# Patient Record
Sex: Female | Born: 1950
Health system: Southern US, Community
[De-identification: ages and names within clinical notes are randomized; demographics above are authoritative.]

## PROBLEM LIST (undated history)

## (undated) DIAGNOSIS — Z91018 Allergy to other foods: Secondary | ICD-10-CM

## (undated) DIAGNOSIS — F419 Anxiety disorder, unspecified: Secondary | ICD-10-CM

## (undated) DIAGNOSIS — I10 Essential (primary) hypertension: Secondary | ICD-10-CM

## (undated) DIAGNOSIS — J45909 Unspecified asthma, uncomplicated: Secondary | ICD-10-CM

## (undated) DIAGNOSIS — I2510 Atherosclerotic heart disease of native coronary artery without angina pectoris: Secondary | ICD-10-CM

## (undated) DIAGNOSIS — F32A Depression, unspecified: Secondary | ICD-10-CM

## (undated) DIAGNOSIS — F329 Major depressive disorder, single episode, unspecified: Secondary | ICD-10-CM

## (undated) DIAGNOSIS — M1990 Unspecified osteoarthritis, unspecified site: Secondary | ICD-10-CM

## (undated) DIAGNOSIS — K219 Gastro-esophageal reflux disease without esophagitis: Secondary | ICD-10-CM

## (undated) DIAGNOSIS — L509 Urticaria, unspecified: Secondary | ICD-10-CM

## (undated) DIAGNOSIS — R748 Abnormal levels of other serum enzymes: Secondary | ICD-10-CM

## (undated) DIAGNOSIS — C649 Malignant neoplasm of unspecified kidney, except renal pelvis: Secondary | ICD-10-CM

## (undated) DIAGNOSIS — E785 Hyperlipidemia, unspecified: Secondary | ICD-10-CM

## (undated) DIAGNOSIS — T7840XA Allergy, unspecified, initial encounter: Secondary | ICD-10-CM

## (undated) DIAGNOSIS — I070 Rheumatic tricuspid stenosis: Secondary | ICD-10-CM

## (undated) DIAGNOSIS — K635 Polyp of colon: Secondary | ICD-10-CM

## (undated) HISTORY — DX: Major depressive disorder, single episode, unspecified: F32.9

## (undated) HISTORY — PX: CYST REMOVAL TRUNK: SHX6283

## (undated) HISTORY — DX: Malignant neoplasm of unspecified kidney, except renal pelvis: C64.9

## (undated) HISTORY — PX: HERNIA REPAIR: SHX51

## (undated) HISTORY — DX: Allergy to other foods: Z91.018

## (undated) HISTORY — DX: Depression, unspecified: F32.A

## (undated) HISTORY — DX: Unspecified osteoarthritis, unspecified site: M19.90

## (undated) HISTORY — DX: Abnormal levels of other serum enzymes: R74.8

## (undated) HISTORY — DX: Polyp of colon: K63.5

## (undated) HISTORY — DX: Atherosclerotic heart disease of native coronary artery without angina pectoris: I25.10

## (undated) HISTORY — PX: CHOLECYSTECTOMY: SHX55

## (undated) HISTORY — PX: OTHER SURGICAL HISTORY: SHX169

## (undated) HISTORY — DX: Anxiety disorder, unspecified: F41.9

## (undated) HISTORY — PX: TUBAL LIGATION: SHX77

## (undated) HISTORY — PX: ABDOMINAL HYSTERECTOMY: SHX81

## (undated) HISTORY — DX: Unspecified asthma, uncomplicated: J45.909

## (undated) HISTORY — DX: Hyperlipidemia, unspecified: E78.5

## (undated) HISTORY — DX: Allergy, unspecified, initial encounter: T78.40XA

## (undated) HISTORY — DX: Essential (primary) hypertension: I10

## (undated) HISTORY — DX: Urticaria, unspecified: L50.9

## (undated) HISTORY — DX: Gastro-esophageal reflux disease without esophagitis: K21.9

## (undated) HISTORY — DX: Rheumatic tricuspid stenosis: I07.0

## (undated) HISTORY — PX: POLYPECTOMY: SHX149

---

## 1990-01-16 HISTORY — PX: NISSEN FUNDOPLICATION: SHX2091

## 2000-04-16 ENCOUNTER — Ambulatory Visit (HOSPITAL_COMMUNITY): Admission: RE | Admit: 2000-04-16 | Discharge: 2000-04-16 | Payer: Self-pay | Admitting: Gastroenterology

## 2003-07-13 ENCOUNTER — Ambulatory Visit (HOSPITAL_COMMUNITY): Admission: RE | Admit: 2003-07-13 | Discharge: 2003-07-13 | Payer: Self-pay | Admitting: Family Medicine

## 2003-10-21 ENCOUNTER — Ambulatory Visit (HOSPITAL_COMMUNITY): Admission: RE | Admit: 2003-10-21 | Discharge: 2003-10-21 | Payer: Self-pay | Admitting: Family Medicine

## 2003-10-22 ENCOUNTER — Ambulatory Visit (HOSPITAL_COMMUNITY): Admission: RE | Admit: 2003-10-22 | Discharge: 2003-10-22 | Payer: Self-pay | Admitting: Family Medicine

## 2003-11-25 ENCOUNTER — Ambulatory Visit (HOSPITAL_COMMUNITY): Admission: RE | Admit: 2003-11-25 | Discharge: 2003-11-25 | Payer: Self-pay | Admitting: Internal Medicine

## 2004-05-25 ENCOUNTER — Ambulatory Visit (HOSPITAL_COMMUNITY): Admission: RE | Admit: 2004-05-25 | Discharge: 2004-05-25 | Payer: Self-pay | Admitting: *Deleted

## 2004-08-24 ENCOUNTER — Encounter: Admission: RE | Admit: 2004-08-24 | Discharge: 2004-08-24 | Payer: Self-pay | Admitting: Cardiology

## 2005-01-30 ENCOUNTER — Ambulatory Visit: Payer: Self-pay | Admitting: Internal Medicine

## 2005-02-07 ENCOUNTER — Encounter: Admission: RE | Admit: 2005-02-07 | Discharge: 2005-02-07 | Payer: Self-pay | Admitting: *Deleted

## 2005-02-08 ENCOUNTER — Ambulatory Visit (HOSPITAL_COMMUNITY): Admission: RE | Admit: 2005-02-08 | Discharge: 2005-02-08 | Payer: Self-pay | Admitting: Internal Medicine

## 2005-02-08 ENCOUNTER — Ambulatory Visit: Payer: Self-pay | Admitting: Internal Medicine

## 2005-02-08 HISTORY — PX: COLONOSCOPY: SHX174

## 2005-02-09 ENCOUNTER — Ambulatory Visit (HOSPITAL_COMMUNITY): Admission: RE | Admit: 2005-02-09 | Discharge: 2005-02-09 | Payer: Self-pay | Admitting: Internal Medicine

## 2005-03-28 ENCOUNTER — Ambulatory Visit: Payer: Self-pay | Admitting: Internal Medicine

## 2005-08-08 ENCOUNTER — Encounter: Admission: RE | Admit: 2005-08-08 | Discharge: 2005-08-08 | Payer: Self-pay | Admitting: *Deleted

## 2005-10-11 ENCOUNTER — Ambulatory Visit (HOSPITAL_BASED_OUTPATIENT_CLINIC_OR_DEPARTMENT_OTHER): Admission: RE | Admit: 2005-10-11 | Discharge: 2005-10-11 | Payer: Self-pay | Admitting: Orthopedic Surgery

## 2006-05-24 ENCOUNTER — Encounter: Admission: RE | Admit: 2006-05-24 | Discharge: 2006-05-24 | Payer: Self-pay | Admitting: *Deleted

## 2007-06-10 ENCOUNTER — Ambulatory Visit: Payer: Self-pay | Admitting: Internal Medicine

## 2007-06-10 ENCOUNTER — Inpatient Hospital Stay (HOSPITAL_COMMUNITY): Admission: EM | Admit: 2007-06-10 | Discharge: 2007-06-12 | Payer: Self-pay | Admitting: Emergency Medicine

## 2007-06-28 ENCOUNTER — Encounter: Admission: RE | Admit: 2007-06-28 | Discharge: 2007-06-28 | Payer: Self-pay | Admitting: *Deleted

## 2008-09-23 ENCOUNTER — Encounter: Admission: RE | Admit: 2008-09-23 | Discharge: 2008-09-23 | Payer: Self-pay | Admitting: *Deleted

## 2009-10-08 ENCOUNTER — Encounter: Admission: RE | Admit: 2009-10-08 | Discharge: 2009-10-08 | Payer: Self-pay | Admitting: *Deleted

## 2010-05-31 NOTE — Discharge Summary (Signed)
NAMEMarland Kitchen  Leah [name], Leah [name] NO.:  [id number]   MEDICAL RECORD NO.:  [id number]          PATIENT TYPE:  INP   LOCATION:  5014                         FACILITY:  [facility]   PHYSICIAN:  Madaline Guthrie, M.D.    DATE OF BIRTH:  Aug 28, 1950   DATE OF ADMISSION:  06/10/2007  DATE OF DISCHARGE:  06/12/2007                               DISCHARGE SUMMARY   DISCHARGE DIAGNOSES:  1. Lower gastrointestinal bleeding in the background of internal      hemorrhoids, diverticulosis, and chronic constipation with      otherwise normal colonoscopy (in January 2007)  2. History of asthma.  3. Hyperlipidemia.  4. Hypertension.  5. Gastroesophageal reflux disease.  6. History of hysterectomy.  7. History of cholecystectomy.  8. History of rectal fistula repair.  9. History of coccydynia status post manipulation and injection in      September 2007.  10.History of Nissen fundoplication in 1994.  11.History of right carpal tunnel release.  12.History of hernia repair with mesh in 2007.  13.Hypokalemia, resolved.  14.Liver enzyme dysfunction, resolved with negative acute hepatic      panel.  15.Vasovagal, situational syncope.   DISCHARGE MEDICATIONS:  1. MiraLax 17 grams daily.  2. Aspirin 81 mg daily.  3. Protonix 40 mg daily.  4. Crestor 40 mg daily.   DISPOSITION AND FOLLOWUP:  The patient is sent home in a stable  condition.  The patient will follow with [facility] on June 11 at 3:15.  At the followup visit, the patient will be  reviewed for resolution of her bleeding problem.  A repeat CBC and BMET  is advised at the followup visit.  The patient was noted to have  borderline hypokalemia on the day of discharge and this needs to be  checked.  A repeat liver function test is also recommended at the  followup visit as the patient was found to have transient elevation of  her liver enzymes which were trending down.   PROCEDURES:  CT angiogram of abdomen  and pelvis, Jun 11, 2007.   FINDINGS:  1. Diffuse low density wall thickening involving the descending colon      compatible with colitis.  This could be infectious, inflammatory,      or ischemic in nature.  2. A 2.1 cm ventral hernia defect with herniated fat measuring 7 x 2.5      cm.  3. Stable proximal gastric wall thickening.  4. No acute pelvic abnormality.   CONSULTATIONS:  Iva Boop, MD, [facility].   ADMISSION HISTORY:  Ms. Kerwin is a 60 year old woman with past medical  history of chronic constipation, GERD, history of rectal fistula repair,  history of Nissen fundoplication, and history of esophageal polyp  removed about 3 months ago.  She presented to ED complaining of rectal  bleeding and syncopal episode.  She mentioned of constipation for the  past 3-4 days prior to admission.  She had an urge to defecate after she  took Dulcolax early in the morning on the day of admission.  She was  unable  to defecate and she had to push and strain hard and at that  moment, she felt lightheadedness, diaphoresis, and abdominal pain.  Right after defecation patient called her husband and subsequently  passed out in his arms. The EMS was called and she was asked to try  enema.  After that, she had a bowel movement but noticed bright red  bleeding per rectum of about half a cup.  She continued to have a total  of 11 bowel motions containing blood and clots.  She mentioned [place] that  given the pain started 2 days prior to admission, it was crampy  bilateral lower quadrant pain, intermittent in nature, alleviated by  bowel motion.  She also mentioned of epigastric pain aggravated by food  and burning sensation.   ADMISSION PHYSICAL EXAMINATION:  VITAL SIGNS:  Temperature 98.8, blood  pressure 150/91, pulse 73, respiratory rate 18, oxygen saturation 100%  on room air.  GENERAL:  Uncomfortable, morbidly obese.  EYES:  PERRLA, intact range of motion.  ENT:  Moist  mucous.  NECK:  No JVD.  No thyromegaly.  CHEST:  Clear to auscultation bilaterally.  No crackles.  CARDIOVASCULAR:  Normal heart sounds with no murmur, regular rate and  rhythm.  ABDOMEN:  Bowel sounds positive, no distention, obese, vertical midline  scar, and laparoscopic cholecystectomy scar.  Minimal diffuse upper  quadrant tenderness with moderate bilateral lower quadrant tenderness to  palpation with no rebound tenderness, no rigidity, or guarding.  EXTREMITIES:  No edema.  GENITOURINARY:  Positive suprapubic tenderness.  SKIN:  Clear but clammy.  NEURO:  Alert and oriented x3.  Cranial nerves III-XII intact.  Some  nystagmus on lateral gaze.  Slight past-pointing on cerebellar exam.  PSYCH:  Alert and oriented x3, pleasant.   ADMISSION LABORATORY DATA:  Sodium 136, potassium 3.5, chloride 99,  bicarbonate 30, BUN 11, creatinine 0.9, blood glucose 101, calcium 9.5,  ALT 36, AST 27, protein 6.8, albumin 3.8.  Hemoglobin 13.3, platelet  289, WBC 8.5.  INR 0.9.  PT 12.1.  APTT 28.  Urinalysis negative.   HOSPITAL COURSE:  1. Bright red bleeding per rectum.  The patient was admitted for      observation and further workup on her problem.  She was placed      n.p.o., given IV fluids and p.r.n., and analgesics.  Her hemoglobin      and hematocrit were monitored closely.  It did drop down to 11.6,      but then stabilized.  A CT angio of the abdomen was obtained as      mentioned above to rule out any ongoing acute abdominal issues,      including ischemic colitis.  Gastroenterology consult was made with      Dr. Leone Payor who was of the opinion that the patient's bleeding per      rectum was most likely related to the problem of chronic      constipation and straining and no further investigations were      recommended at this time.  Upon Dr. Marvell Fuller recommendation, the      patient was started on MiraLax.  The patient remained stable during      her hospital stay.  She did pass a  few bright red and blackish      colored stools, but it was subsiding and her pain in abdomen had      greatly diminished by day 2.  2. Syncope.  This was most likely vasovagal syncope  related to      situation.  This occurred following defecation and hence most      likely is a defecation related situational syncope.  We simply      monitored her in the hospital and placed her on telemetry.  She      remained stable in terms of this particular problem.  Her cardiac      enzymes were negative x2 and there were no acute changes on her      electrocardiogram.  3. Transient elevation in liver enzymes.  We simply monitored her      liver functions and obtained an acute hepatitis panel which came      out to be negative.  4. Hypertension.  The patient's blood pressure medication was      initially placed on hold and restarted upon discharge.  5. Hyperlipidemia.  The patient's regular medication of statin was      continued.  Initially she was given Zocor but then after noticing      high liver enzymes, it was changed to her regular medicine of      Crestor.  This can be monitored on outpatient basis.  6. Gastroesophageal reflux disease.  Her regular PPI was continued.  7. Asthma.  This was stable.  She was on replaced on p.r.n. meds.   DISCHARGE LABORATORY DATA:  Hemoglobin 11.6, WBC 5.6, MCV 81.8, platelet  220.  Sodium 142, potassium 3.2, chloride 105, bicarbonate 27, glucose  98, BUN 10, creatinine 0.94.  Bilirubin 0.4, alk phos 70, AST 38, ALT  70, protein 5.7, albumin 2.9, calcium 8.5.   DISCHARGE VITAL SIGNS:  Temperature 99.2, pulse 80, respirations 20,  blood pressure 113/74, saturation 98% on room air.   On the day of discharge, the patient's bleeding problem had greatly  subsided with passage of minimal blackish stool.  Her abdomen  examination was completely benign and she was no longer complaining of  any abdominal pain.      Zara Council, MD  Electronically  Signed      Madaline Guthrie, M.D.  Electronically Signed    AS/MEDQ  D:  06/13/2007  T:  06/13/2007  Job:  [id number]   cc:   Iva Boop, MD,FACG

## 2010-06-03 NOTE — Op Note (Signed)
NAME:  Leah [name], Leah [name] NO.:  [id number]   MEDICAL RECORD NO.:  [id number]          PATIENT TYPE:  AMB   LOCATION:  DAY                           FACILITY:  [facility]   PHYSICIAN:  R. Roetta Sessions, M.D. DATE OF BIRTH:  1950/09/16   DATE OF PROCEDURE:  02/08/2005  DATE OF DISCHARGE:                                 OPERATIVE REPORT   PROCEDURE:  Diagnostic colonoscopy.   ENDOSCOPIST:  Gerrit Friends. Rourk, M.D.   INDICATIONS FOR PROCEDURE:  The patient is a 60 year old lady with pain just  below the tip of her coccyx.  She is chronically constipated.  She has never  had her lower GI tract imaged.  MRI of his coccyx is reportedly negative  recently.  Colonoscopy is now being done.  This approach has been discussed  with the patient at length.  The potential risks, benefits, and alternatives  have been reviewed; questions answered.  She is agreeable. Please see the  documentation in the medical record.   PROCEDURE NOTE:  O2 saturation, blood pressure, pulse and respirations were  monitored throughout the entire procedure.   CONSCIOUS SEDATION:  Versed 3 mg IV, Demerol 75 mg IV in divided doses.   INSTRUMENT:  Olympus video chip system.   FINDINGS:  A digital rectal exam revealed a fairly good sphincter tone.  No  mass in the rectal vault, no unusual tenderness or abnormality.   ENDOSCOPIC FINDINGS:  The prep was good.   RECTUM:  Examination of the rectal mucosa including the retroflex view of  the anal verge revealed no abnormalities.   COLON:  The colonic mucosa was surveyed from the rectosigmoid junction  through the left transverse and right colon to the area of the appendiceal  orifice, ileocecal valve, and cecum.  These structures were well seen and  photographed for the record.   From this level the scope was slowly and cautiously withdrawn.  All  previously mentioned mucosal surfaces were again seen. The patient had a few  scattered, shallow sigmoid  diverticula.  The remainder of the colonic mucosa  appeared normal.  The patient tolerated the procedure well and was reacted  in endoscopy.  Cecal withdrawal time 8 minutes.   IMPRESSION:  1.  Internal hemorrhoids otherwise normal rectum.  2.  A few scattered, shallow, left-sided sigmoid diverticula.  3.  The remainder of the colonic mucosa appeared normal.   RECOMMENDATIONS:  Provide Ms. Towns with constipation literature since she  has had problems with that for some time.  Recommended a daily fiber  supplement.  Crystalase laxative 20 gm orally at bed time to see if this  helps her constipation.  We would like to see it would help her perianal  pain as well.  Will go ahead and give her a brief of __________  forte cream  to apply to the anorectum t.i.d., also to see if this makes any difference.  I think that we should go ahead and proceed with abdominal pelvic CT just to  make sure that she does not have an occult cyst or a smoldering abscess  (both  of which I think are less likely).  Will make further recommendations  in the very near future.      Jonathon Bellows, M.D.  Electronically Signed     RMR/MEDQ  D:  02/08/2005  T:  02/08/2005  Job:  [id number]   cc:   Smitty Cords L. Bess Harvest, D.O.  [place]

## 2010-06-03 NOTE — Op Note (Signed)
NAME:  Leah [name], Leah [name] NO.:  [id number]   MEDICAL RECORD NO.:  [id number]          PATIENT TYPE:  AMB   LOCATION:  DSC                          FACILITY:  [facility]   PHYSICIAN:  Harvie Junior, M.D.   DATE OF BIRTH:  [birth year]   DATE OF PROCEDURE:  DATE OF DISCHARGE:                                 OPERATIVE REPORT   DATE OF SURGERY:  10/11/2005   PREOPERATIVE DIAGNOSES:  1. Carpal tunnel syndrome, right.  2. Coccydynia.   POSTOPERATIVE DIAGNOSIS:  1. Carpal tunnel syndrome, right.  2. Coccydynia.   PROCEDURE:  1. Right carpal tunnel release.  2. Manipulation of coccyx manually through the rectum.  3. Injection into the coccyx area with aid of Xylocaine and 2 mL of Depo-      Medrol.   SURGEON:  Harvie Junior, M.D.   ASSISTANT:  Marshia Ly, P.A.   ANESTHESIA:  General.   BRIEF HISTORY:  Leah [name] is a 60 year old female with a long history of  having bilateral carpal tunnel syndrome diagnosed by EMG.  She ultimately  was evaluated and felt to need carpal tunnel release.  In preoperative  discussions, the patient had also discussed having a 2 year history of  coccyx pain and we discussed treatment options including injection.  She  ultimately felt that she would like some treatment for this and, give that  we were going to have her asleep for surgery, we felt that injection with  manipulation would be appropriate if we could do the most we could to try to  alleviate her symptoms in that area.  She was brought to the operating room  for that procedure.   PROCEDURE:  The patient brought to the operating room. After adequate  anesthesia was obtained with general anesthetic, the patient placed supine  on the operating table.  She was then rolled in the left lateral decubitus  position.  At this point Surg-I-Loop was used and a rectal examination was  performed with getting up to the area of the coccyx.  Once we were able to  get to the area of the  coccyx, from inside the rectum and outside the skin,  we were able to manipulate the coccyx.  It was very stiff and really had  quite little mobility. We provided additional pressure to see if we could  not get that to move and we were moderately successful with that.  At that  point, this portion of the procedure was halted.  The patient was wiped and  cleaned and the area that was marked preoperatively as her area of maximal  tenderness was then prepped thoroughly and 6 mL of Marcaine and 2 mL of Depo-  Medrol were placed in this area.  We used a spinal needle and got right down  on the coccyx and you could feel the bony area in that area and then did the  injection right along the posterior aspect of the coccyx.   Once this was accomplished, this area was cleansed as well and the patient  was then rolled onto her back and  a right carpal tunnel release was  performed by placing a forearm tourniquet, prepping and draping the hand.  Then at this point a small incision was made just ulnar to the midline  wrist.  Subcutaneous tissue was dissected down to the volar carpal ligament.  The volar carpal ligament is clearly identified and then divided both  proximally and distally.  There was a tremendous amount of synovial tissue  and the median nerve did seem to be somewhat crowded.  At this point  somewhat of a synovectomy was undertaken to allow the nerve to be freed up.  This gave excellent room for the nerve at this point.  At this point the  wound was copiously irrigated and suctioned dry.  Gloved finger was placed  in the wound proximally and distally ensuring that the nerve was freed up.  After this, the hand wound was closed with interrupted and running suture.  A sterile compressive dressing was applied as well as a volar plaster. The  patient was taken to the recovery room where she was noted to be in  satisfactory condition.  The estimated blood loss for the procedure was   none.      Harvie Junior, M.D.  Electronically Signed     JLG/MEDQ  D:  10/11/2005  T:  10/11/2005  Job:  [id number]

## 2010-06-03 NOTE — Consult Note (Signed)
NAME:  Leah [name], Leah [name] NO.:  [id number]   MEDICAL RECORD NO.:  [id number]          PATIENT TYPE:  AMB   LOCATION:  DAY                           FACILITY:  [facility]   PHYSICIAN:  R. Roetta Sessions, M.D. DATE OF BIRTH:  03/16/1950   DATE OF CONSULTATION:  01/30/2005  DATE OF DISCHARGE:                                   CONSULTATION   REASON FOR CONSULTATION:  Pain in the perirectal area.   HISTORY:  This patient is a pleasant 60 year old African-American female  sent to me by Dr. Smitty Cords L. [place] to further  evaluate the above-mentioned symptoms.  The patient tells me that she has  had just above her anus at the end of her tail bone for approximately 1 year  ago. It has been insidiously progressive.  She has to use a donut cushion  even to drive at this time. She has never had any discharge from the skin  from around her anus.  There has been no history of trauma.  She did undergo  an MRI of her sacrum and coccyx which reportedly was negative (done in  [place]).  I do have a CD-ROM of that study to review.   This patient is chronically constipated.  She may have a bowel movement  every third day or so.  Often times she never gets an urge to have a good  bowel movement and just has to go to the toilet to try and evacuate.  She  has never passed any blood per rectum.  She does not have any itching,  burning, or lancinating pain at the time of a bowel movement.  The pain  above her anus does not worsen with a bowel movement.  She has never had her  lower GI tract imaged in terms of air-contrast barium enema or colonoscopy.  There is no family history of colorectal neoplasia.   She has not had any associated abdominal pain.  There has been no change in  her weight.  She has some intermittent gastroesophageal reflux disease  symptoms for which she takes Nexium in spite of having a Nissen  fundoplication in [place] back in 1991.  She  rarely has dysphagia to  solids.   PAST MEDICAL HISTORY:  1.  Significant for asthma.  2.  Hypertension.  3.  History of GERD.  4.  Hyperlipidemia.   PAST SURGERY:  1.  Hysterectomy.  2.  Nissen fundoplication.  3.  Cholecystectomy.  4.  She tells me that she had a rectal fistula repair years ago, as well.   CURRENT MEDICATIONS:  1.  Lisinopril 20 mg daily.  2.  Black coat hash once daily.  3.  ASA 81 mg daily.  4.  Garlic once daily.  5.  Crestor 20 mg daily.  6.  Nexium 40 mg p.r.n.   ALLERGIES:  No known drug allergies.   FAMILY HISTORY:  Father died at age 47 with some type of GI problem, but  there was no malignancy.  Mother is alive in good health.  Two sisters and  two  brothers alive and in good health.   SOCIAL HISTORY:  The patient is married, has three children.  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  She stopped smoking  10 years ago.  She rarely consumes a glass of wine.  She was born in  [place], [place], lived most of her life in [place], moved to  [place], and finally relocated to [place], and subsequently [place],  [place] where the pace was slower.  There is no history of illicit  drug use.   REVIEW OF SYSTEMS:  No chest pain, no dyspnea on exertion.  Occasional  reflux symptoms.  Rare esophageal dysphagia.  Status post Nissen  fundoplication. No fever or chills.   PHYSICAL EXAMINATION:  GENERAL:  Reveals a pleasant, 60 year old lady  resting comfortably.  VITAL SIGNS:  Weight 196.5, height 5 feet 3-1/2 inches.  Temperature 97.7,  BP 126/86, pulse 72.  SKIN:  Warm and dry.  HEENT:  No scleral icterus.  NECK:  JVD is not prominent or __________ lesions.  CHEST:  Lungs are clear to auscultation.  CARDIAC:  Regular rate and rhythm without murmur, gallop, or rub.  BREASTS EXAM:  Deferred.  ABDOMEN:  Obese.  Laparotomy scar is well healed.  Positive bowel sounds.  Soft, nontender, without appreciable  mass or organomegaly.  EXTREMITIES:  No edema.  RECTAL:  On examination of the buttock and sacral area I do not see any  cutaneous abnormalities.  She does point to just above her anus as an area  of point tenderness.  This is just below the tip of her coccyx.  Her coccyx,  itself does not appear to be tender to me.  A digital rectal exam revealed  quite a bit of stool in the rectal vault (patient does not have an urge to  evacuate currently).  Fair sphincter tone.  No appreciable mass in the  rectal vault.  Brown stool is Hemoccult negative.  It is notable that  palpation of the superior anal/rectal wall is a bit more tender than other  quadrants.   IMPRESSION:  This patient is a 60 year old lady with a 1-year history of  perianal coccygeal pain.  Reportedly MRI of her coccyx and sacrum are  negative.  She has a history of chronic constipation and does appear to have  soft tissue, point tenderness on the superior aspect of the anus.  I do not  appreciate any fluctuance or mass to suggest a cyst or abscess.   Her distant history of having a rectal fistula repair is interesting.   Although her symptoms are somewhat atypical she may have an occult fissure  to account for her symptoms, and I suspect an occult cyst is not excluded at  this time.  She has never had her lower GI tract imaged.   I told the patient that at this point we would just go ahead and plan to  directly visualize her rectum with colonoscopy as the next maneuver.  The  potential risks, benefits, and alternatives have been reviewed.  I told the  patient that if the colonoscopy was unrevealing for her abdominal pain she  may need further evaluation in the way of a pelvic CT or  even possibly a rectal ultrasound in the very near future.  I would like to  thank Dr. Rockwell Germany over in [place], [place] for allowing me to see this very nice lady today.  We will get back with him once more  information has been  gathered.      Jonathon Bellows, M.D.  Electronically Signed     RMR/MEDQ  D:  01/30/2005  T:  01/30/2005  Job:  [id number]   cc:   Smitty Cords L. Bess Harvest, D.O.  [place]   [place]

## 2010-10-12 LAB — DIFFERENTIAL
Basophils Absolute: 0
Basophils Absolute: 0
Basophils Relative: 0
Basophils Relative: 1
Eosinophils Absolute: 0
Eosinophils Absolute: 0.1
Eosinophils Relative: 0
Eosinophils Relative: 1
Lymphocytes Relative: 10 — ABNORMAL LOW
Lymphocytes Relative: 25
Lymphs Abs: 0.8
Lymphs Abs: 1.5
Monocytes Absolute: 0.4
Monocytes Absolute: 0.5
Monocytes Relative: 6
Monocytes Relative: 6
Neutro Abs: 4.1
Neutro Abs: 7.1
Neutrophils Relative %: 68
Neutrophils Relative %: 83 — ABNORMAL HIGH

## 2010-10-12 LAB — TYPE AND SCREEN
ABO/RH(D): O NEG
Antibody Screen: NEGATIVE

## 2010-10-12 LAB — CK TOTAL AND CKMB (NOT AT ARMC)
CK, MB: 1
Relative Index: 0.8
Total CK: 133

## 2010-10-12 LAB — CBC
HCT: 34.3 — ABNORMAL LOW
HCT: 36.9
HCT: 37.2
HCT: 39
HCT: 39.5
Hemoglobin: 11.6 — ABNORMAL LOW
Hemoglobin: 12.5
Hemoglobin: 12.5
Hemoglobin: 13.1
Hemoglobin: 13.3
MCHC: 33.5
MCHC: 33.6
MCHC: 33.7
MCHC: 33.8
MCHC: 33.9
MCV: 81.4
MCV: 81.5
MCV: 81.8
MCV: 81.9
MCV: 82.1
Platelets: 220
Platelets: 228
Platelets: 246
Platelets: 264
Platelets: 289
RBC: 4.19
RBC: 4.54
RBC: 4.55
RBC: 4.75
RBC: 4.84
RDW: 13.7
RDW: 13.8
RDW: 13.8
RDW: 13.9
RDW: 14
WBC: 4.9
WBC: 5.6
WBC: 6
WBC: 7.3
WBC: 8.5

## 2010-10-12 LAB — HEPATITIS PANEL, ACUTE
HCV Ab: NEGATIVE
Hep A IgM: NEGATIVE
Hep B C IgM: NEGATIVE
Hepatitis B Surface Ag: NEGATIVE

## 2010-10-12 LAB — URINALYSIS, ROUTINE W REFLEX MICROSCOPIC
Bilirubin Urine: NEGATIVE
Glucose, UA: NEGATIVE
Hgb urine dipstick: NEGATIVE
Ketones, ur: NEGATIVE
Nitrite: NEGATIVE
Protein, ur: NEGATIVE
Specific Gravity, Urine: 1.006
Urobilinogen, UA: 0.2
pH: 7

## 2010-10-12 LAB — LIPID PANEL
Cholesterol: 132
HDL: 38 — ABNORMAL LOW
LDL Cholesterol: 74
Total CHOL/HDL Ratio: 3.5
Triglycerides: 99
VLDL: 20

## 2010-10-12 LAB — COMPREHENSIVE METABOLIC PANEL
ALT: 108 — ABNORMAL HIGH
ALT: 36 — ABNORMAL HIGH
ALT: 70 — ABNORMAL HIGH
AST: 124 — ABNORMAL HIGH
AST: 27
AST: 38 — ABNORMAL HIGH
Albumin: 2.9 — ABNORMAL LOW
Albumin: 3.3 — ABNORMAL LOW
Albumin: 3.8
Alkaline Phosphatase: 70
Alkaline Phosphatase: 81
Alkaline Phosphatase: 84
BUN: 10
BUN: 11
BUN: 9
CO2: 27
CO2: 30
CO2: 30
Calcium: 8.5
Calcium: 9.1
Calcium: 9.5
Chloride: 102
Chloride: 105
Chloride: 99
Creatinine, Ser: 0.9
Creatinine, Ser: 0.94
Creatinine, Ser: 1.13
GFR calc Af Amer: >60
GFR calc Af Amer: >60
GFR calc Af Amer: >60
GFR calc non Af Amer: 50 — ABNORMAL LOW
GFR calc non Af Amer: >60
GFR calc non Af Amer: >60
Glucose, Bld: 101 — ABNORMAL HIGH
Glucose, Bld: 112 — ABNORMAL HIGH
Glucose, Bld: 98
Potassium: 3.2 — ABNORMAL LOW
Potassium: 3.5
Potassium: 3.8
Sodium: 136
Sodium: 141
Sodium: 142
Total Bilirubin: 0.4
Total Bilirubin: 0.5
Total Bilirubin: 0.7
Total Protein: 5.7 — ABNORMAL LOW
Total Protein: 6.3
Total Protein: 6.8

## 2010-10-12 LAB — CARDIAC PANEL(CRET KIN+CKTOT+MB+TROPI)
CK, MB: 1
CK, MB: 1
Relative Index: 0.9
Relative Index: INVALID
Total CK: 116
Total CK: 99
Troponin I: 0.01
Troponin I: <0.01

## 2010-10-12 LAB — APTT: aPTT: 28

## 2010-10-12 LAB — LACTATE DEHYDROGENASE: LDH: 133

## 2010-10-12 LAB — LACTIC ACID, PLASMA: Lactic Acid, Venous: 1.3

## 2010-10-12 LAB — TSH: TSH: 0.869

## 2010-10-12 LAB — HEMOGLOBIN A1C
Hgb A1c MFr Bld: 6.1
Mean Plasma Glucose: 140

## 2010-10-12 LAB — TROPONIN I: Troponin I: 0.01

## 2010-10-12 LAB — LIPASE, BLOOD: Lipase: 15

## 2010-10-12 LAB — ABO/RH: ABO/RH(D): O NEG

## 2010-10-12 LAB — PROTIME-INR
INR: 0.9
Prothrombin Time: 12.1

## 2010-11-09 ENCOUNTER — Other Ambulatory Visit: Payer: Self-pay | Admitting: *Deleted

## 2010-11-09 DIAGNOSIS — Z1231 Encounter for screening mammogram for malignant neoplasm of breast: Secondary | ICD-10-CM

## 2010-12-02 ENCOUNTER — Ambulatory Visit: Payer: Self-pay

## 2011-04-26 ENCOUNTER — Other Ambulatory Visit: Payer: Self-pay | Admitting: *Deleted

## 2011-04-26 DIAGNOSIS — Z1231 Encounter for screening mammogram for malignant neoplasm of breast: Secondary | ICD-10-CM

## 2011-05-10 ENCOUNTER — Ambulatory Visit
Admission: RE | Admit: 2011-05-10 | Discharge: 2011-05-10 | Disposition: A | Discharge status: Home / Self Care | Payer: BC Managed Care – PPO | Source: Ambulatory Visit | Attending: *Deleted | Admitting: *Deleted

## 2011-05-10 DIAGNOSIS — Z1231 Encounter for screening mammogram for malignant neoplasm of breast: Secondary | ICD-10-CM

## 2012-03-15 ENCOUNTER — Other Ambulatory Visit (HOSPITAL_COMMUNITY): Payer: Self-pay | Admitting: Family Medicine

## 2012-03-15 DIAGNOSIS — R109 Unspecified abdominal pain: Secondary | ICD-10-CM

## 2012-03-20 ENCOUNTER — Ambulatory Visit (HOSPITAL_COMMUNITY)
Admission: RE | Admit: 2012-03-20 | Discharge: 2012-03-20 | Disposition: A | Discharge status: Home / Self Care | Payer: BC Managed Care – PPO | Source: Ambulatory Visit | Attending: Family Medicine | Admitting: Family Medicine

## 2012-03-20 DIAGNOSIS — R109 Unspecified abdominal pain: Secondary | ICD-10-CM | POA: Insufficient documentation

## 2012-04-03 ENCOUNTER — Other Ambulatory Visit (INDEPENDENT_AMBULATORY_CARE_PROVIDER_SITE_OTHER): Payer: BC Managed Care – PPO

## 2012-04-03 DIAGNOSIS — R7989 Other specified abnormal findings of blood chemistry: Secondary | ICD-10-CM

## 2012-04-03 LAB — IRON AND TIBC
%SAT: 23 % (ref 20–55)
Iron: 78 ug/dL (ref 42–145)
TIBC: 333 ug/dL (ref 250–470)
UIBC: 255 ug/dL (ref 125–400)

## 2012-04-03 LAB — VITAMIN B12: Vitamin B-12: 319 pg/mL (ref 211–911)

## 2012-04-03 LAB — FERRITIN: Ferritin: 97 ng/mL (ref 10–291)

## 2012-04-03 NOTE — Progress Notes (Signed)
Patient here for labs only. 

## 2012-04-17 ENCOUNTER — Telehealth: Payer: Self-pay | Admitting: Family Medicine

## 2012-04-17 NOTE — Telephone Encounter (Signed)
appt made for tomorrow

## 2012-04-18 ENCOUNTER — Ambulatory Visit (INDEPENDENT_AMBULATORY_CARE_PROVIDER_SITE_OTHER): Payer: BC Managed Care – PPO | Admitting: Physician Assistant

## 2012-04-18 ENCOUNTER — Encounter: Payer: Self-pay | Admitting: Physician Assistant

## 2012-04-18 VITALS — BP 144/95 | HR 61 | Temp 97.8°F | Ht 65.0 in | Wt 195.6 lb

## 2012-04-18 DIAGNOSIS — T148 Other injury of unspecified body region: Secondary | ICD-10-CM

## 2012-04-18 DIAGNOSIS — W57XXXA Bitten or stung by nonvenomous insect and other nonvenomous arthropods, initial encounter: Secondary | ICD-10-CM

## 2012-04-18 MED ORDER — DOXYCYCLINE HYCLATE 100 MG PO TABS: 100.0000 mg | ORAL_TABLET | Freq: Two times a day (BID) | ORAL | Status: DC

## 2012-04-18 NOTE — Progress Notes (Signed)
  Subjective:    Patient ID: Leah [name], female    DOB: October 18, 1950, 62 y.o.   MRN: [id number]  HPI Removed a tick yesterday, thinks she got it while chasing her dog thru the woods last week; felt achy and weak for several days, then sx settled down    Review of Systems  Constitutional: Positive for fatigue.  Musculoskeletal: Positive for arthralgias.  Neurological: Positive for weakness.  All other systems reviewed and are negative.       Objective:   Physical Exam  Vitals reviewed. Constitutional: She is oriented to person, place, and time. She appears well-developed and well-nourished.  HENT:  Head: Normocephalic and atraumatic.  Neurological: She is alert and oriented to person, place, and time.  Skin: Skin is warm and dry. She is not diaphoretic. There is erythema.  8mm lesion left 3:00 outer quad of left breast almost to the flank/axillary wall Area is pruritic  Psychiatric: She has a normal mood and affect. Her behavior is normal. Judgment and thought content normal.      Tick bite - Plan: doxycycline (VIBRA-TABS) 100 MG tablet, B. Burgdorfi Antibodies, [place] spotted fvr ab, IgM-blood      Assessment & Plan:

## 2012-04-19 LAB — B. BURGDORFI ANTIBODIES: B burgdorferi Ab IgG+IgM: 0.37 {ISR}

## 2012-04-19 LAB — ROCKY MTN SPOTTED FVR AB, IGM-BLOOD: ROCKY MTN SPOTTED FEVER, IGM: 0.14 IV

## 2012-08-05 ENCOUNTER — Other Ambulatory Visit: Payer: Self-pay | Admitting: *Deleted

## 2012-08-05 MED ORDER — CEPHALEXIN 500 MG PO CAPS: 500.0000 mg | ORAL_CAPSULE | Freq: Three times a day (TID) | ORAL | Status: DC

## 2012-08-05 NOTE — Telephone Encounter (Signed)
Pt spoke with dr Christell Constant over the weekend and he requested this to be sent in today.

## 2012-08-12 ENCOUNTER — Telehealth: Payer: Self-pay | Admitting: *Deleted

## 2012-08-12 NOTE — Telephone Encounter (Signed)
Bjorn Loser if you could help me with this I would appreciate it Please write no after talking with patient regarding her asthma and her power

## 2012-08-12 NOTE — Telephone Encounter (Signed)
Pt needs letter for [facility] stating she should receive help for [employer] bill due to asthma Please advise

## 2012-10-08 ENCOUNTER — Ambulatory Visit (INDEPENDENT_AMBULATORY_CARE_PROVIDER_SITE_OTHER): Payer: BC Managed Care – PPO | Admitting: *Deleted

## 2012-10-08 DIAGNOSIS — Z23 Encounter for immunization: Secondary | ICD-10-CM

## 2012-10-22 ENCOUNTER — Encounter: Payer: Self-pay | Admitting: Family Medicine

## 2012-10-22 ENCOUNTER — Ambulatory Visit (INDEPENDENT_AMBULATORY_CARE_PROVIDER_SITE_OTHER): Payer: BC Managed Care – PPO | Admitting: Family Medicine

## 2012-10-22 ENCOUNTER — Ambulatory Visit (INDEPENDENT_AMBULATORY_CARE_PROVIDER_SITE_OTHER): Payer: BC Managed Care – PPO

## 2012-10-22 VITALS — BP 129/84 | HR 83 | Temp 98.7°F | Ht 65.0 in | Wt 197.0 lb

## 2012-10-22 DIAGNOSIS — J45909 Unspecified asthma, uncomplicated: Secondary | ICD-10-CM

## 2012-10-22 DIAGNOSIS — E785 Hyperlipidemia, unspecified: Secondary | ICD-10-CM

## 2012-10-22 DIAGNOSIS — R52 Pain, unspecified: Secondary | ICD-10-CM

## 2012-10-22 DIAGNOSIS — E559 Vitamin D deficiency, unspecified: Secondary | ICD-10-CM

## 2012-10-22 DIAGNOSIS — R1310 Dysphagia, unspecified: Secondary | ICD-10-CM

## 2012-10-22 DIAGNOSIS — M545 Low back pain, unspecified: Secondary | ICD-10-CM

## 2012-10-22 DIAGNOSIS — K219 Gastro-esophageal reflux disease without esophagitis: Secondary | ICD-10-CM

## 2012-10-22 DIAGNOSIS — Z86010 Personal history of colonic polyps: Secondary | ICD-10-CM

## 2012-10-22 DIAGNOSIS — R109 Unspecified abdominal pain: Secondary | ICD-10-CM

## 2012-10-22 NOTE — Patient Instructions (Addendum)
Continue current medications. Continue good therapeutic lifestyle changes.  Fall precautions discussed with patient. Follow up as planned and earlier as needed. Use  inhaler sample as directed to see if it helps asthma  We will arrange visit with gastroenterologist for endoscopy and colonoscopy

## 2012-10-22 NOTE — Progress Notes (Signed)
Subjective:    Patient ID: Leah [name], female    DOB: 05-28-1950, 62 y.o.   MRN: [id number]  HPI Pt here for follow up and management of chronic medical problems. Patient says that her asthma is stable and she uses both the nebulizer periodically and an albuterol inhaler. On an average she says she may use the albuterol inhaler 3 times weekly. She continues to have back pain worse at night worse with getting up in the morning which seems to radiate around to her abdomen. She also has a history of GERD and does have problems swallowing at times. She is pasty on her colonoscopy. She indicates that her bowel movements are okay that she has not noticed any blood in the stool. She had a negative FOBT in March 2014.    Patient Active Problem List   Diagnosis Date Noted  . Hyperlipidemia 10/22/2012  . GERD (gastroesophageal reflux disease) 10/22/2012  . Unspecified asthma(493.90) 10/22/2012   Outpatient Encounter Prescriptions as of 10/22/2012  Medication Sig Dispense Refill  . aspirin 81 MG tablet Take 81 mg by mouth daily.      . cholecalciferol (VITAMIN D) 1000 UNITS tablet Take 1,000 Units by mouth 2 (two) times daily.      Marland Kitchen escitalopram (LEXAPRO) 10 MG tablet Take 10 mg by mouth daily.      . fish oil-omega-3 fatty acids 1000 MG capsule Take 2 g by mouth daily.      Marland Kitchen lisinopril-hydrochlorothiazide (PRINZIDE,ZESTORETIC) 20-12.5 MG per tablet Take 1 tablet by mouth daily.      Marland Kitchen omeprazole (PRILOSEC) 20 MG capsule Take 20 mg by mouth daily.      . [DISCONTINUED] cephALEXin (KEFLEX) 500 MG capsule Take 1 capsule (500 mg total) by mouth 3 (three) times daily.  30 capsule  0  . [DISCONTINUED] doxycycline (VIBRA-TABS) 100 MG tablet Take 1 tablet (100 mg total) by mouth 2 (two) times daily.  20 tablet  0  . [DISCONTINUED] rosuvastatin (CRESTOR) 40 MG tablet Take 40 mg by mouth daily.       No facility-administered encounter medications on file as of 10/22/2012.    Review of Systems   Constitutional: Negative.   HENT: Negative.   Eyes: Negative.   Respiratory: Negative.   Cardiovascular: Negative.   Gastrointestinal: Positive for constipation.       Bad heartburn last 2 weeks  Endocrine: Negative.   Genitourinary: Negative.   Musculoskeletal: Positive for myalgias (body aches) and arthralgias (body aches).       Ct done in february abd/pelvis  Skin: Negative.   Allergic/Immunologic: Negative.   Neurological: Negative.   Hematological: Negative.   Psychiatric/Behavioral: Negative.        Objective:   Physical Exam  Nursing note and vitals reviewed. Constitutional: She is oriented to person, place, and time. She appears well-developed and well-nourished.  HENT:  Head: Normocephalic and atraumatic.  Right Ear: External ear normal.  Left Ear: External ear normal.  Nose: Nose normal.  Mouth/Throat: Oropharynx is clear and moist. No oropharyngeal exudate.  Eyes: Conjunctivae and EOM are normal. Right eye exhibits no discharge. Left eye exhibits no discharge. No scleral icterus.  Neck: Normal range of motion. Neck supple. No thyromegaly present.  Cardiovascular: Normal rate, regular rhythm, normal heart sounds and intact distal pulses.  Exam reveals no gallop and no friction rub.   No murmur heard. At 72 per minute  Pulmonary/Chest: Effort normal and breath sounds normal. No respiratory distress. She has no wheezes. She has  no rales.  Abdominal: Soft. Bowel sounds are normal. She exhibits no distension and no mass. There is tenderness. There is no rebound and no guarding.  Patient has a couple of scars on her epigastric and abdominal area. These are apparently from a ventral wall hernia and maybe also from a Nissan fundoplication in the past. She is tender in the epigastric area.  Musculoskeletal: Normal range of motion. She exhibits no edema and no tenderness.  Leg raising was good bilaterally. Hip abduction was good bilaterally  Lymphadenopathy:    She has no  cervical adenopathy.  Neurological: She is alert and oriented to person, place, and time. She has normal reflexes. No cranial nerve deficit.  Skin: Skin is warm and dry. No rash noted.  Psychiatric: She has a normal mood and affect. Her behavior is normal. Judgment and thought content normal.  She seems somewhat concerned regarding her persistent abdominal discomfort and her back pain   BP 129/84  Pulse 83  Temp(Src) 98.7 F (37.1 C) (Oral)  Ht 5\' 5"  (1.651 m)  Wt 197 lb (89.359 kg)  BMI 32.78 kg/m2  [facility] reading (PRIMARY) by  Dr. Christell Constant: LS-spine-minimal curvature, otherwise within                                      Assessment & Plan:   1. Hyperlipidemia   2. GERD (gastroesophageal reflux disease)   3. Unspecified asthma(493.90)   4. Vitamin D deficiency   5. Body aches   6. Low back pain   7. Abdominal pain    Orders Placed This Encounter  Procedures  . DG Lumbar Spine 2-3 Views    Standing Status: Future     Number of Occurrences: 1     Standing Expiration Date: 12/22/2013    Order Specific Question:  Reason for Exam (SYMPTOM  OR DIAGNOSIS REQUIRED)    Answer:  low back pain and aches    Order Specific Question:  Preferred imaging location?    Answer:  Internal  . Hepatic function panel    Standing Status: Future     Number of Occurrences: 1     Standing Expiration Date: 10/22/2013  . BMP8+EGFR    Standing Status: Future     Number of Occurrences: 1     Standing Expiration Date: 10/22/2013  . Vit D  25 hydroxy (rtn osteoporosis monitoring)    Standing Status: Future     Number of Occurrences: 1     Standing Expiration Date: 10/22/2013  . Lipid panel  . Arthritis Panel   Ambulatory referral to gastroenterologist for endoscopy and colonoscopy  Patient Instructions  Continue current medications. Continue good therapeutic lifestyle changes.  Fall precautions discussed with patient. Follow up as planned and earlier as needed. Use  inhaler sample as directed to  see if it helps asthma  We will arrange visit with gastroenterologist for endoscopy and colonoscopy    Nyra Capes MD

## 2012-10-23 LAB — BMP8+EGFR
BUN/Creatinine Ratio: 15 (ref 11–26)
BUN: 14 mg/dL (ref 8–27)
CO2: 31 mmol/L — ABNORMAL HIGH (ref 18–29)
Calcium: 9.5 mg/dL (ref 8.6–10.2)
Chloride: 102 mmol/L (ref 97–108)
Creatinine, Ser: 0.95 mg/dL (ref 0.57–1.00)
GFR calc Af Amer: 75 mL/min/{1.73_m2} (ref >59)
GFR calc non Af Amer: 65 mL/min/{1.73_m2} (ref >59)
Glucose: 88 mg/dL (ref 65–99)
Potassium: 4.2 mmol/L (ref 3.5–5.2)
Sodium: 143 mmol/L (ref 134–144)

## 2012-10-23 LAB — LIPID PANEL
Chol/HDL Ratio: 5.2 ratio units — ABNORMAL HIGH (ref 0.0–4.4)
Cholesterol, Total: 280 mg/dL — ABNORMAL HIGH (ref 100–199)
HDL: 54 mg/dL (ref >39)
LDL Calculated: 195 mg/dL — ABNORMAL HIGH (ref 0–99)
Triglycerides: 153 mg/dL — ABNORMAL HIGH (ref 0–149)
VLDL Cholesterol Cal: 31 mg/dL (ref 5–40)

## 2012-10-23 LAB — HEPATIC FUNCTION PANEL
ALT: 18 IU/L (ref 0–32)
AST: 20 IU/L (ref 0–40)
Albumin: 4.1 g/dL (ref 3.6–4.8)
Alkaline Phosphatase: 76 IU/L (ref 39–117)
Bilirubin, Direct: 0.09 mg/dL (ref 0.00–0.40)
Total Bilirubin: 0.3 mg/dL (ref 0.0–1.2)
Total Protein: 6.4 g/dL (ref 6.0–8.5)

## 2012-10-23 LAB — ARTHRITIS PANEL
Anti Nuclear Antibody(ANA): NEGATIVE
Rhuematoid fact SerPl-aCnc: 11.6 IU/mL (ref 0.0–13.9)
Sed Rate: 7 mm/hr (ref 0–40)
Uric Acid: 6.2 mg/dL (ref 2.5–7.1)

## 2012-10-23 LAB — VITAMIN D 25 HYDROXY (VIT D DEFICIENCY, FRACTURES): Vit D, 25-Hydroxy: 32.5 ng/mL (ref 30.0–100.0)

## 2012-10-23 NOTE — Addendum Note (Signed)
Addended by: Magdalene River on: 10/23/2012 12:41 PM   Modules accepted: Orders

## 2012-10-24 ENCOUNTER — Encounter: Payer: Self-pay | Admitting: Internal Medicine

## 2012-10-30 ENCOUNTER — Encounter: Payer: Self-pay | Admitting: Pharmacist

## 2012-10-30 ENCOUNTER — Ambulatory Visit (INDEPENDENT_AMBULATORY_CARE_PROVIDER_SITE_OTHER): Payer: BC Managed Care – PPO | Admitting: Pharmacist

## 2012-10-30 VITALS — BP 124/70 | HR 75 | Ht 65.0 in | Wt 197.0 lb

## 2012-10-30 DIAGNOSIS — R5381 Other malaise: Secondary | ICD-10-CM

## 2012-10-30 DIAGNOSIS — E785 Hyperlipidemia, unspecified: Secondary | ICD-10-CM

## 2012-10-30 DIAGNOSIS — I10 Essential (primary) hypertension: Secondary | ICD-10-CM

## 2012-10-30 LAB — POCT CBC
Granulocyte percent: 49.3 %G (ref 37–80)
HCT, POC: 37.6 % — AB (ref 37.7–47.9)
Hemoglobin: 12.6 g/dL (ref 12.2–16.2)
Lymph, poc: 1.4 (ref 0.6–3.4)
MCH, POC: 27.5 pg (ref 27–31.2)
MCHC: 33.6 g/dL (ref 31.8–35.4)
MCV: 82.1 fL (ref 80–97)
MPV: 5.9 fL (ref 0–99.8)
POC Granulocyte: 1.6 — AB (ref 2–6.9)
POC LYMPH PERCENT: 42.7 %L (ref 10–50)
Platelet Count, POC: 294 10*3/uL (ref 142–424)
RBC: 4.6 M/uL (ref 4.04–5.48)
RDW, POC: 14 %
WBC: 3.3 10*3/uL — AB (ref 4.6–10.2)

## 2012-10-30 MED ORDER — PITAVASTATIN CALCIUM 2 MG PO TABS: 2.0000 mg | ORAL_TABLET | Freq: Every day | ORAL | Status: DC

## 2012-10-30 NOTE — Progress Notes (Signed)
Lipid Clinic Consultation  Chief Complaint:   Chief Complaint  Patient presents with  . Hyperlipidemia    HPI:  First lipid clinic visit. Patient has tried atorvastatin in past - unsure why stopped.  Earlier this year was taking crestor 40mg  but this was stopped in Feb 2014 due to aching all over.  Since stopped aching has improved but continues.    General Appearance:  alert, oriented, no acute distress and well nourished Mood/Affect:  normal       Component Value Date/Time   CHOL  Value: 132        ATP III CLASSIFICATION:  <200     mg/dL   Desirable  161-096  mg/dL   Borderline High  >=045    mg/dL   High 04/24/8117 1478   TRIG 153* 10/22/2012 1654   HDL 54 10/22/2012 1654   HDL 38* 06/11/2007 0450   VLDL 20 06/11/2007 0450   CHOLHDL 5.2* 10/22/2012 1654   CHOLHDL 3.5 06/11/2007 0450     Current NCEP Goals: LDL Goal < 100 HDL Goal >/= 45 Tg Goal < 295 Non-HDL Goal < 130  Secondary cause of hyperlipidemia present:  none Low fat diet followed?  No  Low carb diet followed?  No Exercise?  No  Assessment: Hyperlipidemia  CHF Risk Equivalents:  none  10 year AHA ASCVD risk = 10%  Primary Problem(s):  LDL or LDL-P elevated and TG  Elevated Anemia - last HBG was low in Feb 2014 - need to recheck today as patient has stopped iron supplement for last 2 months due to constirpation.   Recommendations: Changes in lipid medication(s):  Start livalo 2mg  daily - gave rx and coupon Discussed mediterranean diet - limit red meat, lean protein, mono and poly unsaturated fats, increase fresh fruits and vegetables Start exercise - 30 minutes daily CBC was normal - ok to remain off iron supplement but recheck CBC in 3 months to make sure remaining stable.  Recheck Lipid Panel:  3 months Other labs needed:  lft's  Time spent counseling patient:  40 minutes  Referring Provider:  Christell Constant PharmD:  Henrene Pastor, PHARMD

## 2012-11-06 ENCOUNTER — Telehealth: Payer: Self-pay | Admitting: Pharmacist

## 2012-11-06 DIAGNOSIS — E785 Hyperlipidemia, unspecified: Secondary | ICD-10-CM

## 2012-11-12 ENCOUNTER — Encounter (INDEPENDENT_AMBULATORY_CARE_PROVIDER_SITE_OTHER): Payer: Self-pay

## 2012-11-12 ENCOUNTER — Other Ambulatory Visit: Payer: Self-pay | Admitting: Internal Medicine

## 2012-11-12 ENCOUNTER — Ambulatory Visit (INDEPENDENT_AMBULATORY_CARE_PROVIDER_SITE_OTHER): Payer: BC Managed Care – PPO | Admitting: Gastroenterology

## 2012-11-12 ENCOUNTER — Encounter: Payer: Self-pay | Admitting: Gastroenterology

## 2012-11-12 VITALS — BP 134/83 | HR 75 | Temp 97.6°F | Wt 192.8 lb

## 2012-11-12 DIAGNOSIS — R1013 Epigastric pain: Secondary | ICD-10-CM

## 2012-11-12 DIAGNOSIS — G8929 Other chronic pain: Secondary | ICD-10-CM

## 2012-11-12 DIAGNOSIS — R110 Nausea: Secondary | ICD-10-CM

## 2012-11-12 DIAGNOSIS — Z860100 Personal history of colon polyps, unspecified: Secondary | ICD-10-CM

## 2012-11-12 DIAGNOSIS — Z85038 Personal history of other malignant neoplasm of large intestine: Secondary | ICD-10-CM

## 2012-11-12 MED ORDER — PEG-KCL-NACL-NASULF-NA ASC-C 100 G PO SOLR: 1.0000 | ORAL | Status: DC

## 2012-11-12 MED ORDER — PEG 3350-KCL-NA BICARB-NACL 420 G PO SOLR: 4000.0000 mL | ORAL | Status: DC

## 2012-11-12 NOTE — Patient Instructions (Signed)
1. Colonoscopy and upper endoscopy with Dr. Jena Gauss in the near future. Please see separate instructions.

## 2012-11-12 NOTE — Assessment & Plan Note (Addendum)
62 year old lady with history of gastroesophageal reflux disease status post remote Nissen fundoplication who presents with chronic postprandial epigastric pain and nausea. Status post cholecystectomy remotely. She does not recall her last upper endoscopy was. Pain generally lasts for several days at a time despite multiple over-the-counter antacids and Prilosec. Denies dysphagia, heartburn. CT scan earlier this year for abdominal pain showed no acute findings but she did have some small bowel adhesions to the anterior abdominal wall. Recommend upper endoscopy for further evaluation.  I have discussed the risks, alternatives, benefits with regards to but not limited to the risk of reaction to medication, bleeding, infection, perforation and the patient is agreeable to proceed. Written consent to be obtained.  Currently taking prilosec prn only.

## 2012-11-12 NOTE — Progress Notes (Signed)
Primary Care Physician:  Rudi Heap, MD  Primary Gastroenterologist:  Roetta Sessions, MD   Chief Complaint  Patient presents with  . Abdominal Pain    HPI:  Leah [name] is a 62 y.o. female here for further evaluation of chronic epigastric pain, nausea, history of colon polyps. We saw her back in 2007 for colonoscopy. See below for details. She's had a previous Nissan fundoplication in the early 1990s. No heartburn. Lot of nausea, epigastric pain especially with meals. Pain may last for days. Diaphoresis with the pain. Tries Mylanta, Prilosec, baking soda without relief. No dysphagia. BM every other day since last ventral hernia surgery. No brbpr, melena. No NSAIDS. ASA 81mg  daily. Low back pain especially at night, more right sided/hip. Sleeps on a pillow. Says she has had prior colonoscopies and had polyps.   Current Outpatient Prescriptions  Medication Sig Dispense Refill  . aspirin 81 MG tablet Take 81 mg by mouth daily.      . cholecalciferol (VITAMIN D) 1000 UNITS tablet Take 5,000 Units by mouth daily.       Marland Kitchen escitalopram (LEXAPRO) 10 MG tablet Take 10 mg by mouth daily.      . fish oil-omega-3 fatty acids 1000 MG capsule Take 2 g by mouth daily.      Marland Kitchen lisinopril-hydrochlorothiazide (PRINZIDE,ZESTORETIC) 20-12.5 MG per tablet Take 1 tablet by mouth daily.      . Pitavastatin Calcium (LIVALO PO) Take by mouth.       No current facility-administered medications for this visit.    Allergies as of 11/12/2012 - Review Complete 11/12/2012  Allergen Reaction Noted  . Codeine Nausea And Vomiting 04/18/2012  . Penicillins Other (See Comments) 10/22/2012    Past Medical History  Diagnosis Date  . Hyperlipidemia   . Hypertension   . GERD (gastroesophageal reflux disease)   . Depression   . Asthma   . Colon polyp     Past Surgical History  Procedure Laterality Date  . Abdominal hysterectomy    . Cholecystectomy    . Hernia repair      ventral X 2 with mesh, last one  2011 ([place]). complicated with 2 week hospital stay  . Carpel tunnel    . Tubal ligation    . Colonoscopy  02/08/2005    WUJ:[id number] hemorrhoids otherwise normal rectum/ A few scattered, shallow, left-sided sigmoid diverticula/The remainder of the colonic mucosa appeared normal  . Nissen fundoplication  1992    Family History  Problem Relation Age of Onset  . Heart disease Mother   . Alzheimer's disease Mother   . Colon cancer Neg Hx   . Other Father     deceased age 38 from some sort of GI problems but no malignancy    History   Social History  . Marital Status: Divorced    Spouse Name: N/A    Number of Children: 3  . Years of Education: N/A   Occupational History  . retired    Social History Main Topics  . Smoking status: Former Smoker    Quit date: 01/17/1996  . Smokeless tobacco: Not on file  . Alcohol Use: No  . Drug Use: No  . Sexual Activity: Not on file   Other Topics Concern  . Not on file   Social History Narrative  . No narrative on file      ROS:  General: Negative for anorexia, weight loss, fever, chills, fatigue, weakness. Eyes: Negative for vision changes.  ENT: Negative for hoarseness, difficulty  swallowing , nasal congestion. CV: Negative for chest pain, angina, palpitations, dyspnea on exertion, peripheral edema.  Respiratory: Negative for dyspnea at rest, dyspnea on exertion, cough, sputum, wheezing.  GI: See history of present illness. GU:  Negative for dysuria, hematuria, urinary incontinence, urinary frequency, nocturnal urination.  MS: Negative for joint pain, low back pain.  Derm: Negative for rash or itching.  Neuro: Negative for weakness, abnormal sensation, seizure, frequent headaches, memory loss, confusion.  Psych: Negative for anxiety, depression, suicidal ideation, hallucinations.  Endo: Negative for unusual weight change.  Heme: Negative for bruising or bleeding. Allergy: Negative for rash or hives.    Physical  Examination:  BP 134/83  Pulse 75  Temp(Src) 97.6 F (36.4 C) (Oral)  Wt 192 lb 12.8 oz (87.454 kg)  BMI 32.08 kg/m2   General: Well-nourished, well-developed in no acute distress.  Head: Normocephalic, atraumatic.   Eyes: Conjunctiva pink, no icterus. Mouth: Oropharyngeal mucosa moist and pink , no lesions erythema or exudate. Neck: Supple without thyromegaly, masses, or lymphadenopathy.  Lungs: Clear to auscultation bilaterally.  Heart: Regular rate and rhythm, no murmurs rubs or gallops.  Abdomen: Bowel sounds are normal, moderate epigastric tenderness. Nondistended, no hepatosplenomegaly or masses, no abdominal bruits or hernia , no rebound or guarding. Well-healed midline incision.   Rectal: Deferred Extremities: No lower extremity edema. No clubbing or deformities.  Neuro: Alert and oriented x 4 , grossly normal neurologically.  Skin: Warm and dry, no rash or jaundice.   Psych: Alert and cooperative, normal mood and affect.  Labs: Lab Results  Component Value Date   ALT 18 10/22/2012   AST 20 10/22/2012   ALKPHOS 76 10/22/2012   BILITOT 0.3 10/22/2012   Lab Results  Component Value Date   CREATININE 0.95 10/22/2012   BUN 14 10/22/2012   NA 143 10/22/2012   K 4.2 10/22/2012   CL 102 10/22/2012   CO2 31* 10/22/2012   Lab Results  Component Value Date   HGB 12.6 10/30/2012     Imaging Studies: Dg Lumbar Spine 2-3 Views  10/23/2012   CLINICAL DATA:  Low back pain and aches.  EXAM: LUMBAR SPINE - 2-3 VIEW  COMPARISON:  None.  FINDINGS: There is no evidence of lumbar spine fracture. No subluxation. Mild levocurvature of the lower thoracic and lumbar spine. Mild degenerative changes at L4-L5 and L5-S1. Metallic artifact from hernia mesh is noted.  IMPRESSION: No acute findings.   Electronically Signed   By: Jerene Dilling M.D.   On: 10/23/2012 10:54

## 2012-11-12 NOTE — Progress Notes (Signed)
cc'd to pcp 

## 2012-11-12 NOTE — Assessment & Plan Note (Signed)
Reports history of colon polyps on prior colonoscopies before we met her back in 2007. Last colonoscopy 7 years ago. Complains of bowel change since her last ventral hernia repair 3 years ago, bowel movements less frequent. Recommend update colonoscopy given history of prior colon polyps.  I have discussed the risks, alternatives, benefits with regards to but not limited to the risk of reaction to medication, bleeding, infection, perforation and the patient is agreeable to proceed. Written consent to be obtained.

## 2012-11-19 ENCOUNTER — Other Ambulatory Visit: Payer: Self-pay

## 2012-11-19 MED ORDER — PITAVASTATIN CALCIUM 2 MG PO TABS: 1.0000 | ORAL_TABLET | Freq: Every day | ORAL | Status: DC

## 2012-11-21 ENCOUNTER — Encounter (HOSPITAL_COMMUNITY): Payer: Self-pay | Admitting: Pharmacy Technician

## 2012-11-26 ENCOUNTER — Encounter: Payer: Self-pay | Admitting: Nurse Practitioner

## 2012-11-26 ENCOUNTER — Ambulatory Visit (INDEPENDENT_AMBULATORY_CARE_PROVIDER_SITE_OTHER): Payer: BC Managed Care – PPO | Admitting: Nurse Practitioner

## 2012-11-26 VITALS — BP 128/77 | HR 82 | Temp 97.9°F | Ht 65.0 in | Wt 193.0 lb

## 2012-11-26 DIAGNOSIS — Z124 Encounter for screening for malignant neoplasm of cervix: Secondary | ICD-10-CM

## 2012-11-26 DIAGNOSIS — K219 Gastro-esophageal reflux disease without esophagitis: Secondary | ICD-10-CM

## 2012-11-26 DIAGNOSIS — I10 Essential (primary) hypertension: Secondary | ICD-10-CM

## 2012-11-26 DIAGNOSIS — Z0000 Encounter for general adult medical examination without abnormal findings: Secondary | ICD-10-CM

## 2012-11-26 DIAGNOSIS — E785 Hyperlipidemia, unspecified: Secondary | ICD-10-CM

## 2012-11-26 DIAGNOSIS — Z01419 Encounter for gynecological examination (general) (routine) without abnormal findings: Secondary | ICD-10-CM

## 2012-11-26 LAB — POCT URINALYSIS DIPSTICK
Bilirubin, UA: NEGATIVE
Glucose, UA: NEGATIVE
Ketones, UA: NEGATIVE
Leukocytes, UA: NEGATIVE
Nitrite, UA: NEGATIVE
Protein, UA: NEGATIVE
Spec Grav, UA: 1.015
Urobilinogen, UA: NEGATIVE
pH, UA: 5

## 2012-11-26 LAB — POCT UA - MICROSCOPIC ONLY
Casts, Ur, LPF, POC: NEGATIVE
Crystals, Ur, HPF, POC: NEGATIVE
Mucus, UA: NEGATIVE
WBC, Ur, HPF, POC: NEGATIVE

## 2012-11-26 MED ORDER — LISINOPRIL-HYDROCHLOROTHIAZIDE 20-12.5 MG PO TABS: 1.0000 | ORAL_TABLET | Freq: Every day | ORAL | Status: DC

## 2012-11-26 NOTE — Progress Notes (Signed)
Subjective:    Patient ID: Leah [name], female    DOB: Jul 08, 1950, 62 y.o.   MRN: [id number]  HPI Patient in today for CPE and PAP- She is doing quite well- has no compliants today- has colonoscopy scheduled for this Friday. Patient Active Problem List   Diagnosis Date Noted  . Abdominal pain, chronic, epigastric 11/12/2012  . Nausea alone 11/12/2012  . Personal history of colonic polyps 11/12/2012  . Hypertension, benign essential, goal below 140/90 10/30/2012  . Hyperlipidemia 10/22/2012  . GERD (gastroesophageal reflux disease) 10/22/2012  . Unspecified asthma(493.90) 10/22/2012   Outpatient Encounter Prescriptions as of 11/26/2012  Medication Sig  . aspirin 81 MG tablet Take 81 mg by mouth daily.  . Cholecalciferol (VITAMIN D3) 5000 UNITS TABS Take 1 tablet by mouth daily.  Marland Kitchen escitalopram (LEXAPRO) 10 MG tablet Take 10 mg by mouth daily.  . fish oil-omega-3 fatty acids 1000 MG capsule Take 1 g by mouth daily.   Marland Kitchen lisinopril-hydrochlorothiazide (PRINZIDE,ZESTORETIC) 20-12.5 MG per tablet Take 1 tablet by mouth daily.  Marland Kitchen omeprazole (PRILOSEC) 20 MG capsule Take 20 mg by mouth daily as needed.  . Pitavastatin Calcium 2 MG TABS Take 1 tablet (2 mg total) by mouth daily.  . Prenatal Vit-Fe Fumarate-FA (PRENATAL MULTIVITAMIN) TABS tablet Take 1 tablet by mouth daily at 12 noon.  . [DISCONTINUED] peg 3350 powder (MOVIPREP) 100 G SOLR Take 1 kit (200 g total) by mouth as directed.  . [DISCONTINUED] polyethylene glycol-electrolytes (TRILYTE) 420 G solution Take 4,000 mLs by mouth as directed.       Review of Systems  Constitutional: Negative.   HENT: Negative.   Respiratory: Negative.   Cardiovascular: Negative.   Gastrointestinal: Negative.   Genitourinary: Negative.   Musculoskeletal: Negative.   Neurological: Negative.   Hematological: Negative.        Objective:   Physical Exam  Constitutional: She is oriented to person, place, and time. She appears well-developed  and well-nourished.  HENT:  Head: Normocephalic.  Right Ear: Hearing, tympanic membrane, external ear and ear canal normal.  Left Ear: Hearing, tympanic membrane, external ear and ear canal normal.  Nose: Nose normal.  Mouth/Throat: Uvula is midline and oropharynx is clear and moist.  Eyes: Conjunctivae and EOM are normal. Pupils are equal, round, and reactive to light.  Neck: Normal range of motion and full passive range of motion without pain. Neck supple. No JVD present. Carotid bruit is not present. No mass and no thyromegaly present.  Cardiovascular: Normal rate, normal heart sounds and intact distal pulses.   No murmur heard. Pulmonary/Chest: Effort normal and breath sounds normal. Right breast exhibits no inverted nipple, no mass, no nipple discharge, no skin change and no tenderness. Left breast exhibits no inverted nipple, no mass, no nipple discharge, no skin change and no tenderness.  Abdominal: Soft. Bowel sounds are normal. She exhibits no mass. There is no tenderness.  Genitourinary: Vagina normal and uterus normal. No breast swelling, tenderness, discharge or bleeding.  bimanual exam-No adnexal masses or tenderness. Vaginal cuff intact  Musculoskeletal: Normal range of motion.  Lymphadenopathy:    She has no cervical adenopathy.  Neurological: She is alert and oriented to person, place, and time.  Skin: Skin is warm and dry.  Psychiatric: She has a normal mood and affect. Her behavior is normal. Judgment and thought content normal.    BP 128/77  Pulse 82  Temp(Src) 97.9 F (36.6 C) (Oral)  Ht 5\' 5"  (1.651 m)  Wt 193 lb (87.544  kg)  BMI 32.12 kg/m2       Assessment & Plan:   1. Encounter for routine gynecological examination   2. Annual physical exam   3. Hyperlipidemia   4. Hypertension, benign essential, goal below 140/90   5. GERD (gastroesophageal reflux disease)    Orders Placed This Encounter  Procedures  . CMP14+EGFR  . NMR, lipoprofile  . Thyroid  Panel With TSH  . POCT UA - Microscopic Only  . POCT urinalysis dipstick  . POCT CBC   Meds ordered this encounter  Medications  . lisinopril-hydrochlorothiazide (PRINZIDE,ZESTORETIC) 20-12.5 MG per tablet    Sig: Take 1 tablet by mouth daily.    Dispense:  90 tablet    Refill:  1    Order Specific Question:  Supervising Provider    Answer:  Deborra Medina    Continue all meds Labs pending Diet and exercise encouraged Health maintenance reviewed Follow up in 6 months  Leah Daphine Deutscher, FNP

## 2012-11-26 NOTE — Patient Instructions (Signed)

## 2012-11-27 NOTE — Telephone Encounter (Signed)
Per Carren Rang in prior [employer].  Livalo was approved for 999 fills on 11/25/12.  Called [facility] to verify that they had filled and run through insurance.  Samantha at [facility] said they had but even with insurance and drug company discount card patient's portion of cost would be about $50 per month.

## 2012-11-27 NOTE — Telephone Encounter (Signed)
Patient received 8 week of samples yesterday.  Scheduled appt to have lipids recheck in 8 weeks. Will see how effective Livalo is and if patient can tolerate then will decide is she will continue. Patient aware of appt for labs and plan.

## 2012-11-29 ENCOUNTER — Ambulatory Visit (HOSPITAL_COMMUNITY)
Admission: RE | Admit: 2012-11-29 | Discharge: 2012-11-29 | Disposition: A | Discharge status: Home / Self Care | Payer: BC Managed Care – PPO | Source: Ambulatory Visit | Attending: Internal Medicine | Admitting: Internal Medicine

## 2012-11-29 ENCOUNTER — Encounter (HOSPITAL_COMMUNITY)
Admission: RE | Disposition: A | Discharge status: Home / Self Care | Payer: Self-pay | Source: Ambulatory Visit | Attending: Internal Medicine

## 2012-11-29 ENCOUNTER — Encounter (HOSPITAL_COMMUNITY): Payer: Self-pay | Admitting: *Deleted

## 2012-11-29 DIAGNOSIS — G8929 Other chronic pain: Secondary | ICD-10-CM

## 2012-11-29 DIAGNOSIS — R1013 Epigastric pain: Secondary | ICD-10-CM | POA: Insufficient documentation

## 2012-11-29 DIAGNOSIS — D126 Benign neoplasm of colon, unspecified: Secondary | ICD-10-CM

## 2012-11-29 DIAGNOSIS — Z860100 Personal history of colon polyps, unspecified: Secondary | ICD-10-CM | POA: Insufficient documentation

## 2012-11-29 DIAGNOSIS — Z1211 Encounter for screening for malignant neoplasm of colon: Secondary | ICD-10-CM

## 2012-11-29 DIAGNOSIS — Z85038 Personal history of other malignant neoplasm of large intestine: Secondary | ICD-10-CM

## 2012-11-29 DIAGNOSIS — I10 Essential (primary) hypertension: Secondary | ICD-10-CM | POA: Insufficient documentation

## 2012-11-29 DIAGNOSIS — K5730 Diverticulosis of large intestine without perforation or abscess without bleeding: Secondary | ICD-10-CM | POA: Insufficient documentation

## 2012-11-29 DIAGNOSIS — R110 Nausea: Secondary | ICD-10-CM

## 2012-11-29 HISTORY — PX: COLONOSCOPY WITH ESOPHAGOGASTRODUODENOSCOPY (EGD): SHX5779

## 2012-11-29 LAB — PAP IG W/ RFLX HPV ASCU: PAP Smear Comment: 0

## 2012-11-29 SURGERY — COLONOSCOPY WITH ESOPHAGOGASTRODUODENOSCOPY (EGD)
Anesthesia: Moderate Sedation

## 2012-11-29 MED ORDER — MIDAZOLAM HCL 5 MG/5ML IJ SOLN
INTRAMUSCULAR | Status: DC | PRN
  Administered 2012-11-29: 1 mg via INTRAVENOUS
  Administered 2012-11-29 (×2): 2 mg via INTRAVENOUS

## 2012-11-29 MED ORDER — MEPERIDINE HCL 100 MG/ML IJ SOLN
INTRAMUSCULAR | Status: AC
  Filled 2012-11-29: qty 2

## 2012-11-29 MED ORDER — ONDANSETRON HCL 4 MG/2ML IJ SOLN
INTRAMUSCULAR | Status: AC
  Filled 2012-11-29: qty 2

## 2012-11-29 MED ORDER — MIDAZOLAM HCL 5 MG/5ML IJ SOLN
INTRAMUSCULAR | Status: AC
  Filled 2012-11-29: qty 10

## 2012-11-29 MED ORDER — BUTAMBEN-TETRACAINE-BENZOCAINE 2-2-14 % EX AERO
INHALATION_SPRAY | CUTANEOUS | Status: DC | PRN
  Administered 2012-11-29: 2 via TOPICAL

## 2012-11-29 MED ORDER — MEPERIDINE HCL 100 MG/ML IJ SOLN
INTRAMUSCULAR | Status: DC | PRN
  Administered 2012-11-29: 50 mg via INTRAVENOUS
  Administered 2012-11-29: 25 mg via INTRAVENOUS

## 2012-11-29 MED ORDER — SODIUM CHLORIDE 0.9 % IV SOLN
INTRAVENOUS | Status: DC
  Administered 2012-11-29: 10:00:00 via INTRAVENOUS

## 2012-11-29 MED ORDER — ONDANSETRON HCL 4 MG/2ML IJ SOLN
INTRAMUSCULAR | Status: DC | PRN
  Administered 2012-11-29: 4 mg via INTRAVENOUS

## 2012-11-29 MED ORDER — STERILE WATER FOR IRRIGATION IR SOLN
Status: DC | PRN
  Administered 2012-11-29: 10:00:00

## 2012-11-29 NOTE — Op Note (Signed)
[facility] [address] [place], [address]   COLONOSCOPY PROCEDURE REPORT  PATIENT: Leah [name], Leah [name].  MR#:         [id number] BIRTHDATE: April 09, 1950 , 62  yrs. old GENDER: Female ENDOSCOPIST: R.  Roetta Sessions, MD FACP [facility] REFERRED BY:  Rudi Heap, M.D. PROCEDURE DATE:  11/29/2012 PROCEDURE:     Ileocolonoscopy with biopsy  INDICATIONS: history of colonic adenoma  INFORMED CONSENT:  The risks, benefits, alternatives and imponderables including but not limited to bleeding, perforation as well as the possibility of a missed lesion have been reviewed.  The potential for biopsy, lesion removal, etc. have also been discussed.  Questions have been answered.  All parties agreeable. Please see the history and physical in the medical record for more information.  MEDICATIONS: Versed 5 mg IV and Demerol 75 mg IV in divided doses. Zofran 4 mg IV  DESCRIPTION OF PROCEDURE:  After a digital rectal exam was performed, the EG-[id number] ([id number]) and [id number] ([id number]) colonoscope was advanced from the anus through the rectum and colon to the area of the cecum, ileocecal valve and appendiceal orifice. The cecum was deeply intubated.  These structures were well-seen and photographed for the record.  From the level of the cecum and ileocecal valve, the scope was slowly and cautiously withdrawn. The mucosal surfaces were carefully surveyed utilizing scope tip deflection to facilitate fold flattening as needed.  The scope was pulled down into the rectum where a thorough examination including retroflexion was performed.    FINDINGS:  Adequate preparation. Normal rectum. Few scattered left-sided diverticula; (2) diminutive polyps in the ascending segment; otherwise, the remainder of the colonic mucosa appeared normal. The distal 5 cm of terminal ileal mucosa also appeared normal.  THERAPEUTIC / DIAGNOSTIC MANEUVERS PERFORMED:  The above-mentioned polyps were cold  biopsied/removed  COMPLICATIONS: none  CECAL WITHDRAWAL TIME:  9 minutes  IMPRESSION:  Colonic diverticulosis. Colonic polyps-removed as described above  RECOMMENDATIONS: Followup on pathology.  See EGD report.   _______________________________ eSigned:  R. Roetta Sessions, MD FACP [facility] 11/29/2012 10:54 AM   CC:

## 2012-11-29 NOTE — H&P (View-Only) (Signed)
Primary Care Physician:  MOORE, DONALD, MD  Primary Gastroenterologist:  Michael Rourk, MD   Chief Complaint  Patient presents with  . Abdominal Pain    HPI:  Leah [name] is a 62 y.o. female here for further evaluation of chronic epigastric pain, nausea, history of colon polyps. We saw her back in 2007 for colonoscopy. See below for details. She's had a previous [employer] fundoplication in the early 1990s. No heartburn. Lot of nausea, epigastric pain especially with meals. Pain may last for days. Diaphoresis with the pain. Tries Mylanta, Prilosec, baking soda without relief. No dysphagia. BM every other day since last ventral hernia surgery. No brbpr, melena. No NSAIDS. ASA 81mg daily. Low back pain especially at night, more right sided/hip. Sleeps on a pillow. Says she has had prior colonoscopies and had polyps.   Current Outpatient Prescriptions  Medication Sig Dispense Refill  . aspirin 81 MG tablet Take 81 mg by mouth daily.      . cholecalciferol (VITAMIN D) 1000 UNITS tablet Take 5,000 Units by mouth daily.       . escitalopram (LEXAPRO) 10 MG tablet Take 10 mg by mouth daily.      . fish oil-omega-3 fatty acids 1000 MG capsule Take 2 g by mouth daily.      . lisinopril-hydrochlorothiazide (PRINZIDE,ZESTORETIC) 20-12.5 MG per tablet Take 1 tablet by mouth daily.      . Pitavastatin Calcium (LIVALO PO) Take by mouth.       No current facility-administered medications for this visit.    Allergies as of 11/12/2012 - Review Complete 11/12/2012  Allergen Reaction Noted  . Codeine Nausea And Vomiting 04/18/2012  . Penicillins Other (See Comments) 10/22/2012    Past Medical History  Diagnosis Date  . Hyperlipidemia   . Hypertension   . GERD (gastroesophageal reflux disease)   . Depression   . Asthma   . Colon polyp     Past Surgical History  Procedure Laterality Date  . Abdominal hysterectomy    . Cholecystectomy    . Hernia repair      ventral X 2 with mesh, last one  2011 ([place]). complicated with 2 week hospital stay  . Carpel tunnel    . Tubal ligation    . Colonoscopy  02/08/2005    RMR:Internal hemorrhoids otherwise normal rectum/ A few scattered, shallow, left-sided sigmoid diverticula/The remainder of the colonic mucosa appeared normal  . Nissen fundoplication  1992    Family History  Problem Relation Age of Onset  . Heart disease Mother   . Alzheimer's disease Mother   . Colon cancer Neg Hx   . Other Father     deceased age 49 from some sort of GI problems but no malignancy    History   Social History  . Marital Status: Divorced    Spouse Name: N/A    Number of Children: 3  . Years of Education: N/A   Occupational History  . retired    Social History Main Topics  . Smoking status: Former Smoker    Quit date: 01/17/1996  . Smokeless tobacco: Not on file  . Alcohol Use: No  . Drug Use: No  . Sexual Activity: Not on file   Other Topics Concern  . Not on file   Social History Narrative  . No narrative on file      ROS:  General: Negative for anorexia, weight loss, fever, chills, fatigue, weakness. Eyes: Negative for vision changes.  ENT: Negative for hoarseness, difficulty   swallowing , nasal congestion. CV: Negative for chest pain, angina, palpitations, dyspnea on exertion, peripheral edema.  Respiratory: Negative for dyspnea at rest, dyspnea on exertion, cough, sputum, wheezing.  GI: See history of present illness. GU:  Negative for dysuria, hematuria, urinary incontinence, urinary frequency, nocturnal urination.  MS: Negative for joint pain, low back pain.  Derm: Negative for rash or itching.  Neuro: Negative for weakness, abnormal sensation, seizure, frequent headaches, memory loss, confusion.  Psych: Negative for anxiety, depression, suicidal ideation, hallucinations.  Endo: Negative for unusual weight change.  Heme: Negative for bruising or bleeding. Allergy: Negative for rash or hives.    Physical  Examination:  BP 134/83  Pulse 75  Temp(Src) 97.6 F (36.4 C) (Oral)  Wt 192 lb 12.8 oz (87.454 kg)  BMI 32.08 kg/m2   General: Well-nourished, well-developed in no acute distress.  Head: Normocephalic, atraumatic.   Eyes: Conjunctiva pink, no icterus. Mouth: Oropharyngeal mucosa moist and pink , no lesions erythema or exudate. Neck: Supple without thyromegaly, masses, or lymphadenopathy.  Lungs: Clear to auscultation bilaterally.  Heart: Regular rate and rhythm, no murmurs rubs or gallops.  Abdomen: Bowel sounds are normal, moderate epigastric tenderness. Nondistended, no hepatosplenomegaly or masses, no abdominal bruits or hernia , no rebound or guarding. Well-healed midline incision.   Rectal: Deferred Extremities: No lower extremity edema. No clubbing or deformities.  Neuro: Alert and oriented x 4 , grossly normal neurologically.  Skin: Warm and dry, no rash or jaundice.   Psych: Alert and cooperative, normal mood and affect.  Labs: Lab Results  Component Value Date   ALT 18 10/22/2012   AST 20 10/22/2012   ALKPHOS 76 10/22/2012   BILITOT 0.3 10/22/2012   Lab Results  Component Value Date   CREATININE 0.95 10/22/2012   BUN 14 10/22/2012   NA 143 10/22/2012   K 4.2 10/22/2012   CL 102 10/22/2012   CO2 31* 10/22/2012   Lab Results  Component Value Date   HGB 12.6 10/30/2012     Imaging Studies: Dg Lumbar Spine 2-3 Views  10/23/2012   CLINICAL DATA:  Low back pain and aches.  EXAM: LUMBAR SPINE - 2-3 VIEW  COMPARISON:  None.  FINDINGS: There is no evidence of lumbar spine fracture. No subluxation. Mild levocurvature of the lower thoracic and lumbar spine. Mild degenerative changes at L4-L5 and L5-S1. Metallic artifact from hernia mesh is noted.  IMPRESSION: No acute findings.   Electronically Signed   By: Margaret  Yacobozzi M.D.   On: 10/23/2012 10:54      

## 2012-11-29 NOTE — Interval H&P Note (Signed)
History and Physical Interval Note:  11/29/2012 10:16 AM  Leah [name]  has presented today for surgery, with the diagnosis of HISTORY OF COLON POLYPS, EPIGASTRIC PAIN, NAUSEA  The various methods of treatment have been discussed with the patient and family. After consideration of risks, benefits and other options for treatment, the patient has consented to  Procedure(s) with comments: COLONOSCOPY WITH ESOPHAGOGASTRODUODENOSCOPY (EGD) (N/A) - 10:45 as a surgical intervention .  The patient's history has been reviewed, patient examined, no change in status, stable for surgery.  I have reviewed the patient's chart and labs.  Questions were answered to the patient's satisfaction.     Epigastric pain occurs almost exclusively after she overindulges and eats spicy foods. Epigastric pain reminds her of the reflux symptom she has had  along the way for years EGD and colonoscopy today per plan.  The risks, benefits, limitations, imponderables and alternatives regarding both EGD and colonoscopy have been reviewed with the patient. Questions have been answered. All parties agreeable.   Eula Listen

## 2012-11-29 NOTE — Op Note (Signed)
[facility] [address] [place], [address]   ENDOSCOPY PROCEDURE REPORT  PATIENT: Leah [name], Leah [name].  MR#: [id number] BIRTHDATE: 08-20-1950 , 62  yrs. old GENDER: Female ENDOSCOPIST: R.  Roetta Sessions, MD FACP [facility] REFERRED BY:  Rudi Heap, M.D. PROCEDURE DATE:  11/29/2012 PROCEDURE:    Diagnostic EGD INDICATIONS:     Intermittent epigastric pain - status post Nissen fundoplication  INFORMED CONSENT:   The risks, benefits, limitations, alternatives and imponderables have been discussed.  The potential for biopsy, esophogeal dilation, etc. have also been reviewed.  Questions have been answered.  All parties agreeable.  Please see the history and physical in the medical record for more information.  MEDICATIONS: Versed 5 mg IV and Demerol 75 mg IV in divided doses. Cetacaine Spray. Zofran 4 mg IV  DESCRIPTION OF PROCEDURE:   The EG-[id number] ([id number])  endoscope was introduced through the mouth and advanced to the second portion of the duodenum without difficulty or limitations.  The mucosal surfaces were surveyed very carefully during advancement of the scope and upon withdrawal.  Retroflexion view of the proximal stomach and esophagogastric junction was performed.      FINDINGS:   Normal esophagus. Stomach empty. Patient appears to have a loose, partial wrap (query toupe).  Normal gastric mucosa. Patent pylorus. Normal first and second portion of the duodenum.  THERAPEUTIC / DIAGNOSTIC MANEUVERS PERFORMED:  None   COMPLICATIONS:  None  IMPRESSION:   Status post prior fundoplication. Patient states she only gets epigastric pain which she over indulges in spicy, heavy meals. About once monthly. She notes if she does not overindulge and watches her diet she has no symptoms.  RECOMMENDATIONS:   GERD diet. Pepcid Complete  - 2 tablets orally when necessary for symptoms  See colonoscopy report.    _______________________________ R. Roetta Sessions, MD  FACP [facility] eSigned:  R. Roetta Sessions, MD FACP [facility] 11/29/2012 10:35 AM     CC:  PATIENT NAME:  Rei, Contee. MR#: [id number]

## 2012-12-02 ENCOUNTER — Encounter: Payer: Self-pay | Admitting: *Deleted

## 2012-12-02 ENCOUNTER — Encounter: Payer: Self-pay | Admitting: Internal Medicine

## 2012-12-03 ENCOUNTER — Encounter (HOSPITAL_COMMUNITY): Payer: Self-pay | Admitting: Internal Medicine

## 2012-12-09 ENCOUNTER — Encounter: Payer: Self-pay | Admitting: *Deleted

## 2012-12-25 ENCOUNTER — Other Ambulatory Visit: Payer: Self-pay

## 2012-12-25 DIAGNOSIS — Z1231 Encounter for screening mammogram for malignant neoplasm of breast: Secondary | ICD-10-CM

## 2013-01-22 ENCOUNTER — Other Ambulatory Visit (INDEPENDENT_AMBULATORY_CARE_PROVIDER_SITE_OTHER): Payer: 59

## 2013-01-22 DIAGNOSIS — E785 Hyperlipidemia, unspecified: Secondary | ICD-10-CM

## 2013-01-22 NOTE — Progress Notes (Signed)
Patient came in for labs for Leah [name]

## 2013-01-23 LAB — LIPID PANEL
Chol/HDL Ratio: 4 ratio units (ref 0.0–4.4)
Cholesterol, Total: 209 mg/dL — ABNORMAL HIGH (ref 100–199)
HDL: 52 mg/dL (ref >39)
LDL Calculated: 126 mg/dL — ABNORMAL HIGH (ref 0–99)
Triglycerides: 156 mg/dL — ABNORMAL HIGH (ref 0–149)
VLDL Cholesterol Cal: 31 mg/dL (ref 5–40)

## 2013-01-27 ENCOUNTER — Telehealth: Payer: Self-pay | Admitting: Pharmacist

## 2013-01-27 NOTE — Telephone Encounter (Signed)
LDL and total cholesterol much improved but Tg remained that same.  Continue Livalo 2mg  daily - discussed limiting CHO in diet - pasta, sweet and potatoes.

## 2013-01-28 ENCOUNTER — Ambulatory Visit
Admission: RE | Admit: 2013-01-28 | Discharge: 2013-01-28 | Disposition: A | Discharge status: Home / Self Care | Payer: 59 | Source: Ambulatory Visit

## 2013-01-28 ENCOUNTER — Other Ambulatory Visit: Payer: Self-pay | Admitting: Family Medicine

## 2013-01-28 DIAGNOSIS — N63 Unspecified lump in unspecified breast: Secondary | ICD-10-CM

## 2013-01-28 DIAGNOSIS — Z1231 Encounter for screening mammogram for malignant neoplasm of breast: Secondary | ICD-10-CM

## 2013-02-10 ENCOUNTER — Ambulatory Visit
Admission: RE | Admit: 2013-02-10 | Discharge: 2013-02-10 | Disposition: A | Discharge status: Home / Self Care | Payer: 59 | Source: Ambulatory Visit | Attending: Family Medicine | Admitting: Family Medicine

## 2013-02-10 ENCOUNTER — Other Ambulatory Visit: Payer: Self-pay | Admitting: Family Medicine

## 2013-02-10 DIAGNOSIS — N63 Unspecified lump in unspecified breast: Secondary | ICD-10-CM

## 2013-02-10 DIAGNOSIS — N644 Mastodynia: Secondary | ICD-10-CM

## 2013-02-24 ENCOUNTER — Other Ambulatory Visit: Payer: Self-pay

## 2013-02-24 MED ORDER — ESCITALOPRAM OXALATE 10 MG PO TABS: 10.0000 mg | ORAL_TABLET | Freq: Every day | ORAL | Status: DC

## 2013-03-24 ENCOUNTER — Telehealth: Payer: Self-pay | Admitting: Family Medicine

## 2013-03-24 NOTE — Telephone Encounter (Signed)
appt made

## 2013-03-25 ENCOUNTER — Ambulatory Visit (INDEPENDENT_AMBULATORY_CARE_PROVIDER_SITE_OTHER): Payer: 59

## 2013-03-25 ENCOUNTER — Encounter (INDEPENDENT_AMBULATORY_CARE_PROVIDER_SITE_OTHER): Payer: Self-pay

## 2013-03-25 ENCOUNTER — Encounter: Payer: Self-pay | Admitting: Family Medicine

## 2013-03-25 ENCOUNTER — Ambulatory Visit (INDEPENDENT_AMBULATORY_CARE_PROVIDER_SITE_OTHER): Payer: 59 | Admitting: Family Medicine

## 2013-03-25 VITALS — BP 142/90 | HR 70 | Temp 98.1°F | Ht 65.0 in | Wt 189.0 lb

## 2013-03-25 DIAGNOSIS — M79609 Pain in unspecified limb: Secondary | ICD-10-CM

## 2013-03-25 DIAGNOSIS — M79641 Pain in right hand: Secondary | ICD-10-CM

## 2013-03-25 DIAGNOSIS — F4323 Adjustment disorder with mixed anxiety and depressed mood: Secondary | ICD-10-CM

## 2013-03-25 DIAGNOSIS — R0789 Other chest pain: Secondary | ICD-10-CM

## 2013-03-25 MED ORDER — DICLOFENAC SODIUM 1 % TD GEL: 4.0000 g | Freq: Four times a day (QID) | TRANSDERMAL | Status: DC

## 2013-03-25 NOTE — Patient Instructions (Signed)
Use the wrist brace regularly Applied the prescribed ointment as directed Avoid any lifting pushing or pulling with the right hand

## 2013-03-25 NOTE — Progress Notes (Signed)
Subjective:    Patient ID: Leah [name], female    DOB: 03/31/1950, 63 y.o.   MRN: [id number]  HPI Patient here today for right hand pain and off/on chest pain/tightness. The pain in her hand is in the right thenar area. The chest tightness just comes and goes and is not there consistently.       Patient Active Problem List   Diagnosis Date Noted  . Abdominal pain, chronic, epigastric 11/12/2012  . Nausea alone 11/12/2012  . Personal history of colonic polyps 11/12/2012  . Hypertension, benign essential, goal below 140/90 10/30/2012  . Hyperlipidemia 10/22/2012  . GERD (gastroesophageal reflux disease) 10/22/2012  . Unspecified asthma(493.90) 10/22/2012   Outpatient Encounter Prescriptions as of 03/25/2013  Medication Sig  . aspirin 81 MG tablet Take 81 mg by mouth daily.  . Cholecalciferol (VITAMIN D3) 5000 UNITS TABS Take 1 tablet by mouth daily.  Marland Kitchen escitalopram (LEXAPRO) 10 MG tablet Take 1 tablet (10 mg total) by mouth daily.  . fish oil-omega-3 fatty acids 1000 MG capsule Take 1 g by mouth daily.   Marland Kitchen lisinopril-hydrochlorothiazide (PRINZIDE,ZESTORETIC) 20-12.5 MG per tablet Take 1 tablet by mouth daily.  Marland Kitchen omeprazole (PRILOSEC) 20 MG capsule Take 20 mg by mouth daily as needed.  . Pitavastatin Calcium 2 MG TABS Take 1 tablet (2 mg total) by mouth daily.  . Prenatal Vit-Fe Fumarate-FA (PRENATAL MULTIVITAMIN) TABS tablet Take 1 tablet by mouth daily at 12 noon.   \ Review of Systems  Constitutional: Negative.   HENT: Negative.   Eyes: Negative.   Respiratory: Positive for chest tightness (off/on).   Cardiovascular: Negative.   Gastrointestinal: Negative.   Endocrine: Negative.   Genitourinary: Negative.   Musculoskeletal: Positive for arthralgias (right hand pain).  Skin: Negative.   Allergic/Immunologic: Negative.   Neurological: Negative.   Hematological: Negative.   Psychiatric/Behavioral: Negative.        Objective:   Physical Exam  Nursing note and  vitals reviewed. Constitutional: She is oriented to person, place, and time. She appears well-developed and well-nourished. No distress.  HENT:  Head: Normocephalic and atraumatic.  Right Ear: External ear normal.  Left Ear: External ear normal.  Nose: Nose normal.  Mouth/Throat: Oropharynx is clear and moist.  Eyes: Conjunctivae and EOM are normal. Pupils are equal, round, and reactive to light. Right eye exhibits no discharge. Left eye exhibits no discharge. No scleral icterus.  Neck: Normal range of motion. Neck supple. No thyromegaly present.  Cardiovascular: Normal rate, regular rhythm, normal heart sounds and intact distal pulses.   No murmur heard. Pulmonary/Chest: Effort normal and breath sounds normal. No respiratory distress. She has no wheezes. She has no rales. She exhibits no tenderness.  Abdominal: Soft. Bowel sounds are normal. She exhibits no mass. There is no tenderness. There is no rebound and no guarding.  Musculoskeletal: Normal range of motion. She exhibits edema and tenderness.  There is tenderness and crepitus in the right thenar area.  Lymphadenopathy:    She has no cervical adenopathy.  Neurological: She is alert and oriented to person, place, and time.  Skin: Skin is warm and dry. No rash noted.  Psychiatric: She has a normal mood and affect. Her behavior is normal. Judgment and thought content normal.   BP 142/90  Pulse 70  Temp(Src) 98.1 F (36.7 C) (Oral)  Ht 5\' 5"  (1.651 m)  Wt 189 lb (85.73 kg)  BMI 31.45 kg/m2  [facility] reading (PRIMARY) by  Dr. Governor Rooks hand-no acute abnormality  EKG:NSR, no acute change  Repeat blood pressure 132/90 right arm      Assessment & Plan:  1. Right hand pain - DG Hand Complete Right; Future - diclofenac sodium (VOLTAREN) 1 % GEL; Apply 4 g topically 4 (four) times daily.  Dispense: 100 g; Refill: 0  2. Chest tightness - EKG 12-Lead  3. Adjustment disorder with mixed anxiety and  depressed mood  Patient Instructions  Use the wrist brace regularly Applied the prescribed ointment as directed Avoid any lifting pushing or pulling with the right hand   Arrie Senate MD

## 2013-05-02 NOTE — Telephone Encounter (Signed)
Resolved

## 2013-05-06 ENCOUNTER — Other Ambulatory Visit: Payer: Self-pay | Admitting: Nurse Practitioner

## 2013-05-09 ENCOUNTER — Telehealth: Payer: Self-pay | Admitting: Family Medicine

## 2013-05-09 NOTE — Telephone Encounter (Signed)
Blood pressure has been persistently elevated even though she is taking her medication and nothing has changed. She is avoiding salt and drinking plenty of water. Appt scheduled with Dr. Laurance Flatten. She is to continue to monitor her blood pressure and report any negative symptoms she develops.

## 2013-05-21 ENCOUNTER — Ambulatory Visit (INDEPENDENT_AMBULATORY_CARE_PROVIDER_SITE_OTHER): Payer: 59 | Admitting: Family Medicine

## 2013-05-21 ENCOUNTER — Encounter: Payer: Self-pay | Admitting: Family Medicine

## 2013-05-21 VITALS — BP 129/82 | HR 79 | Temp 97.0°F | Ht 65.0 in | Wt 190.0 lb

## 2013-05-21 DIAGNOSIS — S40012A Contusion of left shoulder, initial encounter: Secondary | ICD-10-CM

## 2013-05-21 DIAGNOSIS — M19041 Primary osteoarthritis, right hand: Secondary | ICD-10-CM

## 2013-05-21 DIAGNOSIS — I10 Essential (primary) hypertension: Secondary | ICD-10-CM

## 2013-05-21 DIAGNOSIS — M19049 Primary osteoarthritis, unspecified hand: Secondary | ICD-10-CM

## 2013-05-21 DIAGNOSIS — M79643 Pain in unspecified hand: Secondary | ICD-10-CM

## 2013-05-21 DIAGNOSIS — M79609 Pain in unspecified limb: Secondary | ICD-10-CM

## 2013-05-21 DIAGNOSIS — F4323 Adjustment disorder with mixed anxiety and depressed mood: Secondary | ICD-10-CM

## 2013-05-21 NOTE — Progress Notes (Signed)
Subjective:    Patient ID: Leah [name], female    DOB: [birth year], 63 y.o.   MRN: [id number]  HPI Patient here today for recent elevation in BP and continued right hand pain. The results of the previous X. ray were reviewed and indicated that she had osteoarthritic changes in the bones of the hand. Because of the continued pain she will most likely need a referral to the hand surgeon. Her blood pressures are doing better. She also has a history of hyperlipidemia. She will need lab work today including a uric acid        Patient Active Problem List   Diagnosis Date Noted  . Adjustment disorder with mixed anxiety and depressed mood 03/25/2013  . Abdominal pain, chronic, epigastric 11/12/2012  . Nausea alone 11/12/2012  . Personal history of colonic polyps 11/12/2012  . Hypertension, benign essential, goal below 140/90 10/30/2012  . Hyperlipidemia 10/22/2012  . GERD (gastroesophageal reflux disease) 10/22/2012  . Unspecified asthma(493.90) 10/22/2012   Outpatient Encounter Prescriptions as of 05/21/2013  Medication Sig  . aspirin 81 MG tablet Take 81 mg by mouth daily.  . Cholecalciferol (VITAMIN D3) 5000 UNITS TABS Take 1 tablet by mouth daily.  . diclofenac sodium (VOLTAREN) 1 % GEL Apply 4 g topically 4 (four) times daily.  Marland Kitchen escitalopram (LEXAPRO) 10 MG tablet TAKE ONE TABLET BY MOUTH ONCE DAILY  . fish oil-omega-3 fatty acids 1000 MG capsule Take 1 g by mouth daily.   Marland Kitchen lisinopril-hydrochlorothiazide (PRINZIDE,ZESTORETIC) 20-12.5 MG per tablet Take 1 tablet by mouth daily.  Marland Kitchen omeprazole (PRILOSEC) 20 MG capsule Take 20 mg by mouth daily as needed.  . Pitavastatin Calcium 2 MG TABS Take 1 tablet (2 mg total) by mouth daily.  . Prenatal Vit-Fe Fumarate-FA (PRENATAL MULTIVITAMIN) TABS tablet Take 1 tablet by mouth daily at 12 noon.    Review of Systems  Constitutional: Negative.   HENT: Negative.   Eyes: Negative.   Respiratory: Negative.   Cardiovascular: Negative.     Gastrointestinal: Negative.   Endocrine: Negative.   Genitourinary: Negative.   Musculoskeletal: Positive for arthralgias (right hand pain).  Skin: Negative.   Allergic/Immunologic: Negative.   Neurological: Negative.   Hematological: Negative.   Psychiatric/Behavioral: Negative.        Objective:   Physical Exam  Nursing note and vitals reviewed. Constitutional: She is oriented to person, place, and time. She appears well-developed and well-nourished. No distress.  HENT:  Head: Normocephalic and atraumatic.  Right Ear: External ear normal.  Left Ear: External ear normal.  Nose: Nose normal.  Mouth/Throat: Oropharynx is clear and moist.  Eyes: Conjunctivae and EOM are normal. Pupils are equal, round, and reactive to light. Right eye exhibits no discharge. Left eye exhibits no discharge. No scleral icterus.  Neck: Normal range of motion. Neck supple. No thyromegaly present.  No carotid bruits  Cardiovascular: Normal rate, regular rhythm, normal heart sounds and intact distal pulses.  Exam reveals no gallop and no friction rub.   No murmur heard. At 72 per minute  Pulmonary/Chest: Effort normal and breath sounds normal. No respiratory distress. She has no wheezes. She has no rales. She exhibits no tenderness.  Abdominal: Soft. Bowel sounds are normal. She exhibits no mass. There is no tenderness. There is no rebound and no guarding.  Musculoskeletal: Normal range of motion. She exhibits no edema and no tenderness.  Patient has general tenderness in the right hand. She has a contusion of the left shoulder from a recent injury.  Lymphadenopathy:    She has no cervical adenopathy.  Neurological: She is alert and oriented to person, place, and time. She has normal reflexes. No cranial nerve deficit.  Skin: Skin is warm and dry. No rash noted.  Psychiatric: She has a normal mood and affect. Her behavior is normal. Judgment and thought content normal.  She seems positive in her  demeanor and he is happy and that she has a new job that she is pleased with.   BP 129/82  Pulse 79  Temp(Src) 97 F (36.1 C) (Oral)  Ht $R'5\' 5"'ga$  (1.651 m)  Wt 190 lb (86.183 kg)  BMI 31.62 kg/m2        Assessment & Plan:  1. Osteoarthritis of hand, right - Ambulatory referral to Orthopedic Surgery - Uric acid  2. Hand pain - Uric acid  3. HTN (hypertension) - BMP8+EGFR - Hepatic function panel - Lipid panel  4. Adjustment disorder with mixed anxiety and depressed mood -This seems to be stable with a new job and resolving some personal issues  5. Contusion of left shoulder  Patient Instructions  Continue current medication We will arrange for you to have an appointment with the orthopedic hand surgeon, Dr. Daryll Brod Continue to walk and exercise regularly Current plenty of fluids Avoid sodium intake, continue to monitor blood pressure at home We will call you with the results of her lab work once those results are available Use warm wet compresses to left shoulder contusion 20 minutes 3 or 4 times daily   Arrie Senate MD

## 2013-05-21 NOTE — Patient Instructions (Addendum)
Continue current medication We will arrange for you to have an appointment with the orthopedic hand surgeon, Dr. Daryll Brod Continue to walk and exercise regularly Current plenty of fluids Avoid sodium intake, continue to monitor blood pressure at home We will call you with the results of her lab work once those results are available Use warm wet compresses to left shoulder contusion 20 minutes 3 or 4 times daily

## 2013-05-22 LAB — HEPATIC FUNCTION PANEL
ALT: 19 [IU]/L (ref 0–32)
AST: 16 [IU]/L (ref 0–40)
Albumin: 4.2 g/dL (ref 3.6–4.8)
Alkaline Phosphatase: 74 [IU]/L (ref 39–117)
Bilirubin, Direct: 0.07 mg/dL (ref 0.00–0.40)
Total Bilirubin: 0.3 mg/dL (ref 0.0–1.2)
Total Protein: 6.4 g/dL (ref 6.0–8.5)

## 2013-05-22 LAB — BMP8+EGFR
BUN/Creatinine Ratio: 19 (ref 11–26)
BUN: 19 mg/dL (ref 8–27)
CO2: 26 mmol/L (ref 18–29)
Calcium: 9.3 mg/dL (ref 8.7–10.3)
Chloride: 100 mmol/L (ref 97–108)
Creatinine, Ser: 1 mg/dL (ref 0.57–1.00)
GFR calc Af Amer: 70 mL/min/{1.73_m2} (ref >59)
GFR calc non Af Amer: 61 mL/min/{1.73_m2} (ref >59)
Glucose: 89 mg/dL (ref 65–99)
Potassium: 4 mmol/L (ref 3.5–5.2)
Sodium: 140 mmol/L (ref 134–144)

## 2013-05-22 LAB — LIPID PANEL
Chol/HDL Ratio: 5.8 ratio units — ABNORMAL HIGH (ref 0.0–4.4)
Cholesterol, Total: 276 mg/dL — ABNORMAL HIGH (ref 100–199)
HDL: 48 mg/dL (ref >39)
LDL Calculated: 205 mg/dL — ABNORMAL HIGH (ref 0–99)
Triglycerides: 113 mg/dL (ref 0–149)
VLDL Cholesterol Cal: 23 mg/dL (ref 5–40)

## 2013-05-22 LAB — URIC ACID: Uric Acid: 6.9 mg/dL (ref 2.5–7.1)

## 2013-05-28 ENCOUNTER — Telehealth: Payer: Self-pay | Admitting: Family Medicine

## 2013-05-28 NOTE — Telephone Encounter (Signed)
Message copied by Waverly Ferrari on Wed May 28, 2013  9:59 AM ------      Message from: Chipper Herb      Created: Thu May 22, 2013  7:14 AM       Blood sugar renal and electrolytes including potassium are within normal limit      Liver function tests are within normal limit      On a traditional lipid panel, the total cholesterol is elevated and the LDL C. cholesterol is very elevated.-------- please schedule patient for a visit with the clinical pharmacist to discuss treatment options and diet and exercise+++++++++Important      The uric acid is slightly elevated at 6.9, but this is not enough to warrant treatment ------

## 2013-05-29 NOTE — Telephone Encounter (Signed)
Patient aware.

## 2013-06-23 ENCOUNTER — Encounter: Payer: Self-pay | Admitting: Pharmacist

## 2013-06-23 ENCOUNTER — Ambulatory Visit (INDEPENDENT_AMBULATORY_CARE_PROVIDER_SITE_OTHER): Payer: 59 | Admitting: Pharmacist

## 2013-06-23 VITALS — BP 116/72 | HR 78 | Ht 65.0 in | Wt 190.0 lb

## 2013-06-23 DIAGNOSIS — E785 Hyperlipidemia, unspecified: Secondary | ICD-10-CM

## 2013-06-23 MED ORDER — ROSUVASTATIN CALCIUM 10 MG PO TABS: 10.0000 mg | ORAL_TABLET | Freq: Every day | ORAL | Status: DC

## 2013-06-23 MED ORDER — ROSUVASTATIN CALCIUM 20 MG PO TABS: 20.0000 mg | ORAL_TABLET | ORAL | Status: DC

## 2013-06-23 NOTE — Progress Notes (Signed)
Lipid Clinic Consultation  Chief Complaint:   Chief Complaint  Patient presents with  . Hyperlipidemia    HPI:  Patient was last seen about 8 months ago for hyperlipidemia.  At that time she was started on Livalo / pitavastatin 2mg  and had see good results.  However in March her BP was elevated and she was afraid it was related to Livalo so she discontinued.  He insurance also does not cover Livalo.   Patient has tried atorvastatin in past - unsure why stopped.  She has also taken crestor 40mg  but this was stopped in Feb 2014 due to aching all over.    General Appearance:  alert, oriented, no acute distress and well nourished Mood/Affect:  normal       Component Value Date/Time   Total CHOL  276 05/21/2013 0450   TRIG 113 05/21/2013 1039   HDL 48 05/21/2013 1039   LDLCALC 205* 05/21/2013 1039     Current NCEP Goals: LDL Goal < 100 HDL Goal >/= 45 Tg Goal < 150 Non-HDL Goal < 130  Secondary cause of hyperlipidemia present:  none Low fat diet followed?  Yes Low carb diet followed?  No Exercise?  No  Assessment: Hyperlipidemia  CHF Risk Equivalents:  none  10 year AHA ASCVD risk = 10%  Primary Problem(s):  LDL or LDL-P elevated and TG Elevated  Recommendations: Changes in lipid medication(s):  Start Crestor 10mg  1 tablet daily (only has 20mg  samples so she will take every other day until runs out and then will fill rx for 10mg ) Discussed mediterranean diet - limit red meat, lean protein, mono and poly unsaturated fats, increase fresh fruits and vegetables Start exercise - 30 minutes daily   Recheck Lipid Panel:  2 months Other labs needed:  lft's  Time spent counseling patient:  30 minutes  Referring Provider:  Laurance Flatten PharmD:  Cherre Robins, PHARMD

## 2013-08-04 ENCOUNTER — Other Ambulatory Visit: Payer: Self-pay | Admitting: Nurse Practitioner

## 2013-08-07 ENCOUNTER — Other Ambulatory Visit: Payer: Self-pay | Admitting: Family Medicine

## 2013-08-07 DIAGNOSIS — R921 Mammographic calcification found on diagnostic imaging of breast: Secondary | ICD-10-CM

## 2013-08-25 ENCOUNTER — Other Ambulatory Visit (INDEPENDENT_AMBULATORY_CARE_PROVIDER_SITE_OTHER): Payer: 59

## 2013-08-25 DIAGNOSIS — E785 Hyperlipidemia, unspecified: Secondary | ICD-10-CM

## 2013-08-25 NOTE — Progress Notes (Signed)
Pt came in for lab  only 

## 2013-08-26 LAB — CMP14+EGFR
ALT: 13 IU/L (ref 0–32)
AST: 13 IU/L (ref 0–40)
Albumin/Globulin Ratio: 1.9 (ref 1.1–2.5)
Albumin: 3.9 g/dL (ref 3.6–4.8)
Alkaline Phosphatase: 71 IU/L (ref 39–117)
BUN/Creatinine Ratio: 24 (ref 11–26)
BUN: 21 mg/dL (ref 8–27)
CO2: 25 mmol/L (ref 18–29)
Calcium: 9.1 mg/dL (ref 8.7–10.3)
Chloride: 104 mmol/L (ref 97–108)
Creatinine, Ser: 0.88 mg/dL (ref 0.57–1.00)
GFR calc Af Amer: 82 mL/min/{1.73_m2} (ref >59)
GFR calc non Af Amer: 71 mL/min/{1.73_m2} (ref >59)
Globulin, Total: 2.1 g/dL (ref 1.5–4.5)
Glucose: 94 mg/dL (ref 65–99)
Potassium: 4.2 mmol/L (ref 3.5–5.2)
Sodium: 143 mmol/L (ref 134–144)
Total Bilirubin: 0.2 mg/dL (ref 0.0–1.2)
Total Protein: 6 g/dL (ref 6.0–8.5)

## 2013-08-26 LAB — NMR, LIPOPROFILE
Cholesterol: 252 mg/dL — ABNORMAL HIGH (ref 100–199)
HDL Cholesterol by NMR: 56 mg/dL (ref >39)
HDL Particle Number: 39 umol/L (ref >=30.5)
LDL Particle Number: 2326 nmol/L — ABNORMAL HIGH (ref <1000)
LDL Size: 20.6 nm (ref >20.5)
LDLC SERPL CALC-MCNC: 171 mg/dL — ABNORMAL HIGH (ref 0–99)
LP-IR Score: 70 — ABNORMAL HIGH (ref <=45)
Small LDL Particle Number: 1197 nmol/L — ABNORMAL HIGH (ref <=527)
Triglycerides by NMR: 125 mg/dL (ref 0–149)

## 2013-09-01 ENCOUNTER — Telehealth: Payer: Self-pay | Admitting: Family Medicine

## 2013-09-01 ENCOUNTER — Telehealth: Payer: Self-pay | Admitting: Pharmacist

## 2013-09-01 MED ORDER — ROSUVASTATIN CALCIUM 10 MG PO TABS: 10.0000 mg | ORAL_TABLET | Freq: Every day | ORAL | Status: DC

## 2013-09-01 NOTE — Telephone Encounter (Signed)
rx printed and patient given coupon for #30 free

## 2013-09-01 NOTE — Telephone Encounter (Signed)
Currently taking crestor 10mg  every other day.  Has had difficulty tolerating statins in past.  Increase Crestor to 10 mg daily.  Recheck Lipids in 3 months.

## 2013-11-05 ENCOUNTER — Telehealth: Payer: Self-pay | Admitting: Family Medicine

## 2013-11-07 ENCOUNTER — Other Ambulatory Visit: Payer: Self-pay

## 2013-11-07 NOTE — Telephone Encounter (Signed)
Last seen 05/21/13 DWM  This med not on EPIC list

## 2013-11-08 MED ORDER — ALBUTEROL SULFATE HFA 108 (90 BASE) MCG/ACT IN AERS: 2.0000 | INHALATION_SPRAY | Freq: Four times a day (QID) | RESPIRATORY_TRACT | Status: DC | PRN

## 2013-11-26 ENCOUNTER — Ambulatory Visit (INDEPENDENT_AMBULATORY_CARE_PROVIDER_SITE_OTHER): Payer: 59

## 2013-11-26 DIAGNOSIS — Z23 Encounter for immunization: Secondary | ICD-10-CM

## 2013-12-21 ENCOUNTER — Other Ambulatory Visit: Payer: Self-pay | Admitting: Family Medicine

## 2013-12-31 ENCOUNTER — Other Ambulatory Visit: Payer: Self-pay | Admitting: *Deleted

## 2013-12-31 MED ORDER — ALBUTEROL SULFATE HFA 108 (90 BASE) MCG/ACT IN AERS: 2.0000 | INHALATION_SPRAY | Freq: Four times a day (QID) | RESPIRATORY_TRACT | Status: DC | PRN

## 2013-12-31 MED ORDER — AZITHROMYCIN 250 MG PO TABS: ORAL_TABLET | ORAL | Status: DC

## 2013-12-31 MED ORDER — ALBUTEROL SULFATE (2.5 MG/3ML) 0.083% IN NEBU: 2.5000 mg | INHALATION_SOLUTION | Freq: Four times a day (QID) | RESPIRATORY_TRACT | Status: DC | PRN

## 2014-01-13 ENCOUNTER — Ambulatory Visit (INDEPENDENT_AMBULATORY_CARE_PROVIDER_SITE_OTHER): Payer: 59

## 2014-01-13 ENCOUNTER — Ambulatory Visit (INDEPENDENT_AMBULATORY_CARE_PROVIDER_SITE_OTHER): Payer: 59 | Admitting: Physician Assistant

## 2014-01-13 ENCOUNTER — Encounter: Payer: Self-pay | Admitting: Physician Assistant

## 2014-01-13 VITALS — BP 136/86 | HR 84 | Temp 97.0°F | Ht 65.0 in | Wt 201.0 lb

## 2014-01-13 DIAGNOSIS — R05 Cough: Secondary | ICD-10-CM

## 2014-01-13 DIAGNOSIS — R059 Cough, unspecified: Secondary | ICD-10-CM

## 2014-01-13 DIAGNOSIS — J069 Acute upper respiratory infection, unspecified: Secondary | ICD-10-CM

## 2014-01-13 MED ORDER — BENZONATATE 200 MG PO CAPS: 200.0000 mg | ORAL_CAPSULE | Freq: Three times a day (TID) | ORAL | Status: DC | PRN

## 2014-01-13 MED ORDER — SULFAMETHOXAZOLE-TRIMETHOPRIM 800-160 MG PO TABS: 1.0000 | ORAL_TABLET | Freq: Two times a day (BID) | ORAL | Status: DC

## 2014-01-13 NOTE — Progress Notes (Signed)
Subjective:     Patient ID: Leah [name], female   DOB: 11/16/1950, 63 y.o.   MRN: [id number]  HPI Pt showed up 20 min late for her appt At that time she said she was having 2 issues- continued cough and vaginal issues I informed her we would not be able to address both today since she was so late for her appt Pt wanted to proceed with the cough She called the office ~ 3 wks ago and was given rx for Zpack Continued cough despite finishing med  Review of Systems  Constitutional: Positive for fatigue.  HENT: Positive for congestion, postnasal drip, rhinorrhea, sinus pressure, sore throat and voice change.   Respiratory: Positive for cough and wheezing.        Objective:   Physical Exam  Constitutional: She appears well-developed and well-nourished.  HENT:  Right Ear: External ear normal.  Left Ear: External ear normal.  Mouth/Throat: Oropharynx is clear and moist. No oropharyngeal exudate.  Neck: Neck supple.  Cardiovascular: Normal rate, regular rhythm and normal heart sounds.   Pulmonary/Chest: Effort normal and breath sounds normal.  Lymphadenopathy:    She has no cervical adenopathy.  Nursing note and vitals reviewed. CXR- nl     Assessment:     Acute URI    Plan:     Recent Zpack use and PCN allergy so change to Septra DS bid x 10 days Tessalon 200mg  tid prn cough Fluids Rest Continue Alb use prn F/U prn

## 2014-01-13 NOTE — Patient Instructions (Signed)
Upper Respiratory Infection, Adult An upper respiratory infection (URI) is also sometimes known as the common cold. The upper respiratory tract includes the nose, sinuses, throat, trachea, and bronchi. Bronchi are the airways leading to the lungs. Most people improve within 1 week, but symptoms can last up to 2 weeks. A residual cough may last even longer.  CAUSES Many different viruses can infect the tissues lining the upper respiratory tract. The tissues become irritated and inflamed and often become very moist. Mucus production is also common. A cold is contagious. You can easily spread the virus to others by oral contact. This includes kissing, sharing a glass, coughing, or sneezing. Touching your mouth or nose and then touching a surface, which is then touched by another person, can also spread the virus. SYMPTOMS  Symptoms typically develop 1 to 3 days after you come in contact with a cold virus. Symptoms vary from person to person. They may include:  Runny nose.  Sneezing.  Nasal congestion.  Sinus irritation.  Sore throat.  Loss of voice (laryngitis).  Cough.  Fatigue.  Muscle aches.  Loss of appetite.  Headache.  Low-grade fever. DIAGNOSIS  You might diagnose your own cold based on familiar symptoms, since most people get a cold 2 to 3 times a year. Your caregiver can confirm this based on your exam. Most importantly, your caregiver can check that your symptoms are not due to another disease such as strep throat, sinusitis, pneumonia, asthma, or epiglottitis. Blood tests, throat tests, and X-rays are not necessary to diagnose a common cold, but they may sometimes be helpful in excluding other more serious diseases. Your caregiver will decide if any further tests are required. RISKS AND COMPLICATIONS  You may be at risk for a more severe case of the common cold if you smoke cigarettes, have chronic heart disease (such as heart failure) or lung disease (such as asthma), or if  you have a weakened immune system. The very young and very old are also at risk for more serious infections. Bacterial sinusitis, middle ear infections, and bacterial pneumonia can complicate the common cold. The common cold can worsen asthma and chronic obstructive pulmonary disease (COPD). Sometimes, these complications can require emergency medical care and may be life-threatening. PREVENTION  The best way to protect against getting a cold is to practice good hygiene. Avoid oral or hand contact with people with cold symptoms. Wash your hands often if contact occurs. There is no clear evidence that vitamin C, vitamin E, echinacea, or exercise reduces the chance of developing a cold. However, it is always recommended to get plenty of rest and practice good nutrition. TREATMENT  Treatment is directed at relieving symptoms. There is no cure. Antibiotics are not effective, because the infection is caused by a virus, not by bacteria. Treatment may include:  Increased fluid intake. Sports drinks offer valuable electrolytes, sugars, and fluids.  Breathing heated mist or steam (vaporizer or shower).  Eating chicken soup or other clear broths, and maintaining good nutrition.  Getting plenty of rest.  Using gargles or lozenges for comfort.  Controlling fevers with ibuprofen or acetaminophen as directed by your caregiver.  Increasing usage of your inhaler if you have asthma. Zinc gel and zinc lozenges, taken in the first 24 hours of the common cold, can shorten the duration and lessen the severity of symptoms. Pain medicines may help with fever, muscle aches, and throat pain. A variety of non-prescription medicines are available to treat congestion and runny nose. Your caregiver   can make recommendations and may suggest nasal or lung inhalers for other symptoms.  HOME CARE INSTRUCTIONS   Only take over-the-counter or prescription medicines for pain, discomfort, or fever as directed by your  caregiver.  Use a warm mist humidifier or inhale steam from a shower to increase air moisture. This may keep secretions moist and make it easier to breathe.  Drink enough water and fluids to keep your urine clear or pale yellow.  Rest as needed.  Return to work when your temperature has returned to normal or as your caregiver advises. You may need to stay home longer to avoid infecting others. You can also use a face mask and careful hand washing to prevent spread of the virus. SEEK MEDICAL CARE IF:   After the first few days, you feel you are getting worse rather than better.  You need your caregiver's advice about medicines to control symptoms.  You develop chills, worsening shortness of breath, or brown or red sputum. These may be signs of pneumonia.  You develop yellow or brown nasal discharge or pain in the face, especially when you bend forward. These may be signs of sinusitis.  You develop a fever, swollen neck glands, pain with swallowing, or white areas in the back of your throat. These may be signs of strep throat. SEEK IMMEDIATE MEDICAL CARE IF:   You have a fever.  You develop severe or persistent headache, ear pain, sinus pain, or chest pain.  You develop wheezing, a prolonged cough, cough up blood, or have a change in your usual mucus (if you have chronic lung disease).  You develop sore muscles or a stiff neck. Document Released: 06/28/2000 Document Revised: 03/27/2011 Document Reviewed: 04/09/2013 [employer] Patient Information 2015 [employer]. This information is not intended to replace advice given to you by your health care provider. Make sure you discuss any questions you have with your health care provider.  

## 2014-01-23 ENCOUNTER — Telehealth: Payer: Self-pay | Admitting: Family Medicine

## 2014-01-23 ENCOUNTER — Ambulatory Visit (INDEPENDENT_AMBULATORY_CARE_PROVIDER_SITE_OTHER): Payer: 59 | Admitting: Family Medicine

## 2014-01-23 ENCOUNTER — Encounter: Payer: Self-pay | Admitting: Family Medicine

## 2014-01-23 VITALS — BP 116/74 | HR 90 | Temp 98.9°F | Ht 65.0 in | Wt 197.0 lb

## 2014-01-23 DIAGNOSIS — J206 Acute bronchitis due to rhinovirus: Secondary | ICD-10-CM

## 2014-01-23 MED ORDER — PREDNISONE 10 MG PO TABS: ORAL_TABLET | ORAL | Status: DC

## 2014-01-23 MED ORDER — LEVOFLOXACIN 500 MG PO TABS: 500.0000 mg | ORAL_TABLET | Freq: Every day | ORAL | Status: DC

## 2014-01-23 MED ORDER — METHYLPREDNISOLONE ACETATE 80 MG/ML IJ SUSP
80.0000 mg | Freq: Once | INTRAMUSCULAR | Status: AC
  Administered 2014-01-23: 80 mg via INTRAMUSCULAR

## 2014-01-23 MED ORDER — BENZONATATE 100 MG PO CAPS: ORAL_CAPSULE | ORAL | Status: DC

## 2014-01-23 NOTE — Telephone Encounter (Signed)
Please schedule the patient for an appointment to come back in and be rechecked, she may need a CBC, chest x-ray and a different antibiotic

## 2014-01-23 NOTE — Telephone Encounter (Signed)
Dr Laurance Flatten, please address - see Wendy's note

## 2014-01-23 NOTE — Telephone Encounter (Signed)
Please follow through with this request

## 2014-01-23 NOTE — Telephone Encounter (Signed)
Per [facility] - NT[facility] _ pt aware and has a appt for tonight with Bill

## 2014-01-23 NOTE — Progress Notes (Signed)
   Subjective:    Patient ID: Leah [name], female    DOB: 11-23-1950, 64 y.o.   MRN: [id number]  HPI Patient is here with c/o uri sx's.   Review of Systems  Constitutional: Negative for fever.  HENT: Negative for ear pain.   Eyes: Negative for discharge.  Respiratory: Negative for cough.   Cardiovascular: Negative for chest pain.  Gastrointestinal: Negative for abdominal distention.  Endocrine: Negative for polyuria.  Genitourinary: Negative for difficulty urinating.  Musculoskeletal: Negative for gait problem and neck pain.  Skin: Negative for color change and rash.  Neurological: Negative for speech difficulty and headaches.  Psychiatric/Behavioral: Negative for agitation.       Objective:    BP 116/74 mmHg  Pulse 90  Temp(Src) 98.9 F (37.2 C) (Oral)  Ht 5\' 5"  (1.651 m)  Wt 197 lb (89.359 kg)  BMI 32.78 kg/m2 Physical Exam  Constitutional: She is oriented to person, place, and time. She appears well-developed and well-nourished.  HENT:  Head: Normocephalic and atraumatic.  Mouth/Throat: Oropharynx is clear and moist.  Eyes: Pupils are equal, round, and reactive to light.  Neck: Normal range of motion. Neck supple.  Cardiovascular: Normal rate and regular rhythm.   No murmur heard. Pulmonary/Chest: No respiratory distress. She has wheezes.  Abdominal: Soft. Bowel sounds are normal. There is no tenderness.  Neurological: She is alert and oriented to person, place, and time.  Skin: Skin is warm and dry.  Psychiatric: She has a normal mood and affect.          Assessment & Plan:     ICD-9-CM ICD-10-CM   1. Acute bronchitis due to Rhinovirus 466.0 J20.6 levofloxacin (LEVAQUIN) 500 MG tablet   079.3  predniSONE (DELTASONE) 10 MG tablet     benzonatate (TESSALON PERLES) 100 MG capsule     methylPREDNISolone acetate (DEPO-MEDROL) injection 80 mg   Push po fluids, rest, tylenol and motrin otc prn as directed for fever, arthralgias, and myalgias.  Follow up  prn if sx's continue or persist.  No Follow-up on file.  Lysbeth Penner FNP

## 2014-01-23 NOTE — Telephone Encounter (Signed)
Patient states that she is coughing up a lot of mucous. She was given tessalon pearles and Bactrim. She has also takes 3 rounds of mucinex.

## 2014-01-26 ENCOUNTER — Other Ambulatory Visit: Payer: Self-pay | Admitting: Family Medicine

## 2014-01-26 ENCOUNTER — Telehealth: Payer: Self-pay | Admitting: Family Medicine

## 2014-01-26 MED ORDER — AZITHROMYCIN 250 MG PO TABS: ORAL_TABLET | ORAL | Status: DC

## 2014-01-26 NOTE — Telephone Encounter (Signed)
zpak abx sent to her pharmacy

## 2014-01-26 NOTE — Telephone Encounter (Signed)
Left message on home phone; cell # does not have voicemail set up

## 2014-02-03 NOTE — Addendum Note (Signed)
Addended by: Marin Olp on: 02/03/2014 06:41 PM   Modules accepted: Miquel Dunn

## 2014-02-10 ENCOUNTER — Other Ambulatory Visit (INDEPENDENT_AMBULATORY_CARE_PROVIDER_SITE_OTHER): Payer: 59

## 2014-02-10 ENCOUNTER — Encounter (INDEPENDENT_AMBULATORY_CARE_PROVIDER_SITE_OTHER): Payer: Self-pay

## 2014-02-10 DIAGNOSIS — I10 Essential (primary) hypertension: Secondary | ICD-10-CM

## 2014-02-10 DIAGNOSIS — E559 Vitamin D deficiency, unspecified: Secondary | ICD-10-CM

## 2014-02-10 DIAGNOSIS — E785 Hyperlipidemia, unspecified: Secondary | ICD-10-CM

## 2014-02-10 DIAGNOSIS — K219 Gastro-esophageal reflux disease without esophagitis: Secondary | ICD-10-CM

## 2014-02-10 LAB — POCT CBC
Granulocyte percent: 67 %G (ref 37–80)
HCT, POC: 40.2 % (ref 37.7–47.9)
Hemoglobin: 12.7 g/dL (ref 12.2–16.2)
Lymph, poc: 1.5 (ref 0.6–3.4)
MCH, POC: 26.2 pg — AB (ref 27–31.2)
MCHC: 31.5 g/dL — AB (ref 31.8–35.4)
MCV: 83.2 fL (ref 80–97)
MPV: 7.5 fL (ref 0–99.8)
POC Granulocyte: 3.2 (ref 2–6.9)
POC LYMPH PERCENT: 31 %L (ref 10–50)
Platelet Count, POC: 269 10*3/uL (ref 142–424)
RBC: 4.8 M/uL (ref 4.04–5.48)
RDW, POC: 13.8 %
WBC: 4.8 10*3/uL (ref 4.6–10.2)

## 2014-02-10 NOTE — Progress Notes (Signed)
Lab only 

## 2014-02-11 LAB — BMP8+EGFR
BUN/Creatinine Ratio: 18 (ref 11–26)
BUN: 17 mg/dL (ref 8–27)
CO2: 28 mmol/L (ref 18–29)
Calcium: 8.9 mg/dL (ref 8.7–10.3)
Chloride: 101 mmol/L (ref 97–108)
Creatinine, Ser: 0.92 mg/dL (ref 0.57–1.00)
GFR calc Af Amer: 77 mL/min/{1.73_m2} (ref >59)
GFR calc non Af Amer: 67 mL/min/{1.73_m2} (ref >59)
Glucose: 92 mg/dL (ref 65–99)
Potassium: 4.3 mmol/L (ref 3.5–5.2)
Sodium: 143 mmol/L (ref 134–144)

## 2014-02-11 LAB — NMR, LIPOPROFILE
Cholesterol: 314 mg/dL — ABNORMAL HIGH (ref 100–199)
HDL Cholesterol by NMR: 53 mg/dL (ref >39)
HDL Particle Number: 32.6 umol/L (ref >=30.5)
LDL Particle Number: 3372 nmol/L — ABNORMAL HIGH (ref <1000)
LDL Size: 20.4 nm (ref >20.5)
LDL-C: 221 mg/dL — ABNORMAL HIGH (ref 0–99)
LP-IR Score: 84 — ABNORMAL HIGH (ref <=45)
Small LDL Particle Number: 2008 nmol/L — ABNORMAL HIGH (ref <=527)
Triglycerides by NMR: 202 mg/dL — ABNORMAL HIGH (ref 0–149)

## 2014-02-11 LAB — HEPATIC FUNCTION PANEL
ALT: 35 IU/L — ABNORMAL HIGH (ref 0–32)
AST: 18 IU/L (ref 0–40)
Albumin: 3.9 g/dL (ref 3.6–4.8)
Alkaline Phosphatase: 63 IU/L (ref 39–117)
Bilirubin, Direct: 0.05 mg/dL (ref 0.00–0.40)
Total Bilirubin: 0.2 mg/dL (ref 0.0–1.2)
Total Protein: 6.2 g/dL (ref 6.0–8.5)

## 2014-02-11 LAB — VITAMIN D 25 HYDROXY (VIT D DEFICIENCY, FRACTURES): Vit D, 25-Hydroxy: 31.6 ng/mL (ref 30.0–100.0)

## 2014-02-12 ENCOUNTER — Ambulatory Visit (INDEPENDENT_AMBULATORY_CARE_PROVIDER_SITE_OTHER): Payer: 59

## 2014-02-12 ENCOUNTER — Ambulatory Visit (INDEPENDENT_AMBULATORY_CARE_PROVIDER_SITE_OTHER): Payer: 59 | Admitting: Family Medicine

## 2014-02-12 ENCOUNTER — Encounter: Payer: Self-pay | Admitting: Family Medicine

## 2014-02-12 VITALS — BP 116/75 | HR 88 | Temp 97.6°F | Ht 65.0 in | Wt 200.0 lb

## 2014-02-12 DIAGNOSIS — Z13820 Encounter for screening for osteoporosis: Secondary | ICD-10-CM

## 2014-02-12 DIAGNOSIS — Z0000 Encounter for general adult medical examination without abnormal findings: Secondary | ICD-10-CM

## 2014-02-12 DIAGNOSIS — F32A Depression, unspecified: Secondary | ICD-10-CM

## 2014-02-12 DIAGNOSIS — R1012 Left upper quadrant pain: Secondary | ICD-10-CM

## 2014-02-12 DIAGNOSIS — E785 Hyperlipidemia, unspecified: Secondary | ICD-10-CM

## 2014-02-12 DIAGNOSIS — I10 Essential (primary) hypertension: Secondary | ICD-10-CM

## 2014-02-12 DIAGNOSIS — J9809 Other diseases of bronchus, not elsewhere classified: Secondary | ICD-10-CM

## 2014-02-12 DIAGNOSIS — F329 Major depressive disorder, single episode, unspecified: Secondary | ICD-10-CM

## 2014-02-12 DIAGNOSIS — Z780 Asymptomatic menopausal state: Secondary | ICD-10-CM

## 2014-02-12 DIAGNOSIS — E559 Vitamin D deficiency, unspecified: Secondary | ICD-10-CM

## 2014-02-12 DIAGNOSIS — K219 Gastro-esophageal reflux disease without esophagitis: Secondary | ICD-10-CM

## 2014-02-12 MED ORDER — ESCITALOPRAM OXALATE 10 MG PO TABS: 10.0000 mg | ORAL_TABLET | Freq: Every day | ORAL | Status: DC

## 2014-02-12 MED ORDER — LISINOPRIL-HYDROCHLOROTHIAZIDE 20-12.5 MG PO TABS: 1.0000 | ORAL_TABLET | Freq: Every day | ORAL | Status: DC

## 2014-02-12 MED ORDER — OMEPRAZOLE 20 MG PO CPDR: 20.0000 mg | DELAYED_RELEASE_CAPSULE | Freq: Every day | ORAL | Status: DC

## 2014-02-12 MED ORDER — ROSUVASTATIN CALCIUM 20 MG PO TABS: 20.0000 mg | ORAL_TABLET | Freq: Every day | ORAL | Status: DC

## 2014-02-12 NOTE — Progress Notes (Signed)
Subjective:    Patient ID: Leah [name], female    DOB: 1950/09/06, 64 y.o.   MRN: [id number]  HPI Patient is here today for annual wellness exam and follow up of chronic medical problems which include hypertension, hyperlipidemia, and GERD. She is taking her medications regularly. The patient is complaining of cough and congestion. She is also complaining of mid low back pain and left-sided abdominal pain. A previous chest x-ray, LS spine, and CT scan of the abdomen were reviewed today with the patient. The patient is improved with her cold and cough but it is still lingering and she attributes some of her abdominal pain and back pain to the recurring cough that she has had.         Patient Active Problem List   Diagnosis Date Noted  . Adjustment disorder with mixed anxiety and depressed mood 03/25/2013  . Abdominal pain, chronic, epigastric 11/12/2012  . Nausea alone 11/12/2012  . Personal history of colonic polyps 11/12/2012  . Hypertension, benign essential, goal below 140/90 10/30/2012  . Hyperlipidemia 10/22/2012  . GERD (gastroesophageal reflux disease) 10/22/2012  . Unspecified asthma(493.90) 10/22/2012   Outpatient Encounter Prescriptions as of 02/12/2014  Medication Sig  . albuterol (PROVENTIL HFA;VENTOLIN HFA) 108 (90 BASE) MCG/ACT inhaler Inhale 2 puffs into the lungs every 6 (six) hours as needed for wheezing or shortness of breath.  Marland Kitchen albuterol (PROVENTIL) (2.5 MG/3ML) 0.083% nebulizer solution Take 3 mLs (2.5 mg total) by nebulization every 6 (six) hours as needed for wheezing or shortness of breath.  Marland Kitchen aspirin 81 MG tablet Take 81 mg by mouth daily.  . benzonatate (TESSALON PERLES) 100 MG capsule Take one po tid for 3 days then one po tid prn  . Cholecalciferol (VITAMIN D3) 5000 UNITS TABS Take 1 tablet by mouth daily.  Marland Kitchen escitalopram (LEXAPRO) 10 MG tablet TAKE ONE TABLET BY MOUTH ONCE DAILY  . fish oil-omega-3 fatty acids 1000 MG capsule Take 1 g by mouth  daily.   Marland Kitchen lisinopril-hydrochlorothiazide (PRINZIDE,ZESTORETIC) 20-12.5 MG per tablet TAKE ONE TABLET BY MOUTH ONCE DAILY  . omeprazole (PRILOSEC) 20 MG capsule Take 20 mg by mouth daily as needed.  . rosuvastatin (CRESTOR) 10 MG tablet Take 1 tablet (10 mg total) by mouth daily.  . [DISCONTINUED] azithromycin (ZITHROMAX) 250 MG tablet Take 2 po first day and then one po qd x 4 days  . [DISCONTINUED] levofloxacin (LEVAQUIN) 500 MG tablet Take 1 tablet (500 mg total) by mouth daily.  . [DISCONTINUED] predniSONE (DELTASONE) 10 MG tablet 4po qd x 2 days then 3 po qd x 2 days then 2 po qd x 2 days then 1 po qd x 2 days then stop    Review of Systems  Constitutional: Negative.   HENT: Positive for congestion.   Eyes: Negative.   Respiratory: Positive for cough.   Cardiovascular: Negative.   Gastrointestinal: Positive for abdominal pain (left side abd pain).  Endocrine: Negative.   Genitourinary: Negative.   Musculoskeletal: Positive for back pain (mid back pains).  Skin: Negative.   Allergic/Immunologic: Negative.   Neurological: Negative.   Hematological: Negative.   Psychiatric/Behavioral: Negative.        Objective:   Physical Exam  Constitutional: She is oriented to person, place, and time. She appears well-developed and well-nourished. No distress.  The patient is pleasant and alert.  HENT:  Head: Normocephalic and atraumatic.  Right Ear: External ear normal.  Left Ear: External ear normal.  Nose: Nose normal.  Mouth/Throat: Oropharynx  is clear and moist.  Eyes: Conjunctivae and EOM are normal. Pupils are equal, round, and reactive to light. Right eye exhibits no discharge. Left eye exhibits no discharge. No scleral icterus.  Neck: Normal range of motion. Neck supple. No thyromegaly present.  No carotid bruits or thyromegaly  Cardiovascular: Normal rate, regular rhythm, normal heart sounds and intact distal pulses.   No murmur heard. The heart is regular at 72/m    Pulmonary/Chest: Effort normal and breath sounds normal. No respiratory distress. She has no wheezes. She has no rales. She exhibits no tenderness.  Lungs are clear anteriorly and posteriorly  Abdominal: Soft. Bowel sounds are normal. She exhibits no mass. There is tenderness. There is no rebound and no guarding.  There is slight tenderness at the left costal margin in the left upper quadrant.  Musculoskeletal: Normal range of motion. She exhibits no edema or tenderness.  Lymphadenopathy:    She has no cervical adenopathy.  Neurological: She is alert and oriented to person, place, and time. She has normal reflexes. No cranial nerve deficit.  Skin: Skin is warm and dry. No rash noted.  Psychiatric: She has a normal mood and affect. Her behavior is normal. Judgment and thought content normal.  Nursing note and vitals reviewed.  BP 116/75 mmHg  Pulse 88  Temp(Src) 97.6 F (36.4 C) (Oral)  Ht 5\' 5"  (1.651 m)  Wt 200 lb (90.719 kg)  BMI 33.28 kg/m2  [facility] reading (PRIMARY) by  Dr. Louretta Parma abdomen and left costal margin--no specific abnormality noted other than mesh from previous surgery                                        Assessment & Plan:  1. Essential hypertension -The patient should continue with her current blood pressure medication and continue to avoid excess sodium in her diet  2. Vitamin D deficiency -The patient is going to check her vitamin D and let us know the current strength as her level was at the lower end of the normal range.  3. Gastroesophageal reflux disease, esophagitis presence not specified -Continue to avoid NSAIDs and continue with omeprazole as her GERD is stable  4. Annual physical exam -Due to left upper quadrant pain and sensitivity we will get x-rays of this area before she leaves office today.  5. Hyperlipidemia -The patient has not been taking her Crestor on a regular basis because of the cost and expense -The severely elevated numbers were  reviewed with her and it was stressed how important it was for her to take her medication and follow-up aggressive therapeutic lifestyle changes An appointment will be made with the clinical pharmacist for further education The patient will be given a $3 for 3 months prescription of Crestor-  6. Screening for osteoporosis -The patient is past due for her DEXA scan and this will be arranged - [facility] Density; Future  7. Postmenopausal -We will also make sure that she gets her pelvic exam done in November. - [facility] Bone Density; Future  8. Depression -Continue with Lexapro  9. Recurrent bronchospasm -Continue with good pulmonary hygiene and when necessary use of inhalers  10. Abdominal pain, left upper quadrant -Spot films today  Meds ordered this encounter  Medications  . rosuvastatin (CRESTOR) 20 MG tablet    Sig: Take 1 tablet (20 mg total) by mouth daily.    Dispense:  90 tablet  Refill:  1  . escitalopram (LEXAPRO) 10 MG tablet    Sig: Take 1 tablet (10 mg total) by mouth daily.    Dispense:  90 tablet    Refill:  3    Needs an appointment  . lisinopril-hydrochlorothiazide (PRINZIDE,ZESTORETIC) 20-12.5 MG per tablet    Sig: Take 1 tablet by mouth daily.    Dispense:  90 tablet    Refill:  3  . omeprazole (PRILOSEC) 20 MG capsule    Sig: Take 1 capsule (20 mg total) by mouth daily.    Dispense:  90 capsule    Refill:  3   Patient Instructions  Continue current medications. Continue good therapeutic lifestyle changes which include good diet and exercise. Fall precautions discussed with patient. If an FOBT was given today- please return it to our front desk. If you are over 55 years old - you may need Prevnar 100 or the adult Pneumonia vaccine.  Flu Shots will be available at our office starting mid- September. Please call and schedule a FLU CLINIC APPOINTMENT.   The patient must take her Crestor regularly. She has stopped it because of the cost. We will give her a new  prescription with a coupon to help save money so that she can get a 3 month supply for $3.--- We will still ask her to come back and speak with the clinical pharmacist regarding therapeutic lifestyle changes regarding this elevated cholesterol issue The patient should do good pulmonary hygiene, this includes Mucinex plain maximum strength 1 twice daily for cough and congestion, increased humidification in the home, keep the home as cool as possible, and drink plenty of fluids. Continue to exercise as much as possible   Arrie Senate MD

## 2014-02-12 NOTE — Patient Instructions (Addendum)
Continue current medications. Continue good therapeutic lifestyle changes which include good diet and exercise. Fall precautions discussed with patient. If an FOBT was given today- please return it to our front desk. If you are over 63 years old - you may need Prevnar 80 or the adult Pneumonia vaccine.  Flu Shots will be available at our office starting mid- September. Please call and schedule a FLU CLINIC APPOINTMENT.   The patient must take her Crestor regularly. She has stopped it because of the cost. We will give her a new prescription with a coupon to help save money so that she can get a 3 month supply for $3.--- We will still ask her to come back and speak with the clinical pharmacist regarding therapeutic lifestyle changes regarding this elevated cholesterol issue The patient should do good pulmonary hygiene, this includes Mucinex plain maximum strength 1 twice daily for cough and congestion, increased humidification in the home, keep the home as cool as possible, and drink plenty of fluids. Continue to exercise as much as possible

## 2014-02-25 ENCOUNTER — Encounter: Payer: Self-pay | Admitting: *Deleted

## 2014-03-25 ENCOUNTER — Telehealth: Payer: Self-pay | Admitting: Family Medicine

## 2014-03-25 DIAGNOSIS — I10 Essential (primary) hypertension: Secondary | ICD-10-CM

## 2014-03-25 NOTE — Telephone Encounter (Signed)
Please refer patient to Dr. Kathrin Penner as requested

## 2014-03-25 NOTE — Telephone Encounter (Signed)
Please review and advise.

## 2014-04-22 ENCOUNTER — Ambulatory Visit (INDEPENDENT_AMBULATORY_CARE_PROVIDER_SITE_OTHER): Payer: 59 | Admitting: Pharmacist

## 2014-04-22 ENCOUNTER — Ambulatory Visit (INDEPENDENT_AMBULATORY_CARE_PROVIDER_SITE_OTHER): Payer: 59

## 2014-04-22 VITALS — BP 135/85 | HR 75 | Ht 63.0 in | Wt 198.0 lb

## 2014-04-22 DIAGNOSIS — F32A Depression, unspecified: Secondary | ICD-10-CM

## 2014-04-22 DIAGNOSIS — Z13820 Encounter for screening for osteoporosis: Secondary | ICD-10-CM | POA: Diagnosis not present

## 2014-04-22 DIAGNOSIS — F329 Major depressive disorder, single episode, unspecified: Secondary | ICD-10-CM

## 2014-04-22 DIAGNOSIS — K219 Gastro-esophageal reflux disease without esophagitis: Secondary | ICD-10-CM

## 2014-04-22 DIAGNOSIS — Z780 Asymptomatic menopausal state: Secondary | ICD-10-CM

## 2014-04-22 MED ORDER — CALCIUM CARBONATE ANTACID 500 MG PO CHEW: 1.0000 | CHEWABLE_TABLET | ORAL | Status: AC | PRN

## 2014-04-22 NOTE — Patient Instructions (Signed)
Fall Prevention and Home Safety Falls cause injuries and can affect all age groups. It is possible to use preventive measures to significantly decrease the likelihood of falls. There are many simple measures which can make your home safer and prevent falls. OUTDOORS  Repair cracks and edges of walkways and driveways.  Remove high doorway thresholds.  Trim shrubbery on the main path into your home.  Have good outside lighting.  Clear walkways of tools, rocks, debris, and clutter.  Check that handrails are not broken and are securely fastened. Both sides of steps should have handrails.  Have leaves, snow, and ice cleared regularly.  Use sand or salt on walkways during winter months.  In the garage, clean up grease or oil spills. BATHROOM  Install night lights.  Install grab bars by the toilet and in the tub and shower.  Use non-skid mats or decals in the tub or shower.  Place a plastic non-slip stool in the shower to sit on, if needed.  Keep floors dry and clean up all water on the floor immediately.  Remove soap buildup in the tub or shower on a regular basis.  Secure bath mats with non-slip, double-sided rug tape.  Remove throw rugs and tripping hazards from the floors. BEDROOMS  Install night lights.  Make sure a bedside light is easy to reach.  Do not use oversized bedding.  Keep a telephone by your bedside.  Have a firm chair with side arms to use for getting dressed.  Remove throw rugs and tripping hazards from the floor. KITCHEN  Keep handles on pots and pans turned toward the center of the stove. Use back burners when possible.  Clean up spills quickly and allow time for drying.  Avoid walking on wet floors.  Avoid hot utensils and knives.  Position shelves so they are not too high or low.  Place commonly used objects within easy reach.  If necessary, use a sturdy step stool with a grab bar when reaching.  Keep electrical cables out of the  way.  Do not use floor polish or wax that makes floors slippery. If you must use wax, use non-skid floor wax.  Remove throw rugs and tripping hazards from the floor. STAIRWAYS  Never leave objects on stairs.  Place handrails on both sides of stairways and use them. Fix any loose handrails. Make sure handrails on both sides of the stairways are as long as the stairs.  Check carpeting to make sure it is firmly attached along stairs. Make repairs to worn or loose carpet promptly.  Avoid placing throw rugs at the top or bottom of stairways, or properly secure the rug with carpet tape to prevent slippage. Get rid of throw rugs, if possible.  Have an electrician put in a light switch at the top and bottom of the stairs. OTHER FALL PREVENTION TIPS  Wear low-heel or rubber-soled shoes that are supportive and fit well. Wear closed toe shoes.  When using a stepladder, make sure it is fully opened and both spreaders are firmly locked. Do not climb a closed stepladder.  Add color or contrast paint or tape to grab bars and handrails in your home. Place contrasting color strips on first and last steps.  Learn and use mobility aids as needed. Install an electrical emergency response system.  Turn on lights to avoid dark areas. Replace light bulbs that burn out immediately. Get light switches that glow.  Arrange furniture to create clear pathways. Keep furniture in the same place.    Firmly attach carpet with non-skid or double-sided tape.  Eliminate uneven floor surfaces.  Select a carpet pattern that does not visually hide the edge of steps.  Be aware of all pets. OTHER HOME SAFETY TIPS  Set the water temperature for 120 F (48.8 C).  Keep emergency numbers on or near the telephone.  Keep smoke detectors on every level of the home and near sleeping areas. Document Released: 12/23/2001 Document Revised: 07/04/2011 Document Reviewed: 03/24/2011 [employer] Patient Information 2015  [employer]. This information is not intended to replace advice given to you by your health care provider. Make sure you discuss any questions you have with your health care provider.                Exercise for Strong Bones  Exercise is important to build and maintain strong bones / bone density.  There are 2 types of exercises that are important to building and maintaining strong bones:  Weight- bearing and muscle-stregthening.  Weight-bearing Exercises  These exercises include activities that make you move against gravity while staying upright. Weight-bearing exercises can be high-impact or low-impact.  High-impact weight-bearing exercises help build bones and keep them strong. If you have broken a bone due to osteoporosis or are at risk of breaking a bone, you may need to avoid high-impact exercises. If you're not sure, you should check with your healthcare provider.  Examples of high-impact weight-bearing exercises are: Dancing  Doing high-impact aerobics  Hiking  Jogging/running  Jumping Rope  Stair climbing  Tennis  Low-impact weight-bearing exercises can also help keep bones strong and are a safe alternative if you cannot do high-impact exercises.   Examples of low-impact weight-bearing exercises are: Using elliptical training machines  Doing low-impact aerobics  Using stair-step machines  Fast walking on a treadmill or outside   Muscle-Strengthening Exercises These exercises include activities where you move your body, a weight or some other resistance against gravity. They are also known as resistance exercises and include: Lifting weights  Using elastic exercise bands  Using weight machines  Lifting your own body weight  Functional movements, such as standing and rising up on your toes  Yoga and Pilates can also improve strength, balance and flexibility. However, certain positions may not be safe for people with osteoporosis or those at increased risk of broken  bones. For example, exercises that have you bend forward may increase the chance of breaking a bone in the spine.   Non-Impact Exercises There are other types of exercises that can help prevent falls.  Non-impact exercises can help you to improve balance, posture and how well you move in everyday activities. Some of these exercises include: Balance exercises that strengthen your legs and test your balance, such as Tai Chi, can decrease your risk of falls.  Posture exercises that improve your posture and reduce rounded or "sloping" shoulders can help you decrease the chance of breaking a bone, especially in the spine.  Functional exercises that improve how well you move can help you with everyday activities and decrease your chance of falling and breaking a bone. For example, if you have trouble getting up from a chair or climbing stairs, you should do these activities as exercises.   **A physical therapist can teach you balance, posture and functional exercises. He/she can also help you learn which exercises are safe and appropriate for you.  [facility] has a physical therapy office in [place] in front of our office and referrals can be made for assessments   and treatment as needed and strength and balance training.  If you would like to have an assessment with [facility] and our physical therapy team please let a nurse or provider know.    

## 2014-04-22 NOTE — Progress Notes (Signed)
Patient ID: Leah [name], female   DOB: 16-Dec-1950, 64 y.o.   MRN: [id number]  [facility] Current Height: Height: 5\' 4"  (162.6 cm)   Max Lifetime Height:  64 Current Weight: Weight: 198 lb (89.812 kg)  198 lbs     Ethnicity:African American  BP: BP: 135/85 mmHg     HR:  Pulse Rate: 75      HPI: Does pt already have a diagnosis of:  Osteopenia?  No Osteoporosis?  No  Back Pain?  No       Kyphosis?  No Prior fracture?  No Med(s) for Osteoporosis/Osteopenia:  none Med(s) previously tried for Osteoporosis/Osteopenia:  none                                                             PMH: Age at menopause:  3 Hysterectomy?  Yes Oophorectomy?  No HRT? No Steroid Use?  No Thyroid med?  No History of cancer?  No History of digestive disorders (ie Crohn's)?  Yes - patient had dx of GERD but she is only taking omeprazole as needed which has been only about 3 times in the lats 3-4 months Current or previous eating disorders?  No Last Vitamin D Result:  31.6 (02/10/2014) Last GFR Result:  67 (02/10/2014)   FH/SH: Family history of osteoporosis?  No Parent with history of hip fracture?  No Family history of breast cancer?  No Exercise?  No -but very active lifestyle - gardening and yardwork Smoking?  No Alcohol?  Yes - 1 glass of wine several times per week    Calcium Assessment Calcium Intake  # of servings/day  Calcium mg  Milk (8 oz) 0  x  300  = 0  Almond milk  1/2 (in smoothie)  225mg   Yogurt (4 oz) 0 x  200 = 0  Cheese (1 oz) 0 x  200 = 0  Other Calcium sources   250mg   Ca supplement 0 = 0   Estimated calcium intake per day 475mg     DEXA Results Date of Test T-Score for AP Spine L1-L4 T-Score for Total Left Hip T-Score for Total Right Hip  04/22/2014 0.8 1.5 1.7                  Assessment: Normal Bone density Depression - patient has not taken escitalpram in 2 weeks - denies any s/s of depression GERD - not taking PPI any longer and no increase in  symptoms  Recommendations: 1.   Discussed BMD  / DEXA results and discussed fracture risk. 2.  recommend calcium 1200mg  daily through supplementation or diet.  3.  recommend weight bearing exercise - 30 minutes at least 4 days per week.   4.  Counseled and educated about fall risk and prevention. 5.  Discontinue escitalopram or omeprazole.  Patient reminded for symptoms of depression and to contact office is she experiences any symptoms.  Patient advised to take tums as needed for GERD.    Recheck DEXA:  5 years  Time spent counseling patient:  30 minutes  Cherre Robins, PharmD, CPP

## 2014-06-02 ENCOUNTER — Ambulatory Visit (INDEPENDENT_AMBULATORY_CARE_PROVIDER_SITE_OTHER): Payer: 59

## 2014-06-02 ENCOUNTER — Telehealth: Payer: Self-pay | Admitting: Family Medicine

## 2014-06-02 ENCOUNTER — Encounter (INDEPENDENT_AMBULATORY_CARE_PROVIDER_SITE_OTHER): Payer: Self-pay

## 2014-06-02 ENCOUNTER — Encounter: Payer: Self-pay | Admitting: Family Medicine

## 2014-06-02 ENCOUNTER — Ambulatory Visit (INDEPENDENT_AMBULATORY_CARE_PROVIDER_SITE_OTHER): Payer: 59 | Admitting: Family Medicine

## 2014-06-02 VITALS — BP 133/81 | HR 75 | Temp 97.6°F | Ht 63.0 in | Wt 195.0 lb

## 2014-06-02 DIAGNOSIS — R195 Other fecal abnormalities: Secondary | ICD-10-CM | POA: Diagnosis not present

## 2014-06-02 DIAGNOSIS — R109 Unspecified abdominal pain: Secondary | ICD-10-CM

## 2014-06-02 DIAGNOSIS — R05 Cough: Secondary | ICD-10-CM

## 2014-06-02 DIAGNOSIS — R112 Nausea with vomiting, unspecified: Secondary | ICD-10-CM | POA: Diagnosis not present

## 2014-06-02 DIAGNOSIS — R059 Cough, unspecified: Secondary | ICD-10-CM

## 2014-06-02 LAB — POCT CBC
Granulocyte percent: 53.4 %G (ref 37–80)
HCT, POC: 41.6 % (ref 37.7–47.9)
Hemoglobin: 13 g/dL (ref 12.2–16.2)
Lymph, poc: 1.3 (ref 0.6–3.4)
MCH, POC: 26 pg — AB (ref 27–31.2)
MCHC: 31.2 g/dL — AB (ref 31.8–35.4)
MCV: 83.4 fL (ref 80–97)
MPV: 6.9 fL (ref 0–99.8)
POC Granulocyte: 1.7 — AB (ref 2–6.9)
POC LYMPH PERCENT: 40.5 %L (ref 10–50)
Platelet Count, POC: 275 10*3/uL (ref 142–424)
RBC: 4.99 M/uL (ref 4.04–5.48)
RDW, POC: 13.6 %
WBC: 3.1 10*3/uL — AB (ref 4.6–10.2)

## 2014-06-02 NOTE — Telephone Encounter (Signed)
Pt is having intermitent abdominal pain with stool that looks like coffee grounds appt scheduled with [facility] today at 2:00

## 2014-06-02 NOTE — Patient Instructions (Signed)
The patient should take Zantac 150 over-the-counter twice daily before breakfast and supper until we recheck her. She should avoid caffeine fried foods and milk and dairy products as much as possible She should return the FOBT We will get a CT scan of her abdomen and a chest x-ray in the office today.

## 2014-06-02 NOTE — Progress Notes (Signed)
 Subjective:    Patient ID: Leah [name], female    DOB: 10/15/1950, 64 y.o.   MRN: [id number]  HPI Patient here today for nausea, vomiting, abdominal pain and dark colored stools. Some of this has been going on for quite awhile, but has gotten worse in the last few days. The pain is mostly in the upper abdomen and it seems to be worse on her left side. At nighttime she has nausea and vomiting and the stools have been black colored. The abdominal pain has been going on for a few months off and on but the pain has gotten worse recently. She's had more nausea and more vomiting and the vomitus is quite and not coffee-ground. She says she has a history of pancreatitis in the past. We reviewed the previous endoscopy and colonoscopy and she had diverticulosis on the colonoscopy and was placed on a reflux diet after doing the endoscopy. This was done less than 2 years ago. She is also had a slight cough and there is some discomfort with taking a deep breath. The bowel movements have been normal in caliber.        Patient Active Problem List   Diagnosis Date Noted  . Adjustment disorder with mixed anxiety and depressed mood 03/25/2013  . Abdominal pain, chronic, epigastric 11/12/2012  . Nausea alone 11/12/2012  . Personal history of colonic polyps 11/12/2012  . Hypertension, benign essential, goal below 140/90 10/30/2012  . Hyperlipidemia 10/22/2012  . GERD (gastroesophageal reflux disease) 10/22/2012  . Unspecified asthma(493.90) 10/22/2012   Outpatient Encounter Prescriptions as of 06/02/2014  Medication Sig  . albuterol (PROVENTIL HFA;VENTOLIN HFA) 108 (90 BASE) MCG/ACT inhaler Inhale 2 puffs into the lungs every 6 (six) hours as needed for wheezing or shortness of breath.  . albuterol (PROVENTIL) (2.5 MG/3ML) 0.083% nebulizer solution Take 3 mLs (2.5 mg total) by nebulization every 6 (six) hours as needed for wheezing or shortness of breath.  . aspirin 81 MG tablet Take 81 mg by mouth  daily.  . calcium carbonate (TUMS) 500 MG chewable tablet Chew 1 tablet (200 mg of elemental calcium total) by mouth as needed for indigestion or heartburn.  . Cholecalciferol (VITAMIN D) 2000 UNITS tablet Take 2,000 Units by mouth daily.  . fish oil-omega-3 fatty acids 1000 MG capsule Take 1 g by mouth daily.   . lisinopril-hydrochlorothiazide (PRINZIDE,ZESTORETIC) 20-12.5 MG per tablet Take 1 tablet by mouth daily.  . rosuvastatin (CRESTOR) 20 MG tablet Take 1 tablet (20 mg total) by mouth daily. (Patient taking differently: Take 20 mg by mouth daily. Takes 1 tablet on MWF)   No facility-administered encounter medications on file as of 06/02/2014.     Review of Systems  Constitutional: Negative.   HENT: Negative.   Eyes: Negative.   Respiratory: Negative.   Cardiovascular: Negative.   Gastrointestinal: Positive for nausea, vomiting and abdominal pain. Blood in stool: dark - coffee colored stool.  Endocrine: Negative.   Genitourinary: Negative.   Musculoskeletal: Negative.   Skin: Negative.   Allergic/Immunologic: Negative.   Neurological: Negative.   Hematological: Negative.   Psychiatric/Behavioral: Negative.        Objective:   Physical Exam  Constitutional: She is oriented to person, place, and time. She appears well-developed and well-nourished.  HENT:  Head: Normocephalic and atraumatic.  Nose: Nose normal.  Mouth/Throat: Oropharynx is clear and moist.  Eyes: Conjunctivae and EOM are normal. Pupils are equal, round, and reactive to light. Right eye exhibits no discharge. Left eye exhibits   no discharge. No scleral icterus.  Neck: Normal range of motion. Neck supple. No thyromegaly present.  Cardiovascular: Normal rate, regular rhythm and normal heart sounds.   No murmur heard. At 72/m  Pulmonary/Chest: Effort normal and breath sounds normal. No respiratory distress. She has no wheezes. She has no rales. She exhibits no tenderness.  Clear anteriorly and posteriorly    Abdominal: Soft. Bowel sounds are normal. She exhibits no mass. There is tenderness. There is no rebound and no guarding.  There is epigastric and left upper quadrant tenderness without masses or organ enlargement  Musculoskeletal: Normal range of motion. She exhibits no edema.  Lymphadenopathy:    She has no cervical adenopathy.  Neurological: She is alert and oriented to person, place, and time. No cranial nerve deficit.  Skin: Skin is warm and dry. No rash noted.  Psychiatric: She has a normal mood and affect. Her behavior is normal. Judgment and thought content normal.  Nursing note and vitals reviewed.  BP 133/81 mmHg  Pulse 75  Temp(Src) 97.6 F (36.4 C) (Oral)  Ht 5' 3" (1.6 m)  Wt 195 lb (88.451 kg)  BMI 34.55 kg/m2  The Hemoccult card done in the office today with a rectal exam was negative for blood [facility] reading (PRIMARY) by  Dr. Moore-chest x-ray--  no active disease                                     Assessment & Plan:  1. Abnormal stool color -Return FOBT - POCT CBC - BMP8+EGFR - Hepatic function panel - Amylase - Lipase - CT Abdomen Pelvis Wo Contrast; Future  2. Abdominal pain, unspecified abdominal location -Start Zantac 150 over the counter 1 twice daily before breakfast and supper - POCT CBC - BMP8+EGFR - Hepatic function panel - Amylase - Lipase - CT Abdomen Pelvis Wo Contrast; Future  3. Cough -Drink plenty of fluids and take plain Mucinex 1 tablet twice daily as needed for cough and congestion - DG Chest 2 View; Future  4. Nausea and vomiting, vomiting of unspecified type -Get CT scan of abdomen and get lab results back to further evaluate this -Take Zantac twice daily as directed  Patient Instructions  The patient should take Zantac 150 over-the-counter twice daily before breakfast and supper until we recheck her. She should avoid caffeine fried foods and milk and dairy products as much as possible She should return the FOBT We will get  a CT scan of her abdomen and a chest x-ray in the office today.   Don W. Moore MD   

## 2014-06-03 ENCOUNTER — Other Ambulatory Visit: Payer: 59

## 2014-06-03 ENCOUNTER — Telehealth: Payer: Self-pay | Admitting: *Deleted

## 2014-06-03 DIAGNOSIS — Z1212 Encounter for screening for malignant neoplasm of rectum: Secondary | ICD-10-CM

## 2014-06-03 LAB — HEPATIC FUNCTION PANEL
ALT: 23 IU/L (ref 0–32)
AST: 17 IU/L (ref 0–40)
Albumin: 4 g/dL (ref 3.6–4.8)
Alkaline Phosphatase: 73 IU/L (ref 39–117)
Bilirubin Total: 0.4 mg/dL (ref 0.0–1.2)
Bilirubin, Direct: 0.1 mg/dL (ref 0.00–0.40)
Total Protein: 6.5 g/dL (ref 6.0–8.5)

## 2014-06-03 LAB — BMP8+EGFR
BUN/Creatinine Ratio: 15 (ref 11–26)
BUN: 14 mg/dL (ref 8–27)
CO2: 28 mmol/L (ref 18–29)
Calcium: 9.3 mg/dL (ref 8.7–10.3)
Chloride: 100 mmol/L (ref 97–108)
Creatinine, Ser: 0.91 mg/dL (ref 0.57–1.00)
GFR calc Af Amer: 78 mL/min/{1.73_m2} (ref >59)
GFR calc non Af Amer: 68 mL/min/{1.73_m2} (ref >59)
Glucose: 90 mg/dL (ref 65–99)
Potassium: 4 mmol/L (ref 3.5–5.2)
Sodium: 141 mmol/L (ref 134–144)

## 2014-06-03 LAB — LIPASE: Lipase: 21 U/L (ref 0–59)

## 2014-06-03 LAB — AMYLASE: Amylase: 111 U/L (ref 31–124)

## 2014-06-03 NOTE — Telephone Encounter (Signed)
-----   Message from Chipper Herb, MD sent at 06/03/2014  1:54 PM EDT ----- The blood sugar is good at 90. The creatinine, the most important kidney function test is within normal limits. The electrolytes including potassium are all good. All liver function tests are within normal limits The amylase and important test for the pancreatic function is within normal limits The lipase is within normal limits There is no explanation with the blood work results that have been return for the abdominal pain and discomfort. We will call you as soon as we get the CT scan results back.

## 2014-06-03 NOTE — Telephone Encounter (Signed)
Pt notified of results Verbalizes understanding 

## 2014-06-03 NOTE — Progress Notes (Signed)
Lab only 

## 2014-06-06 LAB — FECAL OCCULT BLOOD, IMMUNOCHEMICAL: Fecal Occult Bld: NEGATIVE

## 2014-06-11 ENCOUNTER — Other Ambulatory Visit: Payer: Self-pay

## 2014-06-11 ENCOUNTER — Telehealth: Payer: Self-pay

## 2014-06-11 DIAGNOSIS — R1084 Generalized abdominal pain: Secondary | ICD-10-CM

## 2014-06-11 NOTE — Telephone Encounter (Signed)
Cancel appointment with me tomorrow and send patient to gastroenterologist and maybe he can get further studies done that they will not allow [place] to do

## 2014-06-11 NOTE — Telephone Encounter (Signed)
Patient still having pain and since CT was denied what should she do?  She said to cancel appt with you tommorow if you are going to send her to a specialist for this

## 2014-06-12 ENCOUNTER — Encounter: Payer: Self-pay | Admitting: Gastroenterology

## 2014-06-12 ENCOUNTER — Ambulatory Visit: Payer: 59 | Admitting: Family Medicine

## 2014-06-17 ENCOUNTER — Ambulatory Visit: Payer: 59 | Admitting: Family Medicine

## 2014-07-21 ENCOUNTER — Ambulatory Visit (INDEPENDENT_AMBULATORY_CARE_PROVIDER_SITE_OTHER): Payer: 59 | Admitting: Gastroenterology

## 2014-07-21 ENCOUNTER — Encounter: Payer: Self-pay | Admitting: Gastroenterology

## 2014-07-21 VITALS — BP 112/80 | HR 60 | Ht 63.0 in | Wt 201.0 lb

## 2014-07-21 DIAGNOSIS — G8929 Other chronic pain: Secondary | ICD-10-CM | POA: Diagnosis not present

## 2014-07-21 DIAGNOSIS — R1013 Epigastric pain: Secondary | ICD-10-CM | POA: Diagnosis not present

## 2014-07-21 NOTE — Patient Instructions (Addendum)
You will be set up for a CT scan of abdomen and pelvis with IV and oral contrast for epigastric pains.  You have been scheduled for a CT scan of the abdomen and pelvis at [facility] ([address]---this is in the same building as Press [facility]).   You are scheduled on 7//7/16 at 1030 am. You should arrive 15 minutes prior to your appointment time for registration. Please follow the written instructions below on the day of your exam:  WARNING: IF YOU ARE ALLERGIC TO IODINE/X-RAY DYE, PLEASE NOTIFY RADIOLOGY IMMEDIATELY AT [phone]! YOU WILL BE GIVEN A 13 HOUR PREMEDICATION PREP.  1) Do not eat or drink anything after 630 am (4 hours prior to your test) 2) You have been given 2 bottles of oral contrast to drink. The solution may taste better if refrigerated, but do NOT add ice or any other liquid to this solution. Shake well before drinking.    Drink 1 bottle of contrast @ 830  am (2 hours prior to your exam)  Drink 1 bottle of contrast @ 930 am (1 hour prior to your exam)  You may take any medications as prescribed with a small amount of water except for the following: Metformin, Glucophage, Glucovance, Avandamet, Riomet, Fortamet, Actoplus Met, Janumet, Glumetza or Metaglip. The above medications must be held the day of the exam AND 48 hours after the exam.  The purpose of you drinking the oral contrast is to aid in the visualization of your intestinal tract. The contrast solution may cause some diarrhea. Before your exam is started, you will be given a small amount of fluid to drink. Depending on your individual set of symptoms, you may also receive an intravenous injection of x-ray contrast/dye. Plan on being at [facility] for 30 minutes or long, depending on the type of exam you are having performed.  This test typically takes 30-45 minutes to complete.  If you have any questions regarding your exam or if you need to reschedule, you may call the CT department at  [phone] between the hours of 8:00 am and 5:00 pm, Monday-Friday.  ________________________________________________________________________  Ranitidine $RemoveBef'150mg'[id number]$  pills, take one pill twice daily (AM and also just before bedtime).

## 2014-07-21 NOTE — Progress Notes (Signed)
HPI: This is a  very pleasant 64 year old woman    who was referred to me by Chipper Herb, MD  to evaluate  epigastric pain .    Chief complaint is epigastric pain  Chronic abdominal pains.  Have been going on for many months (6-8 months).  She has intermittent pains.  Often at night she will awaken with epigastric pian that can last several hours.  She has been taking a lot of tums. Sometimes they help.  She has tried baking soda.  No pyrosis, no acid taste in her mouth.  No dysphagia.  Feels bloated.  Overall weight has been stable.  Colonoscopy 11 2014, Dr. Gala Romney, done for history of adenomas. Findings left-sided diverticulosis. 2 diminutive polyps, normal terminal ileum. The polyps were adenomatous on pathology. Upper endoscopy 11 2014, Dr. Gala Romney, done for abdominal pain, epigastric. This was normal except for previous evidence of fundoplication surgery. He increased her as needed H2 blocker.  Bloodwork May 2016 CBC, complete metabolic profile will both essentially normal.  Was taking omeprazole but stopped due to concern for side effects.  Was taking it PRN only.  Has not seemed to help her epigastric pains very much.  Takes H2 blocker only PRN.  CT scan 2014 done for abdominal pain was essentially normal.  Review of systems: Pertinent positive and negative review of systems were noted in the above HPI section. Complete review of systems was performed and was otherwise normal.   Past Medical History  Diagnosis Date  . Hyperlipidemia   . Hypertension   . GERD (gastroesophageal reflux disease)   . Depression   . Asthma   . Colon polyp     Past Surgical History  Procedure Laterality Date  . Abdominal hysterectomy    . Cholecystectomy    . Hernia repair      ventral X 2 with mesh, last one 2011 ([place]). complicated with 2 week hospital stay  . Carpel tunnel    . Tubal ligation    . Colonoscopy  02/08/2005    [id number] hemorrhoids otherwise normal rectum/ A  few scattered, shallow, left-sided sigmoid diverticula/The remainder of the colonic mucosa appeared normal  . Nissen fundoplication  8403  . Colonoscopy with esophagogastroduodenoscopy (egd) N/A 11/29/2012    Procedure: COLONOSCOPY WITH ESOPHAGOGASTRODUODENOSCOPY (EGD);  Surgeon: Daneil Dolin, MD;  Location: [facility];  Service: Endoscopy;  Laterality: N/A;  10:45    Current Outpatient Prescriptions  Medication Sig Dispense Refill  . albuterol (PROVENTIL HFA;VENTOLIN HFA) 108 (90 BASE) MCG/ACT inhaler Inhale 2 puffs into the lungs every 6 (six) hours as needed for wheezing or shortness of breath. 1 Inhaler 0  . albuterol (PROVENTIL) (2.5 MG/3ML) 0.083% nebulizer solution Take 3 mLs (2.5 mg total) by nebulization every 6 (six) hours as needed for wheezing or shortness of breath. 150 mL 1  . aspirin 81 MG tablet Take 81 mg by mouth daily.    . calcium carbonate (TUMS) 500 MG chewable tablet Chew 1 tablet (200 mg of elemental calcium total) by mouth as needed for indigestion or heartburn.    . Cholecalciferol (VITAMIN D) 2000 UNITS tablet Take 2,000 Units by mouth daily.    Marland Kitchen escitalopram (LEXAPRO) 10 MG tablet     . fish oil-omega-3 fatty acids 1000 MG capsule Take 1 g by mouth daily.     Marland Kitchen lisinopril-hydrochlorothiazide (PRINZIDE,ZESTORETIC) 20-12.5 MG per tablet Take 1 tablet by mouth daily. 90 tablet 3  . rosuvastatin (CRESTOR) 20 MG tablet Take 1 tablet (  20 mg total) by mouth daily. (Patient taking differently: Take 20 mg by mouth daily. Takes 1 tablet on MWF) 90 tablet 1   No current facility-administered medications for this visit.    Allergies as of 07/21/2014 - Review Complete 07/21/2014  Allergen Reaction Noted  . Codeine Nausea And Vomiting 04/18/2012  . Penicillins Other (See Comments) 10/22/2012    Family History  Problem Relation Age of Onset  . Heart disease Mother   . Alzheimer's disease Mother   . Colon cancer Neg Hx   . Other Father     deceased age 2 from some  sort of GI problems but no malignancy    History   Social History  . Marital Status: Divorced    Spouse Name: N/A  . Number of Children: 3  . Years of Education: N/A   Occupational History  . retired    Social History Main Topics  . Smoking status: Former Smoker -- 0.50 packs/day for 28 years    Types: Cigarettes    Quit date: 01/17/1996  . Smokeless tobacco: Never Used  . Alcohol Use: 0.0 oz/week    0 Standard drinks or equivalent per week     Comment: occasional use of wine   . Drug Use: No  . Sexual Activity: Not on file   Other Topics Concern  . Not on file   Social History Narrative     Physical Exam: BP 112/80 mmHg  Pulse 60  Ht 5\' 3"  (1.6 m)  Wt 201 lb (91.173 kg)  BMI 35.61 kg/m2 Constitutional: generally well-appearing Psychiatric: alert and oriented x3 Eyes: extraocular movements intact Mouth: oral pharynx moist, no lesions Neck: supple no lymphadenopathy Cardiovascular: heart regular rate and rhythm Lungs: clear to auscultation bilaterally Abdomen: soft, nontender, nondistended, no obvious ascites, no peritoneal signs, normal bowel sounds Extremities: no lower extremity edema bilaterally Skin: no lesions on visible extremities   Assessment and plan: 64 y.o. female with  intermittent epigastric pain, previous Nissen fundoplication  Her epigastric abdominal pain seems GERD like to me. Tones somewhat helps. Proton pump inhibitor has somewhat helped also. I would like to check for other potential causes with CT scan abdomen and pelvis. Advised her to restart H2 blocker 150 mg ranitidine twice daily including the p.m. dose to be at bedtime. She is reluctant to get back on proton pump inhibitors due to concern for potential side effects.   Owens Loffler, MD [facility] 07/21/2014, 10:47 AM  Cc: Chipper Herb, MD

## 2014-07-23 ENCOUNTER — Ambulatory Visit (INDEPENDENT_AMBULATORY_CARE_PROVIDER_SITE_OTHER)
Admission: RE | Admit: 2014-07-23 | Discharge: 2014-07-23 | Disposition: A | Discharge status: Home / Self Care | Payer: 59 | Source: Ambulatory Visit | Attending: Gastroenterology | Admitting: Gastroenterology

## 2014-07-23 DIAGNOSIS — R1013 Epigastric pain: Secondary | ICD-10-CM

## 2014-07-23 DIAGNOSIS — G8929 Other chronic pain: Secondary | ICD-10-CM

## 2014-07-23 MED ORDER — IOHEXOL 300 MG/ML  SOLN
100.0000 mL | Freq: Once | INTRAMUSCULAR | Status: AC | PRN
  Administered 2014-07-23: 100 mL via INTRAVENOUS

## 2014-10-22 ENCOUNTER — Ambulatory Visit (INDEPENDENT_AMBULATORY_CARE_PROVIDER_SITE_OTHER): Payer: 59 | Admitting: *Deleted

## 2014-10-22 DIAGNOSIS — Z23 Encounter for immunization: Secondary | ICD-10-CM

## 2015-02-16 ENCOUNTER — Encounter: Payer: Self-pay | Admitting: Family Medicine

## 2015-02-16 ENCOUNTER — Ambulatory Visit (INDEPENDENT_AMBULATORY_CARE_PROVIDER_SITE_OTHER): Payer: BLUE CROSS/BLUE SHIELD | Admitting: Family Medicine

## 2015-02-16 VITALS — BP 109/72 | HR 82 | Temp 97.8°F | Ht 63.0 in | Wt 199.0 lb

## 2015-02-16 DIAGNOSIS — Z0000 Encounter for general adult medical examination without abnormal findings: Secondary | ICD-10-CM | POA: Diagnosis not present

## 2015-02-16 DIAGNOSIS — M2550 Pain in unspecified joint: Secondary | ICD-10-CM

## 2015-02-16 DIAGNOSIS — I10 Essential (primary) hypertension: Secondary | ICD-10-CM

## 2015-02-16 DIAGNOSIS — E785 Hyperlipidemia, unspecified: Secondary | ICD-10-CM

## 2015-02-16 DIAGNOSIS — E559 Vitamin D deficiency, unspecified: Secondary | ICD-10-CM

## 2015-02-16 DIAGNOSIS — K219 Gastro-esophageal reflux disease without esophagitis: Secondary | ICD-10-CM

## 2015-02-16 NOTE — Patient Instructions (Addendum)
Continue current medications. Continue good therapeutic lifestyle changes which include good diet and exercise. Fall precautions discussed with patient. If an FOBT was given today- please return it to our front desk. If you are over 64 years old - you may need Prevnar 60 or the adult Pneumonia vaccine.  **Flu shots are available--- please call and schedule a FLU-CLINIC appointment**  After your visit with [place] today you will receive a survey in the mail or online from [employer] regarding your care with [place]. Please take a moment to fill this out. Your feedback is very important to [place] as you can help [place] better understand your patient needs as well as improve your experience and satisfaction. WE CARE ABOUT YOU!!!   Take Tylenol if needed for hand painand joint pain and avoid NSAIDs because of GERD Get mammogram and pelvic exam Return to clinic for fasting lab work The sure and work on BMI with diet and exercise and to get this down below 30. Don't forget to get your eye exam yearly. Check with your insurance about Prevnar vaccine Use nasal saline frequently during the day for nasal congestion

## 2015-02-16 NOTE — Progress Notes (Signed)
Subjective:    Patient ID: Leah [name], female    DOB: Aug 10, 1950, 65 y.o.   MRN: [id number]  HPI Patient is here today for annual wellness exam and follow up of chronic medical problems which includes hypertension and hyperlipidemia. She is taking medications regularly. The patient comes in today complaining of back pain and arthralgias in the left shoulder and arm and bilateral hand arthralgias. She has a history of GERD hypertension and hyperlipidemia. She also has a history of asthma and uses an albuterol inhaler for this. She takes vitamin D 2000 units daily. In May she had a chest x-ray that was normal and later in the summer has CT of the abdomen and pelvis by the gastroenterologist and everything was normal with this. She had lumbar spine films and October 2014 and this showed some degenerative changes. She is due to get her pelvic exam and this will be scheduled with the mid-level provider here. She is also due to get her mammogram. Her lab work is dealing she will return to the clinic fasting for this. She will check with her insurance regarding the Prevnar vaccine. The patient denies any shortness of breath and is doing well with her asthma. She is also doing well with her GERD and denies any heartburn or indigestion or blood in the stool or black tarry bowel movements. She is passing water without problems. She does have some chest pain not related to exercise or activity in the left chest wall and left shoulder. She also complains of arthralgias in her hands. When she returns to get her blood work will get an arthritis profile.      Patient Active Problem List   Diagnosis Date Noted  . Adjustment disorder with mixed anxiety and depressed mood 03/25/2013  . Abdominal pain, chronic, epigastric 11/12/2012  . Nausea alone 11/12/2012  . Personal history of colonic polyps 11/12/2012  . Hypertension, benign essential, goal below 140/90 10/30/2012  . Hyperlipidemia 10/22/2012  . GERD  (gastroesophageal reflux disease) 10/22/2012  . Unspecified asthma(493.90) 10/22/2012   Outpatient Encounter Prescriptions as of 02/16/2015  Medication Sig  . albuterol (PROVENTIL HFA;VENTOLIN HFA) 108 (90 BASE) MCG/ACT inhaler Inhale 2 puffs into the lungs every 6 (six) hours as needed for wheezing or shortness of breath.  Marland Kitchen albuterol (PROVENTIL) (2.5 MG/3ML) 0.083% nebulizer solution Take 3 mLs (2.5 mg total) by nebulization every 6 (six) hours as needed for wheezing or shortness of breath.  Marland Kitchen aspirin 81 MG tablet Take 81 mg by mouth daily.  . calcium carbonate (TUMS) 500 MG chewable tablet Chew 1 tablet (200 mg of elemental calcium total) by mouth as needed for indigestion or heartburn.  . Cholecalciferol (VITAMIN D) 2000 UNITS tablet Take 2,000 Units by mouth daily.  . fish oil-omega-3 fatty acids 1000 MG capsule Take 1 g by mouth daily.   Marland Kitchen lisinopril-hydrochlorothiazide (PRINZIDE,ZESTORETIC) 20-12.5 MG per tablet Take 1 tablet by mouth daily.  . rosuvastatin (CRESTOR) 20 MG tablet Take 1 tablet (20 mg total) by mouth daily. (Patient taking differently: Take 20 mg by mouth daily. Takes 1 tablet on MWF)  . [DISCONTINUED] escitalopram (LEXAPRO) 10 MG tablet    No facility-administered encounter medications on file as of 02/16/2015.      Review of Systems  Constitutional: Negative.   HENT: Negative.   Eyes: Negative.   Respiratory: Negative.   Cardiovascular: Negative.   Gastrointestinal: Negative.   Endocrine: Negative.   Genitourinary: Negative.   Musculoskeletal: Positive for back pain and arthralgias (  left shoulder / arm pain and bilateral hands ache).  Skin: Negative.   Allergic/Immunologic: Negative.   Neurological: Negative.   Hematological: Negative.   Psychiatric/Behavioral: Negative.        Objective:   Physical Exam  Constitutional: She is oriented to person, place, and time. She appears well-developed and well-nourished. No distress.  HENT:  Head: Normocephalic  and atraumatic.  Right Ear: External ear normal.  Left Ear: External ear normal.  Mouth/Throat: Oropharynx is clear and moist.  The patient has nasal congestion and turbinate swelling bilaterally  Eyes: Conjunctivae and EOM are normal. Pupils are equal, round, and reactive to light. Right eye exhibits no discharge. Left eye exhibits no discharge. No scleral icterus.  Neck: Normal range of motion. Neck supple. No thyromegaly present.  Cardiovascular: Normal rate, regular rhythm, normal heart sounds and intact distal pulses.   No murmur heard. The heart has a regular rate and rhythm at 84/m  Pulmonary/Chest: Effort normal and breath sounds normal. No respiratory distress. She has no wheezes. She has no rales. She exhibits no tenderness.  Clear anteriorly and posteriorly  Abdominal: Soft. Bowel sounds are normal. She exhibits no mass. There is no tenderness. There is no rebound and no guarding.  There is minimal epigastric tenderness no liver or spleen enlargement and no inguinal adenopathy.  Musculoskeletal: Normal range of motion. She exhibits no edema or tenderness.  Lymphadenopathy:    She has no cervical adenopathy.  Neurological: She is alert and oriented to person, place, and time. She has normal reflexes. No cranial nerve deficit.  Skin: Skin is warm and dry. No rash noted.  Psychiatric: She has a normal mood and affect. Her behavior is normal. Judgment and thought content normal.  Nursing note and vitals reviewed.  BP 109/72 mmHg  Pulse 82  Temp(Src) 97.8 F (36.6 C) (Oral)  Ht '5\' 3"'$  (1.6 m)  Wt 199 lb (90.266 kg)  BMI 35.26 kg/m2  EKG:  Normal EKG with normal sinus rhythm       Assessment & Plan:  1. Essential hypertension -The blood pressure is good today and the patient will continue with her lisinopril. - BMP8+EGFR; Future - CBC with Differential/Platelet; Future - Hepatic function panel; Future  2. Vitamin D deficiency -Continue with vitamin D replacement pending  results of lab work - CBC with Differential/Platelet; Future - VITAMIN D 25 Hydroxy (Vit-D Deficiency, Fractures); Future  3. Annual physical exam -The patient is due to get her mammogram and pelvic exam she agrees to get this. - CBC with Differential/Platelet; Future  4. Hyperlipidemia -She had stopped her Crestor due to muscle aches and myalgias and is only taking garlic now - CBC with Differential/Platelet; Future - NMR, lipoprofile; Future  5. Hypertension, benign essential, goal below 140/90 -The blood pressure is at goal today and she will continue current treatment  6. Arthralgia -Take Tylenol for aches and pains - Arthritis Panel; Future - EKG 12-Lead  7. Gastroesophageal reflux disease, esophagitis presence not specified -Continue watching and avoiding NSAIDs and watching fried foods etc.  Patient Instructions  Continue current medications. Continue good therapeutic lifestyle changes which include good diet and exercise. Fall precautions discussed with patient. If an FOBT was given today- please return it to our front desk. If you are over 73 years old - you may need Prevnar 62 or the adult Pneumonia vaccine.  **Flu shots are available--- please call and schedule a FLU-CLINIC appointment**  After your visit with [place] today you will receive a survey  in the mail or online from [employer] regarding your care with [place]. Please take a moment to fill this out. Your feedback is very important to [place] as you can help [place] better understand your patient needs as well as improve your experience and satisfaction. WE CARE ABOUT YOU!!!   Take Tylenol if needed for hand painand joint pain and avoid NSAIDs because of GERD Get mammogram and pelvic exam Return to clinic for fasting lab work The sure and work on BMI with diet and exercise and to get this down below 30. Don't forget to get your eye exam yearly. Check with your insurance about Prevnar vaccine Use nasal saline frequently during  the day for nasal congestion   Arrie Senate MD

## 2015-02-17 ENCOUNTER — Other Ambulatory Visit (INDEPENDENT_AMBULATORY_CARE_PROVIDER_SITE_OTHER): Payer: BLUE CROSS/BLUE SHIELD

## 2015-02-17 DIAGNOSIS — I10 Essential (primary) hypertension: Secondary | ICD-10-CM

## 2015-02-17 DIAGNOSIS — M2550 Pain in unspecified joint: Secondary | ICD-10-CM

## 2015-02-17 DIAGNOSIS — Z0000 Encounter for general adult medical examination without abnormal findings: Secondary | ICD-10-CM

## 2015-02-17 DIAGNOSIS — E785 Hyperlipidemia, unspecified: Secondary | ICD-10-CM

## 2015-02-17 DIAGNOSIS — E559 Vitamin D deficiency, unspecified: Secondary | ICD-10-CM

## 2015-02-17 NOTE — Progress Notes (Signed)
Lab only 

## 2015-02-18 ENCOUNTER — Telehealth: Payer: Self-pay | Admitting: Family Medicine

## 2015-02-18 DIAGNOSIS — E785 Hyperlipidemia, unspecified: Secondary | ICD-10-CM

## 2015-02-18 LAB — NMR, LIPOPROFILE
Cholesterol: 313 mg/dL — ABNORMAL HIGH (ref 100–199)
HDL Cholesterol by NMR: 56 mg/dL (ref >39)
HDL Particle Number: 34.4 umol/L (ref >=30.5)
LDL Particle Number: 3177 nmol/L — ABNORMAL HIGH (ref <1000)
LDL Size: 20.5 nm (ref >20.5)
LDL-C: 237 mg/dL — ABNORMAL HIGH (ref 0–99)
LP-IR Score: 72 — ABNORMAL HIGH (ref <=45)
Small LDL Particle Number: 1695 nmol/L — ABNORMAL HIGH (ref <=527)
Triglycerides by NMR: 98 mg/dL (ref 0–149)

## 2015-02-18 LAB — ARTHRITIS PANEL
Basophils Absolute: 0 10*3/uL (ref 0.0–0.2)
Basos: 1 %
EOS (ABSOLUTE): 0.1 10*3/uL (ref 0.0–0.4)
Eos: 2 %
Hematocrit: 40 % (ref 34.0–46.6)
Hemoglobin: 13.1 g/dL (ref 11.1–15.9)
Immature Grans (Abs): 0 10*3/uL (ref 0.0–0.1)
Immature Granulocytes: 0 %
Lymphocytes Absolute: 1.2 10*3/uL (ref 0.7–3.1)
Lymphs: 45 %
MCH: 26.5 pg — ABNORMAL LOW (ref 26.6–33.0)
MCHC: 32.8 g/dL (ref 31.5–35.7)
MCV: 81 fL (ref 79–97)
Monocytes Absolute: 0.3 10*3/uL (ref 0.1–0.9)
Monocytes: 9 %
Neutrophils Absolute: 1.2 10*3/uL — ABNORMAL LOW (ref 1.4–7.0)
Neutrophils: 43 %
Platelets: 263 10*3/uL (ref 150–379)
RBC: 4.94 x10E6/uL (ref 3.77–5.28)
RDW: 14.7 % (ref 12.3–15.4)
Rhuematoid fact SerPl-aCnc: <10 IU/mL (ref 0.0–13.9)
Sed Rate: 15 mm/hr (ref 0–40)
Uric Acid: 6.2 mg/dL (ref 2.5–7.1)
WBC: 2.8 10*3/uL — ABNORMAL LOW (ref 3.4–10.8)

## 2015-02-18 LAB — HEPATIC FUNCTION PANEL
ALT: 18 IU/L (ref 0–32)
AST: 16 IU/L (ref 0–40)
Albumin: 4 g/dL (ref 3.6–4.8)
Alkaline Phosphatase: 85 IU/L (ref 39–117)
Bilirubin Total: 0.3 mg/dL (ref 0.0–1.2)
Bilirubin, Direct: 0.06 mg/dL (ref 0.00–0.40)
Total Protein: 6.6 g/dL (ref 6.0–8.5)

## 2015-02-18 LAB — BMP8+EGFR
BUN/Creatinine Ratio: 20 (ref 11–26)
BUN: 19 mg/dL (ref 8–27)
CO2: 26 mmol/L (ref 18–29)
Calcium: 9.5 mg/dL (ref 8.7–10.3)
Chloride: 101 mmol/L (ref 96–106)
Creatinine, Ser: 0.93 mg/dL (ref 0.57–1.00)
GFR calc Af Amer: 76 mL/min/{1.73_m2} (ref >59)
GFR calc non Af Amer: 66 mL/min/{1.73_m2} (ref >59)
Glucose: 99 mg/dL (ref 65–99)
Potassium: 4.4 mmol/L (ref 3.5–5.2)
Sodium: 142 mmol/L (ref 134–144)

## 2015-02-18 LAB — VITAMIN D 25 HYDROXY (VIT D DEFICIENCY, FRACTURES): Vit D, 25-Hydroxy: 39.5 ng/mL (ref 30.0–100.0)

## 2015-02-18 MED ORDER — ATORVASTATIN CALCIUM 40 MG PO TABS: 40.0000 mg | ORAL_TABLET | Freq: Every day | ORAL | Status: DC

## 2015-02-18 NOTE — Telephone Encounter (Signed)
Stp and reviewed all labs with pt.

## 2015-02-22 ENCOUNTER — Encounter: Payer: Self-pay | Admitting: Pediatrics

## 2015-02-22 ENCOUNTER — Ambulatory Visit (INDEPENDENT_AMBULATORY_CARE_PROVIDER_SITE_OTHER): Payer: BLUE CROSS/BLUE SHIELD | Admitting: Pediatrics

## 2015-02-22 VITALS — BP 125/86 | HR 85 | Temp 97.1°F | Ht 63.0 in | Wt 200.4 lb

## 2015-02-22 DIAGNOSIS — Z01419 Encounter for gynecological examination (general) (routine) without abnormal findings: Secondary | ICD-10-CM

## 2015-02-22 DIAGNOSIS — Z124 Encounter for screening for malignant neoplasm of cervix: Secondary | ICD-10-CM

## 2015-02-22 DIAGNOSIS — Z1239 Encounter for other screening for malignant neoplasm of breast: Secondary | ICD-10-CM | POA: Diagnosis not present

## 2015-02-22 NOTE — Progress Notes (Signed)
    Subjective:    Patient ID: Leah [name], female    DOB: 25-May-1950, 65 y.o.   MRN: [id number]  CC: No chief complaint on file.   HPI: Leah [name] is a 65 y.o. female presenting for No chief complaint on file.  Pap smear: no h/o abnormal pap smears  Divorced several years ago, not regularly sexually active now Minimal vaginal discharge at times, has not noticed any itching.  No vaginal irritation. Overdue for mammogram. Has had abnormal in past requiring ultrasound biopsy, no further treatment per pt.   Depression screen [facility] 2/9 02/22/2015 02/16/2015 04/22/2014 02/12/2014 01/23/2014  Decreased Interest 0 0 0 0 0  Down, Depressed, Hopeless 0 0 0 0 0  PHQ - 2 Score 0 0 0 0 0     Relevant past medical, surgical, family and social history reviewed and updated as indicated. Interim medical history since our last visit reviewed. Allergies and medications reviewed and updated.    ROS: Per HPI unless specifically indicated above  History  Smoking status  . Former Smoker -- 0.50 packs/day for 28 years  . Types: Cigarettes  . Quit date: 01/17/1996  Smokeless tobacco  . Never Used    Past Medical History Patient Active Problem List   Diagnosis Date Noted  . Adjustment disorder with mixed anxiety and depressed mood 03/25/2013  . Abdominal pain, chronic, epigastric 11/12/2012  . Nausea alone 11/12/2012  . Personal history of colonic polyps 11/12/2012  . Hypertension, benign essential, goal below 140/90 10/30/2012  . Hyperlipidemia 10/22/2012  . GERD (gastroesophageal reflux disease) 10/22/2012  . Unspecified asthma(493.90) 10/22/2012        Objective:    BP 125/86 mmHg  Pulse 85  Temp(Src) 97.1 F (36.2 C) (Oral)  Ht 5\' 3"  (1.6 m)  Wt 200 lb 6.4 oz (90.901 kg)  BMI 35.51 kg/m2  Wt Readings from Last 3 Encounters:  02/22/15 200 lb 6.4 oz (90.901 kg)  02/16/15 199 lb (90.266 kg)  07/21/14 201 lb (91.173 kg)     Gen: NAD, alert, cooperative with exam,  NCAT EYES: EOMI, no scleral injection or icterus ENT:  TMs pearly gray b/l, OP without erythema LYMPH: no cervical LAD CV: NRRR, normal S1/S2, no murmur, distal pulses 2+ b/l Resp: CTABL, no wheezes, normal WOB Abd: +BS, soft, NTND. no guarding or organomegaly Breast: nl exam b/l GU: normal external exam. No cervix. Normal vaginal ruggae, minimal amount clear-white vaginal fluid. Ext: No edema, warm Neuro: Alert and oriented MSK: normal muscle bulk     Assessment & Plan:   64oF here for pelvic and breast exam, screening. Pt s/p hysterectomy, no cervix. If neg co-testing can stop pap smear screening.   Diagnoses and all orders for this visit:  Encounter for cervical Pap smear with pelvic exam -     Pap IG and HPV (high risk) DNA detection  Screening breast examination Normal. Will call to schedule mammogram.    Follow up plan: Return if symptoms worsen or fail to improve.  Assunta Found, MD [facility] 02/22/2015, 5:16 PM

## 2015-03-03 ENCOUNTER — Other Ambulatory Visit: Payer: Self-pay

## 2015-03-03 LAB — PAP IG AND HPV HIGH-RISK
HPV, high-risk: NEGATIVE
PAP Smear Comment: 0

## 2015-03-03 MED ORDER — LISINOPRIL-HYDROCHLOROTHIAZIDE 20-12.5 MG PO TABS: 1.0000 | ORAL_TABLET | Freq: Every day | ORAL | Status: DC

## 2015-04-02 ENCOUNTER — Other Ambulatory Visit: Payer: Self-pay | Admitting: Family Medicine

## 2015-04-02 DIAGNOSIS — R921 Mammographic calcification found on diagnostic imaging of breast: Secondary | ICD-10-CM

## 2015-04-08 ENCOUNTER — Ambulatory Visit
Admission: RE | Admit: 2015-04-08 | Discharge: 2015-04-08 | Disposition: A | Discharge status: Home / Self Care | Payer: BLUE CROSS/BLUE SHIELD | Source: Ambulatory Visit | Attending: Family Medicine | Admitting: Family Medicine

## 2015-04-08 DIAGNOSIS — R921 Mammographic calcification found on diagnostic imaging of breast: Secondary | ICD-10-CM

## 2015-05-15 ENCOUNTER — Telehealth: Payer: Self-pay | Admitting: Family Medicine

## 2015-05-15 NOTE — Telephone Encounter (Signed)
No know tetanus vaccination has been documented.  Informed patient we could see her today 05/15/15.  Patient states she will keep an eye on hand then call us back on Monday 05/17/15

## 2015-05-20 ENCOUNTER — Other Ambulatory Visit: Payer: Self-pay | Admitting: Family Medicine

## 2015-05-24 ENCOUNTER — Encounter: Payer: Self-pay | Admitting: *Deleted

## 2015-06-01 ENCOUNTER — Encounter: Payer: Self-pay | Admitting: Family Medicine

## 2015-06-01 ENCOUNTER — Ambulatory Visit (INDEPENDENT_AMBULATORY_CARE_PROVIDER_SITE_OTHER): Payer: BLUE CROSS/BLUE SHIELD | Admitting: Family Medicine

## 2015-06-01 VITALS — BP 127/85 | HR 75 | Temp 98.0°F | Ht 63.0 in | Wt 195.2 lb

## 2015-06-01 DIAGNOSIS — H9201 Otalgia, right ear: Secondary | ICD-10-CM | POA: Diagnosis not present

## 2015-06-01 MED ORDER — MELOXICAM 7.5 MG PO TABS: 7.5000 mg | ORAL_TABLET | Freq: Every day | ORAL | Status: DC

## 2015-06-01 MED ORDER — CEFDINIR 300 MG PO CAPS: 300.0000 mg | ORAL_CAPSULE | Freq: Two times a day (BID) | ORAL | Status: DC

## 2015-06-01 NOTE — Progress Notes (Signed)
   Subjective:    Patient ID: Leah [name], female    DOB: Jul 19, 1950, 65 y.o.   MRN: [id number]  HPI 65 year old female with complaints referrable to her right ear. The external ear feels warm and tender there is no hearing loss there is some sense of fullness in the ear. She's had no recent dental work no pain with opening and closing her mouth there is some pain in the temporal area. She has had no visual symptoms.  Patient Active Problem List   Diagnosis Date Noted  . Adjustment disorder with mixed anxiety and depressed mood 03/25/2013  . Abdominal pain, chronic, epigastric 11/12/2012  . Nausea alone 11/12/2012  . Personal history of colonic polyps 11/12/2012  . Hypertension, benign essential, goal below 140/90 10/30/2012  . Hyperlipidemia 10/22/2012  . GERD (gastroesophageal reflux disease) 10/22/2012  . Unspecified asthma(493.90) 10/22/2012   Outpatient Encounter Prescriptions as of 06/01/2015  Medication Sig  . albuterol (PROVENTIL HFA;VENTOLIN HFA) 108 (90 BASE) MCG/ACT inhaler Inhale 2 puffs into the lungs every 6 (six) hours as needed for wheezing or shortness of breath.  Marland Kitchen albuterol (PROVENTIL) (2.5 MG/3ML) 0.083% nebulizer solution Take 3 mLs (2.5 mg total) by nebulization every 6 (six) hours as needed for wheezing or shortness of breath.  Marland Kitchen aspirin 81 MG tablet Take 81 mg by mouth daily.  Marland Kitchen atorvastatin (LIPITOR) 40 MG tablet Take 1 tablet (40 mg total) by mouth daily.  . calcium carbonate (TUMS) 500 MG chewable tablet Chew 1 tablet (200 mg of elemental calcium total) by mouth as needed for indigestion or heartburn.  . Cholecalciferol (VITAMIN D) 2000 UNITS tablet Take 2,000 Units by mouth daily.  . fish oil-omega-3 fatty acids 1000 MG capsule Take 1 g by mouth daily.   Marland Kitchen lisinopril-hydrochlorothiazide (PRINZIDE,ZESTORETIC) 20-12.5 MG tablet TAKE 1 TABLET DAILY   No facility-administered encounter medications on file as of 06/01/2015.      Review of Systems    Constitutional: Negative.   HENT: Positive for ear pain.   Respiratory: Negative.   Cardiovascular: Negative.        Objective:   Physical Exam  Constitutional: She appears well-developed and well-nourished.  HENT:  Right ear there is some inflammation in the pinna and it is warm to touch. Tympanic membrane is dull without normal light reflex but without erythema. Hearing is grossly normal          Assessment & Plan:  1. Ear pain, right There appears to be some cellulitis in the pinna. There is effusion in the middle ear. Will treat with combination antibiotic and anti-inflammatory namely cefdinir 300 mg twice a day and meloxicam 7.5 mg daily. If symptoms are not resolved in 7-10 days refer to ENT. I should say diagnosis and symptoms are not totally consistent with any common entity.  Wardell Honour MD

## 2015-07-03 ENCOUNTER — Ambulatory Visit (INDEPENDENT_AMBULATORY_CARE_PROVIDER_SITE_OTHER): Payer: BLUE CROSS/BLUE SHIELD | Admitting: Family Medicine

## 2015-07-03 ENCOUNTER — Encounter: Payer: Self-pay | Admitting: Family Medicine

## 2015-07-03 VITALS — BP 127/79 | HR 71 | Temp 98.2°F | Ht 63.0 in | Wt 188.0 lb

## 2015-07-03 DIAGNOSIS — J4521 Mild intermittent asthma with (acute) exacerbation: Secondary | ICD-10-CM

## 2015-07-03 MED ORDER — FLUTICASONE PROPIONATE 50 MCG/ACT NA SUSP: 1.0000 | Freq: Two times a day (BID) | NASAL | Status: DC | PRN

## 2015-07-03 MED ORDER — METHYLPREDNISOLONE ACETATE 80 MG/ML IJ SUSP
80.0000 mg | Freq: Once | INTRAMUSCULAR | Status: AC
  Administered 2015-07-03: 80 mg via INTRAMUSCULAR

## 2015-07-03 NOTE — Progress Notes (Signed)
BP 127/79 mmHg  Pulse 71  Temp(Src) 98.2 F (36.8 C) (Oral)  Ht 5\' 3"  (1.6 m)  Wt 188 lb (85.276 kg)  BMI 33.31 kg/m2   Subjective:    Patient ID: Leah [name], female    DOB: 1950/04/16, 65 y.o.   MRN: [id number]  HPI: Leah [name] is a 65 y.o. female presenting on 07/03/2015 for Nasal Congestion; Ear Pain; and Cough   HPI Cough and shortness of breath and wheezing and sinus congestion and ear pressure Patient is been having cough and sinus congestion and ear pressure that been building up over the past 5 days. She has been having some shortness of breath and chest tightness and wheezing. She feels like her asthma is acting up. She has been using her albuterol inhaler and Mucinex and they help but it is not lasting and she feels like she is continuing again worse. She denies any fevers or chills. Her ears are popping and she is getting a lot of drainage and she is coughing up blood-streaked yellow green sputum.  Relevant past medical, surgical, family and social history reviewed and updated as indicated. Interim medical history since our last visit reviewed. Allergies and medications reviewed and updated.  Review of Systems  Constitutional: Negative for fever and chills.  HENT: Positive for congestion, postnasal drip, rhinorrhea, sinus pressure, sneezing and sore throat. Negative for ear discharge and ear pain.   Eyes: Negative for pain, redness and visual disturbance.  Respiratory: Positive for cough, chest tightness, shortness of breath and wheezing.   Cardiovascular: Negative for chest pain and leg swelling.  Genitourinary: Negative for dysuria and difficulty urinating.  Musculoskeletal: Negative for back pain and gait problem.  Skin: Negative for rash.  Neurological: Negative for light-headedness and headaches.  Psychiatric/Behavioral: Negative for behavioral problems and agitation.  All other systems reviewed and are negative.   Per HPI unless specifically  indicated above     Medication List       This list is accurate as of: 07/03/15  8:58 AM.  Always use your most recent med list.               albuterol 108 (90 Base) MCG/ACT inhaler  Commonly known as:  PROVENTIL HFA;VENTOLIN HFA  Inhale 2 puffs into the lungs every 6 (six) hours as needed for wheezing or shortness of breath.     albuterol (2.5 MG/3ML) 0.083% nebulizer solution  Commonly known as:  PROVENTIL  Take 3 mLs (2.5 mg total) by nebulization every 6 (six) hours as needed for wheezing or shortness of breath.     aspirin 81 MG tablet  Take 81 mg by mouth daily.     atorvastatin 40 MG tablet  Commonly known as:  LIPITOR  Take 1 tablet (40 mg total) by mouth daily.     calcium carbonate 500 MG chewable tablet  Commonly known as:  TUMS  Chew 1 tablet (200 mg of elemental calcium total) by mouth as needed for indigestion or heartburn.     fish oil-omega-3 fatty acids 1000 MG capsule  Take 1 g by mouth daily.     fluticasone 50 MCG/ACT nasal spray  Commonly known as:  FLONASE  Place 1 spray into both nostrils 2 (two) times daily as needed for allergies or rhinitis.     lisinopril-hydrochlorothiazide 20-12.5 MG tablet  Commonly known as:  PRINZIDE,ZESTORETIC  TAKE 1 TABLET DAILY     Vitamin D 2000 units tablet  Take 2,000 Units by mouth  daily.           Objective:    BP 127/79 mmHg  Pulse 71  Temp(Src) 98.2 F (36.8 C) (Oral)  Ht 5\' 3"  (1.6 m)  Wt 188 lb (85.276 kg)  BMI 33.31 kg/m2  Wt Readings from Last 3 Encounters:  07/03/15 188 lb (85.276 kg)  06/01/15 195 lb 3.2 oz (88.542 kg)  02/22/15 200 lb 6.4 oz (90.901 kg)    Physical Exam  Constitutional: She is oriented to person, place, and time. She appears well-developed and well-nourished. No distress.  HENT:  Right Ear: Tympanic membrane, external ear and ear canal normal.  Left Ear: Tympanic membrane, external ear and ear canal normal.  Nose: Mucosal edema and rhinorrhea present. No epistaxis.  Right sinus exhibits no maxillary sinus tenderness and no frontal sinus tenderness. Left sinus exhibits no maxillary sinus tenderness and no frontal sinus tenderness.  Mouth/Throat: Uvula is midline and mucous membranes are normal. Posterior oropharyngeal edema and posterior oropharyngeal erythema present. No oropharyngeal exudate or tonsillar abscesses.  Eyes: Conjunctivae and EOM are normal.  Neck: Neck supple. No thyromegaly present.  Cardiovascular: Normal rate, regular rhythm, normal heart sounds and intact distal pulses.   No murmur heard. Pulmonary/Chest: Effort normal and breath sounds normal. No respiratory distress. She has no wheezes (No wheezes on exam today but patient just had used albuterol an hour before). She has no rales. She exhibits no tenderness.  Musculoskeletal: Normal range of motion. She exhibits no edema or tenderness.  Lymphadenopathy:    She has no cervical adenopathy.  Neurological: She is alert and oriented to person, place, and time. Coordination normal.  Skin: Skin is warm and dry. No rash noted. She is not diaphoretic.  Psychiatric: She has a normal mood and affect. Her behavior is normal.  Nursing note and vitals reviewed.     Assessment & Plan:   Problem List Items Addressed This Visit    None    Visit Diagnoses    Asthma with acute exacerbation, mild intermittent    -  Primary    Relevant Medications    methylPREDNISolone acetate (DEPO-MEDROL) injection 80 mg (Completed)    fluticasone (FLONASE) 50 MCG/ACT nasal spray        Follow up plan: Return if symptoms worsen or fail to improve.  Counseling provided for all of the vaccine components No orders of the defined types were placed in this encounter.    Caryl Pina, MD [facility] 07/03/2015, 8:58 AM

## 2015-07-08 ENCOUNTER — Telehealth: Payer: Self-pay | Admitting: Family Medicine

## 2015-07-08 ENCOUNTER — Other Ambulatory Visit: Payer: Self-pay

## 2015-07-08 MED ORDER — ALBUTEROL SULFATE (2.5 MG/3ML) 0.083% IN NEBU: 2.5000 mg | INHALATION_SOLUTION | Freq: Four times a day (QID) | RESPIRATORY_TRACT | Status: DC | PRN

## 2015-07-08 NOTE — Telephone Encounter (Signed)
done

## 2015-08-10 ENCOUNTER — Ambulatory Visit (INDEPENDENT_AMBULATORY_CARE_PROVIDER_SITE_OTHER): Payer: BLUE CROSS/BLUE SHIELD | Admitting: Family Medicine

## 2015-08-10 ENCOUNTER — Encounter: Payer: Self-pay | Admitting: Family Medicine

## 2015-08-10 ENCOUNTER — Other Ambulatory Visit: Payer: Self-pay | Admitting: Family Medicine

## 2015-08-10 VITALS — BP 100/68 | HR 83 | Temp 97.9°F | Ht 63.0 in | Wt 186.0 lb

## 2015-08-10 DIAGNOSIS — I10 Essential (primary) hypertension: Secondary | ICD-10-CM

## 2015-08-10 DIAGNOSIS — M2550 Pain in unspecified joint: Secondary | ICD-10-CM

## 2015-08-10 DIAGNOSIS — K219 Gastro-esophageal reflux disease without esophagitis: Secondary | ICD-10-CM | POA: Diagnosis not present

## 2015-08-10 DIAGNOSIS — E785 Hyperlipidemia, unspecified: Secondary | ICD-10-CM | POA: Diagnosis not present

## 2015-08-10 DIAGNOSIS — F418 Other specified anxiety disorders: Secondary | ICD-10-CM

## 2015-08-10 DIAGNOSIS — E559 Vitamin D deficiency, unspecified: Secondary | ICD-10-CM | POA: Diagnosis not present

## 2015-08-10 NOTE — Patient Instructions (Addendum)
Continue current medications. Continue good therapeutic lifestyle changes which include good diet and exercise. Fall precautions discussed with patient. If an FOBT was given today- please return it to our front desk. If you are over 64 years old - you may need Prevnar 32 or the adult Pneumonia vaccine.   After your visit with [place] today you will receive a survey in the mail or online from [employer] regarding your care with [place]. Please take a moment to fill this out. Your feedback is very important to [place] as you can help [place] better understand your patient needs as well as improve your experience and satisfaction. WE CARE ABOUT YOU!!!   Try to walk and exercise more and avoid any heavy lifting pushing and pulling Take Tylenol as needed for pain and if necessary use some warm wet compresses on your back The CT scan from a year ago indicated you had some wear and tear degenerative changes in the low back. We will call you with your lab work results as soon as those results become available The patient with the stress in her life and hopefully all of this will settle down soon.

## 2015-08-10 NOTE — Progress Notes (Signed)
Subjective:    Patient ID: Leah [name], female    DOB: 09-22-1950, 65 y.o.   MRN: [id number]  HPI Pt here for follow up and management of chronic medical problems which includes hypertension and hyperlipidemia. She is taking medications regularly.She complains of some right thigh pain. One year ago she had a CT scan of the abdomen and pelvis and this did show some degenerative changes in the lumbar spine. She's been busy apparent for a family reunion recently and says that she feels like she is aggravated her back and says this has made her leg hurt more. She denies any chest pain or shortness of breath. She did have a recent upper respiratory infection and had more problems with her asthma at that time but this is settled down now. She also complains of some occasional swelling in her left parotid submental area after eating and has had problems with salivary gland in the past. It is not bothering her currently. She denies any trouble with heartburn indigestion nausea vomiting diarrhea or blood in the stool. She is passing her water without problems. She is stressed mostly with dealing with her ex-husband and all of the issues regarding  alimony that he is refusing to pay.      Patient Active Problem List   Diagnosis Date Noted  . Adjustment disorder with mixed anxiety and depressed mood 03/25/2013  . Abdominal pain, chronic, epigastric 11/12/2012  . Nausea alone 11/12/2012  . Personal history of colonic polyps 11/12/2012  . Hypertension, benign essential, goal below 140/90 10/30/2012  . Hyperlipidemia 10/22/2012  . GERD (gastroesophageal reflux disease) 10/22/2012  . Unspecified asthma(493.90) 10/22/2012   Outpatient Encounter Prescriptions as of 08/10/2015  Medication Sig  . albuterol (PROVENTIL HFA;VENTOLIN HFA) 108 (90 BASE) MCG/ACT inhaler Inhale 2 puffs into the lungs every 6 (six) hours as needed for wheezing or shortness of breath.  Marland Kitchen albuterol (PROVENTIL) (2.5 MG/3ML) 0.083%  nebulizer solution Take 3 mLs (2.5 mg total) by nebulization every 6 (six) hours as needed for wheezing or shortness of breath.  Marland Kitchen aspirin 81 MG tablet Take 81 mg by mouth daily.  Marland Kitchen atorvastatin (LIPITOR) 40 MG tablet Take 1 tablet (40 mg total) by mouth daily.  . calcium carbonate (TUMS) 500 MG chewable tablet Chew 1 tablet (200 mg of elemental calcium total) by mouth as needed for indigestion or heartburn.  . Cholecalciferol (VITAMIN D) 2000 UNITS tablet Take 2,000 Units by mouth daily.  . fish oil-omega-3 fatty acids 1000 MG capsule Take 1 g by mouth daily.   . fluticasone (FLONASE) 50 MCG/ACT nasal spray Place 1 spray into both nostrils 2 (two) times daily as needed for allergies or rhinitis.  Marland Kitchen lisinopril-hydrochlorothiazide (PRINZIDE,ZESTORETIC) 20-12.5 MG tablet TAKE 1 TABLET DAILY  . omeprazole (PRILOSEC) 40 MG capsule Take 40 mg by mouth daily.   No facility-administered encounter medications on file as of 08/10/2015.      Review of Systems  Constitutional: Negative.   HENT: Negative.   Eyes: Negative.   Respiratory: Negative.   Cardiovascular: Negative.   Gastrointestinal: Negative.   Endocrine: Negative.   Genitourinary: Negative.   Musculoskeletal: Positive for arthralgias (outer thight pain - right ).  Skin: Negative.   Allergic/Immunologic: Negative.   Neurological: Negative.   Hematological: Negative.   Psychiatric/Behavioral: Negative.        Objective:   Physical Exam  Constitutional: She is oriented to person, place, and time. She appears well-developed and well-nourished.  The patient is pleasant and alert.  HENT:  Head: Normocephalic and atraumatic.  Right Ear: External ear normal.  Left Ear: External ear normal.  Nose: Nose normal.  Mouth/Throat: Oropharynx is clear and moist.  Eyes: Conjunctivae and EOM are normal. Pupils are equal, round, and reactive to light. Right eye exhibits no discharge. Left eye exhibits no discharge. No scleral icterus.  Neck:  Normal range of motion. Neck supple. No thyromegaly present.  No bruits thyromegaly or anterior cervical adenopathy. No signs of any salivary gland swelling in her left neck today.  Cardiovascular: Normal rate, regular rhythm, normal heart sounds and intact distal pulses.   No murmur heard. Heart has a regular rate and rhythm at 72/m  Pulmonary/Chest: Effort normal and breath sounds normal. No respiratory distress. She has no wheezes. She has no rales. She exhibits no tenderness.  Clear anteriorly and posteriorly  Abdominal: Soft. Bowel sounds are normal. She exhibits no mass. There is no tenderness. There is no rebound and no guarding.  Midline abdominal scar from previous surgeries and hernia repair. There is no liver or spleen enlargement no bruits and no masses palpable.  Musculoskeletal: Normal range of motion. She exhibits no edema.  Lymphadenopathy:    She has no cervical adenopathy.  Neurological: She is alert and oriented to person, place, and time. She has normal reflexes. No cranial nerve deficit.  Skin: Skin is warm and dry. No rash noted.  Psychiatric: She has a normal mood and affect. Her behavior is normal. Judgment and thought content normal.  The patient's mood and affect were positive despite the stress she is dealing with with her former husband.  Nursing note and vitals reviewed.  BP 100/68 (BP Location: Left Arm)   Pulse 83   Temp 97.9 F (36.6 C) (Oral)   Ht 5\' 3"  (1.6 m)   Wt 186 lb (84.4 kg)   BMI 32.95 kg/m         Assessment & Plan:  1. Hyperlipidemia -The patient should continue with atorvastatin and aggressive therapeutic lifestyle changes pending results of lab work  2. Essential hypertension -The blood pressure is good today. She should continue with her current antihypertensive medicine and watching her sodium intake.  3. Vitamin D deficiency -Continue vitamin D pending results of lab work  4. Situational anxiety -Continue to work possibly on  the stressful situation in her life  5. Gastroesophageal reflux disease, esophagitis presence not specified -Continue with omeprazole and avoid irritating and highly spiced foods and caffeine  6. Arthralgia -Take Tylenol for pain and avoid heavy lifting and use warm wet compresses as needed for back  Patient Instructions  Continue current medications. Continue good therapeutic lifestyle changes which include good diet and exercise. Fall precautions discussed with patient. If an FOBT was given today- please return it to our front desk. If you are over 24 years old - you may need Prevnar 65 or the adult Pneumonia vaccine.   After your visit with [place] today you will receive a survey in the mail or online from [employer] regarding your care with [place]. Please take a moment to fill this out. Your feedback is very important to [place] as you can help [place] better understand your patient needs as well as improve your experience and satisfaction. WE CARE ABOUT YOU!!!   Try to walk and exercise more and avoid any heavy lifting pushing and pulling Take Tylenol as needed for pain and if necessary use some warm wet compresses on your back The CT scan from a year ago  indicated you had some wear and tear degenerative changes in the low back. We will call you with your lab work results as soon as those results become available The patient with the stress in her life and hopefully all of this will settle down soon.  Arrie Senate MD

## 2015-08-11 ENCOUNTER — Other Ambulatory Visit: Payer: BLUE CROSS/BLUE SHIELD

## 2015-08-11 DIAGNOSIS — E785 Hyperlipidemia, unspecified: Secondary | ICD-10-CM

## 2015-08-12 LAB — HEPATIC FUNCTION PANEL
ALT: 25 IU/L (ref 0–32)
AST: 20 IU/L (ref 0–40)
Albumin: 4.1 g/dL (ref 3.6–4.8)
Alkaline Phosphatase: 69 IU/L (ref 39–117)
Bilirubin Total: 0.3 mg/dL (ref 0.0–1.2)
Bilirubin, Direct: 0.06 mg/dL (ref 0.00–0.40)
Total Protein: 6.1 g/dL (ref 6.0–8.5)

## 2015-08-13 ENCOUNTER — Ambulatory Visit: Payer: BLUE CROSS/BLUE SHIELD | Admitting: Family Medicine

## 2015-08-18 ENCOUNTER — Ambulatory Visit (INDEPENDENT_AMBULATORY_CARE_PROVIDER_SITE_OTHER): Payer: BLUE CROSS/BLUE SHIELD

## 2015-08-18 ENCOUNTER — Encounter: Payer: Self-pay | Admitting: Family Medicine

## 2015-08-18 ENCOUNTER — Ambulatory Visit (INDEPENDENT_AMBULATORY_CARE_PROVIDER_SITE_OTHER): Payer: BLUE CROSS/BLUE SHIELD | Admitting: Family Medicine

## 2015-08-18 ENCOUNTER — Encounter: Payer: BLUE CROSS/BLUE SHIELD | Admitting: Family Medicine

## 2015-08-18 VITALS — BP 108/67 | HR 88 | Temp 98.2°F | Ht 63.0 in | Wt 187.0 lb

## 2015-08-18 DIAGNOSIS — J988 Other specified respiratory disorders: Secondary | ICD-10-CM

## 2015-08-18 DIAGNOSIS — K1120 Sialoadenitis, unspecified: Secondary | ICD-10-CM | POA: Diagnosis not present

## 2015-08-18 DIAGNOSIS — J40 Bronchitis, not specified as acute or chronic: Secondary | ICD-10-CM

## 2015-08-18 DIAGNOSIS — J209 Acute bronchitis, unspecified: Secondary | ICD-10-CM

## 2015-08-18 MED ORDER — PREDNISONE 10 MG PO TABS
ORAL_TABLET | ORAL | 0 refills | Status: DC
Start: 2015-08-18 — End: 2015-08-27

## 2015-08-18 MED ORDER — AZITHROMYCIN 250 MG PO TABS: ORAL_TABLET | ORAL | 0 refills | Status: DC

## 2015-08-18 MED ORDER — METHYLPREDNISOLONE ACETATE 80 MG/ML IJ SUSP
60.0000 mg | Freq: Once | INTRAMUSCULAR | Status: AC
  Administered 2015-08-18: 60 mg via INTRAMUSCULAR

## 2015-08-18 NOTE — Patient Instructions (Signed)
Take Mucinex maximum strength, blue and white in color, plain, 1 twice daily with a large glass of water Use Flonase 1 spray each nostril at bedtime Use nasal saline 1 spray in each nostril 3-4 times daily Take prednisone as directed Take antibiotic as directed Return to clinic in 10 days if not improved We will call with lab results and chest x-ray results as soon as those results become available

## 2015-08-18 NOTE — Progress Notes (Signed)
Subjective:    Patient ID: [name], female    DOB: [birth year], 65 y.o.   MRN: [id number]  HPI Patient here today for breathing issues and congestion that started about 2 days ago.This seems to be more chest congestion and head congestion. She's been getting up some yellow sputum tinged with some blood at times. She had a chest x-ray a little over a year ago and it was within normal limits. She has been using her nebulizer treatment with albuterol more frequently at home. She also continues to have some swelling in this submandibular area at times after eating on the left side. We had planned to do a referral if this continued to happen to ear nose and throat.    Patient Active Problem List   Diagnosis Date Noted  . Adjustment disorder with mixed anxiety and depressed mood 03/25/2013  . Abdominal pain, chronic, epigastric 11/12/2012  . Nausea alone 11/12/2012  . Personal history of colonic polyps 11/12/2012  . Hypertension, benign essential, goal below 140/90 10/30/2012  . Hyperlipidemia 10/22/2012  . GERD (gastroesophageal reflux disease) 10/22/2012  . Unspecified asthma(493.90) 10/22/2012   Outpatient Encounter Prescriptions as of 08/18/2015  Medication Sig  . albuterol (PROVENTIL HFA;VENTOLIN HFA) 108 (90 BASE) MCG/ACT inhaler Inhale 2 puffs into the lungs every 6 (six) hours as needed for wheezing or shortness of breath.  Marland Kitchen albuterol (PROVENTIL) (2.5 MG/3ML) 0.083% nebulizer solution Take 3 mLs (2.5 mg total) by nebulization every 6 (six) hours as needed for wheezing or shortness of breath.  Marland Kitchen aspirin 81 MG tablet Take 81 mg by mouth daily.  Marland Kitchen atorvastatin (LIPITOR) 40 MG tablet Take 1 tablet (40 mg total) by mouth daily.  . calcium carbonate (TUMS) 500 MG chewable tablet Chew 1 tablet (200 mg of elemental calcium total) by mouth as needed for indigestion or heartburn.  . Cholecalciferol (VITAMIN D) 2000 UNITS tablet Take 2,000 Units by mouth daily.  . fish oil-omega-3 fatty  acids 1000 MG capsule Take 1 g by mouth daily.   . fluticasone (FLONASE) 50 MCG/ACT nasal spray Place 1 spray into both nostrils 2 (two) times daily as needed for allergies or rhinitis.  Marland Kitchen lisinopril-hydrochlorothiazide (PRINZIDE,ZESTORETIC) 20-12.5 MG tablet TAKE 1 TABLET DAILY  . omeprazole (PRILOSEC) 40 MG capsule Take 40 mg by mouth daily.   No facility-administered encounter medications on file as of 08/18/2015.       Review of Systems  Constitutional: Negative.   HENT: Positive for congestion.   Eyes: Negative.   Respiratory: Positive for cough (slight with bright red blood), choking (from congestion), shortness of breath and wheezing.   Cardiovascular: Negative.   Gastrointestinal: Negative.   Endocrine: Negative.   Genitourinary: Negative.   Musculoskeletal: Negative.   Skin: Negative.   Allergic/Immunologic: Negative.   Neurological: Negative.   Hematological: Negative.   Psychiatric/Behavioral: Negative.        Objective:   Physical Exam  Constitutional: She is oriented to person, place, and time. She appears well-developed and well-nourished. No distress.  HENT:  Head: Normocephalic and atraumatic.  Right Ear: External ear normal.  Left Ear: External ear normal.  Mouth/Throat: Oropharynx is clear and moist. No oropharyngeal exudate.  Slight nasal congestion  Eyes: Conjunctivae and EOM are normal. Pupils are equal, round, and reactive to light. Right eye exhibits no discharge. Left eye exhibits no discharge. No scleral icterus.  Neck: Normal range of motion. Neck supple. No thyromegaly present.  Cardiovascular: Normal rate, regular rhythm and normal heart sounds.  No murmur heard. Pulmonary/Chest: Effort normal. She has wheezes. She has no rales.  Minimal wheezes right posterior base. No rales. Dry cough.  Musculoskeletal: Normal range of motion.  Lymphadenopathy:    She has no cervical adenopathy.  Neurological: She is alert and oriented to person, place, and  time.  Skin: Skin is warm and dry. No rash noted.  Psychiatric: She has a normal mood and affect. Her behavior is normal. Judgment and thought content normal.  Nursing note and vitals reviewed.  BP 108/67 (BP Location: Left Arm)   Pulse 88   Temp 98.2 F (36.8 C) (Oral)   Ht 5\' 3"  (1.6 m)   Wt 187 lb (84.8 kg)   BMI 33.13 kg/m  [facility] reading (PRIMARY) by  Dr. Brunilda Payor x-ray with results pending                                        Assessment & Plan:  1. Congestion of respiratory tract -Take Mucinex, use nasal saline, use Flonase, and take antibiotic as directed - DG Chest 2 View; Future  2. Salivary gland inflammation - Ambulatory referral to ENT - methylPREDNISolone acetate (DEPO-MEDROL) injection 60 mg; Inject 0.75 mLs (60 mg total) into the muscle once. - DG Chest 2 View; Future  3. Bronchitis with bronchospasm -Z-Pak and prednisone taper  Meds ordered this encounter  Medications  . azithromycin (ZITHROMAX) 250 MG tablet    Sig: As directed    Dispense:  6 tablet    Refill:  0  . predniSONE (DELTASONE) 10 MG tablet    Sig: Take 1 tab QID x 2 days, then 1 tab TID x 2 days, then 1 tab BID x 2 days, then 1 tab QD x 2 days.    Dispense:  20 tablet    Refill:  0  . methylPREDNISolone acetate (DEPO-MEDROL) injection 60 mg   Patient Instructions  Take Mucinex maximum strength, blue and white in color, plain, 1 twice daily with a large glass of water Use Flonase 1 spray each nostril at bedtime Use nasal saline 1 spray in each nostril 3-4 times daily Take prednisone as directed Take antibiotic as directed Return to clinic in 10 days if not improved We will call with lab results and chest x-ray results as soon as those results become available  Arrie Senate MD

## 2015-08-20 ENCOUNTER — Encounter: Payer: Self-pay | Admitting: Family Medicine

## 2015-08-20 ENCOUNTER — Ambulatory Visit (INDEPENDENT_AMBULATORY_CARE_PROVIDER_SITE_OTHER): Payer: BLUE CROSS/BLUE SHIELD | Admitting: Family Medicine

## 2015-08-20 VITALS — BP 116/72 | HR 80 | Temp 98.6°F | Ht 63.0 in | Wt 187.0 lb

## 2015-08-20 DIAGNOSIS — J189 Pneumonia, unspecified organism: Secondary | ICD-10-CM | POA: Diagnosis not present

## 2015-08-20 MED ORDER — CEFDINIR 300 MG PO CAPS
300.0000 mg | ORAL_CAPSULE | Freq: Two times a day (BID) | ORAL | 0 refills | Status: DC
Start: 2015-08-20 — End: 2015-09-08

## 2015-08-20 MED ORDER — CEFTRIAXONE SODIUM 1 G IJ SOLR
1.0000 g | Freq: Once | INTRAMUSCULAR | Status: AC
  Administered 2015-08-20: 1 g via INTRAMUSCULAR

## 2015-08-20 NOTE — Progress Notes (Signed)
Subjective:    Patient ID: Leah [name], female    DOB: 02-12-1950, 65 y.o.   MRN: [id number]  HPI  Patient here today for follow up on pneumonia that was diagnosed 2 days ago.  Patient is coughing up blood-tinged sputum. She is using nebulizer 4 times a day with albuterol. She has not had any fever or chills. I reviewed the x-ray and findings there are consistent with interstitial pneumonitis versus atelectasis. I think the fact that there is blood in the sputum makes me lean more toward pneumonia.     Patient Active Problem List   Diagnosis Date Noted  . Adjustment disorder with mixed anxiety and depressed mood 03/25/2013  . Abdominal pain, chronic, epigastric 11/12/2012  . Nausea alone 11/12/2012  . Personal history of colonic polyps 11/12/2012  . Hypertension, benign essential, goal below 140/90 10/30/2012  . Hyperlipidemia 10/22/2012  . GERD (gastroesophageal reflux disease) 10/22/2012  . Unspecified asthma(493.90) 10/22/2012   Outpatient Encounter Prescriptions as of 08/20/2015  Medication Sig  . albuterol (PROVENTIL HFA;VENTOLIN HFA) 108 (90 BASE) MCG/ACT inhaler Inhale 2 puffs into the lungs every 6 (six) hours as needed for wheezing or shortness of breath.  Marland Kitchen albuterol (PROVENTIL) (2.5 MG/3ML) 0.083% nebulizer solution Take 3 mLs (2.5 mg total) by nebulization every 6 (six) hours as needed for wheezing or shortness of breath.  Marland Kitchen aspirin 81 MG tablet Take 81 mg by mouth daily.  Marland Kitchen atorvastatin (LIPITOR) 40 MG tablet Take 1 tablet (40 mg total) by mouth daily.  Marland Kitchen azithromycin (ZITHROMAX) 250 MG tablet As directed  . calcium carbonate (TUMS) 500 MG chewable tablet Chew 1 tablet (200 mg of elemental calcium total) by mouth as needed for indigestion or heartburn.  . Cholecalciferol (VITAMIN D) 2000 UNITS tablet Take 2,000 Units by mouth daily.  . fish oil-omega-3 fatty acids 1000 MG capsule Take 1 g by mouth daily.   . fluticasone (FLONASE) 50 MCG/ACT nasal spray Place 1 spray  into both nostrils 2 (two) times daily as needed for allergies or rhinitis.  Marland Kitchen lisinopril-hydrochlorothiazide (PRINZIDE,ZESTORETIC) 20-12.5 MG tablet TAKE 1 TABLET DAILY  . omeprazole (PRILOSEC) 40 MG capsule Take 40 mg by mouth daily.  . predniSONE (DELTASONE) 10 MG tablet Take 1 tab QID x 2 days, then 1 tab TID x 2 days, then 1 tab BID x 2 days, then 1 tab QD x 2 days.   No facility-administered encounter medications on file as of 08/20/2015.      Review of Systems  Constitutional: Positive for fatigue. Negative for fever.  HENT: Positive for congestion.   Eyes: Negative.   Respiratory: Positive for cough, shortness of breath and wheezing.   Cardiovascular: Negative.   Gastrointestinal: Negative.   Endocrine: Negative.   Genitourinary: Negative.   Musculoskeletal: Negative.   Skin: Negative.   Allergic/Immunologic: Negative.   Neurological: Negative.   Hematological: Negative.   Psychiatric/Behavioral: Negative.        Objective:   Physical Exam  Constitutional: She is oriented to person, place, and time. She appears well-developed and well-nourished.  She does not appear toxic  Cardiovascular: Normal rate and regular rhythm.   Pulmonary/Chest: Effort normal and breath sounds normal. No respiratory distress. She has no wheezes. She has no rales.  Neurological: She is alert and oriented to person, place, and time.    BP 116/72 (BP Location: Left Arm)   Pulse 80   Temp 98.6 F (37 C) (Oral)   Ht 5\' 3"  (1.6 m)   Wt  187 lb (84.8 kg)   BMI 33.13 kg/m        Assessment & Plan:  1. CAP (community acquired pneumonia) Picture is not totally clear cut. She does not appear sick but she is coughing up blood-tinged sputum and x-ray is suggestive. We'll give her the benefit of doubt that this is pneumonia and give her 1 g of Rocephin IM now discontinued azithromycin in favor of Omnicef 300 mg twice a day. Continue to drink plenty of fluids and take use and asked to help sputum  production She is to follow-up in 3-4 weeks for chest x-ray if not before depending on how she is doing.  Wardell Honour MD

## 2015-08-26 ENCOUNTER — Other Ambulatory Visit: Payer: Self-pay | Admitting: Otolaryngology

## 2015-08-26 DIAGNOSIS — R609 Edema, unspecified: Secondary | ICD-10-CM

## 2015-08-27 ENCOUNTER — Encounter: Payer: Self-pay | Admitting: Family Medicine

## 2015-08-27 ENCOUNTER — Encounter (INDEPENDENT_AMBULATORY_CARE_PROVIDER_SITE_OTHER): Payer: Self-pay

## 2015-08-27 ENCOUNTER — Ambulatory Visit (INDEPENDENT_AMBULATORY_CARE_PROVIDER_SITE_OTHER): Payer: BLUE CROSS/BLUE SHIELD | Admitting: Family Medicine

## 2015-08-27 VITALS — BP 101/63 | HR 77 | Temp 98.3°F | Ht 63.0 in | Wt 186.0 lb

## 2015-08-27 DIAGNOSIS — J40 Bronchitis, not specified as acute or chronic: Secondary | ICD-10-CM

## 2015-08-27 DIAGNOSIS — R220 Localized swelling, mass and lump, head: Secondary | ICD-10-CM

## 2015-08-27 DIAGNOSIS — R221 Localized swelling, mass and lump, neck: Secondary | ICD-10-CM | POA: Diagnosis not present

## 2015-08-27 DIAGNOSIS — J988 Other specified respiratory disorders: Secondary | ICD-10-CM | POA: Diagnosis not present

## 2015-08-27 DIAGNOSIS — R5383 Other fatigue: Secondary | ICD-10-CM | POA: Diagnosis not present

## 2015-08-27 DIAGNOSIS — J209 Acute bronchitis, unspecified: Secondary | ICD-10-CM

## 2015-08-27 NOTE — Progress Notes (Signed)
Subjective:    Patient ID: Leah [name], female    DOB: 1950-02-18, 65 y.o.   MRN: [id number]  HPI Patient here today for 10 day follow up on cough, wheezing and SOB. She is feeling better. The patient saw Dr. Wilburn Cornelia yesterday regarding the submandibular swelling that she's been having and he is planning to get a CT scan of the soft tissue of the neck in the near future. She has been taking Omnicef and is still taking that and says that her breathing is doing better currently. She had an episode this morning of severe abdominal pain followed by a loose bowel movement. She did have some caffeine which may have aggravated the antibiotics on her stomach and she is aware of that. She does not drink milk cheese or ice cream.     Patient Active Problem List   Diagnosis Date Noted  . Adjustment disorder with mixed anxiety and depressed mood 03/25/2013  . Abdominal pain, chronic, epigastric 11/12/2012  . Nausea alone 11/12/2012  . Personal history of colonic polyps 11/12/2012  . Hypertension, benign essential, goal below 140/90 10/30/2012  . Hyperlipidemia 10/22/2012  . GERD (gastroesophageal reflux disease) 10/22/2012  . Unspecified asthma(493.90) 10/22/2012   Outpatient Encounter Prescriptions as of 08/27/2015  Medication Sig  . albuterol (PROVENTIL HFA;VENTOLIN HFA) 108 (90 BASE) MCG/ACT inhaler Inhale 2 puffs into the lungs every 6 (six) hours as needed for wheezing or shortness of breath.  Marland Kitchen albuterol (PROVENTIL) (2.5 MG/3ML) 0.083% nebulizer solution Take 3 mLs (2.5 mg total) by nebulization every 6 (six) hours as needed for wheezing or shortness of breath.  Marland Kitchen aspirin 81 MG tablet Take 81 mg by mouth daily.  Marland Kitchen atorvastatin (LIPITOR) 40 MG tablet Take 1 tablet (40 mg total) by mouth daily.  . calcium carbonate (TUMS) 500 MG chewable tablet Chew 1 tablet (200 mg of elemental calcium total) by mouth as needed for indigestion or heartburn.  . cefdinir (OMNICEF) 300 MG capsule Take 1  capsule (300 mg total) by mouth 2 (two) times daily. 1 po BID  . Cholecalciferol (VITAMIN D) 2000 UNITS tablet Take 2,000 Units by mouth daily.  . fish oil-omega-3 fatty acids 1000 MG capsule Take 1 g by mouth daily.   . fluticasone (FLONASE) 50 MCG/ACT nasal spray Place 1 spray into both nostrils 2 (two) times daily as needed for allergies or rhinitis.  Marland Kitchen lisinopril-hydrochlorothiazide (PRINZIDE,ZESTORETIC) 20-12.5 MG tablet TAKE 1 TABLET DAILY  . omeprazole (PRILOSEC) 40 MG capsule Take 40 mg by mouth daily.  . [DISCONTINUED] azithromycin (ZITHROMAX) 250 MG tablet As directed  . [DISCONTINUED] predniSONE (DELTASONE) 10 MG tablet Take 1 tab QID x 2 days, then 1 tab TID x 2 days, then 1 tab BID x 2 days, then 1 tab QD x 2 days.   No facility-administered encounter medications on file as of 08/27/2015.       Review of Systems  Constitutional: Positive for fatigue ("drained" from episode this am of sudden BM , abd pain and perfuse sweating).  HENT: Negative.   Eyes: Negative.   Respiratory: Negative.   Cardiovascular: Negative.   Gastrointestinal: Negative.   Endocrine: Negative.   Genitourinary: Negative.   Musculoskeletal: Negative.   Skin: Negative.   Allergic/Immunologic: Negative.   Neurological: Negative.   Hematological: Negative.   Psychiatric/Behavioral: Negative.        Objective:   Physical Exam  Constitutional: She is oriented to person, place, and time. She appears well-developed and well-nourished.  HENT:  Head: Normocephalic.  Eyes: Conjunctivae and EOM are normal. Pupils are equal, round, and reactive to light. Right eye exhibits no discharge. Left eye exhibits no discharge. No scleral icterus.  Neck: Normal range of motion.  Cardiovascular: Normal rate, regular rhythm and normal heart sounds.   No murmur heard. Pulmonary/Chest: Effort normal and breath sounds normal. She has no wheezes. She has no rales.  The patient today just has a slightly dry upper airway  cough with no rales or wheezes.  Musculoskeletal: Normal range of motion.  Neurological: She is alert and oriented to person, place, and time.  Skin: No rash noted.  Nursing note and vitals reviewed.  BP 101/63 (BP Location: Left Arm)   Pulse 77   Temp 98.3 F (36.8 C) (Oral)   Ht '5\' 3"'$  (1.6 m)   Wt 186 lb (84.4 kg)   BMI 32.95 kg/m         Assessment & Plan:  1. Other fatigue - CBC with Differential/Platelet - BMP8+EGFR  2. Congestion of respiratory tract -Continue with Mucinex and complete antibiotic  3. Bronchitis with bronchospasm -Continue with albuterol inhaler and/or nebulizer treatment  4. Submandibular swelling -Continue to follow-up with ear nose and throat  Patient Instructions  Continue with Omnicef Continue with Mucinex Continue drinking plenty of fluids and staying well-hydrated Avoid irritating environments Continue with albuterol either through the nebulizer or with the inhaler 3 or 4 times daily Call in 5 days for progress, if improving get chest x-ray 3 weeks after previous chest x-ray for follow-up  Arrie Senate MD

## 2015-08-27 NOTE — Patient Instructions (Signed)
Continue with Omnicef Continue with Mucinex Continue drinking plenty of fluids and staying well-hydrated Avoid irritating environments Continue with albuterol either through the nebulizer or with the inhaler 3 or 4 times daily Call in 5 days for progress, if improving get chest x-ray 3 weeks after previous chest x-ray for follow-up

## 2015-08-28 LAB — CBC WITH DIFFERENTIAL/PLATELET
Basophils Absolute: 0 10*3/uL (ref 0.0–0.2)
Basos: 0 %
EOS (ABSOLUTE): 0.1 10*3/uL (ref 0.0–0.4)
Eos: 1 %
Hematocrit: 40.2 % (ref 34.0–46.6)
Hemoglobin: 12.7 g/dL (ref 11.1–15.9)
Immature Grans (Abs): 0.1 10*3/uL (ref 0.0–0.1)
Immature Granulocytes: 1 %
Lymphocytes Absolute: 2.7 10*3/uL (ref 0.7–3.1)
Lymphs: 34 %
MCH: 27.1 pg (ref 26.6–33.0)
MCHC: 31.6 g/dL (ref 31.5–35.7)
MCV: 86 fL (ref 79–97)
Monocytes Absolute: 0.5 10*3/uL (ref 0.1–0.9)
Monocytes: 6 %
Neutrophils Absolute: 4.6 10*3/uL (ref 1.4–7.0)
Neutrophils: 58 %
Platelets: 289 10*3/uL (ref 150–379)
RBC: 4.68 x10E6/uL (ref 3.77–5.28)
RDW: 14.8 % (ref 12.3–15.4)
WBC: 8 10*3/uL (ref 3.4–10.8)

## 2015-08-28 LAB — BMP8+EGFR
BUN/Creatinine Ratio: 20 (ref 12–28)
BUN: 24 mg/dL (ref 8–27)
CO2: 27 mmol/L (ref 18–29)
Calcium: 9.2 mg/dL (ref 8.7–10.3)
Chloride: 98 mmol/L (ref 96–106)
Creatinine, Ser: 1.2 mg/dL — ABNORMAL HIGH (ref 0.57–1.00)
GFR calc Af Amer: 56 mL/min/{1.73_m2} — ABNORMAL LOW (ref >59)
GFR calc non Af Amer: 48 mL/min/{1.73_m2} — ABNORMAL LOW (ref >59)
Glucose: 99 mg/dL (ref 65–99)
Potassium: 3.8 mmol/L (ref 3.5–5.2)
Sodium: 141 mmol/L (ref 134–144)

## 2015-08-30 ENCOUNTER — Other Ambulatory Visit: Payer: Self-pay | Admitting: *Deleted

## 2015-08-30 DIAGNOSIS — R799 Abnormal finding of blood chemistry, unspecified: Secondary | ICD-10-CM

## 2015-09-02 ENCOUNTER — Telehealth: Payer: Self-pay | Admitting: Family Medicine

## 2015-09-06 ENCOUNTER — Ambulatory Visit
Admission: RE | Admit: 2015-09-06 | Discharge: 2015-09-06 | Disposition: A | Discharge status: Home / Self Care | Payer: BLUE CROSS/BLUE SHIELD | Source: Ambulatory Visit | Attending: Otolaryngology | Admitting: Otolaryngology

## 2015-09-06 DIAGNOSIS — R609 Edema, unspecified: Secondary | ICD-10-CM

## 2015-09-06 MED ORDER — IOPAMIDOL (ISOVUE-300) INJECTION 61%
75.0000 mL | Freq: Once | INTRAVENOUS | Status: AC | PRN
  Administered 2015-09-06: 75 mL via INTRAVENOUS

## 2015-09-08 ENCOUNTER — Encounter: Payer: Self-pay | Admitting: Family Medicine

## 2015-09-08 ENCOUNTER — Ambulatory Visit (INDEPENDENT_AMBULATORY_CARE_PROVIDER_SITE_OTHER): Payer: BLUE CROSS/BLUE SHIELD | Admitting: Family Medicine

## 2015-09-08 VITALS — BP 124/78 | HR 77 | Temp 97.8°F | Ht 63.0 in | Wt 186.1 lb

## 2015-09-08 DIAGNOSIS — K2100 Gastro-esophageal reflux disease with esophagitis, without bleeding: Secondary | ICD-10-CM

## 2015-09-08 MED ORDER — OMEPRAZOLE 40 MG PO CPDR: 40.0000 mg | DELAYED_RELEASE_CAPSULE | Freq: Every day | ORAL | 1 refills | Status: DC

## 2015-09-08 MED ORDER — GI COCKTAIL ~~LOC~~: 30.0000 mL | Freq: Once | ORAL | Status: DC

## 2015-09-08 NOTE — Telephone Encounter (Signed)
Left message if still coughing ,please call us to schedule an appointment.

## 2015-09-08 NOTE — Assessment & Plan Note (Signed)
Concerning the cough is related to GERD, will do GI cocktail

## 2015-09-08 NOTE — Progress Notes (Signed)
BP 124/78 (BP Location: Left Arm, Patient Position: Sitting, Cuff Size: Large)   Pulse 77   Temp 97.8 F (36.6 C) (Oral)   Ht 5' 3" (1.6 m)   Wt 186 lb 2 oz (84.4 kg)   BMI 32.97 kg/m    Subjective:    Patient ID: Leah [name], female    DOB: 12/27/1950, 65 y.o.   MRN: [id number]  HPI: Leah [name] is a 65 y.o. female presenting on 09/08/2015 for Cough (has been treated for pneumonia, has finished antibiotics) and Chest congestion   HPI Persisting cough Patient has been having a persistent cough for the past few weeks. She has been treated with 2 different rounds of antibiotics and a course of prednisone. The symptoms seemed to improve while she was on them and then they worsened after she came off of them. Over the past 3 days she has been having a lot more coughing which is most of the time nonproductive but occasionally she does bring up some sputum. She denies any fevers or chills or shortness of breath or wheezing. Is mostly just the persistent cough. She also feels like she has some soreness in her throat associated with it. She has tried an allergy pill and her albuterol inhalers which have not seemed to make too much of a difference. She is using Flonase occasionally with this. She denies any abdominal pain or acid reflux and belching and burping that she knows of. But she has had a very significant history of acid reflux and is not on anything currently.  Relevant past medical, surgical, family and social history reviewed and updated as indicated. Interim medical history since our last visit reviewed. Allergies and medications reviewed and updated.  Review of Systems  Constitutional: Negative for chills and fever.  HENT: Negative for congestion, ear discharge, ear pain, sinus pressure, sneezing, sore throat and tinnitus.   Eyes: Negative for redness and visual disturbance.  Respiratory: Positive for cough. Negative for chest tightness, shortness of breath and wheezing.     Cardiovascular: Negative for chest pain and leg swelling.  Genitourinary: Negative for difficulty urinating and dysuria.  Musculoskeletal: Negative for back pain and gait problem.  Skin: Negative for rash.  Neurological: Negative for light-headedness and headaches.  Psychiatric/Behavioral: Negative for agitation and behavioral problems.  All other systems reviewed and are negative.   Per HPI unless specifically indicated above     Medication List       Accurate as of 09/08/15  4:36 PM. Always use your most recent med list.          albuterol 108 (90 Base) MCG/ACT inhaler Commonly known as:  PROVENTIL HFA;VENTOLIN HFA Inhale 2 puffs into the lungs every 6 (six) hours as needed for wheezing or shortness of breath.   albuterol (2.5 MG/3ML) 0.083% nebulizer solution Commonly known as:  PROVENTIL Take 3 mLs (2.5 mg total) by nebulization every 6 (six) hours as needed for wheezing or shortness of breath.   aspirin 81 MG tablet Take 81 mg by mouth daily.   atorvastatin 40 MG tablet Commonly known as:  LIPITOR Take 1 tablet (40 mg total) by mouth daily.   calcium carbonate 500 MG chewable tablet Commonly known as:  TUMS Chew 1 tablet (200 mg of elemental calcium total) by mouth as needed for indigestion or heartburn.   fish oil-omega-3 fatty acids 1000 MG capsule Take 1 g by mouth daily.   fluticasone 50 MCG/ACT nasal spray Commonly known as:  FLONASE  Place 1 spray into both nostrils 2 (two) times daily as needed for allergies or rhinitis.   lisinopril-hydrochlorothiazide 20-12.5 MG tablet Commonly known as:  PRINZIDE,ZESTORETIC TAKE 1 TABLET DAILY   omeprazole 40 MG capsule Commonly known as:  PRILOSEC Take 1 capsule (40 mg total) by mouth daily.   Vitamin D 2000 units tablet Take 2,000 Units by mouth daily.          Objective:    BP 124/78 (BP Location: Left Arm, Patient Position: Sitting, Cuff Size: Large)   Pulse 77   Temp 97.8 F (36.6 C) (Oral)   Ht  5' 3" (1.6 m)   Wt 186 lb 2 oz (84.4 kg)   BMI 32.97 kg/m   Wt Readings from Last 3 Encounters:  09/08/15 186 lb 2 oz (84.4 kg)  08/27/15 186 lb (84.4 kg)  08/20/15 187 lb (84.8 kg)    Physical Exam  Constitutional: She is oriented to person, place, and time. She appears well-developed and well-nourished. No distress.  HENT:  Right Ear: External ear normal.  Left Ear: External ear normal.  Nose: Nose normal.  Mouth/Throat: Oropharynx is clear and moist. No oropharyngeal exudate.  Eyes: Conjunctivae and EOM are normal. Pupils are equal, round, and reactive to light.  Neck: Neck supple. No thyromegaly present.  Cardiovascular: Normal rate, regular rhythm, normal heart sounds and intact distal pulses.   No murmur heard. Pulmonary/Chest: Effort normal and breath sounds normal. No respiratory distress. She has no wheezes.  Abdominal: Soft. Bowel sounds are normal. She exhibits no distension. There is no tenderness. There is no rebound.  Musculoskeletal: Normal range of motion. She exhibits no edema or tenderness.  Lymphadenopathy:    She has no cervical adenopathy.  Neurological: She is alert and oriented to person, place, and time. Coordination normal.  Skin: Skin is warm and dry. No rash noted. She is not diaphoretic.  Psychiatric: She has a normal mood and affect. Her behavior is normal.  Nursing note and vitals reviewed.   Results for orders placed or performed in visit on 08/27/15  CBC with Differential/Platelet  Result Value Ref Range   WBC 8.0 3.4 - 10.8 x10E3/uL   RBC 4.68 3.77 - 5.28 x10E6/uL   Hemoglobin 12.7 11.1 - 15.9 g/dL   Hematocrit 40.2 34.0 - 46.6 %   MCV 86 79 - 97 fL   MCH 27.1 26.6 - 33.0 pg   MCHC 31.6 31.5 - 35.7 g/dL   RDW 14.8 12.3 - 15.4 %   Platelets 289 150 - 379 x10E3/uL   Neutrophils 58 %   Lymphs 34 %   Monocytes 6 %   Eos 1 %   Basos 0 %   Neutrophils Absolute 4.6 1.4 - 7.0 x10E3/uL   Lymphocytes Absolute 2.7 0.7 - 3.1 x10E3/uL   Monocytes  Absolute 0.5 0.1 - 0.9 x10E3/uL   EOS (ABSOLUTE) 0.1 0.0 - 0.4 x10E3/uL   Basophils Absolute 0.0 0.0 - 0.2 x10E3/uL   Immature Granulocytes 1 %   Immature Grans (Abs) 0.1 0.0 - 0.1 x10E3/uL  BMP8+EGFR  Result Value Ref Range   Glucose 99 65 - 99 mg/dL   BUN 24 8 - 27 mg/dL   Creatinine, Ser 1.20 (H) 0.57 - 1.00 mg/dL   GFR calc non Af Amer 48 (L) >59 mL/min/1.73   GFR calc Af Amer 56 (L) >59 mL/min/1.73   BUN/Creatinine Ratio 20 12 - 28   Sodium 141 134 - 144 mmol/L   Potassium 3.8 3.5 - 5.2  mmol/L   Chloride 98 96 - 106 mmol/L   CO2 27 18 - 29 mmol/L   Calcium 9.2 8.7 - 10.3 mg/dL      Assessment & Plan:   Problem List Items Addressed This Visit      Digestive   GERD (gastroesophageal reflux disease) - Primary    Concerning the cough is related to GERD, will do GI cocktail      Relevant Medications   gi cocktail (Maalox,Lidocaine,Donnatal)   omeprazole (PRILOSEC) 40 MG capsule    Other Visit Diagnoses   None.      Follow up plan: Return if symptoms worsen or fail to improve.  Counseling provided for all of the vaccine components No orders of the defined types were placed in this encounter.   Caryl Pina, MD [facility] 09/08/2015, 4:36 PM

## 2015-09-14 ENCOUNTER — Telehealth: Payer: Self-pay | Admitting: Family Medicine

## 2015-09-14 NOTE — Telephone Encounter (Signed)
Pt called

## 2015-09-14 NOTE — Telephone Encounter (Signed)
Attempted to speak with the pt but she states she only wants to speak with [facility] nurse Georgina Pillion. Advised pt that Roselyn Reef is currently helping [facility] with pt care. Pt voiced understanding.

## 2015-09-17 ENCOUNTER — Other Ambulatory Visit: Payer: BLUE CROSS/BLUE SHIELD

## 2015-09-17 DIAGNOSIS — Z1211 Encounter for screening for malignant neoplasm of colon: Secondary | ICD-10-CM

## 2015-09-19 LAB — FECAL OCCULT BLOOD, IMMUNOCHEMICAL: Fecal Occult Bld: NEGATIVE

## 2015-09-27 ENCOUNTER — Other Ambulatory Visit: Payer: Self-pay | Admitting: Family Medicine

## 2015-10-27 ENCOUNTER — Ambulatory Visit (INDEPENDENT_AMBULATORY_CARE_PROVIDER_SITE_OTHER): Payer: BLUE CROSS/BLUE SHIELD

## 2015-10-27 DIAGNOSIS — Z23 Encounter for immunization: Secondary | ICD-10-CM | POA: Diagnosis not present

## 2015-11-16 ENCOUNTER — Other Ambulatory Visit: Payer: Self-pay | Admitting: Family Medicine

## 2015-11-16 DIAGNOSIS — E785 Hyperlipidemia, unspecified: Secondary | ICD-10-CM

## 2016-02-16 ENCOUNTER — Ambulatory Visit (INDEPENDENT_AMBULATORY_CARE_PROVIDER_SITE_OTHER): Payer: BLUE CROSS/BLUE SHIELD

## 2016-02-16 ENCOUNTER — Encounter: Payer: Self-pay | Admitting: Family Medicine

## 2016-02-16 ENCOUNTER — Ambulatory Visit (INDEPENDENT_AMBULATORY_CARE_PROVIDER_SITE_OTHER): Payer: BLUE CROSS/BLUE SHIELD | Admitting: Family Medicine

## 2016-02-16 VITALS — BP 111/73 | HR 79 | Temp 97.5°F | Ht 63.0 in | Wt 194.0 lb

## 2016-02-16 DIAGNOSIS — M25512 Pain in left shoulder: Secondary | ICD-10-CM | POA: Diagnosis not present

## 2016-02-16 DIAGNOSIS — Z23 Encounter for immunization: Secondary | ICD-10-CM

## 2016-02-16 DIAGNOSIS — Z0000 Encounter for general adult medical examination without abnormal findings: Secondary | ICD-10-CM

## 2016-02-16 DIAGNOSIS — E7800 Pure hypercholesterolemia, unspecified: Secondary | ICD-10-CM

## 2016-02-16 DIAGNOSIS — I10 Essential (primary) hypertension: Secondary | ICD-10-CM

## 2016-02-16 DIAGNOSIS — M2550 Pain in unspecified joint: Secondary | ICD-10-CM

## 2016-02-16 DIAGNOSIS — E559 Vitamin D deficiency, unspecified: Secondary | ICD-10-CM

## 2016-02-16 DIAGNOSIS — K2100 Gastro-esophageal reflux disease with esophagitis, without bleeding: Secondary | ICD-10-CM

## 2016-02-16 MED ORDER — OMEPRAZOLE 40 MG PO CPDR: 40.0000 mg | DELAYED_RELEASE_CAPSULE | Freq: Every day | ORAL | 3 refills | Status: DC

## 2016-02-16 MED ORDER — ALBUTEROL SULFATE HFA 108 (90 BASE) MCG/ACT IN AERS: 2.0000 | INHALATION_SPRAY | Freq: Four times a day (QID) | RESPIRATORY_TRACT | 6 refills | Status: DC | PRN

## 2016-02-16 MED ORDER — LISINOPRIL-HYDROCHLOROTHIAZIDE 20-12.5 MG PO TABS: 1.0000 | ORAL_TABLET | Freq: Every day | ORAL | 3 refills | Status: DC

## 2016-02-16 MED ORDER — ATORVASTATIN CALCIUM 40 MG PO TABS: 40.0000 mg | ORAL_TABLET | Freq: Every day | ORAL | 3 refills | Status: DC

## 2016-02-16 MED ORDER — FLUTICASONE PROPIONATE 50 MCG/ACT NA SUSP: 1.0000 | Freq: Two times a day (BID) | NASAL | 11 refills | Status: DC | PRN

## 2016-02-16 MED ORDER — ALBUTEROL SULFATE (2.5 MG/3ML) 0.083% IN NEBU: 2.5000 mg | INHALATION_SOLUTION | Freq: Four times a day (QID) | RESPIRATORY_TRACT | 6 refills | Status: DC | PRN

## 2016-02-16 NOTE — Progress Notes (Signed)
Subjective:    Patient ID: Leah [name], female    DOB: 03/19/1950, 66 y.o.   MRN: [id number]  HPI  Pt here for annual complete physical and management of chronic medical problems which includes hyperlipidemia and hypertension. She is taking medication regularly.The patient complains of myalgias and wonders if this could be secondary to her cholesterol medicine. She has started taking co-every 10. She also complains of some left arm soreness. She is requesting refills on several of her medicines. She is due to get her Tdap Today and an EKG today. She will return to the office tomorrow for fasting lab work. She is due to get a Prevnar vaccine but we will wait until she turns 66 for that and then she can get her Pneumovax a year later. The patient is having some left shoulder and left neck pain and left arm pain. This is constant. It is not necessarily associated with activity. She's been having myalgias for a long time and she has started the code Q10 as recommended by the pharmacist. She denies any shortness of breath anymore than usual and has been doing extremely well with her asthma and breathing issues in recent months. She denies any trouble with swallowing and only takes omeprazole on an as needed Lasix. She's had no blood in the stool black tarry bowel movements nausea vomiting or diarrhea. She is passing her water without problems. She did have pneumonia this past summer was supposed to come back and get a repeat chest x-ray so we will get one today along with her left shoulder and C-spine.   Patient Active Problem List   Diagnosis Date Noted  . Adjustment disorder with mixed anxiety and depressed mood 03/25/2013  . Abdominal pain, chronic, epigastric 11/12/2012  . Nausea alone 11/12/2012  . Personal history of colonic polyps 11/12/2012  . Hypertension, benign essential, goal below 140/90 10/30/2012  . Hyperlipidemia 10/22/2012  . GERD (gastroesophageal reflux disease) 10/22/2012  .  Unspecified asthma(493.90) 10/22/2012   Outpatient Encounter Prescriptions as of 02/16/2016  Medication Sig  . albuterol (PROVENTIL HFA;VENTOLIN HFA) 108 (90 BASE) MCG/ACT inhaler Inhale 2 puffs into the lungs every 6 (six) hours as needed for wheezing or shortness of breath.  Marland Kitchen albuterol (PROVENTIL) (2.5 MG/3ML) 0.083% nebulizer solution Take 3 mLs (2.5 mg total) by nebulization every 6 (six) hours as needed for wheezing or shortness of breath.  Marland Kitchen aspirin 81 MG tablet Take 81 mg by mouth daily.  Marland Kitchen atorvastatin (LIPITOR) 40 MG tablet Take 1 tablet (40 mg total) by mouth daily.  . calcium carbonate (TUMS) 500 MG chewable tablet Chew 1 tablet (200 mg of elemental calcium total) by mouth as needed for indigestion or heartburn.  . Cholecalciferol (VITAMIN D) 2000 UNITS tablet Take 2,000 Units by mouth daily.  . Coenzyme Q10 (CO Q 10 PO) Take by mouth.  . fish oil-omega-3 fatty acids 1000 MG capsule Take 1 g by mouth daily.   . fluticasone (FLONASE) 50 MCG/ACT nasal spray Place 1 spray into both nostrils 2 (two) times daily as needed for allergies or rhinitis.  Marland Kitchen lisinopril-hydrochlorothiazide (PRINZIDE,ZESTORETIC) 20-12.5 MG tablet TAKE 1 TABLET DAILY  . omeprazole (PRILOSEC) 40 MG capsule Take 1 capsule (40 mg total) by mouth daily.   Facility-Administered Encounter Medications as of 02/16/2016  Medication  . gi cocktail (Maalox,Lidocaine,Donnatal)      Review of Systems  Constitutional: Negative.   HENT: Negative.   Eyes: Negative.   Respiratory: Negative.   Cardiovascular: Negative.  Gastrointestinal: Negative.   Endocrine: Negative.   Genitourinary: Negative.   Musculoskeletal: Positive for arthralgias (also left arm pain (worse at night)) and myalgias.  Skin: Negative.   Allergic/Immunologic: Negative.   Neurological: Negative.   Hematological: Negative.   Psychiatric/Behavioral: Negative.        Objective:   Physical Exam  Constitutional: She is oriented to person, place,  and time. She appears well-developed and well-nourished. No distress.  The patient is pleasant and alert and upbeat  HENT:  Head: Normocephalic and atraumatic.  Right Ear: External ear normal.  Left Ear: External ear normal.  Mouth/Throat: Oropharynx is clear and moist. No oropharyngeal exudate.  Slight nasal congestion bilaterally  Eyes: Conjunctivae and EOM are normal. Pupils are equal, round, and reactive to light. Right eye exhibits no discharge. Left eye exhibits no discharge. No scleral icterus.  Neck: Normal range of motion. Neck supple. No thyromegaly present.  There is slight tenderness in the upper anterior cervical chain on the left. There is no thyromegaly.  Cardiovascular: Normal rate, regular rhythm, normal heart sounds and intact distal pulses.  Exam reveals no friction rub.   No murmur heard. The heart has a regular rate and rhythm at 72/m  Pulmonary/Chest: Effort normal and breath sounds normal. No respiratory distress. She has no wheezes. She has no rales.  Clear anteriorly and posteriorly  Abdominal: Soft. Bowel sounds are normal. She exhibits no mass. There is tenderness. There is no rebound and no guarding.  Slight abdominal tenderness probably secondary to adhesions.  Musculoskeletal: Normal range of motion. She exhibits no edema.  Lymphadenopathy:    She has no cervical adenopathy.  Neurological: She is alert and oriented to person, place, and time. She has normal reflexes. No cranial nerve deficit.  Skin: Skin is warm and dry. No rash noted.  Psychiatric: She has a normal mood and affect. Her behavior is normal. Judgment and thought content normal.  Nursing note and vitals reviewed.  BP 111/73 (BP Location: Left Arm)   Pulse 79   Temp 97.5 F (36.4 C) (Oral)   Ht '5\' 3"'$  (1.6 m)   Wt 194 lb (88 kg)   BMI 34.37 kg/m   X-rays of left shoulder C-spine and chest are pending=== EKG results are pending===      Assessment & Plan:  1. Gastroesophageal reflux  disease with esophagitis -The patient will continue with omeprazole but will also try some ranitidine instead of omeprazole because of the long-term effects of using proton pump inhibitors. - CBC with Differential/Platelet; Future - Hepatic function panel; Future  2. Vitamin D deficiency -Continue vitamin D replacement pending results of lab work - CBC with Differential/Platelet; Future - VITAMIN D 25 Hydroxy (Vit-D Deficiency, Fractures); Future  3. Essential hypertension -The blood pressure is good today and she will continue with current treatment - BMP8+EGFR; Future - CBC with Differential/Platelet; Future - Hepatic function panel; Future - DG Chest 2 View; Future  4. Pure hypercholesterolemia -Continue with current treatment pending results of lab work and CK report - CBC with Differential/Platelet; Future - NMR, lipoprofile; Future - DG Chest 2 View; Future  5. Arthralgia, unspecified joint -We will check a sedimentation rate and a CK to make sure that cholesterol medicine is not playing a role in this. - CBC with Differential/Platelet; Future - CK; Future - Sedimentation rate; Future  6. Left shoulder pain, unspecified chronicity -We will get x-rays of the C-spine and left shoulder and repeat chest x-ray following a bout with pneumonia from  this past summer. - CBC with Differential/Platelet; Future - EKG 12-Lead - DG Shoulder Left; Future - DG Cervical Spine Complete; Future  Meds ordered this encounter  Medications  . Coenzyme Q10 (CO Q 10 PO)    Sig: Take by mouth.   Patient Instructions                       Medicare Annual Wellness Visit  [facility] and the medical providers at [facility] strive to bring you the best medical care.  In doing so we not only want to address your current medical conditions and concerns but also to detect new conditions early and prevent illness, disease and health-related problems.    Medicare offers a  yearly Wellness Visit which allows our clinical staff to assess your need for preventative services including immunizations, lifestyle education, counseling to decrease risk of preventable diseases and screening for fall risk and other medical concerns.    This visit is provided free of charge (no copay) for all Medicare recipients. The clinical pharmacists at [facility] have begun to conduct these Wellness Visits which will also include a thorough review of all your medications.    As you primary medical provider recommend that you make an appointment for your Annual Wellness Visit if you have not done so already this year.  You may set up this appointment before you leave today or you may call back [phone]) and schedule an appointment.  Please make sure when you call that you mention that you are scheduling your Annual Wellness Visit with the clinical pharmacist so that the appointment may be made for the proper length of time.     Continue current medications. Continue good therapeutic lifestyle changes which include good diet and exercise. Fall precautions discussed with patient. If an FOBT was given today- please return it to our front desk. If you are over 23 years old - you may need Prevnar 83 or the adult Pneumonia vaccine.  **Flu shots are available--- please call and schedule a FLU-CLINIC appointment**  After your visit with [place] today you will receive a survey in the mail or online from [employer] regarding your care with [place]. Please take a moment to fill this out. Your feedback is very important to [place] as you can help [place] better understand your patient needs as well as improve your experience and satisfaction. WE CARE ABOUT YOU!!!   We will call with the results of the x-rays once they become available We will give you a report on the CT scan of your neck from last summer which was okay. The patient should continue to protect her airways and do good hand hygiene  for the rest of the winter months Consider trying ranitidine instead of omeprazole. This is over-the-counter and can be purchased under the equate brand at 150 mg twice daily before breakfast and supper Continue with the cholesterol medicine for the time being pending results of the CK and liver function test    Arrie Senate MD

## 2016-02-16 NOTE — Patient Instructions (Addendum)
Medicare Annual Wellness Visit  [facility] and the medical providers at [facility] strive to bring you the best medical care.  In doing so we not only want to address your current medical conditions and concerns but also to detect new conditions early and prevent illness, disease and health-related problems.    Medicare offers a yearly Wellness Visit which allows our clinical staff to assess your need for preventative services including immunizations, lifestyle education, counseling to decrease risk of preventable diseases and screening for fall risk and other medical concerns.    This visit is provided free of charge (no copay) for all Medicare recipients. The clinical pharmacists at [facility] have begun to conduct these Wellness Visits which will also include a thorough review of all your medications.    As you primary medical provider recommend that you make an appointment for your Annual Wellness Visit if you have not done so already this year.  You may set up this appointment before you leave today or you may call back [phone] and schedule an appointment.  Please make sure when you call that you mention that you are scheduling your Annual Wellness Visit with the clinical pharmacist so that the appointment may be made for the proper length of time.     Continue current medications. Continue good therapeutic lifestyle changes which include good diet and exercise. Fall precautions discussed with patient. If an FOBT was given today- please return it to our front desk. If you are over 16 years old - you may need Prevnar 7 or the adult Pneumonia vaccine.  **Flu shots are available--- please call and schedule a FLU-CLINIC appointment**  After your visit with [place] today you will receive a survey in the mail or online from [employer] regarding your care with [place]. Please take a moment to fill this out. Your feedback is very  important to [place] as you can help [place] better understand your patient needs as well as improve your experience and satisfaction. WE CARE ABOUT YOU!!!   We will call with the results of the x-rays once they become available We will give you a report on the CT scan of your neck from last summer which was okay. The patient should continue to protect her airways and do good hand hygiene for the rest of the winter months Consider trying ranitidine instead of omeprazole. This is over-the-counter and can be purchased under the equate brand at 150 mg twice daily before breakfast and supper Continue with the cholesterol medicine for the time being pending results of the CK and liver function test

## 2016-02-17 ENCOUNTER — Other Ambulatory Visit: Payer: BLUE CROSS/BLUE SHIELD

## 2016-02-17 ENCOUNTER — Telehealth: Payer: Self-pay | Admitting: Family Medicine

## 2016-02-17 DIAGNOSIS — K2100 Gastro-esophageal reflux disease with esophagitis, without bleeding: Secondary | ICD-10-CM

## 2016-02-17 DIAGNOSIS — M25512 Pain in left shoulder: Secondary | ICD-10-CM

## 2016-02-17 DIAGNOSIS — M2550 Pain in unspecified joint: Secondary | ICD-10-CM

## 2016-02-17 DIAGNOSIS — E559 Vitamin D deficiency, unspecified: Secondary | ICD-10-CM

## 2016-02-17 DIAGNOSIS — Z0000 Encounter for general adult medical examination without abnormal findings: Secondary | ICD-10-CM

## 2016-02-17 DIAGNOSIS — I10 Essential (primary) hypertension: Secondary | ICD-10-CM

## 2016-02-17 DIAGNOSIS — E7800 Pure hypercholesterolemia, unspecified: Secondary | ICD-10-CM

## 2016-02-17 LAB — URINALYSIS, COMPLETE
Bilirubin, UA: NEGATIVE
Glucose, UA: NEGATIVE
Ketones, UA: NEGATIVE
Leukocytes, UA: NEGATIVE
Nitrite, UA: NEGATIVE
Protein, UA: NEGATIVE
Specific Gravity, UA: 1.01 (ref 1.005–1.030)
Urobilinogen, Ur: 0.2 mg/dL (ref 0.2–1.0)
pH, UA: 6 (ref 5.0–7.5)

## 2016-02-17 LAB — MICROSCOPIC EXAMINATION: Renal Epithel, UA: NONE SEEN /hpf

## 2016-02-17 NOTE — Telephone Encounter (Signed)
Patient aware spoke with Remo Lipps

## 2016-02-18 LAB — CBC WITH DIFFERENTIAL/PLATELET
Basophils Absolute: 0 10*3/uL (ref 0.0–0.2)
Basos: 1 %
EOS (ABSOLUTE): 0.1 10*3/uL (ref 0.0–0.4)
Eos: 3 %
Hematocrit: 38.3 % (ref 34.0–46.6)
Hemoglobin: 12.4 g/dL (ref 11.1–15.9)
Immature Grans (Abs): 0 10*3/uL (ref 0.0–0.1)
Immature Granulocytes: 0 %
Lymphocytes Absolute: 1.2 10*3/uL (ref 0.7–3.1)
Lymphs: 43 %
MCH: 26.8 pg (ref 26.6–33.0)
MCHC: 32.4 g/dL (ref 31.5–35.7)
MCV: 83 fL (ref 79–97)
Monocytes Absolute: 0.3 10*3/uL (ref 0.1–0.9)
Monocytes: 11 %
Neutrophils Absolute: 1.2 10*3/uL — ABNORMAL LOW (ref 1.4–7.0)
Neutrophils: 42 %
Platelets: 235 10*3/uL (ref 150–379)
RBC: 4.63 x10E6/uL (ref 3.77–5.28)
RDW: 13.5 % (ref 12.3–15.4)
WBC: 2.9 10*3/uL — ABNORMAL LOW (ref 3.4–10.8)

## 2016-02-18 LAB — BMP8+EGFR
BUN/Creatinine Ratio: 22 (ref 12–28)
BUN: 21 mg/dL (ref 8–27)
CO2: 26 mmol/L (ref 18–29)
Calcium: 9 mg/dL (ref 8.7–10.3)
Chloride: 100 mmol/L (ref 96–106)
Creatinine, Ser: 0.97 mg/dL (ref 0.57–1.00)
GFR calc Af Amer: 71 mL/min/{1.73_m2} (ref >59)
GFR calc non Af Amer: 62 mL/min/{1.73_m2} (ref >59)
Glucose: 86 mg/dL (ref 65–99)
Potassium: 4.5 mmol/L (ref 3.5–5.2)
Sodium: 142 mmol/L (ref 134–144)

## 2016-02-18 LAB — HEPATIC FUNCTION PANEL
ALT: 20 IU/L (ref 0–32)
AST: 15 IU/L (ref 0–40)
Albumin: 3.9 g/dL (ref 3.6–4.8)
Alkaline Phosphatase: 79 IU/L (ref 39–117)
Bilirubin Total: 0.3 mg/dL (ref 0.0–1.2)
Bilirubin, Direct: 0.09 mg/dL (ref 0.00–0.40)
Total Protein: 6.3 g/dL (ref 6.0–8.5)

## 2016-02-18 LAB — NMR, LIPOPROFILE
Cholesterol: 164 mg/dL (ref 100–199)
HDL Cholesterol by NMR: 53 mg/dL (ref >39)
HDL Particle Number: 36 umol/L (ref >=30.5)
LDL Particle Number: 1305 nmol/L — ABNORMAL HIGH (ref <1000)
LDL Size: 20.6 nm (ref >20.5)
LDL-C: 95 mg/dL (ref 0–99)
LP-IR Score: 56 — ABNORMAL HIGH (ref <=45)
Small LDL Particle Number: 667 nmol/L — ABNORMAL HIGH (ref <=527)
Triglycerides by NMR: 79 mg/dL (ref 0–149)

## 2016-02-18 LAB — VITAMIN D 25 HYDROXY (VIT D DEFICIENCY, FRACTURES): Vit D, 25-Hydroxy: 52.5 ng/mL (ref 30.0–100.0)

## 2016-02-18 LAB — CK: Total CK: 185 U/L — ABNORMAL HIGH (ref 24–173)

## 2016-02-18 LAB — SEDIMENTATION RATE: Sed Rate: 2 mm/hr (ref 0–40)

## 2016-02-22 ENCOUNTER — Other Ambulatory Visit: Payer: Self-pay | Admitting: *Deleted

## 2016-02-22 DIAGNOSIS — R748 Abnormal levels of other serum enzymes: Secondary | ICD-10-CM

## 2016-02-25 ENCOUNTER — Other Ambulatory Visit: Payer: BLUE CROSS/BLUE SHIELD

## 2016-03-02 ENCOUNTER — Other Ambulatory Visit: Payer: BLUE CROSS/BLUE SHIELD

## 2016-03-02 DIAGNOSIS — R748 Abnormal levels of other serum enzymes: Secondary | ICD-10-CM

## 2016-03-03 LAB — CK: Total CK: 194 U/L — ABNORMAL HIGH (ref 24–173)

## 2016-03-08 ENCOUNTER — Telehealth: Payer: Self-pay | Admitting: *Deleted

## 2016-03-08 NOTE — Telephone Encounter (Signed)
-----   Message from Chipper Herb, MD sent at 03/03/2016  9:35 AM EST ----- The CK remains slightly elevated. She should continue to withhold the cholesterol medication and should have the CK repeated in one month. She should follow-up aggressive therapeutic lifestyle changes with diet and exercise as much as possible

## 2016-03-08 NOTE — Telephone Encounter (Signed)
Left detailed message regarding lab

## 2016-04-17 ENCOUNTER — Telehealth: Payer: Self-pay | Admitting: Family Medicine

## 2016-04-17 DIAGNOSIS — M79605 Pain in left leg: Secondary | ICD-10-CM

## 2016-04-17 DIAGNOSIS — M79604 Pain in right leg: Secondary | ICD-10-CM

## 2016-04-17 DIAGNOSIS — M791 Myalgia, unspecified site: Secondary | ICD-10-CM

## 2016-04-17 NOTE — Telephone Encounter (Signed)
Pt still having a lot of lower body aches, abdomen area down legs mainly at night.  Has been off of Lipitor for over a month now.  Aleve, Tylenol, Advil is upsetting her stomach. Wants to see Dr. Laurance Flatten about what to do next.

## 2016-04-18 ENCOUNTER — Ambulatory Visit (INDEPENDENT_AMBULATORY_CARE_PROVIDER_SITE_OTHER): Payer: BLUE CROSS/BLUE SHIELD

## 2016-04-18 ENCOUNTER — Other Ambulatory Visit: Payer: BLUE CROSS/BLUE SHIELD

## 2016-04-18 DIAGNOSIS — M791 Myalgia, unspecified site: Secondary | ICD-10-CM

## 2016-04-18 DIAGNOSIS — M79604 Pain in right leg: Secondary | ICD-10-CM | POA: Diagnosis not present

## 2016-04-18 DIAGNOSIS — M79605 Pain in left leg: Secondary | ICD-10-CM

## 2016-04-18 NOTE — Telephone Encounter (Signed)
Pt aware that orders were placed for [place] xray and antiCCP

## 2016-04-18 NOTE — Telephone Encounter (Signed)
Get arthritis profile including anti-CC P Get LS spine films and make sure that she has had a recent chest x-ray.

## 2016-04-19 LAB — CYCLIC CITRUL PEPTIDE ANTIBODY, IGG/IGA: Cyclic Citrullin Peptide Ab: 5 units (ref 0–19)

## 2016-04-20 ENCOUNTER — Telehealth: Payer: Self-pay | Admitting: Family Medicine

## 2016-04-20 NOTE — Telephone Encounter (Signed)
Pt called back and is aware of results and scheduled appt with DW[facility] tomorrow at 9:30.

## 2016-04-21 ENCOUNTER — Encounter: Payer: Self-pay | Admitting: Family Medicine

## 2016-04-21 ENCOUNTER — Ambulatory Visit (INDEPENDENT_AMBULATORY_CARE_PROVIDER_SITE_OTHER): Payer: BLUE CROSS/BLUE SHIELD

## 2016-04-21 ENCOUNTER — Ambulatory Visit (INDEPENDENT_AMBULATORY_CARE_PROVIDER_SITE_OTHER): Payer: BLUE CROSS/BLUE SHIELD | Admitting: Family Medicine

## 2016-04-21 VITALS — BP 118/70 | HR 78 | Temp 97.9°F | Ht 63.0 in | Wt 191.0 lb

## 2016-04-21 DIAGNOSIS — M79604 Pain in right leg: Secondary | ICD-10-CM

## 2016-04-21 DIAGNOSIS — M25552 Pain in left hip: Secondary | ICD-10-CM

## 2016-04-21 DIAGNOSIS — W57XXXA Bitten or stung by nonvenomous insect and other nonvenomous arthropods, initial encounter: Secondary | ICD-10-CM

## 2016-04-21 DIAGNOSIS — M79605 Pain in left leg: Secondary | ICD-10-CM

## 2016-04-21 DIAGNOSIS — M791 Myalgia, unspecified site: Secondary | ICD-10-CM

## 2016-04-21 DIAGNOSIS — M25551 Pain in right hip: Secondary | ICD-10-CM

## 2016-04-21 DIAGNOSIS — R590 Localized enlarged lymph nodes: Secondary | ICD-10-CM

## 2016-04-21 DIAGNOSIS — M2550 Pain in unspecified joint: Secondary | ICD-10-CM

## 2016-04-21 MED ORDER — DOXYCYCLINE HYCLATE 100 MG PO TABS: 100.0000 mg | ORAL_TABLET | Freq: Two times a day (BID) | ORAL | 0 refills | Status: DC

## 2016-04-21 NOTE — Patient Instructions (Addendum)
We are doing tick titers today and will start you on doxycycline 100 mg twice daily for 3 weeks with food Continue to stay off of atorvastatin Continue good healthy eating habits and physical activity Call Dr. Ardis Hughs office and confirm when he you need your next colonoscopy We will arrange for you to see the rheumatologist because of the persistent symptoms of arthralgias below the waist it had been going on for some time. Make sure that you take a copy of all of your blood work to visit with the rheumatologist If the patient is still having problems with her hip she will call back in one week and we will schedule her to visit with orthopedic surgeon that comes to this office. We will call with the results of her x-rays as soon as they become available.

## 2016-04-21 NOTE — Progress Notes (Signed)
 Subjective:    Patient ID: Leah [name], female    DOB: 10/02/1950, 66 y.o.   MRN: [id number]  HPI Patient here today for follow up on her lower body aches and pains. She states that the right hip and leg area is worse than the left. The patient had lab work back in February and it did show an elevated CK and she was asked to stay off of the cholesterol medicine. She's had more recent lab work and with the recent lab work and x-rays of her low back everything was stable and the sedimentation rate was normal. She had a CT of the abdomen and pelvis and July 2016 that was normal. She recently had an anti-CCP which was normal. Today she tells us that she's had a recent tick bite. We will add tests for [place] spotted fever and Lyme disease. We will ask her to stay off of the statin drug and will get specific x-rays of her right hip because of the severity of pain there. Patient does come to the visit today with several complaints she has to tick bites one on her right upper chest and the other one on her right lower abdomen. She is concerned that she needs another colonoscopy and we have asked her to call the gastroenterologist office, Dr. Jacobs and he can check and see when that is needed. There is no direct family history of colon cancer. The patient's mother had multiple myeloma. The patient continues to have aches and pains in her lower body and she has not taken any statin drug since back in February. She complains specifically of the right hip hurting her. The tick bite was just recent. All of her labs were reviewed with her including sedimentation rate anti-CCP and see that was elevated. She denies any chest pain or shortness of breath other than having her asthma. She denies any trouble with swallowing heartburn indigestion nausea vomiting diarrhea or blood in the stool. She is passing her water without problems.    Patient Active Problem List   Diagnosis Date Noted  . Adjustment  disorder with mixed anxiety and depressed mood 03/25/2013  . Abdominal pain, chronic, epigastric 11/12/2012  . Nausea alone 11/12/2012  . Personal history of colonic polyps 11/12/2012  . Hypertension, benign essential, goal below 140/90 10/30/2012  . Hyperlipidemia 10/22/2012  . GERD (gastroesophageal reflux disease) 10/22/2012  . Unspecified asthma(493.90) 10/22/2012   Outpatient Encounter Prescriptions as of 04/21/2016  Medication Sig  . albuterol (PROVENTIL HFA;VENTOLIN HFA) 108 (90 Base) MCG/ACT inhaler Inhale 2 puffs into the lungs every 6 (six) hours as needed for wheezing or shortness of breath.  . albuterol (PROVENTIL) (2.5 MG/3ML) 0.083% nebulizer solution Take 3 mLs (2.5 mg total) by nebulization every 6 (six) hours as needed for wheezing or shortness of breath.  . aspirin 81 MG tablet Take 81 mg by mouth daily.  . calcium carbonate (TUMS) 500 MG chewable tablet Chew 1 tablet (200 mg of elemental calcium total) by mouth as needed for indigestion or heartburn.  . Cholecalciferol (VITAMIN D) 2000 UNITS tablet Take 2,000 Units by mouth daily.  . Coenzyme Q10 (CO Q 10 PO) Take by mouth.  . fish oil-omega-3 fatty acids 1000 MG capsule Take 1 g by mouth daily.   . lisinopril-hydrochlorothiazide (PRINZIDE,ZESTORETIC) 20-12.5 MG tablet Take 1 tablet by mouth daily.  . omeprazole (PRILOSEC) 40 MG capsule Take 1 capsule (40 mg total) by mouth daily.  . [DISCONTINUED] fluticasone (FLONASE) 50 MCG/ACT nasal spray   Place 1 spray into both nostrils 2 (two) times daily as needed for allergies or rhinitis.  Marland Kitchen atorvastatin (LIPITOR) 40 MG tablet Take 1 tablet (40 mg total) by mouth daily. (Patient not taking: Reported on 04/21/2016)   Facility-Administered Encounter Medications as of 04/21/2016  Medication  . gi cocktail (Maalox,Lidocaine,Donnatal)     Review of Systems  Constitutional: Negative.   HENT: Negative.   Eyes: Negative.   Respiratory: Negative.   Cardiovascular: Negative.     Gastrointestinal: Negative.   Endocrine: Negative.   Genitourinary: Negative.   Musculoskeletal: Positive for arthralgias (lower body aches - right side is worse).  Skin: Negative.   Allergic/Immunologic: Negative.   Neurological: Negative.   Hematological: Negative.   Psychiatric/Behavioral: Negative.        Objective:   Physical Exam  Constitutional: She is oriented to person, place, and time. She appears well-developed and well-nourished. No distress.  Patient is pleasant and alert and does not appear to be in any acute distress. She is worried about a lot of different things going on with her body.  HENT:  Head: Normocephalic and atraumatic.  Right Ear: External ear normal.  Left Ear: External ear normal.  Mouth/Throat: Oropharynx is clear and moist.  Some nasal congestion and turbinate swelling bilaterally  Eyes: Conjunctivae and EOM are normal. Pupils are equal, round, and reactive to light. Right eye exhibits no discharge. Left eye exhibits no discharge. No scleral icterus.  Neck: Normal range of motion. Neck supple. No thyromegaly present.  The patient does have a single anterior cervical lymph node on the left which is palpable today. She did have a CT scan of the neck back in August of last year and no abnormal findings were noted on the CT scan. This report was reviewed with her and she was given a copy of the report for her records.  Cardiovascular: Normal rate, regular rhythm, normal heart sounds and intact distal pulses.   No murmur heard. Heart has a regular rate and rhythm  Pulmonary/Chest: Effort normal and breath sounds normal. No respiratory distress. She has no wheezes. She has no rales.  Clear anteriorly and posteriorly  Abdominal: Soft. Bowel sounds are normal. She exhibits no mass. There is no tenderness. There is no rebound and no guarding.  The patient does have a midline surgical scar and she is slightly tender around the scar in the upper abdomen. There is  no liver or spleen enlargement, no masses and no bruits. There is no inguinal adenopathy.  Musculoskeletal: Normal range of motion. She exhibits tenderness. She exhibits no edema.  There is some tenderness in the right lateral hip area with outpatient and pressure. There is no rash. 60 of Depo-Medrol was injected into the area of tenderness after good cleansing with Betadine. The patient tolerated the procedure well with no problems being noted. A pressure dressing was applied after the injection.  Lymphadenopathy:    She has cervical adenopathy.  Neurological: She is alert and oriented to person, place, and time. She has normal reflexes. No cranial nerve deficit.  Skin: Skin is warm and dry.  2 tick bites noted on right upper chest and right lower abdomen.  Psychiatric: She has a normal mood and affect. Her behavior is normal. Judgment and thought content normal.  Nursing note and vitals reviewed.  BP 118/70 (BP Location: Left Arm)   Pulse 78   Temp 97.9 F (36.6 C) (Oral)   Ht 5' 3" (1.6 m)   Wt 191 lb (86.6  kg)   BMI 33.83 kg/m         Assessment & Plan:  1. Bilateral hip pain -60 mg of Depo-Medrol was injected into the area of discomfort in the right lateral hip. The patient tolerated this procedure well. Pressure dressing was applied. - Ambulatory referral to Rheumatology - DG HIP UNILAT W OR W/O PELVIS 2-3 VIEWS RIGHT; Future  2. Bilateral leg pain -She has had recent LS spine films which were normal. - Ambulatory referral to Rheumatology - DG HIP UNILAT W OR W/O PELVIS 2-3 VIEWS RIGHT; Future  3. Myalgia -She has had multiple tests for arthritis and inflammation and all of these were negative including the anti-CCP and sedimentation rate. - Ambulatory referral to Rheumatology  4. Arthralgia, unspecified joint -No reason found for the persistent arthralgias in the lower extremities - Ambulatory referral to Rheumatology  5. Tick bite, initial encounter -Start  doxycycline 100 mg twice daily with food for 3 weeks.  6. Lymphadenopathy of left cervical region -Recent CT scan of neck was negative for any abnormal findings.  Meds ordered this encounter  Medications  . doxycycline (VIBRA-TABS) 100 MG tablet    Sig: Take 1 tablet (100 mg total) by mouth 2 (two) times daily. 1 po bid    Dispense:  20 tablet    Refill:  0   Patient Instructions  We are doing tick titers today and will start you on doxycycline 100 mg twice daily for 3 weeks with food Continue to stay off of atorvastatin Continue good healthy eating habits and physical activity Call Dr. Ardis Hughs office and confirm when he you need your next colonoscopy We will arrange for you to see the rheumatologist because of the persistent symptoms of arthralgias below the waist it had been going on for some time. Make sure that you take a copy of all of your blood work to visit with the rheumatologist  Arrie Senate MD

## 2016-05-13 ENCOUNTER — Ambulatory Visit (INDEPENDENT_AMBULATORY_CARE_PROVIDER_SITE_OTHER): Payer: BLUE CROSS/BLUE SHIELD | Admitting: Physician Assistant

## 2016-05-13 ENCOUNTER — Encounter: Payer: Self-pay | Admitting: Physician Assistant

## 2016-05-13 VITALS — BP 139/87 | HR 73 | Temp 98.1°F | Ht 63.0 in | Wt 188.0 lb

## 2016-05-13 DIAGNOSIS — B029 Zoster without complications: Secondary | ICD-10-CM

## 2016-05-13 MED ORDER — VALACYCLOVIR HCL 1 G PO TABS: 1000.0000 mg | ORAL_TABLET | Freq: Two times a day (BID) | ORAL | 0 refills | Status: DC

## 2016-05-13 NOTE — Progress Notes (Signed)
BP 139/87   Pulse 73   Temp 98.1 F (36.7 C) (Oral)   Ht 5\' 3"  (1.6 m)   Wt 188 lb (85.3 kg)   BMI 33.30 kg/m    Subjective:    Patient ID: Leah [name], female    DOB: 1950/11/24, 66 y.o.   MRN: [id number]  HPI: Leah [name] is a 66 y.o. female presenting on 05/13/2016 for Rash on lower abdomen (itchy, came up 3 days ago)  Rash and pain for 3 days, pain is 8/10 level and sharp. Area on the lower right abdomen with fullness and pain. No blisters seen yet. Had exposure this past month with a person she cares for.  Relevant past medical, surgical, family and social history reviewed and updated as indicated. Allergies and medications reviewed and updated.  Past Medical History:  Diagnosis Date  . Asthma   . Colon polyp   . Depression   . GERD (gastroesophageal reflux disease)   . Hyperlipidemia   . Hypertension     Past Surgical History:  Procedure Laterality Date  . ABDOMINAL HYSTERECTOMY    . Carpel Tunnel    . CHOLECYSTECTOMY    . COLONOSCOPY  02/08/2005   ERX:[id number] hemorrhoids otherwise normal rectum/ A few scattered, shallow, left-sided sigmoid diverticula/The remainder of the colonic mucosa appeared normal  . COLONOSCOPY WITH ESOPHAGOGASTRODUODENOSCOPY (EGD) N/A 11/29/2012   Procedure: COLONOSCOPY WITH ESOPHAGOGASTRODUODENOSCOPY (EGD);  Surgeon: Daneil Dolin, MD;  Location: [facility];  Service: Endoscopy;  Laterality: N/A;  10:45  . HERNIA REPAIR     ventral X 2 with mesh, last one 2011 ([place]). complicated with 2 week hospital stay  . NISSEN FUNDOPLICATION  1950  . TUBAL LIGATION      Review of Systems  Constitutional: Negative.   HENT: Negative.   Eyes: Negative.   Respiratory: Negative.   Gastrointestinal: Negative.   Genitourinary: Negative.   Skin: Positive for color change, rash and wound.    Allergies as of 05/13/2016      Reactions   Codeine Nausea And Vomiting   Penicillins Other (See Comments)   Does not know        Medication List       Accurate as of 05/13/16 11:51 AM. Always use your most recent med list.          albuterol (2.5 MG/3ML) 0.083% nebulizer solution Commonly known as:  PROVENTIL Take 3 mLs (2.5 mg total) by nebulization every 6 (six) hours as needed for wheezing or shortness of breath.   albuterol 108 (90 Base) MCG/ACT inhaler Commonly known as:  PROVENTIL HFA;VENTOLIN HFA Inhale 2 puffs into the lungs every 6 (six) hours as needed for wheezing or shortness of breath.   aspirin 81 MG tablet Take 81 mg by mouth daily.   atorvastatin 40 MG tablet Commonly known as:  LIPITOR Take 1 tablet (40 mg total) by mouth daily.   calcium carbonate 500 MG chewable tablet Commonly known as:  TUMS Chew 1 tablet (200 mg of elemental calcium total) by mouth as needed for indigestion or heartburn.   CO Q 10 PO Take by mouth.   fish oil-omega-3 fatty acids 1000 MG capsule Take 1 g by mouth daily.   lisinopril-hydrochlorothiazide 20-12.5 MG tablet Commonly known as:  PRINZIDE,ZESTORETIC Take 1 tablet by mouth daily.   omeprazole 40 MG capsule Commonly known as:  PRILOSEC Take 1 capsule (40 mg total) by mouth daily.   valACYclovir 1000 MG tablet Commonly known as:  VALTREX Take 1 tablet (1,000 mg total) by mouth 2 (two) times daily.   Vitamin D 2000 units tablet Take 2,000 Units by mouth daily.          Objective:    BP 139/87   Pulse 73   Temp 98.1 F (36.7 C) (Oral)   Ht 5\' 3"  (1.6 m)   Wt 188 lb (85.3 kg)   BMI 33.30 kg/m   Allergies  Allergen Reactions  . Codeine Nausea And Vomiting  . Penicillins Other (See Comments)    Does not know    Physical Exam  Constitutional: She is oriented to person, place, and time. She appears well-developed and well-nourished.  HENT:  Head: Normocephalic and atraumatic.  Eyes: Conjunctivae and EOM are normal. Pupils are equal, round, and reactive to light.  Cardiovascular: Normal rate, regular rhythm, normal heart sounds and  intact distal pulses.   Pulmonary/Chest: Effort normal and breath sounds normal.  Abdominal: Soft. Bowel sounds are normal.  Neurological: She is alert and oriented to person, place, and time. She has normal reflexes.  Skin: Skin is warm and dry. Lesion and rash noted. Rash is macular. There is erythema.     Three areas of redness and slight swelling. No blisters. Lower abdomen appears slightly swollen.  Psychiatric: She has a normal mood and affect. Her behavior is normal. Judgment and thought content normal.        Assessment & Plan:   1. Herpes zoster without complication - valACYclovir (VALTREX) 1000 MG tablet; Take 1 tablet (1,000 mg total) by mouth 2 (two) times daily.  Dispense: 20 tablet; Refill: 0   Current Outpatient Prescriptions:  .  albuterol (PROVENTIL HFA;VENTOLIN HFA) 108 (90 Base) MCG/ACT inhaler, Inhale 2 puffs into the lungs every 6 (six) hours as needed for wheezing or shortness of breath., Disp: 1 Inhaler, Rfl: 6 .  albuterol (PROVENTIL) (2.5 MG/3ML) 0.083% nebulizer solution, Take 3 mLs (2.5 mg total) by nebulization every 6 (six) hours as needed for wheezing or shortness of breath., Disp: 150 mL, Rfl: 6 .  aspirin 81 MG tablet, Take 81 mg by mouth daily., Disp: , Rfl:  .  calcium carbonate (TUMS) 500 MG chewable tablet, Chew 1 tablet (200 mg of elemental calcium total) by mouth as needed for indigestion or heartburn., Disp: , Rfl:  .  Cholecalciferol (VITAMIN D) 2000 UNITS tablet, Take 2,000 Units by mouth daily., Disp: , Rfl:  .  Coenzyme Q10 (CO Q 10 PO), Take by mouth., Disp: , Rfl:  .  fish oil-omega-3 fatty acids 1000 MG capsule, Take 1 g by mouth daily. , Disp: , Rfl:  .  lisinopril-hydrochlorothiazide (PRINZIDE,ZESTORETIC) 20-12.5 MG tablet, Take 1 tablet by mouth daily., Disp: 90 tablet, Rfl: 3 .  omeprazole (PRILOSEC) 40 MG capsule, Take 1 capsule (40 mg total) by mouth daily., Disp: 90 capsule, Rfl: 3 .  atorvastatin (LIPITOR) 40 MG tablet, Take 1 tablet  (40 mg total) by mouth daily. (Patient not taking: Reported on 04/21/2016), Disp: 90 tablet, Rfl: 3 .  valACYclovir (VALTREX) 1000 MG tablet, Take 1 tablet (1,000 mg total) by mouth 2 (two) times daily., Disp: 20 tablet, Rfl: 0  Current Facility-Administered Medications:  .  gi cocktail (Maalox,Lidocaine,Donnatal), 30 mL, Oral, Once, Worthy Rancher, MD  Continue all other maintenance medications as listed above.  Follow up plan: Return if symptoms worsen or fail to improve.  Educational handout given for shingles  Terald Sleeper PA-C [facility] [address]  [place], [place] [address] [phone]   05/13/2016, 11:51 AM

## 2016-05-13 NOTE — Patient Instructions (Signed)
Shingles Shingles, which is also known as herpes zoster, is an infection that causes a painful skin rash and fluid-filled blisters. Shingles is not related to genital herpes, which is a sexually transmitted infection. Shingles only develops in people who:  Have had chickenpox.  Have received the chickenpox vaccine. (This is rare.)  What are the causes? Shingles is caused by varicella-zoster virus (VZV). This is the same virus that causes chickenpox. After exposure to VZV, the virus stays in the body in an inactive (dormant) state. Shingles develops if the virus reactivates. This can happen many years after the initial exposure to VZV. It is not known what causes this virus to reactivate. What increases the risk? People who have had chickenpox or received the chickenpox vaccine are at risk for shingles. Infection is more common in people who:  Are older than age 50.  Have a weakened defense (immune) system, such as those with HIV, AIDS, or cancer.  Are taking medicines that weaken the immune system, such as transplant medicines.  Are under great stress.  What are the signs or symptoms? Early symptoms of this condition include itching, tingling, and pain in an area on your skin. Pain may be described as burning, stabbing, or throbbing. A few days or weeks after symptoms start, a painful red rash appears, usually on one side of the body in a bandlike or beltlike pattern. The rash eventually turns into fluid-filled blisters that break open, scab over, and dry up in about 2-3 weeks. At any time during the infection, you may also develop:  A fever.  Chills.  A headache.  An upset stomach.  How is this diagnosed? This condition is diagnosed with a skin exam. Sometimes, skin or fluid samples are taken from the blisters before a diagnosis is made. These samples are examined under a microscope or sent to a lab for testing. How is this treated? There is no specific cure for this condition.  Your health care provider will probably prescribe medicines to help you manage pain, recover more quickly, and avoid long-term problems. Medicines may include:  Antiviral drugs.  Anti-inflammatory drugs.  Pain medicines.  If the area involved is on your face, you may be referred to a specialist, such as an eye doctor (ophthalmologist) or an ear, nose, and throat (ENT) doctor to help you avoid eye problems, chronic pain, or disability. Follow these instructions at home: Medicines  Take medicines only as directed by your health care provider.  Apply an anti-itch or numbing cream to the affected area as directed by your health care provider. Blister and Rash Care  Take a cool bath or apply cool compresses to the area of the rash or blisters as directed by your health care provider. This may help with pain and itching.  Keep your rash covered with a loose bandage (dressing). Wear loose-fitting clothing to help ease the pain of material rubbing against the rash.  Keep your rash and blisters clean with mild soap and cool water or as directed by your health care provider.  Check your rash every day for signs of infection. These include redness, swelling, and pain that lasts or increases.  Do not pick your blisters.  Do not scratch your rash. General instructions  Rest as directed by your health care provider.  Keep all follow-up visits as directed by your health care provider. This is important.  Until your blisters scab over, your infection can cause chickenpox in people who have never had it or been vaccinated   against it. To prevent this from happening, avoid contact with other people, especially: ? Babies. ? Pregnant women. ? Children who have eczema. ? Elderly people who have transplants. ? People who have chronic illnesses, such as leukemia or AIDS. Contact a health care provider if:  Your pain is not relieved with prescribed medicines.  Your pain does not get better after  the rash heals.  Your rash looks infected. Signs of infection include redness, swelling, and pain that lasts or increases. Get help right away if:  The rash is on your face or nose.  You have facial pain, pain around your eye area, or loss of feeling on one side of your face.  You have ear pain or you have ringing in your ear.  You have loss of taste.  Your condition gets worse. This information is not intended to replace advice given to you by your health care provider. Make sure you discuss any questions you have with your health care provider. Document Released: 01/02/2005 Document Revised: 08/29/2015 Document Reviewed: 11/13/2013 Elsevier Interactive Patient Education  2017 [employer]  

## 2016-05-30 ENCOUNTER — Encounter: Payer: Self-pay | Admitting: Family

## 2016-05-30 ENCOUNTER — Ambulatory Visit (INDEPENDENT_AMBULATORY_CARE_PROVIDER_SITE_OTHER): Payer: BLUE CROSS/BLUE SHIELD | Admitting: Family

## 2016-05-30 VITALS — BP 128/84 | HR 80 | Temp 97.8°F | Ht 63.0 in | Wt 189.4 lb

## 2016-05-30 DIAGNOSIS — W57XXXA Bitten or stung by nonvenomous insect and other nonvenomous arthropods, initial encounter: Secondary | ICD-10-CM

## 2016-05-30 DIAGNOSIS — S80862A Insect bite (nonvenomous), left lower leg, initial encounter: Secondary | ICD-10-CM

## 2016-05-30 MED ORDER — DOXYCYCLINE HYCLATE 100 MG PO TABS: 200.0000 mg | ORAL_TABLET | Freq: Once | ORAL | 0 refills | Status: AC

## 2016-05-30 NOTE — Patient Instructions (Signed)
Tick Bite Information, Adult Ticks are insects that draw blood for food. Most ticks live in shrubs and grassy areas. They climb onto people and animals that brush against the leaves and grasses that they rest on. Then they bite, attaching themselves to the skin. Most ticks are harmless, but some ticks carry germs that can spread to a person through a bite and cause a disease. To reduce your risk of getting a disease from a tick bite, it is important to take steps to prevent tick bites. It is also important to check for ticks after being outdoors. If you find that a tick has attached to you, watch for symptoms of disease. How can I prevent tick bites? Take these steps to help prevent tick bites when you are outdoors in an area where ticks are found:  Use insect repellent that has DEET (20% or higher), picaridin, or IR3535 in it. Use it on:  Skin that is showing.  The top of your boots.  Your pant legs.  Your sleeve cuffs.  For repellent products that contain permethrin, follow product instructions. Use these products on:  Clothing.  Gear.  Boots.  Tents.  Wear protective clothing. Long sleeves and long pants offer the best protection from ticks.  Wear light-colored clothing so you can see ticks more easily.  Tuck your pant legs into your socks.  If you go walking on a trail, stay in the middle of the trail so your skin, hair, and clothing do not touch the bushes.  Avoid walking through areas with long grass.  Check for ticks on your clothing, hair, and skin often while you are outside, and check again before you go inside. Make sure to check the places that ticks attach themselves most often. These places include the scalp, neck, armpits, waist, groin, and joint areas. Ticks that carry a disease called Lyme disease have to be attached to the skin for 24-48 hours. Checking for ticks every day will lessen your risk of this and other diseases.  When you come indoors, wash your  clothes and take a shower or a bath right away. Dry your clothes in a dryer on high heat for at least 60 minutes. This will kill any ticks in your clothes. What is the proper way to remove a tick? If you find a tick on your body, remove it as soon as possible. Removing a tick sooner rather than later can prevent germs from passing from the tick to your body. To remove a tick that is crawling on your skin but has not bitten:  Go outdoors and brush the tick off.  Remove the tick with tape or a lint roller. To remove a tick that is attached to your skin:  Wash your hands.  If you have latex gloves, put them on.  Use tweezers, curved forceps, or a tick-removal tool to gently grasp the tick as close to your skin and the tick's head as possible.  Gently pull with steady, upward pressure until the tick lets go. When removing the tick:  Take care to keep the tick's head attached to its body.  Do not twist or jerk the tick. This can make the tick's head or mouth break off.  Do not squeeze or crush the tick's body. This could force disease-carrying fluids from the tick into your body. Do not try to remove a tick with heat, alcohol, petroleum jelly, or fingernail polish. Using these methods can cause the tick to salivate and regurgitate into your  bloodstream, increasing your risk of getting a disease. What should I do after removing a tick?  Clean the bite area with soap and water, rubbing alcohol, or an iodine scrub.  If an antiseptic cream or ointment is available, apply a small amount to the bite site.  Wash and disinfect any instruments that you used to remove the tick. How should I dispose of a tick? To dispose of a live tick, use one of these methods:  Place it in rubbing alcohol.  Place it in a sealed bag or container.  Wrap it tightly in tape.  Flush it down the toilet. Contact a health care provider if:  You have symptoms of a disease after a tick bite. Symptoms of a  tick-borne disease can occur from moments after the tick bites to up to 30 days after a tick is removed. Symptoms include:  Muscle, joint, or bone pain.  Difficulty walking or moving your legs.  Numbness in the legs.  Paralysis.  Red rash around the tick bite area that is shaped like a target or a "bull's-eye."  Redness and swelling in the area of the tick bite.  Fever.  Repeated vomiting.  Diarrhea.  Weight loss.  Tender, swollen lymph glands.  Shortness of breath.  Cough.  Pain in the abdomen.  Headache.  Abnormal tiredness.  A change in your level of consciousness.  Confusion. Get help right away if:  You are not able to remove a tick.  A part of a tick breaks off and gets stuck in your skin.  Your symptoms get worse. Summary  Ticks may carry germs that can spread to a person through a bite and cause disease.  Wear protective clothing and use insect repellent to prevent tick bites. Follow product instructions.  If you find a tick on your body, remove it as soon as possible. If the tick is attached, do not try to remove with heat, alcohol, petroleum jelly, or fingernail polish.  Remove the attached tick using tweezers, curved forceps, or a tick-removal tool. Gently pull with steady, upward pressure until the tick lets go. Do not twist or jerk the tick. Do not squeeze or crush the tick's body.  If you have symptoms after being bitten by a tick, contact a health care provider. This information is not intended to replace advice given to you by your health care provider. Make sure you discuss any questions you have with your health care provider. Document Released: 12/31/1999 Document Revised: 10/15/2015 Document Reviewed: 10/15/2015 Elsevier Interactive Patient Education  2017 [employer].

## 2016-05-30 NOTE — Progress Notes (Signed)
   Subjective:    Patient ID: Leah [name], female    DOB: [birth year], 66 y.o.   MRN: [id number]  HPI Pt presents to the office after a tick bite on left calf that occurred two days ago. PT states it was only attached "15-20 mins". Pt states she removed it, but has itching at the area. Pt denies any fever, joint pain, or rash.    Review of Systems  Skin: Positive for rash.  All other systems reviewed and are negative.      Objective:   Physical Exam  Constitutional: She is oriented to person, place, and time. She appears well-developed and well-nourished. No distress.  HENT:  Head: Normocephalic.  Eyes: Pupils are equal, round, and reactive to light.  Neck: Normal range of motion. Neck supple. No thyromegaly present.  Cardiovascular: Normal rate, regular rhythm, normal heart sounds and intact distal pulses.   No murmur heard. Pulmonary/Chest: Effort normal and breath sounds normal. No respiratory distress. She has no wheezes.  Abdominal: Soft. Bowel sounds are normal. She exhibits no distension. There is no tenderness.  Musculoskeletal: Normal range of motion. She exhibits no edema or tenderness.  Neurological: She is alert and oriented to person, place, and time.  Skin: Skin is warm and dry. Lesion (0.2 cm circular  lesion with no fever, erythemas, or discharge on left upper medial calf) noted.  Psychiatric: She has a normal mood and affect. Her behavior is normal. Judgment and thought content normal.  Vitals reviewed.     BP 128/84 (BP Location: Left Arm, Patient Position: Sitting, Cuff Size: Large)   Pulse 80   Temp 97.8 F (36.6 C) (Oral)   Ht 5\' 3"  (1.6 m)   Wt 189 lb 6.4 oz (85.9 kg)   BMI 33.55 kg/m      Assessment & Plan:  1. Tick bite of left calf, initial encounter -Pt to report any new fever, joint pain, or rash -Wear protective clothing while outside- Long sleeves and long pants -Put insect repellent on all exposed skin and along clothing -Take a shower  as soon as possible after being outside RTO prn  - doxycycline (VIBRA-TABS) 100 MG tablet; Take 2 tablets (200 mg total) by mouth once.  Dispense: 2 tablet; Refill: 0  Evelina Dun, FNP

## 2016-06-17 ENCOUNTER — Ambulatory Visit (INDEPENDENT_AMBULATORY_CARE_PROVIDER_SITE_OTHER): Payer: BLUE CROSS/BLUE SHIELD | Admitting: Family Medicine

## 2016-06-17 VITALS — BP 130/77 | HR 83 | Temp 97.4°F | Ht 63.0 in | Wt 186.4 lb

## 2016-06-17 DIAGNOSIS — W57XXXA Bitten or stung by nonvenomous insect and other nonvenomous arthropods, initial encounter: Secondary | ICD-10-CM

## 2016-06-17 DIAGNOSIS — R52 Pain, unspecified: Secondary | ICD-10-CM

## 2016-06-17 DIAGNOSIS — M791 Myalgia: Secondary | ICD-10-CM

## 2016-06-17 DIAGNOSIS — S70362A Insect bite (nonvenomous), left thigh, initial encounter: Secondary | ICD-10-CM

## 2016-06-17 MED ORDER — DOXYCYCLINE HYCLATE 100 MG PO TABS: 100.0000 mg | ORAL_TABLET | Freq: Two times a day (BID) | ORAL | 0 refills | Status: DC

## 2016-06-17 NOTE — Progress Notes (Signed)
BP 130/77 (BP Location: Left Arm, Patient Position: Sitting, Cuff Size: Normal)   Pulse 83   Temp 97.4 F (36.3 C) (Oral)   Ht '5\' 3"'$  (1.6 m)   Wt 186 lb 6.4 oz (84.6 kg)   BMI 33.02 kg/m    Subjective:    Patient ID: Leah [name], female    DOB: Feb 13, 1950, 66 y.o.   MRN: [id number]  HPI: Leah [name] is a 66 y.o. female presenting on 06/17/2016 for Generalized Body Aches   HPI Tick bite and generalized body aches Patient has had 2 tick bites, one on left inner thigh near her knee and 1 in right inguinal region. The one was 4 weeks ago and the other was 2 weeks ago. Over the past week she has had increasing body aches that are generalized but worse in her right arm and right leg and left leg. She has a general ill feeling. She denies any fevers or chills but just has the body aches and generalized ill feeling. She does have some nausea intermittently but no vomiting or diarrhea. She denies any cough or congestion or fever or chills. One of the tick bites in her inguinal region has started to flare up and get a little red around where the bite was.  Relevant past medical, surgical, family and social history reviewed and updated as indicated. Interim medical history since our last visit reviewed. Allergies and medications reviewed and updated.  Review of Systems  Constitutional: Negative for chills and fever.  HENT: Negative for congestion, ear discharge, ear pain, sinus pain and sinus pressure.   Eyes: Negative for redness and visual disturbance.  Respiratory: Negative for chest tightness and shortness of breath.   Cardiovascular: Negative for chest pain and leg swelling.  Genitourinary: Negative for difficulty urinating and dysuria.  Musculoskeletal: Positive for arthralgias and myalgias. Negative for back pain and gait problem.  Skin: Positive for color change. Negative for rash.  Neurological: Negative for light-headedness and headaches.  Psychiatric/Behavioral: Negative  for agitation and behavioral problems.  All other systems reviewed and are negative.   Per HPI unless specifically indicated above        Objective:    BP 130/77 (BP Location: Left Arm, Patient Position: Sitting, Cuff Size: Normal)   Pulse 83   Temp 97.4 F (36.3 C) (Oral)   Ht '5\' 3"'$  (1.6 m)   Wt 186 lb 6.4 oz (84.6 kg)   BMI 33.02 kg/m   Wt Readings from Last 3 Encounters:  06/17/16 186 lb 6.4 oz (84.6 kg)  05/30/16 189 lb 6.4 oz (85.9 kg)  05/13/16 188 lb (85.3 kg)    Physical Exam  Constitutional: She is oriented to person, place, and time. She appears well-developed and well-nourished. No distress.  Eyes: Conjunctivae are normal.  Neck: Neck supple. No thyromegaly present.  Cardiovascular: Normal rate, regular rhythm, normal heart sounds and intact distal pulses.   No murmur heard. Pulmonary/Chest: Effort normal and breath sounds normal. No respiratory distress. She has no wheezes. She has no rales.  Musculoskeletal: Normal range of motion. She exhibits tenderness (Diffuse myalgias, mild in severity on exam). She exhibits no edema.  Lymphadenopathy:    She has no cervical adenopathy.  Neurological: She is alert and oriented to person, place, and time. Coordination normal.  Skin: Skin is warm and dry. No rash noted. She is not diaphoretic.  Psychiatric: She has a normal mood and affect. Her behavior is normal.  Nursing note and vitals reviewed.  Rapid flu: Negative    Assessment & Plan:   Problem List Items Addressed This Visit    None    Visit Diagnoses    Tick bite, initial encounter    -  Primary   Relevant Medications   doxycycline (VIBRA-TABS) 100 MG tablet   Other Relevant Orders   Lyme Ab/Western Blot Reflex   Rocky mtn spotted fvr abs pnl(IgG+IgM)   Alpha-Gal Panel   CBC with Differential/Platelet   CMP14+EGFR   Body aches       Relevant Medications   doxycycline (VIBRA-TABS) 100 MG tablet   Other Relevant Orders   Veritor Flu A/B Waived    Lyme Ab/Western Blot Reflex   Rocky mtn spotted fvr abs pnl(IgG+IgM)   Alpha-Gal Panel   CBC with Differential/Platelet   CMP14+EGFR       Follow up plan: Return if symptoms worsen or fail to improve.  Counseling provided for all of the vaccine components Orders Placed This Encounter  Procedures  . Veritor Flu A/B Waived  . Lyme Ab/Western Blot Reflex  . Rocky mtn spotted fvr abs pnl(IgG+IgM)  . Alpha-Gal Panel  . CBC with Differential/Platelet  . [place] Dettinger, MD [facility] 06/17/2016, 9:51 AM

## 2016-06-19 ENCOUNTER — Other Ambulatory Visit: Payer: BLUE CROSS/BLUE SHIELD

## 2016-06-19 DIAGNOSIS — R799 Abnormal finding of blood chemistry, unspecified: Secondary | ICD-10-CM

## 2016-06-19 LAB — VERITOR FLU A/B WAIVED
Influenza A: NEGATIVE
Influenza B: NEGATIVE

## 2016-06-20 LAB — CBC WITH DIFFERENTIAL/PLATELET
Basophils Absolute: 0 10*3/uL (ref 0.0–0.2)
Basos: 1 %
EOS (ABSOLUTE): 0.1 10*3/uL (ref 0.0–0.4)
Eos: 3 %
Hematocrit: 39.4 % (ref 34.0–46.6)
Hemoglobin: 12.6 g/dL (ref 11.1–15.9)
Immature Grans (Abs): 0 10*3/uL (ref 0.0–0.1)
Immature Granulocytes: 0 %
Lymphocytes Absolute: 1 10*3/uL (ref 0.7–3.1)
Lymphs: 40 %
MCH: 27.3 pg (ref 26.6–33.0)
MCHC: 32 g/dL (ref 31.5–35.7)
MCV: 86 fL (ref 79–97)
Monocytes Absolute: 0.3 10*3/uL (ref 0.1–0.9)
Monocytes: 10 %
Neutrophils Absolute: 1.1 10*3/uL — ABNORMAL LOW (ref 1.4–7.0)
Neutrophils: 46 %
Platelets: 247 10*3/uL (ref 150–379)
RBC: 4.61 x10E6/uL (ref 3.77–5.28)
RDW: 15.1 % (ref 12.3–15.4)
WBC: 2.5 10*3/uL — CL (ref 3.4–10.8)

## 2016-06-20 LAB — CMP14+EGFR
ALT: 23 IU/L (ref 0–32)
AST: 19 IU/L (ref 0–40)
Albumin/Globulin Ratio: 1.6 (ref 1.2–2.2)
Albumin: 3.8 g/dL (ref 3.6–4.8)
Alkaline Phosphatase: 73 IU/L (ref 39–117)
BUN/Creatinine Ratio: 15 (ref 12–28)
BUN: 15 mg/dL (ref 8–27)
Bilirubin Total: 0.3 mg/dL (ref 0.0–1.2)
CO2: 25 mmol/L (ref 18–29)
Calcium: 9.3 mg/dL (ref 8.7–10.3)
Chloride: 103 mmol/L (ref 96–106)
Creatinine, Ser: 0.98 mg/dL (ref 0.57–1.00)
GFR calc Af Amer: 71 mL/min/{1.73_m2} (ref >59)
GFR calc non Af Amer: 61 mL/min/{1.73_m2} (ref >59)
Globulin, Total: 2.4 g/dL (ref 1.5–4.5)
Glucose: 94 mg/dL (ref 65–99)
Potassium: 4.1 mmol/L (ref 3.5–5.2)
Sodium: 140 mmol/L (ref 134–144)
Total Protein: 6.2 g/dL (ref 6.0–8.5)

## 2016-06-20 LAB — LYME AB/WESTERN BLOT REFLEX
LYME DISEASE AB, QUANT, IGM: <0.8 index (ref 0.00–0.79)
Lyme IgG/IgM Ab: <0.91 {ISR} (ref 0.00–0.90)

## 2016-06-20 LAB — ALPHA-GAL PANEL
Alpha Gal IgE*: 29 kU/L — ABNORMAL HIGH (ref <0.35)
Beef (Bos spp) IgE: 3.13 kU/L — ABNORMAL HIGH (ref <0.35)
Class Interpretation: 2
Class Interpretation: 2
Lamb/Mutton (Ovis spp) IgE: 0.33 kU/L (ref <0.35)
Pork (Sus spp) IgE: 1.03 kU/L — ABNORMAL HIGH (ref <0.35)

## 2016-06-20 LAB — RMSF, IGG, IFA: RMSF, IGG, IFA: <1:64 {titer}

## 2016-06-20 LAB — ROCKY MTN SPOTTED FVR ABS PNL(IGG+IGM)
RMSF IgG: POSITIVE — AB
RMSF IgM: 0.84 index (ref 0.00–0.89)

## 2016-06-21 ENCOUNTER — Telehealth: Payer: Self-pay | Admitting: Family Medicine

## 2016-06-21 NOTE — Telephone Encounter (Signed)
Pt aware of MD feedback and voiced understanding. 

## 2016-06-21 NOTE — Telephone Encounter (Signed)
The main 2 medications over-the-counter that he can take are melatonin and Benadryl, she can try either of those but if she wants to try something prescription and she will need to come back and discuss in visit.

## 2016-06-21 NOTE — Telephone Encounter (Signed)
Pt aware of results & recommendations  She is having a lot of pain especially hips and down at night. She is taking tylenol which is not helping much as well as this bothers her stomach is there anything else OTC that she can take so that she can sleep

## 2016-07-10 ENCOUNTER — Ambulatory Visit: Payer: BLUE CROSS/BLUE SHIELD | Admitting: Physical Therapy

## 2016-08-07 ENCOUNTER — Ambulatory Visit (INDEPENDENT_AMBULATORY_CARE_PROVIDER_SITE_OTHER): Payer: BLUE CROSS/BLUE SHIELD | Admitting: Family Medicine

## 2016-08-07 ENCOUNTER — Encounter: Payer: Self-pay | Admitting: Family Medicine

## 2016-08-07 VITALS — BP 131/84 | HR 81 | Temp 95.9°F | Ht 63.0 in | Wt 184.2 lb

## 2016-08-07 DIAGNOSIS — L509 Urticaria, unspecified: Secondary | ICD-10-CM | POA: Diagnosis not present

## 2016-08-07 DIAGNOSIS — D171 Benign lipomatous neoplasm of skin and subcutaneous tissue of trunk: Secondary | ICD-10-CM | POA: Diagnosis not present

## 2016-08-07 MED ORDER — METHYLPREDNISOLONE ACETATE 80 MG/ML IJ SUSP
80.0000 mg | Freq: Once | INTRAMUSCULAR | Status: AC
  Administered 2016-08-07: 80 mg via INTRAMUSCULAR

## 2016-08-07 MED ORDER — EPINEPHRINE 0.3 MG/0.3ML IJ SOAJ: 0.3000 mg | Freq: Once | INTRAMUSCULAR | 1 refills | Status: AC

## 2016-08-07 MED ORDER — OLMESARTAN MEDOXOMIL-HCTZ 20-12.5 MG PO TABS: 1.0000 | ORAL_TABLET | Freq: Every day | ORAL | 3 refills | Status: DC

## 2016-08-07 NOTE — Addendum Note (Signed)
Addended by: Karle Plumber on: 08/07/2016 02:14 PM   Modules accepted: Orders

## 2016-08-07 NOTE — Patient Instructions (Addendum)
Great to meet you!  Start daily zyrtec and twice daily over the counter pepcid ac ( for heartburn)  We are working on referrals for you.   Come back or call with any concerns

## 2016-08-07 NOTE — Progress Notes (Signed)
   HPI  Patient presents today here with rash.  Patient She's had this rash for about 2 weeks off and on, lasts about 15 minutes and has happened on nearly a daily basis. There is no predictable time, it has happened immediately after eating chocolate once but then not again.  She was recently diagnosed with RMSF and treated with doxycycline, later with be from pork allergy.  She also has new lump on the right flank, she would like this investigated fully.  She denies any fever, chills, sweats.  During the rash episodes which are described as whelps she's having some throat and chest tightening and feeling of "choking".  PMH: Smoking status noted ROS: Per HPI  Objective: BP 131/84   Pulse 81   Temp (!) 95.9 F (35.5 C) (Oral)   Ht 5\' 3"  (1.6 m)   Wt 184 lb 3.2 oz (83.6 kg)   BMI 32.63 kg/m  Gen: NAD, alert, cooperative with exam HEENT: NCAT CV: RRR, good S1/S2, no murmur Resp: CTABL, no wheezes, non-labored Ext: No edema, warm Neuro: Alert and oriented, No gross deficits Skin:  Right flank with approximately 3 cm mobile football shaped soft subcutaneous nodule No rash apparent  Assessment and plan:  # Urticaria New problem With recent findings of the support allergy Unclear etiology Recommended daily Zyrtec plus Pepcid twice a day. Given IM Depo-Medrol today Allergy referral, continue to avoid beef and pork.  # Lipoma New problem Right flank, no red flags Refer to dermatology per her preference    Orders Placed This Encounter  Procedures  . Ambulatory referral to Dermatology    Referral Priority:   Routine    Referral Type:   Consultation    Referral Reason:   Specialty Services Required    Requested Specialty:   Dermatology    Number of Visits Requested:   1  . Ambulatory referral to Allergy    Referral Priority:   Routine    Referral Type:   Allergy Testing    Referral Reason:   Specialty Services Required    Requested Specialty:   Allergy   Number of Visits Requested:   1    Meds ordered this encounter  Medications  . olmesartan-hydrochlorothiazide (BENICAR HCT) 20-12.5 MG tablet    Sig: Take 1 tablet by mouth daily.    Dispense:  90 tablet    Refill:  3  . EPINEPHrine (ADRENACLICK) 0.3 OJ/5.0 mL IJ SOAJ injection    Sig: Inject 0.3 mLs (0.3 mg total) into the muscle once.    Dispense:  1 Device    Refill:  [place], MD [facility] 08/07/2016, 2:06 PM

## 2016-08-08 ENCOUNTER — Telehealth: Payer: Self-pay

## 2016-08-08 NOTE — Telephone Encounter (Signed)
Use EpiPen and use as directed for allergic reaction

## 2016-08-08 NOTE — Telephone Encounter (Signed)
INsurance denied prior auth for Epinephrine 0.3 mg   Preferred are Epinephrine generic or Brand EPIPEN

## 2016-08-14 ENCOUNTER — Ambulatory Visit (INDEPENDENT_AMBULATORY_CARE_PROVIDER_SITE_OTHER): Payer: BLUE CROSS/BLUE SHIELD | Admitting: Family Medicine

## 2016-08-14 ENCOUNTER — Encounter: Payer: Self-pay | Admitting: Family Medicine

## 2016-08-14 VITALS — BP 105/73 | HR 84 | Temp 98.3°F | Ht 63.0 in | Wt 182.0 lb

## 2016-08-14 DIAGNOSIS — I10 Essential (primary) hypertension: Secondary | ICD-10-CM

## 2016-08-14 DIAGNOSIS — K2100 Gastro-esophageal reflux disease with esophagitis, without bleeding: Secondary | ICD-10-CM

## 2016-08-14 DIAGNOSIS — L509 Urticaria, unspecified: Secondary | ICD-10-CM

## 2016-08-14 DIAGNOSIS — D171 Benign lipomatous neoplasm of skin and subcutaneous tissue of trunk: Secondary | ICD-10-CM | POA: Diagnosis not present

## 2016-08-14 DIAGNOSIS — E7800 Pure hypercholesterolemia, unspecified: Secondary | ICD-10-CM

## 2016-08-14 DIAGNOSIS — E559 Vitamin D deficiency, unspecified: Secondary | ICD-10-CM

## 2016-08-14 NOTE — Patient Instructions (Addendum)
  Continue current medications. Continue good therapeutic lifestyle changes which include good diet and exercise. Fall precautions discussed with patient. If an FOBT was given today- please return it to our front desk. If you are over 65 years old - you may need Prevnar 79 or the adult Pneumonia vaccine.  **Flu shots are available--- please call and schedule a FLU-CLINIC appointment**  After your visit with [place] today you will receive a survey in the mail or online from [employer] regarding your care with [place]. Please take a moment to fill this out. Your feedback is very important to [place] as you can help [place] better understand your patient needs as well as improve your experience and satisfaction. WE CARE ABOUT YOU!!!   Use soaps fabric softeners and detergents that are scent free Do not take hot baths only cool baths Follow-up with dermatology and allergy as planned The knot on your right side in the back area is a lipoma and this should not be anything to worry about Do not forget to get your mammogram and DEXA scan We will call with lab work results as soon as they become available

## 2016-08-14 NOTE — Progress Notes (Signed)
Subjective:    Patient ID: Leah [name], female    DOB: 25-Oct-1950, 66 y.o.   MRN: [id number]  HPI Pt here for follow up and management of chronic medical problems which includes hyperlipidemia and hypertension. She is taking medication regularly.The patient is concerned about a knot on her right rib area has been then told this was a lipoma. She also has a rash and she's been referred to the allergist and the dermatologist because of the rash. Her mammogram is due and she is going to wait on the DEXA scan until she gets her Medicare in place. She will get a traditional lab work today. The patient did have some type of tick bite and took 2 rounds of antibiotics. After the second round of antibiotics aching and myalgias got better. She has had over the past couple of weeks more problems with itching in the palms and the feet or hands and actually breaking out with welts and this may last for as long as 15 minutes and then resolves. She has not changed soaps fabric softeners or detergents. I reminded her to stay away from anything that is scented. She denies any chest pain or shortness of breath anymore than usual. She also complains of some black and blue spots on her body at times. She has had an episode of nausea but in general does not have nausea vomiting diarrhea or any changes in her bowel habits. She is passing her water without problems. She was encouraged to use scent free fabric softener soaps and detergents. She also has an EpiPen at home. She has plans to see the dermatologist and allergy specialist.     Patient Active Problem List   Diagnosis Date Noted  . Adjustment disorder with mixed anxiety and depressed mood 03/25/2013  . Abdominal pain, chronic, epigastric 11/12/2012  . Nausea alone 11/12/2012  . Personal history of colonic polyps 11/12/2012  . Hypertension, benign essential, goal below 140/90 10/30/2012  . Hyperlipidemia 10/22/2012  . GERD (gastroesophageal reflux disease)  10/22/2012  . Unspecified asthma(493.90) 10/22/2012   Outpatient Encounter Prescriptions as of 08/14/2016  Medication Sig  . albuterol (PROVENTIL HFA;VENTOLIN HFA) 108 (90 Base) MCG/ACT inhaler Inhale 2 puffs into the lungs every 6 (six) hours as needed for wheezing or shortness of breath.  Marland Kitchen albuterol (PROVENTIL) (2.5 MG/3ML) 0.083% nebulizer solution Take 3 mLs (2.5 mg total) by nebulization every 6 (six) hours as needed for wheezing or shortness of breath.  Marland Kitchen aspirin 81 MG tablet Take 81 mg by mouth daily.  . calcium carbonate (TUMS) 500 MG chewable tablet Chew 1 tablet (200 mg of elemental calcium total) by mouth as needed for indigestion or heartburn.  . Cholecalciferol (VITAMIN D) 2000 UNITS tablet Take 2,000 Units by mouth daily.  . Coenzyme Q10 (CO Q 10 PO) Take by mouth.  . fish oil-omega-3 fatty acids 1000 MG capsule Take 1 g by mouth daily.   Marland Kitchen olmesartan-hydrochlorothiazide (BENICAR HCT) 20-12.5 MG tablet Take 1 tablet by mouth daily.  Marland Kitchen omeprazole (PRILOSEC) 40 MG capsule Take 1 capsule (40 mg total) by mouth daily.  . [DISCONTINUED] gi cocktail (Maalox,Lidocaine,Donnatal)    No facility-administered encounter medications on file as of 08/14/2016.      Review of Systems  Constitutional: Negative.   HENT: Negative.   Eyes: Negative.   Respiratory: Negative.   Cardiovascular: Negative.   Gastrointestinal: Negative.   Endocrine: Negative.   Genitourinary: Negative.   Musculoskeletal: Negative.   Skin: Positive for rash (referred to  DERM and allergist).       Lipoma / knot right rib area  Allergic/Immunologic: Negative.   Neurological: Negative.   Hematological: Negative.   Psychiatric/Behavioral: Negative.        Objective:   Physical Exam  Constitutional: She is oriented to person, place, and time. She appears well-developed and well-nourished. No distress.  The patient is pleasant and alert  HENT:  Head: Normocephalic and atraumatic.  Right Ear: External ear  normal.  Left Ear: External ear normal.  Mouth/Throat: Oropharynx is clear and moist. No oropharyngeal exudate.  Slight nasal congestion and turbinate swelling left greater than right  Eyes: Pupils are equal, round, and reactive to light. Conjunctivae and EOM are normal. Right eye exhibits no discharge. Left eye exhibits no discharge. No scleral icterus.  Neck: Normal range of motion. Neck supple. No thyromegaly present.  No bruits thyromegaly or anterior cervical adenopathy  Cardiovascular: Normal rate, regular rhythm, normal heart sounds and intact distal pulses.   No murmur heard. The heart is regular at 72/m with good pedal pulses  Pulmonary/Chest: Effort normal and breath sounds normal. No respiratory distress. She has no wheezes. She has no rales.  Clear anteriorly and posteriorly  Abdominal: Soft. Bowel sounds are normal. She exhibits no mass. There is no tenderness. There is no rebound and no guarding.  Nontender without masses or organ enlargement or bruits  Musculoskeletal: Normal range of motion. She exhibits no edema.  Lymphadenopathy:    She has no cervical adenopathy.  Neurological: She is alert and oriented to person, place, and time. She has normal reflexes. No cranial nerve deficit.  Skin: Skin is warm and dry. No rash noted. No erythema.  Psychiatric: She has a normal mood and affect. Her behavior is normal. Judgment and thought content normal.  Nursing note and vitals reviewed.  BP 105/73 (BP Location: Left Arm)   Pulse 84   Temp 98.3 F (36.8 C) (Oral)   Ht '5\' 3"'$  (1.6 m)   Wt 182 lb (82.6 kg)   BMI 32.24 kg/m         Assessment & Plan:  1. Vitamin D deficiency -Continue current treatment pending results of lab work - CBC with Differential/Platelet - VITAMIN D 25 Hydroxy (Vit-D Deficiency, Fractures)  2. Gastroesophageal reflux disease with esophagitis -No complaints today with reflux. - CBC with Differential/Platelet - Hepatic function panel  3.  Essential hypertension -Blood pressure is good today she will for now continue with her current treatment knowing that we may have to take her off the Benicar HCT as an option for looking for allergy. - BMP8+EGFR - CBC with Differential/Platelet - Hepatic function panel  4. Pure hypercholesterolemia -Continue with aggressive therapeutic lifestyle changes - CBC with Differential/Platelet - Lipid panel  5. Urticaria -We'll not sure what is causing this. She has been changed from an ace with HCTZ to an arm with HCTZ. She could be sensitive to the arm and to the HCTZ. She was encouraged to take cooler baths and change her soaps fabric softeners and detergents to unscented preparations. -She should keep her appointment with the dermatologist and allergist.  6. Lipoma of torso -Reassurance  No orders of the defined types were placed in this encounter.  Patient Instructions   Continue current medications. Continue good therapeutic lifestyle changes which include good diet and exercise. Fall precautions discussed with patient. If an FOBT was given today- please return it to our front desk. If you are over 40 years old - you may  need Prevnar 13 or the adult Pneumonia vaccine.  **Flu shots are available--- please call and schedule a FLU-CLINIC appointment**  After your visit with [place] today you will receive a survey in the mail or online from [employer] regarding your care with [place]. Please take a moment to fill this out. Your feedback is very important to [place] as you can help [place] better understand your patient needs as well as improve your experience and satisfaction. WE CARE ABOUT YOU!!!   Use soaps fabric softeners and detergents that are scent free Do not take hot baths only cool baths Follow-up with dermatology and allergy as planned The knot on your right side in the back area is a lipoma and this should not be anything to worry about Do not forget to get your mammogram and DEXA scan We  will call with lab work results as soon as they become available    Arrie Senate MD

## 2016-08-15 LAB — CBC WITH DIFFERENTIAL/PLATELET
Basophils Absolute: 0 10*3/uL (ref 0.0–0.2)
Basos: 1 %
EOS (ABSOLUTE): 0.1 10*3/uL (ref 0.0–0.4)
Eos: 2 %
Hematocrit: 37.5 % (ref 34.0–46.6)
Hemoglobin: 12.7 g/dL (ref 11.1–15.9)
Immature Grans (Abs): 0 10*3/uL (ref 0.0–0.1)
Immature Granulocytes: 0 %
Lymphocytes Absolute: 1.5 10*3/uL (ref 0.7–3.1)
Lymphs: 43 %
MCH: 28.2 pg (ref 26.6–33.0)
MCHC: 33.9 g/dL (ref 31.5–35.7)
MCV: 83 fL (ref 79–97)
Monocytes Absolute: 0.2 10*3/uL (ref 0.1–0.9)
Monocytes: 6 %
Neutrophils Absolute: 1.6 10*3/uL (ref 1.4–7.0)
Neutrophils: 48 %
Platelets: 293 10*3/uL (ref 150–379)
RBC: 4.51 x10E6/uL (ref 3.77–5.28)
RDW: 14.6 % (ref 12.3–15.4)
WBC: 3.3 10*3/uL — ABNORMAL LOW (ref 3.4–10.8)

## 2016-08-15 LAB — BMP8+EGFR
BUN/Creatinine Ratio: 19 (ref 12–28)
BUN: 16 mg/dL (ref 8–27)
CO2: 26 mmol/L (ref 20–29)
Calcium: 9.3 mg/dL (ref 8.7–10.3)
Chloride: 104 mmol/L (ref 96–106)
Creatinine, Ser: 0.85 mg/dL (ref 0.57–1.00)
GFR calc Af Amer: 84 mL/min/{1.73_m2} (ref >59)
GFR calc non Af Amer: 73 mL/min/{1.73_m2} (ref >59)
Glucose: 102 mg/dL — ABNORMAL HIGH (ref 65–99)
Potassium: 4.1 mmol/L (ref 3.5–5.2)
Sodium: 145 mmol/L — ABNORMAL HIGH (ref 134–144)

## 2016-08-15 LAB — HEPATIC FUNCTION PANEL
ALT: 16 IU/L (ref 0–32)
AST: 19 IU/L (ref 0–40)
Albumin: 4.3 g/dL (ref 3.6–4.8)
Alkaline Phosphatase: 78 IU/L (ref 39–117)
Bilirubin Total: 0.2 mg/dL (ref 0.0–1.2)
Bilirubin, Direct: 0.07 mg/dL (ref 0.00–0.40)
Total Protein: 6.3 g/dL (ref 6.0–8.5)

## 2016-08-15 LAB — LIPID PANEL
Chol/HDL Ratio: 5.2 ratio — ABNORMAL HIGH (ref 0.0–4.4)
Cholesterol, Total: 295 mg/dL — ABNORMAL HIGH (ref 100–199)
HDL: 57 mg/dL (ref >39)
LDL Calculated: 210 mg/dL — ABNORMAL HIGH (ref 0–99)
Triglycerides: 140 mg/dL (ref 0–149)
VLDL Cholesterol Cal: 28 mg/dL (ref 5–40)

## 2016-08-15 LAB — VITAMIN D 25 HYDROXY (VIT D DEFICIENCY, FRACTURES): Vit D, 25-Hydroxy: 40.5 ng/mL (ref 30.0–100.0)

## 2016-08-21 ENCOUNTER — Telehealth: Payer: Self-pay | Admitting: *Deleted

## 2016-08-22 NOTE — Telephone Encounter (Signed)
Good plan to reduce blood pressure medicine to one half and check readings at home and get back in touch with [place] with additional readings and how she is feeling

## 2016-08-29 ENCOUNTER — Ambulatory Visit (INDEPENDENT_AMBULATORY_CARE_PROVIDER_SITE_OTHER): Payer: BLUE CROSS/BLUE SHIELD | Admitting: Pharmacist

## 2016-08-29 ENCOUNTER — Encounter: Payer: Self-pay | Admitting: Pharmacist

## 2016-08-29 VITALS — BP 118/72 | HR 52 | Ht 63.0 in | Wt 186.0 lb

## 2016-08-29 DIAGNOSIS — Z1231 Encounter for screening mammogram for malignant neoplasm of breast: Secondary | ICD-10-CM

## 2016-08-29 DIAGNOSIS — Z1239 Encounter for other screening for malignant neoplasm of breast: Secondary | ICD-10-CM

## 2016-08-29 DIAGNOSIS — E7800 Pure hypercholesterolemia, unspecified: Secondary | ICD-10-CM

## 2016-08-29 MED ORDER — PRAVASTATIN SODIUM 40 MG PO TABS: 40.0000 mg | ORAL_TABLET | Freq: Every day | ORAL | 2 refills | Status: DC

## 2016-08-29 NOTE — Patient Instructions (Signed)
Start pravastatin 40mg  take 1 tablet a day.  If you have any muscle pain stop and call office.  If not able to take pravastatin then can use generic Zetia 10mg .

## 2016-08-29 NOTE — Progress Notes (Signed)
Patient ID: Leah [name], female   DOB: 1950-07-29, 66 y.o.   MRN: [id number]   Subjective:    Leah [name] is a 66 y.o. female who presents for evaluation of dyslipidemia. Leah [name] has tried several statins in past.    Atorvastatin 40mg  in 2015 - caused myalgias  Livalo 2mg  took in 2014 - stopped due to cost and she felt had increased BP  Rosuvastatin 10mg  and 20mg  tried on different occasions.  Both times patient experienced myalgias.  Patient also had recently been switched from lisinopril HCTZ 20/12.5mg  to olmasartan HCTZ 20/12.5mg  qd.  Last week she experienced hypotension and dose was decreased to 1/2 tablet qd.  She is not longer having s/s of hypoglycemia and home BG readings have been in 120's/70's  Exercise: exercise class at [facility] 4 days per week. Previous history of cardiac disease includes: None. Diet - patient has been eating less animal products since positive Alpha Gal Antibody   Cardiac Risk Estimate:  AHA risk assessment = 7.5% - statin recommended  The following portions of the patient's history were reviewed and updated as appropriate: allergies, current medications, past family history, past medical history, past social history, past surgical history and problem list.    Objective:      Today's Vitals   08/29/16 1507  BP: 118/72  Pulse: (!) 52  Weight: 186 lb (84.4 kg)  Height: 5\' 3"  (1.6 m)    Lab Review Lab Results  Component Value Date   CHOL 295 (H) 08/14/2016   CHOL 164 02/17/2016   CHOL 313 (H) 02/17/2015   CHOL 314 (H) 02/10/2014   CHOL 276 (H) 05/21/2013   CHOL 209 (H) 01/22/2013   TRIG 140 08/14/2016   TRIG 79 02/17/2016   TRIG 98 02/17/2015   TRIG 202 (H) 02/10/2014   HDL 57 08/14/2016   HDL 53 02/17/2016   HDL 56 02/17/2015   HDL 53 02/10/2014      Assessment:    Dyslipidemia as detailed above with 7.5% CHD risk estimate  Plan:   1. Dietary changes: continue to limit high fat foods / animal products.  Increase fiber,  fruits and vegetablet 2. Exercise changes:  continue to exericse at [facility] 150 minutes per week 3. Lipid-lowering medications:  4.  RTC as planned in 3 months to see PCP and recheck lipids.  5.  If patient has myalgias with pravastatin then trial of generic Zetia next. 6.  Order for mammogram sent

## 2016-09-20 ENCOUNTER — Encounter: Payer: Self-pay | Admitting: Allergy and Immunology

## 2016-09-20 ENCOUNTER — Ambulatory Visit (INDEPENDENT_AMBULATORY_CARE_PROVIDER_SITE_OTHER): Payer: Medicare Other | Admitting: Allergy and Immunology

## 2016-09-20 VITALS — BP 134/78 | HR 80 | Temp 98.0°F | Resp 16 | Ht 64.0 in | Wt 180.0 lb

## 2016-09-20 DIAGNOSIS — L500 Allergic urticaria: Secondary | ICD-10-CM | POA: Diagnosis not present

## 2016-09-20 DIAGNOSIS — K219 Gastro-esophageal reflux disease without esophagitis: Secondary | ICD-10-CM | POA: Diagnosis not present

## 2016-09-20 DIAGNOSIS — Z91018 Allergy to other foods: Secondary | ICD-10-CM | POA: Diagnosis not present

## 2016-09-20 DIAGNOSIS — M791 Myalgia, unspecified site: Secondary | ICD-10-CM

## 2016-09-20 DIAGNOSIS — J4530 Mild persistent asthma, uncomplicated: Secondary | ICD-10-CM | POA: Diagnosis not present

## 2016-09-20 MED ORDER — MONTELUKAST SODIUM 10 MG PO TABS: 10.0000 mg | ORAL_TABLET | Freq: Every day | ORAL | 5 refills | Status: DC

## 2016-09-20 NOTE — Patient Instructions (Addendum)
  1. Allergen avoidance measures  2. Every day use the following preventative medications:   A. loratadine 10 mg tablet twice a day  B. ranitidine 150 mg tablet twice a day  C. montelukast 10 mg tablet once a day  3. If needed:   A. albuterol MDI 2 inhalations or albuterol nebulization every 4-6 hours  B. EpiPen, Benadryl, M.D./ER evaluation for allergic reaction  4. Blood: ANA w/reflex, CCP, RF, SED, CRP, aldolase, strawberry IgE  5. Consider discontinuing pravastatin and Fish oil  6. Return to clinic in 4 weeks or earlier if problem  7. Obtain fall flu vaccine

## 2016-09-20 NOTE — Progress Notes (Signed)
Dear Dr. Wendi Snipes,  Thank you for referring Leah [name] to the [facility] on 09/20/2016.   Below is a summation of this patient's evaluation and recommendations.  Thank you for your referral. I will keep you informed about this patient's response to treatment.   If you have any questions please do not hesitate to contact me.   Sincerely,  Jiles Prows, MD Allergy / Immunology [place]   ______________________________________________________________________    NEW PATIENT NOTE  Referring Provider: Timmothy Euler, MD Primary Provider: Chipper Herb, MD Date of office visit: 09/20/2016    Subjective:   Chief Complaint:  Leah [name] (DOB: [birth year]) is a 66 y.o. female who presents to the clinic on 09/20/2016 with a chief complaint of Urticaria and Pruritis .     HPI: Leah [name] presents to this office in evaluation of a multitude of different issues.  First, she has been developing recurrent red raised itchy lesions associated with hand and feet itching that last approximately 15-30 minutes per episode and never heal with scar or hyperpigmentation and are not associated with any systemic or constitutional symptoms over the course of the past 3 months. Initially these would occur every day but her frequency is now approximately 3 times per week. She sometimes does develop this fullness in her left neck with some of these episodes and it almost gives rise to some choking. She has apparently had removal of stone from her salivary gland in the past and she recently saw Dr. Wilburn Cornelia in evaluation of this issue with a investigation that included a negative CT scan of her neck. There is no obvious provoking factor giving rise to this cutaneous abnormality.  Second, apparently she has been diagnosed with alpha gal syndrome. She has never had any type of adverse effect eating a  mammal but a blood test obtained in investigation of alpha gal at the same time that she was being evaluated for [facility] spotted fever identified high levels of IgE antibodies directed against both beef and pork. She has been without mammal consumption over the course of the past 2 months. She does have a history of developing itchy throat and hives when she eats strawberries. Her diagnosis of [facility] spotted fever is hard to decipher. It sounds as though she may of been diagnosed with this condition because of a diffuse muscle ache issue as described below. She was given doxycycline during spring of this year.  Third, she has been diagnosed with asthma for many years. Her requirement for a short acting bronchodilator is less than 1 time per month. Her asthma is manifested as acute onset of coughing and wheezing but usually occurs at nighttime and can awaken her from sleep. She also has a history of intermittent raspy voice and throat clearing. She is a Primary school teacher. She has a history of reflux disease that required a Nissen fundoplication about 20 years ago and she rarely uses Prilosec averaging out to less than 1 time every few months for episodes of heartburn.  Fourth, she has been having pain of her muscles involving her lower extremities up to her abdomen. She has had muscle weakness involving her hands and her legs. She can no longer use a weed Mare Ferrari because she doesn't have the strength to hold onto this machine and she can't start up the machine with a rope pull. Apparently she may of been having  muscle pain precipitated by the use of her statin and it sounds as though she had her statin removed for several months which may have helped her muscle pain and weakness but because of a cholesterol issue she was placed back on pravastatin sometime this past month or so.  Past Medical History:  Diagnosis Date  . Asthma   . Colon polyp   . Depression   . GERD (gastroesophageal reflux disease)     . Hyperlipidemia   . Hypertension   . Urticaria     Past Surgical History:  Procedure Laterality Date  . ABDOMINAL HYSTERECTOMY    . Carpel Tunnel    . CHOLECYSTECTOMY    . COLONOSCOPY  02/08/2005   [id number] hemorrhoids otherwise normal rectum/ A few scattered, shallow, left-sided sigmoid diverticula/The remainder of the colonic mucosa appeared normal  . COLONOSCOPY WITH ESOPHAGOGASTRODUODENOSCOPY (EGD) N/A 11/29/2012   Procedure: COLONOSCOPY WITH ESOPHAGOGASTRODUODENOSCOPY (EGD);  Surgeon: Daneil Dolin, MD;  [facility];  Service: Endoscopy;  Laterality: N/A;  10:45  . HERNIA REPAIR     ventral X 2 with mesh, last one 2011 ([place]). complicated with 2 week hospital stay  . NISSEN FUNDOPLICATION  3220  . TUBAL LIGATION      Allergies as of 09/20/2016      Reactions   Codeine Nausea And Vomiting   Penicillins Other (See Comments)   Does not know      Medication List      albuterol (2.5 MG/3ML) 0.083% nebulizer solution Commonly known as:  PROVENTIL Take 3 mLs (2.5 mg total) by nebulization every 6 (six) hours as needed for wheezing or shortness of breath.   albuterol 108 (90 Base) MCG/ACT inhaler Commonly known as:  PROVENTIL HFA;VENTOLIN HFA Inhale 2 puffs into the lungs every 6 (six) hours as needed for wheezing or shortness of breath.   aspirin 81 MG tablet Take 81 mg by mouth daily.   calcium carbonate 500 MG chewable tablet Commonly known as:  TUMS Chew 1 tablet (200 mg of elemental calcium total) by mouth as needed for indigestion or heartburn.   CO Q 10 PO Take 1 tablet by mouth.   EPIPEN 2-PAK 0.3 mg/0.3 mL Soaj injection Generic drug:  EPINEPHrine Inject into the muscle once.   fish oil-omega-3 fatty acids 1000 MG capsule Take 1 g by mouth daily.   olmesartan-hydrochlorothiazide 20-12.5 MG tablet Commonly known as:  BENICAR HCT Take 1 tablet by mouth daily.   omeprazole 40 MG capsule Commonly known as:  PRILOSEC Take 1  capsule (40 mg total) by mouth daily.   pravastatin 40 MG tablet Commonly known as:  PRAVACHOL Take 1 tablet (40 mg total) by mouth daily.   Vitamin D 2000 units tablet Take 2,000 Units by mouth daily.       Review of systems negative except as noted in HPI / PMHx or noted below:  Review of Systems  Constitutional: Negative.   HENT: Negative.   Eyes: Negative.   Respiratory: Negative.   Cardiovascular: Negative.   Gastrointestinal: Negative.   Genitourinary: Negative.   Musculoskeletal: Negative.   Skin: Negative.   Neurological: Negative.   Endo/Heme/Allergies: Negative.   Psychiatric/Behavioral: Negative.     Family History  Problem Relation Age of Onset  . Heart disease Mother   . Alzheimer's disease Mother   . Diabetes Mother   . Other Father        deceased age 4 from some sort of GI problems but no malignancy  .  Diabetes Sister   . Diabetes Brother   . Cancer Cousin        pancreatic  . Colon cancer Neg Hx     Social History   Social History  . Marital status: Divorced    Spouse name: N/A  . Number of children: 3  . Years of education: N/A   Occupational History  . retired    Social History Main Topics  . Smoking status: Former Smoker    Packs/day: 0.50    Years: 28.00    Types: Cigarettes    Quit date: 01/17/1996  . Smokeless tobacco: Never Used  . Alcohol use 0.0 oz/week     Comment: occasional use of wine   . Drug use: No  . Sexual activity: Not on file   Other Topics Concern  . Not on file   Social History Narrative  . No narrative on file    Environmental and Social history  Lives in a house with a dry environment, dogs located inside the household, no carpeting in the bedroom, plastic on the bed, no plastic on the pillow, and no smokers located inside the household.  Objective:   Vitals:   09/20/16 0847  BP: 134/78  Pulse: 80  Resp: 16  Temp: 98 F (36.7 C)   Height: 5\' 4"  (162.6 cm) Weight: 180 lb (81.6  kg)  Physical Exam  Constitutional: She is well-developed, well-nourished, and in no distress.  Intermittent cough, raspy voice  HENT:  Head: Normocephalic. Head is without right periorbital erythema and without left periorbital erythema.  Right Ear: Tympanic membrane, external ear and ear canal normal.  Left Ear: Tympanic membrane, external ear and ear canal normal.  Nose: Nose normal. No mucosal edema or rhinorrhea.  Mouth/Throat: Oropharynx is clear and moist and mucous membranes are normal. No oropharyngeal exudate.  Eyes: Pupils are equal, round, and reactive to light. Conjunctivae and lids are normal.  Neck: Trachea normal. No tracheal deviation present. No thyromegaly present.  Cardiovascular: Normal rate, regular rhythm, S1 normal, S2 normal and normal heart sounds.   No murmur heard. Pulmonary/Chest: Effort normal. No stridor. No tachypnea. No respiratory distress. She has no wheezes. She has no rales. She exhibits no tenderness.  Abdominal: Soft. She exhibits no distension and no mass. There is no hepatosplenomegaly. There is no tenderness. There is no rebound and no guarding.  Musculoskeletal: She exhibits no edema or tenderness.  Lymphadenopathy:       Head (right side): No tonsillar adenopathy present.       Head (left side): No tonsillar adenopathy present.    She has no cervical adenopathy.    She has no axillary adenopathy.  Neurological: She is alert. Gait normal.  Skin: No rash noted. She is not diaphoretic. No erythema. No pallor. Nails show no clubbing.  Psychiatric: Mood and affect normal.    Diagnostics: Allergy skin tests were performed. She demonstrated hypersensitivity to mold, cat, dog, house dust mite. She did not demonstrate any hypersensitivity against foods.  Spirometry was performed and demonstrated an FEV1 of 2.21 @ 110 % of predicted. Following the administration of nebulized albuterol her FEV1 rose to 2.42 which was an increase in the FEV1 of  10%.  The patient had an Asthma Control Test with the following results:  .    Results of blood tests obtained 08/14/2016 identifies normal hepatic and renal function, white blood cell count 3.3 with an absolute eosinophil count of 100, absolute lymphocyte count of 1500, hemoglobin 12.7, platelet  293.  Results of blood tests obtained 06/17/2016 identified a alpha gal IgE titer of 29 KU/L with IgE directed against beef at 3.13 KU/L and pork 1.03 KU/L.  Results of a soft tissue neck CT scan obtained 21 and August 2017 identified the following:  Pharynx and larynx: The larynx appears symmetric and within normal limits. There is mild asymmetry of the piriform sinuses which might be related to retained secretions (series 2, image 33). Oropharynx and nasopharynx are within normal limits. Negative parapharyngeal and retropharyngeal spaces.  Salivary glands: The sublingual space appears normal. A skin marker overlies the left submandibular gland as seen on series 2, image 36. There is no hyper enhancement of the left gland. It does appear minimally larger than the right submandibular gland, but otherwise normal. No sialolithiasis or asymmetric ductal enlargement identified. No surrounding inflammatory changes. No regional soft tissue mass.  The parotid glands appear symmetric and within normal limits.  Thyroid: Multiple hypodense thyroid nodules, the largest is 13 mm in the left lobe, and does not meet consensus criteria for ultrasound follow-up.  Lymph nodes: No cervical lymphadenopathy. Diminutive level 1 and level 2 lymph nodes.  Vascular: Major vascular structures in the neck and at the skullbase are patent. Mild soft and calcified carotid bifurcation atherosclerosis.  Limited intracranial: Negative.  Visualized orbits: Negative.  Mastoids and visualized paranasal sinuses: Clear.  Skeleton:  No acute osseous abnormality identified.  Upper chest: Negative visualized  mediastinum. Negative lung parenchyma aside from mild atelectasis. No axillary lymphadenopathy.  Results of a chest x-ray obtained 02/16/2016 identified the following:  The cardiomediastinal contours are normal. Previous left perihilar and right infrahilar interstitial prominence has resolved. Minimal residual scarring at the right lung base. Pulmonary vasculature is normal. No consolidation, pleural effusion, or pneumothorax. No acute osseous abnormalities are seen.   Assessment and Plan:    1. Allergic urticaria   2. Food allergy   3. Asthma, well controlled, mild persistent   4. LPRD (laryngopharyngeal reflux disease)   5. Muscle pain     1. Allergen avoidance measures  2. Every day use the following preventative medications:   A. loratadine 10 mg tablet twice a day  B. ranitidine 150 mg tablet twice a day  C. montelukast 10 mg tablet once a day  3. If needed:   A. albuterol MDI 2 inhalations or albuterol nebulization every 4-6 hours  B. EpiPen, Benadryl, M.D./ER evaluation for allergic reaction  4. Blood: ANA w/reflex, CCP, RF, SED, CRP, aldolase, strawberry IgE  5. Consider discontinuing pravastatin and Fish oil  6. Return to clinic in 4 weeks or earlier if problem  7. 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 I would like for her to use a combination of an H1 and H2 receptor blocker and a leukotriene modifier while she performs allergen avoidance measures directed against various aeroallergens and remains away from all mammal at this point. She also appears to have muscle pain and weakness. This is worse when using a statin but even in the face of eliminating statin use she still has muscle pain. I think it would be best for her to discontinue all statins and her fish oil and I'm going to obtain a few other blood tests in investigation of a systemic disease that may be contributing  to this pain and weakness. I will regroup with her in 4 weeks or earlier if there is a problem.  Jiles Prows, MD  Allergy / Immunology [facility]

## 2016-09-21 ENCOUNTER — Encounter: Payer: Self-pay | Admitting: Allergy and Immunology

## 2016-09-22 LAB — SEDIMENTATION RATE: Sed Rate: 2 mm/hr (ref 0–40)

## 2016-09-22 LAB — CYCLIC CITRUL PEPTIDE ANTIBODY, IGG/IGA: Cyclic Citrullin Peptide Ab: 5 units (ref 0–19)

## 2016-09-22 LAB — ANA W/REFLEX: Anti Nuclear Antibody(ANA): NEGATIVE

## 2016-09-22 LAB — C-REACTIVE PROTEIN: CRP: 0.5 mg/L (ref 0.0–4.9)

## 2016-09-22 LAB — ALLERGEN, STRAWBERRY, F44: Allergen Strawberry IgE: <0.1 kU/L

## 2016-09-22 LAB — ALDOLASE: Aldolase: 5.7 U/L (ref 3.3–10.3)

## 2016-09-22 LAB — RHEUMATOID FACTOR: Rhuematoid fact SerPl-aCnc: <10 IU/mL (ref 0.0–13.9)

## 2016-09-28 DIAGNOSIS — D171 Benign lipomatous neoplasm of skin and subcutaneous tissue of trunk: Secondary | ICD-10-CM | POA: Diagnosis not present

## 2016-10-17 ENCOUNTER — Telehealth: Payer: Self-pay | Admitting: Family Medicine

## 2016-10-18 ENCOUNTER — Ambulatory Visit (INDEPENDENT_AMBULATORY_CARE_PROVIDER_SITE_OTHER): Payer: Medicare Other

## 2016-10-18 DIAGNOSIS — Z23 Encounter for immunization: Secondary | ICD-10-CM

## 2016-10-24 ENCOUNTER — Ambulatory Visit (INDEPENDENT_AMBULATORY_CARE_PROVIDER_SITE_OTHER): Payer: Medicare Other | Admitting: Allergy and Immunology

## 2016-10-24 ENCOUNTER — Encounter: Payer: Self-pay | Admitting: Allergy and Immunology

## 2016-10-24 VITALS — BP 112/78 | HR 80 | Resp 20

## 2016-10-24 DIAGNOSIS — K219 Gastro-esophageal reflux disease without esophagitis: Secondary | ICD-10-CM

## 2016-10-24 DIAGNOSIS — Z91018 Allergy to other foods: Secondary | ICD-10-CM | POA: Diagnosis not present

## 2016-10-24 DIAGNOSIS — M791 Myalgia, unspecified site: Secondary | ICD-10-CM

## 2016-10-24 DIAGNOSIS — K2100 Gastro-esophageal reflux disease with esophagitis, without bleeding: Secondary | ICD-10-CM

## 2016-10-24 DIAGNOSIS — J4530 Mild persistent asthma, uncomplicated: Secondary | ICD-10-CM

## 2016-10-24 DIAGNOSIS — Z0000 Encounter for general adult medical examination without abnormal findings: Secondary | ICD-10-CM | POA: Diagnosis not present

## 2016-10-24 DIAGNOSIS — L500 Allergic urticaria: Secondary | ICD-10-CM

## 2016-10-24 MED ORDER — OMEPRAZOLE 40 MG PO CPDR: 40.0000 mg | DELAYED_RELEASE_CAPSULE | ORAL | 5 refills | Status: DC

## 2016-10-24 NOTE — Patient Instructions (Addendum)
  1. Allergen avoidance measures as best as possible  2. Every day use the following preventative medications:   A. loratadine 10 mg tablet twice a day  B. ranitidine 150 mg two tablets in evening  C. montelukast 10 mg tablet in evening  D. Omeprazole 40 mg tablet in morning  3. If needed:   A. albuterol MDI 2 inhalations or albuterol nebulization every 4-6 hours  B. EpiPen, Benadryl, M.D./ER evaluation for allergic reaction  4. Return to clinic in 8 weeks or earlier if problem  5. Obtain fall flu vaccine

## 2016-10-24 NOTE — Progress Notes (Signed)
Follow-up Note  Referring Provider: Chipper Herb, MD Primary Provider: Chipper Herb, MD Date of Office Visit: 10/24/2016  Subjective:   Leah [name] (DOB: 29-Apr-1950) is a 66 y.o. female who [facility] to [facility] on 10/24/2016 in re-evaluation of the following:  HPI: Leah [name] presents to this clinic in reevaluation of her urticaria, alpha gal syndrome, mild asthma, LPR, and muscle pain. She was last seen in this clinic during her initial evaluation of 09/20/2016 at which point in time we attempted to address each issue.  She did stop her pravastatin and her muscle issue is much better at this point. She has much less weakness and much less pain and she is improving with each week of staying away from pravastatin.  She still continues to have throat clearing and mucus stuck in her throat and occasional reflux. She is inconsistent about using either omeprazole or ranitidine.  Her hives are better. They have decreased in frequency and decreased in intensity. She is not very consistent about using loratadine.  She has only used her short-acting bronchodilator twice since being seen in this clinic last.  Allergies as of 10/24/2016      Reactions   Codeine Nausea And Vomiting   Penicillins Other (See Comments)   Does not know      Medication List      albuterol (2.5 MG/3ML) 0.083% nebulizer solution Commonly known as:  PROVENTIL Take 3 mLs (2.5 mg total) by nebulization every 6 (six) hours as needed for wheezing or shortness of breath.   albuterol 108 (90 Base) MCG/ACT inhaler Commonly known as:  PROVENTIL HFA;VENTOLIN HFA Inhale 2 puffs into the lungs every 6 (six) hours as needed for wheezing or shortness of breath.   aspirin 81 MG tablet Take 81 mg by mouth daily.   calcium carbonate 500 MG chewable tablet Commonly known as:  TUMS Chew 1 tablet (200 mg of elemental calcium total) by mouth as needed for indigestion or heartburn.   EPIPEN  2-PAK 0.3 mg/0.3 mL Soaj injection Generic drug:  EPINEPHrine Inject into the muscle once.   fish oil-omega-3 fatty acids 1000 MG capsule Take 1 g by mouth daily.   montelukast 10 MG tablet Commonly known as:  SINGULAIR Take 1 tablet (10 mg total) by mouth at bedtime.   olmesartan-hydrochlorothiazide 20-12.5 MG tablet Commonly known as:  BENICAR HCT Take 1 tablet by mouth daily.   omeprazole 40 MG capsule Commonly known as:  PRILOSEC Take 1 capsule (40 mg total) by mouth daily.   Vitamin D 2000 units tablet Take 2,000 Units by mouth daily.       Past Medical History:  Diagnosis Date  . Asthma   . Colon polyp   . Depression   . Food allergy   . GERD (gastroesophageal reflux disease)   . Hyperlipidemia   . Hypertension   . Urticaria     Past Surgical History:  Procedure Laterality Date  . ABDOMINAL HYSTERECTOMY    . Carpel Tunnel    . CHOLECYSTECTOMY    . COLONOSCOPY  02/08/2005   [id number] hemorrhoids otherwise normal rectum/ A few scattered, shallow, left-sided sigmoid diverticula/The remainder of the colonic mucosa appeared normal  . COLONOSCOPY WITH ESOPHAGOGASTRODUODENOSCOPY (EGD) N/A 11/29/2012   Procedure: COLONOSCOPY WITH ESOPHAGOGASTRODUODENOSCOPY (EGD);  Surgeon: Daneil Dolin, MD;  [facility];  Service: Endoscopy;  Laterality: N/A;  10:45  . HERNIA REPAIR     ventral X 2 with mesh, last  one 2011 [facility] complicated with 2 week hospital stay  . NISSEN FUNDOPLICATION  1610  . TUBAL LIGATION      Review of systems negative except as noted in HPI / PMHx or noted below:  Review of Systems  Constitutional: Negative.   HENT: Negative.   Eyes: Negative.   Respiratory: Negative.   Cardiovascular: Negative.   Gastrointestinal: Negative.   Genitourinary: Negative.   Musculoskeletal: Negative.   Skin: Negative.   Neurological: Negative.   Endo/Heme/Allergies: Negative.   Psychiatric/Behavioral: Negative.      Objective:    Vitals:   10/24/16 1611  BP: 112/78  Pulse: 80  Resp: 20          Physical Exam  Constitutional: She is well-developed, well-nourished, and in no distress.  Throat clearing  HENT:  Head: Normocephalic.  Right Ear: Tympanic membrane, external ear and ear canal normal.  Left Ear: Tympanic membrane, external ear and ear canal normal.  Nose: Nose normal. No mucosal edema or rhinorrhea.  Mouth/Throat: Uvula is midline, oropharynx is clear and moist and mucous membranes are normal. No oropharyngeal exudate.  Eyes: Conjunctivae are normal.  Neck: Trachea normal. No tracheal tenderness present. No tracheal deviation present. No thyromegaly present.  Cardiovascular: Normal rate, regular rhythm, S1 normal, S2 normal and normal heart sounds.   No murmur heard. Pulmonary/Chest: Breath sounds normal. No stridor. No respiratory distress. She has no wheezes. She has no rales.  Musculoskeletal: She exhibits no edema.  Lymphadenopathy:       Head (right side): No tonsillar adenopathy present.       Head (left side): No tonsillar adenopathy present.    She has no cervical adenopathy.  Neurological: She is alert. Gait normal.  Skin: No rash noted. She is not diaphoretic. No erythema. Nails show no clubbing.  Psychiatric: Mood and affect normal.    Diagnostics: Results of blood tests obtained 09/20/2016 identified normal aldolase, C-reactive protein 0.5 MG/L, sedimentation rate 2, negative a N/A, negative RA, negative CCP, negative IgE antibodies directed against strawberry   Spirometry was performed and demonstrated an FEV1 of 2.06 at 104 % of predicted.  The patient had an Asthma Control Test with the following results: ACT Total Score: 21.    Assessment and Plan:   1. Allergic urticaria   2. Food allergy - Alpha Gal Syndrome  3. Asthma, well controlled, mild persistent   4. LPRD (laryngopharyngeal reflux disease)   5. Muscle pain   6. Gastroesophageal reflux disease with esophagitis    7. Annual physical exam     1. Allergen avoidance measures as best as possible  2. Every day use the following preventative medications:   A. loratadine 10 mg tablet twice a day  B. ranitidine 150 mg two tablets in evening  C. montelukast 10 mg tablet in evening  D. Omeprazole 40 mg tablet in morning  3. If needed:   A. albuterol MDI 2 inhalations or albuterol nebulization every 4-6 hours  B. EpiPen, Benadryl, M.D./ER evaluation for allergic reaction  4. Return to clinic in 8 weeks or earlier if problem  5. Obtain fall flu vaccine  Eather is improving regarding her muscle pain now that she has stopped her pravastatin. I would like for her to utilize a collection of medical therapy that is designed to address her immunological hyperreactivity and her reflux-induced respiratory disease for at least 8 weeks. I'm not sure I'm going to be able to convince her to consistently use these medicines but I did inform  her that at the end of 8 weeks if she has a very good response and all her issues are resolved and we can begin to taper down her medications.  Allena Katz, MD Allergy / Immunology [facility]

## 2016-11-24 ENCOUNTER — Telehealth: Payer: Self-pay | Admitting: Family Medicine

## 2016-11-24 NOTE — Telephone Encounter (Signed)
Change to losartan 50/12.5 , one daily, check bps regularly

## 2016-11-27 MED ORDER — LOSARTAN POTASSIUM-HCTZ 50-12.5 MG PO TABS: 1.0000 | ORAL_TABLET | Freq: Every day | ORAL | 3 refills | Status: DC

## 2016-11-27 NOTE — Telephone Encounter (Signed)
Left detailed message for pt regarding new RX RX sent to pharmacy per pt request Okayed per Dr Laurance Flatten

## 2016-12-15 ENCOUNTER — Ambulatory Visit (INDEPENDENT_AMBULATORY_CARE_PROVIDER_SITE_OTHER): Payer: Medicare Other | Admitting: Family Medicine

## 2016-12-15 ENCOUNTER — Ambulatory Visit (INDEPENDENT_AMBULATORY_CARE_PROVIDER_SITE_OTHER): Payer: Medicare Other

## 2016-12-15 ENCOUNTER — Encounter: Payer: Self-pay | Admitting: Family Medicine

## 2016-12-15 VITALS — BP 123/75 | HR 65 | Temp 97.5°F | Ht 64.0 in | Wt 192.0 lb

## 2016-12-15 DIAGNOSIS — M19012 Primary osteoarthritis, left shoulder: Secondary | ICD-10-CM | POA: Diagnosis not present

## 2016-12-15 DIAGNOSIS — D171 Benign lipomatous neoplasm of skin and subcutaneous tissue of trunk: Secondary | ICD-10-CM

## 2016-12-15 DIAGNOSIS — M25512 Pain in left shoulder: Secondary | ICD-10-CM

## 2016-12-15 DIAGNOSIS — K2100 Gastro-esophageal reflux disease with esophagitis, without bleeding: Secondary | ICD-10-CM

## 2016-12-15 DIAGNOSIS — M791 Myalgia, unspecified site: Secondary | ICD-10-CM

## 2016-12-15 DIAGNOSIS — R1084 Generalized abdominal pain: Secondary | ICD-10-CM | POA: Diagnosis not present

## 2016-12-15 DIAGNOSIS — E559 Vitamin D deficiency, unspecified: Secondary | ICD-10-CM | POA: Diagnosis not present

## 2016-12-15 DIAGNOSIS — I10 Essential (primary) hypertension: Secondary | ICD-10-CM

## 2016-12-15 DIAGNOSIS — R52 Pain, unspecified: Secondary | ICD-10-CM

## 2016-12-15 DIAGNOSIS — R195 Other fecal abnormalities: Secondary | ICD-10-CM | POA: Diagnosis not present

## 2016-12-15 DIAGNOSIS — R350 Frequency of micturition: Secondary | ICD-10-CM | POA: Diagnosis not present

## 2016-12-15 DIAGNOSIS — E7800 Pure hypercholesterolemia, unspecified: Secondary | ICD-10-CM | POA: Diagnosis not present

## 2016-12-15 DIAGNOSIS — M47812 Spondylosis without myelopathy or radiculopathy, cervical region: Secondary | ICD-10-CM | POA: Diagnosis not present

## 2016-12-15 LAB — URINALYSIS, COMPLETE
Bilirubin, UA: NEGATIVE
Glucose, UA: NEGATIVE
Ketones, UA: NEGATIVE
Leukocytes, UA: NEGATIVE
Nitrite, UA: NEGATIVE
Protein, UA: NEGATIVE
RBC, UA: NEGATIVE
Specific Gravity, UA: 1.015 (ref 1.005–1.030)
Urobilinogen, Ur: 0.2 mg/dL (ref 0.2–1.0)
pH, UA: 7 (ref 5.0–7.5)

## 2016-12-15 LAB — MICROSCOPIC EXAMINATION
RBC, UA: NONE SEEN /hpf (ref 0–?)
Renal Epithel, UA: NONE SEEN /hpf

## 2016-12-15 NOTE — Patient Instructions (Addendum)
Medicare Annual Wellness Visit  [facility] and the medical providers at [facility] strive to bring you the best medical care.  In doing so we not only want to address your current medical conditions and concerns but also to detect new conditions early and prevent illness, disease and health-related problems.    Medicare offers a yearly Wellness Visit which allows our clinical staff to assess your need for preventative services including immunizations, lifestyle education, counseling to decrease risk of preventable diseases and screening for fall risk and other medical concerns.    This visit is provided free of charge (no copay) for all Medicare recipients. The clinical pharmacists at [facility] have begun to conduct these Wellness Visits which will also include a thorough review of all your medications.    As you primary medical provider recommend that you make an appointment for your Annual Wellness Visit if you have not done so already this year.  You may set up this appointment before you leave today or you may call back ([phone]) and schedule an appointment.  Please make sure when you call that you mention that you are scheduling your Annual Wellness Visit with the clinical pharmacist so that the appointment may be made for the proper length of time.     Continue current medications. Continue good therapeutic lifestyle changes which include good diet and exercise. Fall precautions discussed with patient. If an FOBT was given today- please return it to our front desk. If you are over 16 years old - you may need Prevnar 39 or the adult Pneumonia vaccine.  **Flu shots are available--- please call and schedule a FLU-CLINIC appointment**  After your visit with [place] today you will receive a survey in the mail or online from [employer] regarding your care with [place]. Please take a moment to fill this out. Your feedback is very  important to [place] as you can help [place] better understand your patient needs as well as improve your experience and satisfaction. WE CARE ABOUT YOU!!!   We will do a referral to the general surgeon to further evaluate the painful lipoma in your right chest wall We will do a referral to the rheumatologist because of all the persistent muscle aches and myalgias in addition to some blood work that we will add to your routine blood work today. Because of the ongoing bowel and abdominal pain bloating gas and diarrhea we will get an appointment with a gastroenterologist for further evaluation

## 2016-12-15 NOTE — Progress Notes (Signed)
Subjective:    Patient ID: Leah [name], female    DOB: 05-11-1950, 66 y.o.   MRN: [id number]  HPI Pt here for follow up and management of chronic medical problems which includes hyperlipidemia and hypertension. She is taking medication regularly.  The patient today complains of abnormal bowel movements.  She also wants a second opinion regarding a lesion on her back.  She is due to get a mammogram and will schedule this.  She is also due to return and FOBT.  She will come back for her fasting lab work.  She has had to stop taking her statin drug.  The patient has a history of asthma depression reflux disease hyperlipidemia and hypertension.  She has had her gallbladder removed hernia repair and a hysterectomy.  Her mother had Alzheimer's heart disease and diabetes.  The allergist recommended stopping the statin drugs because of her myalgias and muscle aches.  Today has multiple complaints.  She is concerned about this lesion on her back and would like to get a second opinion about this because it is painful and bothers her a lot.  She is also concerned about abnormal bowel movements that are large and loose in nature and abdominal bloatedness and constipation that occurs also.  She is not seen any blood in the stools or had any black tarry bowel movements.  She denies any chest pain and denies shortness of breath.  She has had some left shoulder pain that bothers her especially at nighttime.  She does complain of muscle aches and myalgias in general and she still having this even though she is not taking a statin drug.  She is taking a blood pressure pill with a diuretic and just seems to go a lot more frequently but has not had any burning associated with this.  She is currently not taking a statin and does not want to go back on 1.    Patient Active Problem List   Diagnosis Date Noted  . Adjustment disorder with mixed anxiety and depressed mood 03/25/2013  . Abdominal pain, chronic, epigastric  11/12/2012  . Nausea alone 11/12/2012  . Personal history of colonic polyps 11/12/2012  . Hypertension, benign essential, goal below 140/90 10/30/2012  . Hyperlipidemia 10/22/2012  . GERD (gastroesophageal reflux disease) 10/22/2012  . Unspecified asthma(493.90) 10/22/2012   Outpatient Encounter Medications as of 12/15/2016  Medication Sig  . albuterol (PROVENTIL HFA;VENTOLIN HFA) 108 (90 Base) MCG/ACT inhaler Inhale 2 puffs into the lungs every 6 (six) hours as needed for wheezing or shortness of breath.  Marland Kitchen albuterol (PROVENTIL) (2.5 MG/3ML) 0.083% nebulizer solution Take 3 mLs (2.5 mg total) by nebulization every 6 (six) hours as needed for wheezing or shortness of breath.  Marland Kitchen aspirin 81 MG tablet Take 81 mg by mouth daily.  . calcium carbonate (TUMS) 500 MG chewable tablet Chew 1 tablet (200 mg of elemental calcium total) by mouth as needed for indigestion or heartburn.  . Cholecalciferol (VITAMIN D) 2000 UNITS tablet Take 2,000 Units by mouth daily.  . fish oil-omega-3 fatty acids 1000 MG capsule Take 1 g by mouth daily.   Marland Kitchen loratadine (CLARITIN) 10 MG tablet Take 10 mg by mouth 2 (two) times daily.  Marland Kitchen losartan-hydrochlorothiazide (HYZAAR) 50-12.5 MG tablet Take 1 tablet daily by mouth.  . montelukast (SINGULAIR) 10 MG tablet Take 1 tablet (10 mg total) by mouth at bedtime.  Marland Kitchen omeprazole (PRILOSEC) 40 MG capsule Take 1 capsule (40 mg total) by mouth every morning.  Marland Kitchen  ranitidine (ZANTAC) 150 MG tablet Take 150 mg by mouth 2 (two) times daily.  Marland Kitchen EPINEPHrine (EPIPEN 2-PAK) 0.3 mg/0.3 mL IJ SOAJ injection Inject into the muscle once.  . [DISCONTINUED] olmesartan-hydrochlorothiazide (BENICAR HCT) 20-12.5 MG tablet Take 1 tablet by mouth daily. (Patient taking differently: Take 0.5 tablets by mouth daily. )   No facility-administered encounter medications on file as of 12/15/2016.       Review of Systems  Constitutional: Negative.   HENT: Negative.   Eyes: Negative.   Respiratory:  Negative.   Cardiovascular: Negative.   Gastrointestinal: Negative.        Abnormal BM   Endocrine: Negative.   Genitourinary: Negative.   Musculoskeletal: Negative.   Skin: Negative.        Place on back - needs 2nd opinion - DERM   Allergic/Immunologic: Negative.   Neurological: Negative.   Hematological: Negative.   Psychiatric/Behavioral: Negative.        Objective:   Physical Exam  Constitutional: She is oriented to person, place, and time. She appears well-developed and well-nourished. No distress.  HENT:  Head: Normocephalic and atraumatic.  Right Ear: External ear normal.  Left Ear: External ear normal.  Nose: Nose normal.  Mouth/Throat: Oropharynx is clear and moist. No oropharyngeal exudate.  Eyes: Conjunctivae and EOM are normal. Pupils are equal, round, and reactive to light. Right eye exhibits no discharge. Left eye exhibits no discharge. No scleral icterus.  Neck: Normal range of motion. Neck supple. No thyromegaly present.  No bruits thyromegaly or anterior cervical adenopathy.  The patient does complain of something that pops out on her neck periodically in certain positions and that it is very uncomfortable.  If she continues to have issues with it we may need to get an ultrasound of the soft tissue of the neck.  Cardiovascular: Normal rate, regular rhythm, normal heart sounds and intact distal pulses.  No murmur heard. The heart is regular at 72/min  Pulmonary/Chest: Effort normal and breath sounds normal. No respiratory distress. She has no wheezes. She has no rales.  Clear anteriorly and posteriorly with no wheezes or rhonchi and she has a dry cough.  Abdominal: Soft. Bowel sounds are normal. She exhibits no mass. There is tenderness. There is no rebound and no guarding.  Midline abdominal tenderness to be related to scar tissue because of multiple surgeries.  Musculoskeletal: Normal range of motion. She exhibits no edema.  Lymphadenopathy:    She has no  cervical adenopathy.  Neurological: She is alert and oriented to person, place, and time. She has normal reflexes. No cranial nerve deficit.  Skin: Skin is warm and dry. No rash noted.  Psychiatric: She has a normal mood and affect. Her behavior is normal. Judgment and thought content normal.  Nursing note and vitals reviewed.  BP 123/75 (BP Location: Left Arm)   Pulse 65   Temp (!) 97.5 F (36.4 C) (Oral)   Ht '5\' 4"'$  (1.626 m)   Wt 192 lb (87.1 kg)   BMI 32.96 kg/m    X-rays of C-spine and left shoulder are pending===     Assessment & Plan:  1. Pure hypercholesterolemia -Patient has been intolerant of statins and we will no longer ask her to take these as they have caused more muscle aches and myalgias. - CBC with Differential/Platelet; Future - Lipid panel; Future  2. Vitamin D deficiency -Continue current treatment pending results of lab work - CBC with Differential/Platelet; Future - VITAMIN D 25 Hydroxy (Vit-D Deficiency, Fractures);  Future  3. Gastroesophageal reflux disease with esophagitis -Continue with ranitidine twice daily for stomach before breakfast and supper and make an appointment with gastroenterology for follow-up of abdominal pain - CBC with Differential/Platelet; Future - Hepatic function panel; Future  4. Essential hypertension -Blood pressure is good today she will continue with current treatment - BMP8+EGFR; Future - CBC with Differential/Platelet; Future - Hepatic function panel; Future  5. Generalized abdominal pain -Appointment with GI  6. Loose bowel movements -Appointment with gastroenterologist  7. Pain in the muscles -Appointment with rheumatology  8. Body aches -Rheumatology referral plus lab work  9. Left shoulder pain, unspecified chronicity -C-spine and left shoulder films  10. Lipoma of torso -Refer to general surgeon  Patient Instructions                       Medicare Annual Wellness Visit  [facility] and the medical  providers at [facility] strive to bring you the best medical care.  In doing so we not only want to address your current medical conditions and concerns but also to detect new conditions early and prevent illness, disease and health-related problems.    Medicare offers a yearly Wellness Visit which allows our clinical staff to assess your need for preventative services including immunizations, lifestyle education, counseling to decrease risk of preventable diseases and screening for fall risk and other medical concerns.    This visit is provided free of charge (no copay) for all Medicare recipients. The clinical pharmacists at [facility] have begun to conduct these Wellness Visits which will also include a thorough review of all your medications.    As you primary medical provider recommend that you make an appointment for your Annual Wellness Visit if you have not done so already this year.  You may set up this appointment before you leave today or you may call back ([phone]) and schedule an appointment.  Please make sure when you call that you mention that you are scheduling your Annual Wellness Visit with the clinical pharmacist so that the appointment may be made for the proper length of time.     Continue current medications. Continue good therapeutic lifestyle changes which include good diet and exercise. Fall precautions discussed with patient. If an FOBT was given today- please return it to our front desk. If you are over 57 years old - you may need Prevnar 59 or the adult Pneumonia vaccine.  **Flu shots are available--- please call and schedule a FLU-CLINIC appointment**  After your visit with [place] today you will receive a survey in the mail or online from [employer] regarding your care with [place]. Please take a moment to fill this out. Your feedback is very important to [place] as you can help [place] better understand your patient needs as well as  improve your experience and satisfaction. WE CARE ABOUT YOU!!!   We will do a referral to the general surgeon to further evaluate the painful lipoma in your right chest wall We will do a referral to the rheumatologist because of all the persistent muscle aches and myalgias in addition to some blood work that we will add to your routine blood work today. Because of the ongoing bowel and abdominal pain bloating gas and diarrhea we will get an appointment with a gastroenterologist for further evaluation  Arrie Senate MD

## 2016-12-15 NOTE — Addendum Note (Signed)
Addended by: Zannie Cove on: 12/15/2016 11:19 AM   Modules accepted: Orders

## 2016-12-17 LAB — URINE CULTURE: Organism ID, Bacteria: NO GROWTH

## 2016-12-18 ENCOUNTER — Other Ambulatory Visit: Payer: Medicare Other

## 2016-12-18 DIAGNOSIS — R52 Pain, unspecified: Secondary | ICD-10-CM

## 2016-12-18 DIAGNOSIS — K2100 Gastro-esophageal reflux disease with esophagitis, without bleeding: Secondary | ICD-10-CM

## 2016-12-18 DIAGNOSIS — M791 Myalgia, unspecified site: Secondary | ICD-10-CM

## 2016-12-18 DIAGNOSIS — E7800 Pure hypercholesterolemia, unspecified: Secondary | ICD-10-CM | POA: Diagnosis not present

## 2016-12-18 DIAGNOSIS — E559 Vitamin D deficiency, unspecified: Secondary | ICD-10-CM

## 2016-12-18 DIAGNOSIS — I10 Essential (primary) hypertension: Secondary | ICD-10-CM | POA: Diagnosis not present

## 2016-12-19 ENCOUNTER — Encounter: Payer: Self-pay | Admitting: Allergy and Immunology

## 2016-12-19 ENCOUNTER — Ambulatory Visit (INDEPENDENT_AMBULATORY_CARE_PROVIDER_SITE_OTHER): Payer: Medicare Other | Admitting: Allergy and Immunology

## 2016-12-19 VITALS — BP 136/84 | HR 76 | Resp 16

## 2016-12-19 DIAGNOSIS — K219 Gastro-esophageal reflux disease without esophagitis: Secondary | ICD-10-CM | POA: Diagnosis not present

## 2016-12-19 DIAGNOSIS — M791 Myalgia, unspecified site: Secondary | ICD-10-CM

## 2016-12-19 DIAGNOSIS — J4530 Mild persistent asthma, uncomplicated: Secondary | ICD-10-CM | POA: Diagnosis not present

## 2016-12-19 DIAGNOSIS — L500 Allergic urticaria: Secondary | ICD-10-CM

## 2016-12-19 DIAGNOSIS — Z91018 Allergy to other foods: Secondary | ICD-10-CM | POA: Diagnosis not present

## 2016-12-19 LAB — CBC WITH DIFFERENTIAL/PLATELET
Basophils Absolute: 0 10*3/uL (ref 0.0–0.2)
Basos: 1 %
EOS (ABSOLUTE): 0.1 10*3/uL (ref 0.0–0.4)
Eos: 3 %
Hematocrit: 37.2 % (ref 34.0–46.6)
Hemoglobin: 12.4 g/dL (ref 11.1–15.9)
Immature Grans (Abs): 0 10*3/uL (ref 0.0–0.1)
Immature Granulocytes: 0 %
Lymphocytes Absolute: 1.1 10*3/uL (ref 0.7–3.1)
Lymphs: 41 %
MCH: 28 pg (ref 26.6–33.0)
MCHC: 33.3 g/dL (ref 31.5–35.7)
MCV: 84 fL (ref 79–97)
Monocytes Absolute: 0.2 10*3/uL (ref 0.1–0.9)
Monocytes: 9 %
Neutrophils Absolute: 1.2 10*3/uL — ABNORMAL LOW (ref 1.4–7.0)
Neutrophils: 46 %
Platelets: 250 10*3/uL (ref 150–379)
RBC: 4.43 x10E6/uL (ref 3.77–5.28)
RDW: 14.6 % (ref 12.3–15.4)
WBC: 2.6 10*3/uL — ABNORMAL LOW (ref 3.4–10.8)

## 2016-12-19 LAB — BMP8+EGFR
BUN/Creatinine Ratio: 22 (ref 12–28)
BUN: 19 mg/dL (ref 8–27)
CO2: 26 mmol/L (ref 20–29)
Calcium: 8.9 mg/dL (ref 8.7–10.3)
Chloride: 101 mmol/L (ref 96–106)
Creatinine, Ser: 0.87 mg/dL (ref 0.57–1.00)
GFR calc Af Amer: 81 mL/min/{1.73_m2} (ref >59)
GFR calc non Af Amer: 70 mL/min/{1.73_m2} (ref >59)
Glucose: 82 mg/dL (ref 65–99)
Potassium: 3.8 mmol/L (ref 3.5–5.2)
Sodium: 140 mmol/L (ref 134–144)

## 2016-12-19 LAB — HEPATIC FUNCTION PANEL
ALT: 43 IU/L — ABNORMAL HIGH (ref 0–32)
AST: 21 IU/L (ref 0–40)
Albumin: 3.8 g/dL (ref 3.6–4.8)
Alkaline Phosphatase: 80 IU/L (ref 39–117)
Bilirubin Total: 0.2 mg/dL (ref 0.0–1.2)
Bilirubin, Direct: 0.05 mg/dL (ref 0.00–0.40)
Total Protein: 6.1 g/dL (ref 6.0–8.5)

## 2016-12-19 LAB — VITAMIN D 25 HYDROXY (VIT D DEFICIENCY, FRACTURES): Vit D, 25-Hydroxy: 35.5 ng/mL (ref 30.0–100.0)

## 2016-12-19 LAB — LIPID PANEL
Chol/HDL Ratio: 5.3 ratio — ABNORMAL HIGH (ref 0.0–4.4)
Cholesterol, Total: 267 mg/dL — ABNORMAL HIGH (ref 100–199)
HDL: 50 mg/dL (ref >39)
LDL Calculated: 195 mg/dL — ABNORMAL HIGH (ref 0–99)
Triglycerides: 112 mg/dL (ref 0–149)
VLDL Cholesterol Cal: 22 mg/dL (ref 5–40)

## 2016-12-19 LAB — CK: Total CK: 214 U/L — ABNORMAL HIGH (ref 24–173)

## 2016-12-19 LAB — CYCLIC CITRUL PEPTIDE ANTIBODY, IGG/IGA: Cyclic Citrullin Peptide Ab: 6 units (ref 0–19)

## 2016-12-19 LAB — SEDIMENTATION RATE: Sed Rate: 5 mm/hr (ref 0–40)

## 2016-12-19 NOTE — Progress Notes (Signed)
Follow-up Note  Referring Provider: Chipper Herb, MD Primary Provider: Chipper Herb, MD Date of Office Visit: 12/19/2016  Subjective:   Leah [name] (DOB: 1950-12-19) is a 66 y.o. female who returns to the [facility] on 12/19/2016 in re-evaluation of the following:  HPI: Leah [name] returns to this clinic in reevaluation of urticaria, alpha gal syndrome, LPR, and history of mild asthma and muscle pain.  I last saw her in this clinic 24 October 2016.  She no longer has any skin problems.  She has been consistently using her loratadine twice a day as well as montelukast daily.  She remains away from consuming all mammal.  Her throat is a lot better.  She does not have the sensation of throat clearing or mucus stuck in her throat.  She has been very consistent about using omeprazole.  She is not using her ranitidine at this point.  She has had no need to use a short acting bronchodilator as her asthma has been invisible.  She still continues to have problems with muscle pain even though she stopped her pravastatin and she is scheduled to see a rheumatologist at some point in the near future.  In addition, she has been having oscillating diarrhea and constipation and is scheduled to see a gastroenterologist at some point in the near future.  She did obtain the flu vaccine.  Allergies as of 12/19/2016      Reactions   Codeine Nausea And Vomiting   Penicillins Other (See Comments)   Does not know      Medication List      albuterol (2.5 MG/3ML) 0.083% nebulizer solution Commonly known as:  PROVENTIL Take 3 mLs (2.5 mg total) by nebulization every 6 (six) hours as needed for wheezing or shortness of breath.   albuterol 108 (90 Base) MCG/ACT inhaler Commonly known as:  PROVENTIL HFA;VENTOLIN HFA Inhale 2 puffs into the lungs every 6 (six) hours as needed for wheezing or shortness of breath.   aspirin 81 MG tablet Take 81 mg by mouth daily.   calcium  carbonate 500 MG chewable tablet Commonly known as:  TUMS Chew 1 tablet (200 mg of elemental calcium total) by mouth as needed for indigestion or heartburn.   EPIPEN 2-PAK 0.3 mg/0.3 mL Soaj injection Generic drug:  EPINEPHrine Inject into the muscle once.   fish oil-omega-3 fatty acids 1000 MG capsule Take 1 g by mouth daily.   loratadine 10 MG tablet Commonly known as:  CLARITIN Take 10 mg by mouth 2 (two) times daily.   losartan-hydrochlorothiazide 50-12.5 MG tablet Commonly known as:  HYZAAR Take 1 tablet daily by mouth.   montelukast 10 MG tablet Commonly known as:  SINGULAIR Take 1 tablet (10 mg total) by mouth at bedtime.   omeprazole 40 MG capsule Commonly known as:  PRILOSEC Take 1 capsule (40 mg total) by mouth every morning.   ranitidine 150 MG tablet Commonly known as:  ZANTAC Take 150 mg by mouth 2 (two) times daily.   Vitamin D 2000 units tablet Take 2,000 Units by mouth daily.       Past Medical History:  Diagnosis Date  . Asthma   . Colon polyp   . Depression   . Food allergy   . GERD (gastroesophageal reflux disease)   . Hyperlipidemia   . Hypertension   . Urticaria     Past Surgical History:  Procedure Laterality Date  . ABDOMINAL HYSTERECTOMY    .  Carpel Tunnel    . CHOLECYSTECTOMY    . COLONOSCOPY  02/08/2005   RUE:[id number] hemorrhoids otherwise normal rectum/ A few scattered, shallow, left-sided sigmoid diverticula/The remainder of the colonic mucosa appeared normal  . COLONOSCOPY WITH ESOPHAGOGASTRODUODENOSCOPY (EGD) N/A 11/29/2012   Procedure: COLONOSCOPY WITH ESOPHAGOGASTRODUODENOSCOPY (EGD);  Surgeon: Daneil Dolin, MD;  Location: [facility];  Service: Endoscopy;  Laterality: N/A;  10:45  . HERNIA REPAIR     ventral X 2 with mesh, last one 2011 ([place]). complicated with 2 week hospital stay  . NISSEN FUNDOPLICATION  9147  . TUBAL LIGATION      Review of systems negative except as noted in HPI / PMHx or noted  below:  Review of Systems  Constitutional: Negative.   HENT: Negative.   Eyes: Negative.   Respiratory: Negative.   Cardiovascular: Negative.   Gastrointestinal: Negative.   Genitourinary: Negative.   Musculoskeletal: Negative.   Skin: Negative.   Neurological: Negative.   Endo/Heme/Allergies: Negative.   Psychiatric/Behavioral: Negative.      Objective:   Vitals:   12/19/16 1502  BP: 136/84  Pulse: 76  Resp: 16          Physical Exam  Constitutional: She is well-developed, well-nourished, and in no distress.  HENT:  Head: Normocephalic.  Right Ear: Tympanic membrane, external ear and ear canal normal.  Left Ear: Tympanic membrane, external ear and ear canal normal.  Nose: Nose normal. No mucosal edema or rhinorrhea.  Mouth/Throat: Uvula is midline, oropharynx is clear and moist and mucous membranes are normal. No oropharyngeal exudate.  Eyes: Conjunctivae are normal.  Neck: Trachea normal. No tracheal tenderness present. No tracheal deviation present. No thyromegaly present.  Cardiovascular: Normal rate, regular rhythm, S1 normal, S2 normal and normal heart sounds.  No murmur heard. Pulmonary/Chest: Breath sounds normal. No stridor. No respiratory distress. She has no wheezes. She has no rales.  Musculoskeletal: She exhibits no edema.  Lymphadenopathy:       Head (right side): No tonsillar adenopathy present.       Head (left side): No tonsillar adenopathy present.    She has no cervical adenopathy.  Neurological: She is alert. Gait normal.  Skin: No rash noted. She is not diaphoretic. No erythema. Nails show no clubbing.  Psychiatric: Mood and affect normal.    Diagnostics:    Spirometry was performed and demonstrated an FEV1 of 2.34 at 96 % of predicted.    Assessment and Plan:   1. Allergic urticaria   2. Food allergy   3. Asthma, well controlled, mild persistent   4. LPRD (laryngopharyngeal reflux disease)   5. Muscle pain     1. Allergen  avoidance measures against mammal consumption  2. Every day use the following preventative medications:   A. loratadine 10 mg tablet twice a day  B. montelukast 10 mg tablet in evening  C. Omeprazole 40 mg tablet in morning  3. If needed:   A. albuterol MDI 2 inhalations or albuterol nebulization every 4-6 hours  B. EpiPen, Benadryl, M.D./ER evaluation for allergic reaction  4. Return to clinic in 6 months or earlier if problem  Sherlyn appears to be doing very well with her skin and her LPR and her asthma and she will continue to utilize a combination of loratadine and omeprazole and montelukast.  The one concern I have about using omeprazole is that it may be precipitating an exacerbation of her oscillating diarrhea and constipation and she can address that issue with her gastroenterologist.  She will follow-up with her rheumatologist concerning her muscle pain issue.  I will see her back in his clinic in 6 months or earlier if there is a problem.  Allena Katz, MD Allergy / Immunology [facility]

## 2016-12-19 NOTE — Patient Instructions (Addendum)
  1. Allergen avoidance measures against mammal consumption  2. Every day use the following preventative medications:   A. loratadine 10 mg tablet twice a day  B. montelukast 10 mg tablet in evening  C. Omeprazole 40 mg tablet in morning  3. If needed:   A. albuterol MDI 2 inhalations or albuterol nebulization every 4-6 hours  B. EpiPen, Benadryl, M.D./ER evaluation for allergic reaction  4. Return to clinic in 6 months or earlier if problem

## 2016-12-20 ENCOUNTER — Encounter: Payer: Self-pay | Admitting: Allergy and Immunology

## 2016-12-20 ENCOUNTER — Telehealth: Payer: Self-pay | Admitting: Family Medicine

## 2017-01-02 DIAGNOSIS — M799 Soft tissue disorder, unspecified: Secondary | ICD-10-CM | POA: Diagnosis not present

## 2017-01-04 ENCOUNTER — Encounter: Payer: Self-pay | Admitting: Family Medicine

## 2017-01-04 ENCOUNTER — Ambulatory Visit (INDEPENDENT_AMBULATORY_CARE_PROVIDER_SITE_OTHER): Payer: Medicare Other | Admitting: Family Medicine

## 2017-01-04 VITALS — BP 150/86 | HR 70 | Temp 97.0°F | Ht 64.0 in | Wt 191.0 lb

## 2017-01-04 DIAGNOSIS — H9202 Otalgia, left ear: Secondary | ICD-10-CM

## 2017-01-04 NOTE — Progress Notes (Signed)
   HPI  Patient presents today with left ear pain.  Patient states that it started last night with a burning feeling in her left ear behind her ear where her glasses rest.  She states that this morning she developed a different sensation of heat and warmth on her left ear left neck and left upper shoulder with also a burning and tingling type character. He does not cross midline on her face. She has no weakness, numbness, or tingling of any of her arms or legs, she has normal speech and sensation otherwise.   PMH: Smoking status noted ROS: Per HPI  Objective: BP (!) 150/86   Pulse 70   Temp (!) 97 F (36.1 C) (Oral)   Ht 5\' 4"  (1.626 m)   Wt 191 lb (86.6 kg)   BMI 32.79 kg/m  Gen: NAD, alert, cooperative with exam HEENT: NCAT CV: RRR, good S1/S2, no murmur Resp: CTABL, no wheezes, non-labored Ext: No edema, warm Neuro: Alert and oriented, no nerves II through XII intact except for altered sensation in V1 through V3 And Very faint erythematous macules on the left neck and the left cheek, approximately 1 cm x 2 cm each.   Assessment and plan:  #Left ear pain Very unusual presentation, with distribution however the trigeminal area as well as down the neck I do not believe this is a central lesion like a stroke, however I have recommended and discussed multiple red flags and reasons to seek emergency medical care. Her symptoms are very characteristic of the prodrome of shingles, the very faint erythematous macules are not convincing however. Reassurance provided, if she develops more discrete rash I would recommend quick treatment with Valtrex plus or minus prednisone.  If She has any extension of the symptoms especially to the arms or legs or difficulty with speech I would recommend evaluation to rule out stroke.     Laroy Apple, MD [facility] 01/04/2017, 12:29 PM

## 2017-01-05 ENCOUNTER — Encounter: Payer: Self-pay | Admitting: *Deleted

## 2017-01-11 NOTE — Telephone Encounter (Signed)
Letter was sent about labs.

## 2017-01-17 ENCOUNTER — Encounter (HOSPITAL_COMMUNITY): Payer: Self-pay

## 2017-01-17 ENCOUNTER — Emergency Department (HOSPITAL_COMMUNITY): Payer: Medicare Other

## 2017-01-17 ENCOUNTER — Inpatient Hospital Stay (HOSPITAL_COMMUNITY)
Admission: EM | Admit: 2017-01-17 | Discharge: 2017-01-19 | DRG: 313 | Disposition: A | Discharge status: Home / Self Care | Payer: Medicare Other | Attending: Family Medicine | Admitting: Family Medicine

## 2017-01-17 ENCOUNTER — Other Ambulatory Visit: Payer: Self-pay

## 2017-01-17 DIAGNOSIS — I10 Essential (primary) hypertension: Secondary | ICD-10-CM | POA: Diagnosis not present

## 2017-01-17 DIAGNOSIS — J45909 Unspecified asthma, uncomplicated: Secondary | ICD-10-CM | POA: Diagnosis not present

## 2017-01-17 DIAGNOSIS — E785 Hyperlipidemia, unspecified: Secondary | ICD-10-CM | POA: Diagnosis present

## 2017-01-17 DIAGNOSIS — Z6832 Body mass index (BMI) 32.0-32.9, adult: Secondary | ICD-10-CM

## 2017-01-17 DIAGNOSIS — K219 Gastro-esophageal reflux disease without esophagitis: Secondary | ICD-10-CM | POA: Diagnosis not present

## 2017-01-17 DIAGNOSIS — Z7982 Long term (current) use of aspirin: Secondary | ICD-10-CM | POA: Diagnosis not present

## 2017-01-17 DIAGNOSIS — R079 Chest pain, unspecified: Secondary | ICD-10-CM | POA: Diagnosis present

## 2017-01-17 DIAGNOSIS — E782 Mixed hyperlipidemia: Secondary | ICD-10-CM | POA: Diagnosis not present

## 2017-01-17 DIAGNOSIS — E669 Obesity, unspecified: Secondary | ICD-10-CM | POA: Diagnosis present

## 2017-01-17 DIAGNOSIS — Z8249 Family history of ischemic heart disease and other diseases of the circulatory system: Secondary | ICD-10-CM

## 2017-01-17 DIAGNOSIS — R0789 Other chest pain: Secondary | ICD-10-CM | POA: Diagnosis not present

## 2017-01-17 DIAGNOSIS — Z90710 Acquired absence of both cervix and uterus: Secondary | ICD-10-CM

## 2017-01-17 DIAGNOSIS — Z87891 Personal history of nicotine dependence: Secondary | ICD-10-CM

## 2017-01-17 LAB — BASIC METABOLIC PANEL
Anion gap: 8 (ref 5–15)
BUN: 15 mg/dL (ref 6–20)
CO2: 28 mmol/L (ref 22–32)
Calcium: 9.4 mg/dL (ref 8.9–10.3)
Chloride: 104 mmol/L (ref 101–111)
Creatinine, Ser: 0.9 mg/dL (ref 0.44–1.00)
GFR calc Af Amer: >60 mL/min (ref >60)
GFR calc non Af Amer: >60 mL/min (ref >60)
Glucose, Bld: 79 mg/dL (ref 65–99)
Potassium: 3.6 mmol/L (ref 3.5–5.1)
Sodium: 140 mmol/L (ref 135–145)

## 2017-01-17 LAB — TROPONIN I
Troponin I: <0.03 ng/mL (ref <0.03)
Troponin I: <0.03 ng/mL (ref <0.03)

## 2017-01-17 LAB — CBC
HCT: 38.8 % (ref 36.0–46.0)
Hemoglobin: 12.6 g/dL (ref 12.0–15.0)
MCH: 27.5 pg (ref 26.0–34.0)
MCHC: 32.5 g/dL (ref 30.0–36.0)
MCV: 84.7 fL (ref 78.0–100.0)
Platelets: 269 10*3/uL (ref 150–400)
RBC: 4.58 MIL/uL (ref 3.87–5.11)
RDW: 13.9 % (ref 11.5–15.5)
WBC: 3 10*3/uL — ABNORMAL LOW (ref 4.0–10.5)

## 2017-01-17 LAB — RAPID URINE DRUG SCREEN, HOSP PERFORMED
Amphetamines: NOT DETECTED
Barbiturates: NOT DETECTED
Benzodiazepines: NOT DETECTED
Cocaine: NOT DETECTED
Opiates: NOT DETECTED
Tetrahydrocannabinol: NOT DETECTED

## 2017-01-17 LAB — I-STAT TROPONIN, ED
Troponin i, poc: 0 ng/mL (ref 0.00–0.08)
Troponin i, poc: 0 ng/mL (ref 0.00–0.08)

## 2017-01-17 LAB — TSH: TSH: 0.746 u[IU]/mL (ref 0.350–4.500)

## 2017-01-17 MED ORDER — LOSARTAN POTASSIUM 50 MG PO TABS
50.0000 mg | ORAL_TABLET | Freq: Every day | ORAL | Status: DC
  Administered 2017-01-18 – 2017-01-19 (×2): 50 mg via ORAL
  Filled 2017-01-17 (×2): qty 1

## 2017-01-17 MED ORDER — ALBUTEROL SULFATE (2.5 MG/3ML) 0.083% IN NEBU: 2.5000 mg | INHALATION_SOLUTION | Freq: Four times a day (QID) | RESPIRATORY_TRACT | Status: DC | PRN

## 2017-01-17 MED ORDER — NITROGLYCERIN 0.4 MG SL SUBL
0.4000 mg | SUBLINGUAL_TABLET | SUBLINGUAL | Status: DC | PRN
  Administered 2017-01-17: 0.4 mg via SUBLINGUAL
  Filled 2017-01-17: qty 1

## 2017-01-17 MED ORDER — ENOXAPARIN SODIUM 40 MG/0.4ML ~~LOC~~ SOLN
40.0000 mg | SUBCUTANEOUS | Status: DC
  Administered 2017-01-17 – 2017-01-18 (×2): 40 mg via SUBCUTANEOUS
  Filled 2017-01-17 (×2): qty 0.4

## 2017-01-17 MED ORDER — ONDANSETRON HCL 4 MG/2ML IJ SOLN
4.0000 mg | Freq: Four times a day (QID) | INTRAMUSCULAR | Status: DC | PRN
Start: 2017-01-17 — End: 2017-01-19

## 2017-01-17 MED ORDER — ASPIRIN 81 MG PO CHEW
324.0000 mg | CHEWABLE_TABLET | Freq: Once | ORAL | Status: AC
  Administered 2017-01-17: 324 mg via ORAL
  Filled 2017-01-17: qty 4

## 2017-01-17 MED ORDER — MORPHINE SULFATE (PF) 4 MG/ML IV SOLN: 2.0000 mg | INTRAVENOUS | Status: DC | PRN

## 2017-01-17 MED ORDER — OMEGA-3 FATTY ACIDS 1000 MG PO CAPS: 1.0000 g | ORAL_CAPSULE | Freq: Every day | ORAL | Status: DC

## 2017-01-17 MED ORDER — CALCIUM CARBONATE ANTACID 500 MG PO CHEW
1.0000 | CHEWABLE_TABLET | ORAL | Status: DC | PRN
  Filled 2017-01-17: qty 1

## 2017-01-17 MED ORDER — MONTELUKAST SODIUM 10 MG PO TABS
10.0000 mg | ORAL_TABLET | Freq: Every day | ORAL | Status: DC
  Administered 2017-01-17 – 2017-01-18 (×2): 10 mg via ORAL
  Filled 2017-01-17 (×3): qty 1

## 2017-01-17 MED ORDER — OMEGA-3-ACID ETHYL ESTERS 1 G PO CAPS
1.0000 g | ORAL_CAPSULE | Freq: Every day | ORAL | Status: DC
  Administered 2017-01-18 – 2017-01-19 (×2): 1 g via ORAL
  Filled 2017-01-17 (×2): qty 1

## 2017-01-17 MED ORDER — HYDRALAZINE HCL 20 MG/ML IJ SOLN: 5.0000 mg | INTRAMUSCULAR | Status: DC | PRN

## 2017-01-17 MED ORDER — LORATADINE 10 MG PO TABS
10.0000 mg | ORAL_TABLET | Freq: Two times a day (BID) | ORAL | Status: DC
  Administered 2017-01-17 – 2017-01-19 (×5): 10 mg via ORAL
  Filled 2017-01-17 (×5): qty 1

## 2017-01-17 MED ORDER — PANTOPRAZOLE SODIUM 40 MG PO TBEC
40.0000 mg | DELAYED_RELEASE_TABLET | Freq: Every day | ORAL | Status: DC
  Administered 2017-01-18 – 2017-01-19 (×2): 40 mg via ORAL
  Filled 2017-01-17 (×2): qty 1

## 2017-01-17 MED ORDER — ACETAMINOPHEN 500 MG PO TABS
1000.0000 mg | ORAL_TABLET | Freq: Once | ORAL | Status: AC
  Administered 2017-01-17: 1000 mg via ORAL
  Filled 2017-01-17: qty 2

## 2017-01-17 MED ORDER — ATORVASTATIN CALCIUM 40 MG PO TABS
40.0000 mg | ORAL_TABLET | Freq: Every day | ORAL | Status: DC
  Filled 2017-01-17 (×2): qty 1

## 2017-01-17 MED ORDER — ACETAMINOPHEN 325 MG PO TABS
650.0000 mg | ORAL_TABLET | ORAL | Status: DC | PRN
  Administered 2017-01-17 – 2017-01-18 (×2): 650 mg via ORAL
  Filled 2017-01-17 (×3): qty 2

## 2017-01-17 MED ORDER — ASPIRIN EC 81 MG PO TBEC
81.0000 mg | DELAYED_RELEASE_TABLET | Freq: Every day | ORAL | Status: DC
  Administered 2017-01-18 – 2017-01-19 (×2): 81 mg via ORAL
  Filled 2017-01-17 (×2): qty 1

## 2017-01-17 MED ORDER — FAMOTIDINE 20 MG PO TABS
20.0000 mg | ORAL_TABLET | Freq: Two times a day (BID) | ORAL | Status: DC
  Administered 2017-01-17 – 2017-01-19 (×5): 20 mg via ORAL
  Filled 2017-01-17 (×5): qty 1

## 2017-01-17 MED ORDER — HYDROCHLOROTHIAZIDE 12.5 MG PO CAPS
12.5000 mg | ORAL_CAPSULE | Freq: Every day | ORAL | Status: DC
  Administered 2017-01-18 – 2017-01-19 (×2): 12.5 mg via ORAL
  Filled 2017-01-17 (×2): qty 1

## 2017-01-17 MED ORDER — LOSARTAN POTASSIUM-HCTZ 50-12.5 MG PO TABS: 1.0000 | ORAL_TABLET | Freq: Every day | ORAL | Status: DC

## 2017-01-17 MED ORDER — GI COCKTAIL ~~LOC~~: 30.0000 mL | Freq: Four times a day (QID) | ORAL | Status: DC | PRN

## 2017-01-17 NOTE — ED Notes (Signed)
ED Provider at bedside. 

## 2017-01-17 NOTE — ED Notes (Signed)
Patient states she parked her car in a cardiac physician parking spot and is not concerned about her vehicle. Security in room to talk with patient.

## 2017-01-17 NOTE — ED Triage Notes (Signed)
Pt arrives with left sided chest pain x 2 days. Pt states she thought it was gas but this morning the pressure was radiating up left neck and into arm. Endorses nausea. Denies sob or vomiting.

## 2017-01-17 NOTE — H&P (Signed)
History and Physical    Leah [name] YQM:[id number] DOB: November 04, 1950 DOA: 01/17/2017  PCP: Chipper Herb, MD Consultants:  Arnoldo Morale - general surgery; Boyce Medici - allergy/asthma Patient coming from:  Home - lives alone; NOKPandora Leiter, [phone], [phone]  Chief Complaint: chest pain  HPI: Leah [name] is a 67 y.o. female with medical history significant of HTN; HLD; and depression presenting with chest pain.  For the last several days, she has had a little inkling that she attributed to gas.  She was hurting in left shoulder and arm.  She tried baking soda and water a few days ago.  This AM when she awoke, it was sharp and up in her left chest.  She went into the bathroom and fell against a wall, like she thought she would black out.  She seemed to feel better and walker her dogs but she was still having some chest pain.  She was going to visit a friend and the pain seemed to get even worse into the left chest and down the left arm and so she bypassed and came here instead.  No SOB but she was very nauseated.  No diaphoresis.  Not lightheaded.  Nausea is still off and on.  The pain is a whle lot better although not all the way gone.  She took tylenol and ASA in the ER.  She was given NTG which may have helped the pain but did contribute to a headache.  The NTG seemed to make her feel shaky, which resolved.  She has a little tingling in her left hand.  Her symptoms did not worsen this AM with exertion (she walked her dogs).  ED Course:  CP x 4 days intermittently with some CVD RF, worse pain this AM.  EKGs negative, troponins negative.  Last stress was at least 8-10 years ago.  Review of Systems: As per HPI; otherwise review of systems reviewed and negative.   Ambulatory Status:   Ambulates without assistance  Past Medical History:  Diagnosis Date  . Asthma   . Colon polyp   . Depression   . Food allergy   . GERD (gastroesophageal reflux disease)   . Hyperlipidemia   . Hypertension     . Urticaria     Past Surgical History:  Procedure Laterality Date  . ABDOMINAL HYSTERECTOMY    . Carpel Tunnel    . CHOLECYSTECTOMY    . COLONOSCOPY  02/08/2005   [id number] hemorrhoids otherwise normal rectum/ A few scattered, shallow, left-sided sigmoid diverticula/The remainder of the colonic mucosa appeared normal  . COLONOSCOPY WITH ESOPHAGOGASTRODUODENOSCOPY (EGD) N/A 11/29/2012   Procedure: COLONOSCOPY WITH ESOPHAGOGASTRODUODENOSCOPY (EGD);  Surgeon: Daneil Dolin, MD;  Location: [facility];  Service: Endoscopy;  Laterality: N/A;  10:45  . HERNIA REPAIR     ventral X 2 with mesh, last one 2011 ([place]). complicated with 2 week hospital stay  . NISSEN FUNDOPLICATION  3875  . TUBAL LIGATION      Social History   Socioeconomic History  . Marital status: Divorced    Spouse name: Not on file  . Number of children: 3  . Years of education: Not on file  . Highest education level: Not on file  Social Needs  . Financial resource strain: Not on file  . Food insecurity - worry: Not on file  . Food insecurity - inability: Not on file  . Transportation needs - medical: Not on file  . Transportation needs - non-medical: Not on file  Occupational History  . Occupation: private duty Manufacturing systems engineer  Tobacco Use  . Smoking status: Former Smoker    Packs/day: 0.50    Years: 28.00    Pack years: 14.00    Types: Cigarettes    Last attempt to quit: 01/17/1996    Years since quitting: 21.0  . Smokeless tobacco: Never Used  Substance and Sexual Activity  . Alcohol use: Yes    Alcohol/week: 0.0 oz    Comment: occasional use of wine   . Drug use: No  . Sexual activity: Not on file  Other Topics Concern  . Not on file  Social History Narrative  . Not on file    Allergies  Allergen Reactions  . Codeine Nausea And Vomiting  . Penicillins Other (See Comments)    Does not know    Family History  Problem Relation Age of Onset  . Heart disease Mother   . Alzheimer's  disease Mother   . Diabetes Mother   . Other Father        deceased age 80 from some sort of GI problems but no malignancy  . Diabetes Sister   . Diabetes Brother   . Cancer Cousin        pancreatic  . Colon cancer Neg Hx     Prior to Admission medications   Medication Sig Start Date End Date Taking? Authorizing Provider  albuterol (PROVENTIL HFA;VENTOLIN HFA) 108 (90 Base) MCG/ACT inhaler Inhale 2 puffs into the lungs every 6 (six) hours as needed for wheezing or shortness of breath. 02/16/16   Chipper Herb, MD  albuterol (PROVENTIL) (2.5 MG/3ML) 0.083% nebulizer solution Take 3 mLs (2.5 mg total) by nebulization every 6 (six) hours as needed for wheezing or shortness of breath. 02/16/16   Chipper Herb, MD  aspirin 81 MG tablet Take 81 mg by mouth daily.    [provider]  calcium carbonate (TUMS) 500 MG chewable tablet Chew 1 tablet (200 mg of elemental calcium total) by mouth as needed for indigestion or heartburn. 04/22/14   Cherre Robins, PharmD  Cholecalciferol (VITAMIN D) 2000 UNITS tablet Take 2,000 Units by mouth daily.    [provider]  EPINEPHrine (EPIPEN 2-PAK) 0.3 mg/0.3 mL IJ SOAJ injection Inject into the muscle once.    [provider]  fish oil-omega-3 fatty acids 1000 MG capsule Take 1 g by mouth daily.     [provider]  loratadine (CLARITIN) 10 MG tablet Take 10 mg by mouth 2 (two) times daily.    [provider]  losartan-hydrochlorothiazide Konrad Penta) 50-12.5 MG tablet Take 1 tablet daily by mouth. 11/27/16   Chipper Herb, MD  montelukast (SINGULAIR) 10 MG tablet Take 1 tablet (10 mg total) by mouth at bedtime. 09/20/16   Kozlow, Donnamarie Poag, MD  omeprazole (PRILOSEC) 40 MG capsule Take 1 capsule (40 mg total) by mouth every morning. 10/24/16   Kozlow, Donnamarie Poag, MD  ranitidine (ZANTAC) 150 MG tablet Take 150 mg by mouth 2 (two) times daily.    [provider]    Physical Exam: Vitals:   01/17/17 1200 01/17/17 1230  01/17/17 1300 01/17/17 1330  BP: (!) 142/87 134/89 125/83 140/87  Pulse: 67 62 75 (!) 111  Resp: 12 15 15 12   Temp:      TempSrc:      SpO2: 98% 100% 100% 97%  Weight:      Height:         General:  Appears calm  and comfortable and is NAD Eyes:  PERRL, EOMI, normal lids, iris ENT:  grossly normal hearing, lips & tongue, mmm; appropriate dentition Neck:  no LAD, masses or thyromegaly; no carotid bruits Cardiovascular:  RRR, no m/r/g. No LE edema.  Respiratory:   CTA bilaterally with no wheezes/rales/rhonchi.  Normal respiratory effort. Abdomen:  soft, NT, ND, NABS Back:   normal alignment, no CVAT Skin:  no rash or induration seen on limited exam Musculoskeletal:  grossly normal tone BUE/BLE, good ROM, no bony abnormality Psychiatric:  grossly normal mood and affect, speech fluent and appropriate, AOx3 Neurologic:  CN 2-12 grossly intact, moves all extremities in coordinated fashion, sensation intact    Radiological Exams on Admission: Dg Chest 2 View  Result Date: 01/17/2017 CLINICAL DATA:  Chest pain, tightness x2 days EXAM: CHEST  2 VIEW COMPARISON:  02/16/2016 FINDINGS: Lungs are clear.  No pleural effusion or pneumothorax. The heart is normal in size. Mild degenerative changes of the visualized thoracolumbar spine. IMPRESSION: No evidence of acute cardiopulmonary disease. Electronically Signed   By: Julian Hy M.D.   On: 01/17/2017 08:13    EKG: Independently reviewed.  NSR with rate 74; nonspecific ST changes with no evidence of acute ischemia; NSCSLT   Labs on Admission: I have personally reviewed the available labs and imaging studies at the time of the admission.  Pertinent labs:   Normal BMP Troponin 0.00 x 2 WBC 3.0 (stable), CBC otherwise normal Glucose 79 Lipids 12/18/16: TC 267/HDL 50/LDL 195/TG 112  Assessment/Plan Principal Problem:   Chest pain Active Problems:   Hyperlipidemia   GERD (gastroesophageal reflux disease)   Asthma   Hypertension,  benign essential, goal below 140/90   Chest pain -Patient with 4 days of left-sided chest pain that acutely worsened today; nonexertional and improved spontaneously -0/3 typical symptoms suggestive of noncardiac chest pain.  -CXR unremarkable.   -Initial cardiac troponin negative.  -EKG not indicative of acute ischemia.   -GRACE score is 113; which predicts an in-hospital death rate of 1.3%.  -Will plan to place in observation status on telemetry to rule out ACS by overnight observation.  -cycle troponin q6h x 3 and repeat EKG in AM -Continue ASA 81 mg  daily -morphine given -Will check TSH and UDS -Cardiology consultation in AM - NPO for possible stress test  -Will plan to start Heparin drip if enzymes are positive and/or chest pain recurs  HTN -Takes Cozaar monotherapy at home -Patient with suboptimal control while in the ER -Will also add prn IV hydralazine  HLD -Start Lipitor, continue fish oil -Lipids were checked in 12/18 so will not repeat at this time -LDL was markedly abnormal at that time and it does not appear that she was started on medication  GERD -Continue Prilosec (formulary substitution Protonix), Zantac, Tums  Asthma -Continue Albuterol; Claritin BID; Singulair  DVT prophylaxis: Lovenox  Code Status: Full - confirmed with patient/friend Family Communication: Friend present throughout evaluation Disposition Plan:  Home once clinically improved Consults called: Cardiology via [place] consult  Admission status: It is my clinical opinion that referral for OBSERVATION is reasonable and necessary in this patient based on the above information provided. The aforementioned taken together are felt to place the patient at high risk for further clinical deterioration. However it is anticipated that the patient may be medically stable for discharge from the hospital within 24 to 48 hours.    Karmen Bongo MD [facility]  If note is complete, please  contact covering daytime or  nighttime physician. [email or web address] Password [email or web address]  01/17/2017, 1:55 PM

## 2017-01-17 NOTE — ED Notes (Signed)
Patient refused IV at this time.

## 2017-01-17 NOTE — ED Provider Notes (Signed)
[facility] Provider Note   CSN: [id number] Arrival date & time: 01/17/17  0740     History   Chief Complaint Chief Complaint  Patient presents with  . Chest Pain    HPI Leah [name] is a 67 y.o. female with history of asthma, depression, GERD, HLD, HTN, and chronic epigastric pain presents today with chief complaint acute onset, intermittent left-sided chest pain for 4 days.  Patient states that she has been experiencing intermittent chest pain for the past 2-4 days.  No aggravating or alleviating factors noted.  She states that when she went to bed last night she did not have any pain but when she awoke around 6 AM this morning she noted worsening left-sided pressure sensation which radiates up to the left side of her neck and into her left upper arm.  Describes the pain as a constant dull pressure with occasional sharp stabbing pains.  Denies any known trauma or falls.  2 days ago she took some baking soda because she thought this pain was due to "gas ".  She states this pain feels different from her usual epigastric/GERD related symptoms.  She denies palpitations or leg swelling.  Denies shortness of breath, cough, fevers, chills, or orthopnea.  She endorses intermittent nausea but no vomiting as well as lightheadedness but no syncope.  She quit smoking 25 years ago, and denies recreational drug use or excessive caffeine intake.  Last stress test was 8-10 years ago and was within normal limits.  She denies recent travel or surgeries, hemoptysis, estrogen therapy, or prior history of DVT or PE.  The history is provided by the patient.    Past Medical History:  Diagnosis Date  . Asthma   . Colon polyp   . Depression   . Food allergy   . GERD (gastroesophageal reflux disease)   . Hyperlipidemia   . Hypertension   . Urticaria     Patient Active Problem List   Diagnosis Date Noted  . Adjustment disorder with mixed anxiety and depressed mood  03/25/2013  . Abdominal pain, chronic, epigastric 11/12/2012  . Nausea alone 11/12/2012  . Personal history of colonic polyps 11/12/2012  . Hypertension, benign essential, goal below 140/90 10/30/2012  . Hyperlipidemia 10/22/2012  . GERD (gastroesophageal reflux disease) 10/22/2012  . Unspecified asthma(493.90) 10/22/2012    Past Surgical History:  Procedure Laterality Date  . ABDOMINAL HYSTERECTOMY    . Carpel Tunnel    . CHOLECYSTECTOMY    . COLONOSCOPY  02/08/2005   [id number] hemorrhoids otherwise normal rectum/ A few scattered, shallow, left-sided sigmoid diverticula/The remainder of the colonic mucosa appeared normal  . COLONOSCOPY WITH ESOPHAGOGASTRODUODENOSCOPY (EGD) N/A 11/29/2012   Procedure: COLONOSCOPY WITH ESOPHAGOGASTRODUODENOSCOPY (EGD);  Surgeon: Daneil Dolin, MD;  Location: [facility];  Service: Endoscopy;  Laterality: N/A;  10:45  . HERNIA REPAIR     ventral X 2 with mesh, last one 2011 ([place]). complicated with 2 week hospital stay  . NISSEN FUNDOPLICATION  8841  . TUBAL LIGATION      OB History    No data available       Home Medications    Prior to Admission medications   Medication Sig Start Date End Date Taking? Authorizing Provider  albuterol (PROVENTIL HFA;VENTOLIN HFA) 108 (90 Base) MCG/ACT inhaler Inhale 2 puffs into the lungs every 6 (six) hours as needed for wheezing or shortness of breath. 02/16/16   Chipper Herb, MD  albuterol (PROVENTIL) (2.5 MG/3ML)  0.083% nebulizer solution Take 3 mLs (2.5 mg total) by nebulization every 6 (six) hours as needed for wheezing or shortness of breath. 02/16/16   Chipper Herb, MD  aspirin 81 MG tablet Take 81 mg by mouth daily.    [provider]  calcium carbonate (TUMS) 500 MG chewable tablet Chew 1 tablet (200 mg of elemental calcium total) by mouth as needed for indigestion or heartburn. 04/22/14   Cherre Robins, PharmD  Cholecalciferol (VITAMIN D) 2000 UNITS tablet Take 2,000 Units by  mouth daily.    [provider]  EPINEPHrine (EPIPEN 2-PAK) 0.3 mg/0.3 mL IJ SOAJ injection Inject into the muscle once.    [provider]  fish oil-omega-3 fatty acids 1000 MG capsule Take 1 g by mouth daily.     [provider]  loratadine (CLARITIN) 10 MG tablet Take 10 mg by mouth 2 (two) times daily.    [provider]  losartan-hydrochlorothiazide Konrad Penta) 50-12.5 MG tablet Take 1 tablet daily by mouth. 11/27/16   Chipper Herb, MD  montelukast (SINGULAIR) 10 MG tablet Take 1 tablet (10 mg total) by mouth at bedtime. 09/20/16   Kozlow, Donnamarie Poag, MD  omeprazole (PRILOSEC) 40 MG capsule Take 1 capsule (40 mg total) by mouth every morning. 10/24/16   Kozlow, Donnamarie Poag, MD  ranitidine (ZANTAC) 150 MG tablet Take 150 mg by mouth 2 (two) times daily.    [provider]    Family History Family History  Problem Relation Age of Onset  . Heart disease Mother   . Alzheimer's disease Mother   . Diabetes Mother   . Other Father        deceased age 104 from some sort of GI problems but no malignancy  . Diabetes Sister   . Diabetes Brother   . Cancer Cousin        pancreatic  . Colon cancer Neg Hx     Social History Social History   Tobacco Use  . Smoking status: Former Smoker    Packs/day: 0.50    Years: 28.00    Pack years: 14.00    Types: Cigarettes    Last attempt to quit: 01/17/1996    Years since quitting: 21.0  . Smokeless tobacco: Never Used  Substance Use Topics  . Alcohol use: Yes    Alcohol/week: 0.0 oz    Comment: occasional use of wine   . Drug use: No     Allergies   Codeine and Penicillins   Review of Systems Review of Systems  Constitutional: Negative for chills and fever.  Respiratory: Negative for shortness of breath.   Cardiovascular: Positive for chest pain. Negative for palpitations and leg swelling.  Gastrointestinal: Positive for nausea. Negative for abdominal pain, diarrhea and vomiting.  Musculoskeletal:  Positive for arthralgias and neck pain.  All other systems reviewed and are negative.    Physical Exam Updated Vital Signs BP 134/89   Pulse 62   Temp 97.8 F (36.6 C) (Oral)   Resp 15   Ht 5\' 4"  (1.626 m)   Wt 86.6 kg (191 lb)   SpO2 100%   BMI 32.79 kg/m   Physical Exam  Constitutional: She is oriented to person, place, and time. She appears well-developed and well-nourished. No distress.  HENT:  Head: Normocephalic and atraumatic.  Eyes: Conjunctivae and EOM are normal. Pupils are equal, round, and reactive to light. Right eye exhibits no discharge. Left eye exhibits no discharge.  Neck: Normal range of motion. Neck  supple. No JVD present. No tracheal deviation present.  No midlsine spine tenderness, left sided neck pain. No deformity, crepitus, or stepoff noted.   Cardiovascular: Normal rate and regular rhythm.  No murmur heard. Pulses:      Carotid pulses are 2+ on the right side, and 2+ on the left side.      Radial pulses are 2+ on the right side, and 2+ on the left side.       Dorsalis pedis pulses are 2+ on the right side, and 2+ on the left side.       Posterior tibial pulses are 2+ on the right side, and 2+ on the left side.  2+ radial and DP/PT pulses bl, negative Homan's bl, no lower extremity edema.   Pulmonary/Chest: Effort normal and breath sounds normal. No accessory muscle usage. No tachypnea.  Left upper anterior chest wall with tenderness to palpation, no deformity, ecchymosis, crepitus, or paradoxical wall motion noted.  Pain also worsens with internal rotation of the left shoulder.  Abdominal: Soft. Bowel sounds are normal. She exhibits no distension. There is no tenderness.  Musculoskeletal: She exhibits no edema.       Right lower leg: Normal. She exhibits no tenderness and no edema.       Left lower leg: Normal. She exhibits no tenderness and no edema.  Neurological: She is alert and oriented to person, place, and time. No cranial nerve deficit.    Fluent speech, no facial droop  Skin: Skin is warm and dry. No erythema.  Psychiatric: She has a normal mood and affect. Her behavior is normal.  Nursing note and vitals reviewed.    ED Treatments / Results  Labs (all labs ordered are listed, but only abnormal results are displayed) Labs Reviewed  CBC - Abnormal; Notable for the following components:      Result Value   WBC 3.0 (*)    All other components within normal limits  BASIC METABOLIC PANEL  I-STAT TROPONIN, ED  I-STAT TROPONIN, ED    EKG  EKG Interpretation  Date/Time:  Wednesday January 17 2017 07:44:01 EST Ventricular Rate:  74 PR Interval:  168 QRS Duration: 84 QT Interval:  384 QTC Calculation: 426 R Axis:   44 Text Interpretation:  Normal sinus rhythm Possible Anterior infarct , age undetermined No significant change since last tracing Confirmed by Blanchie Dessert [id number]) on 01/17/2017 9:14:54 AM       Radiology Dg Chest 2 View  Result Date: 01/17/2017 CLINICAL DATA:  Chest pain, tightness x2 days EXAM: CHEST  2 VIEW COMPARISON:  02/16/2016 FINDINGS: Lungs are clear.  No pleural effusion or pneumothorax. The heart is normal in size. Mild degenerative changes of the visualized thoracolumbar spine. IMPRESSION: No evidence of acute cardiopulmonary disease. Electronically Signed   By: Julian Hy M.D.   On: 01/17/2017 08:13    Procedures Procedures (including critical care time)  Medications Ordered in ED Medications  nitroGLYCERIN (NITROSTAT) SL tablet 0.4 mg (0.4 mg Sublingual Given 01/17/17 1234)  acetaminophen (TYLENOL) tablet 1,000 mg (1,000 mg Oral Given 01/17/17 1015)  aspirin chewable tablet 324 mg (324 mg Oral Given 01/17/17 1015)     Initial Impression / Assessment and Plan / ED Course  I have reviewed the triage vital signs and the nursing notes.  Pertinent labs & imaging results that were available during my care of the patient were reviewed by me and considered in my medical decision  making (see chart for details).  Patient presents with progressively worsening intermittent left-sided chest pain.  Associated symptoms include nausea and lightheadedness.  Afebrile, initially hypertensive with some improvement while in the ED.  Her chest pain improved after aspirin and Tylenol.  Serial troponins are negative, and EKG appears unchanged from last EKG.  Chest x-ray shows no evidence of acute cardiopulmonary abnormality with no evidence of pneumonia, bronchitis, or pleural effusion.  No evidence of pericarditis or myocarditis.  Pain is somewhat reproducible on palpation, however with her other risk factors (HEART score of 4), it is reasonable to admit the patient for serial troponins and possible stress test for further cardiac workup. Spoke with Dr. Lorin Mercy with hospitalist service who agrees to assume care of patient and bring her into the hospital for further evaluation and management.  Patient seen and evaluated by Dr. Maryan Rued who agrees with assessment and plan at this time.  Final Clinical Impressions(s) / ED Diagnoses   Final diagnoses:  Atypical chest pain    ED Discharge Orders    None       Debroah Baller 01/17/17 1307    Blanchie Dessert, MD 01/17/17 2112

## 2017-01-18 ENCOUNTER — Observation Stay (HOSPITAL_COMMUNITY): Payer: Medicare Other

## 2017-01-18 DIAGNOSIS — I2510 Atherosclerotic heart disease of native coronary artery without angina pectoris: Secondary | ICD-10-CM | POA: Diagnosis not present

## 2017-01-18 DIAGNOSIS — E785 Hyperlipidemia, unspecified: Secondary | ICD-10-CM | POA: Diagnosis not present

## 2017-01-18 DIAGNOSIS — R079 Chest pain, unspecified: Secondary | ICD-10-CM

## 2017-01-18 DIAGNOSIS — R072 Precordial pain: Secondary | ICD-10-CM | POA: Diagnosis not present

## 2017-01-18 DIAGNOSIS — E669 Obesity, unspecified: Secondary | ICD-10-CM | POA: Diagnosis not present

## 2017-01-18 DIAGNOSIS — Z7982 Long term (current) use of aspirin: Secondary | ICD-10-CM | POA: Diagnosis not present

## 2017-01-18 DIAGNOSIS — J45909 Unspecified asthma, uncomplicated: Secondary | ICD-10-CM | POA: Diagnosis not present

## 2017-01-18 DIAGNOSIS — I10 Essential (primary) hypertension: Secondary | ICD-10-CM | POA: Diagnosis not present

## 2017-01-18 DIAGNOSIS — Z90710 Acquired absence of both cervix and uterus: Secondary | ICD-10-CM | POA: Diagnosis not present

## 2017-01-18 DIAGNOSIS — Z6832 Body mass index (BMI) 32.0-32.9, adult: Secondary | ICD-10-CM | POA: Diagnosis not present

## 2017-01-18 DIAGNOSIS — Z8249 Family history of ischemic heart disease and other diseases of the circulatory system: Secondary | ICD-10-CM | POA: Diagnosis not present

## 2017-01-18 DIAGNOSIS — Z87891 Personal history of nicotine dependence: Secondary | ICD-10-CM | POA: Diagnosis not present

## 2017-01-18 DIAGNOSIS — R0789 Other chest pain: Secondary | ICD-10-CM | POA: Diagnosis not present

## 2017-01-18 DIAGNOSIS — K219 Gastro-esophageal reflux disease without esophagitis: Secondary | ICD-10-CM | POA: Diagnosis not present

## 2017-01-18 LAB — TROPONIN I: Troponin I: <0.03 ng/mL (ref <0.03)

## 2017-01-18 LAB — HIV ANTIBODY (ROUTINE TESTING W REFLEX): HIV Screen 4th Generation wRfx: NONREACTIVE

## 2017-01-18 MED ORDER — METOPROLOL TARTRATE 5 MG/5ML IV SOLN
5.0000 mg | Freq: Once | INTRAVENOUS | Status: AC
  Administered 2017-01-18: 5 mg via INTRAVENOUS

## 2017-01-18 MED ORDER — METOPROLOL TARTRATE 5 MG/5ML IV SOLN
INTRAVENOUS | Status: AC
  Filled 2017-01-18: qty 5

## 2017-01-18 MED ORDER — NITROGLYCERIN 0.4 MG SL SUBL
0.8000 mg | SUBLINGUAL_TABLET | Freq: Once | SUBLINGUAL | Status: AC
  Administered 2017-01-18: 0.8 mg via SUBLINGUAL

## 2017-01-18 MED ORDER — IOPAMIDOL (ISOVUE-370) INJECTION 76%
INTRAVENOUS | Status: AC
  Administered 2017-01-18: 80 mL
  Filled 2017-01-18: qty 100

## 2017-01-18 MED ORDER — NITROGLYCERIN 0.4 MG SL SUBL
SUBLINGUAL_TABLET | SUBLINGUAL | Status: AC
  Filled 2017-01-18: qty 2

## 2017-01-18 NOTE — Consult Note (Signed)
Cardiology Consultation:   Patient ID: Leah [name]; [id number]; April 17, 1950   Admit date: 01/17/2017 Date of Consult: 01/18/2017  Primary Care Provider: Chipper Herb, MD Primary Cardiologist: new - Dr. Angelena [name] Primary Electrophysiologist:     Patient Profile:   Leah [name] is a 67 y.o. female with a hx of HTN, HLD, obesity, former smoker, and GERD who is being seen today for the evaluation of chest pain at the request of Dr. Lorin [name].  History of Present Illness:   Leah [name] is new to this service and has no known CAD to date. She presented to [facility] with 4 day history of chest pain. Chest pain started on Saturday 01/13/17 while she was washing dishes. The chest pain was located in her center-left chest and radiated down her left shoulder and arm. She has baseline pain in her left shoulder and arm that is worse with movement, but states this pain was different. She continued ot have intermittent chest pain with radiation to her arm over the next several days. She also describes the pain waking her from sleep. She thought the pain was due to indigestion and tried to drink baking soda and water with mild relief. Yesterday morning, the pain returned but was much worse, rated as a 8/10 with radiation down her left arm. Because the pain was so much worse, she reported to [facility] for further evaluation. She was nauseous, but not diaphoretic or short of breath. She denies recent illness, lower extremity swelling, and bleeding problems. On arrival, she was given 324 mg ASA and nitro with relief of her pain. Since admission, tylenol has largely controlled her chest pain.   On my interview, she is resting comfortably in bed. She states she can't lay flat to sleep because of her asthma and that this is not a new problem for her.    Past Medical History:  Diagnosis Date  . Asthma   . Colon polyp   . Depression   . Food allergy   . GERD (gastroesophageal reflux disease)   . Hyperlipidemia   .  Hypertension   . Urticaria     Past Surgical History:  Procedure Laterality Date  . ABDOMINAL HYSTERECTOMY    . Carpel Tunnel    . CHOLECYSTECTOMY    . COLONOSCOPY  02/08/2005   ZDG:LOVFIEPP hemorrhoids otherwise normal rectum/ A few scattered, shallow, left-sided sigmoid diverticula/The remainder of the colonic mucosa appeared normal  . COLONOSCOPY WITH ESOPHAGOGASTRODUODENOSCOPY (EGD) N/A 11/29/2012   Procedure: COLONOSCOPY WITH ESOPHAGOGASTRODUODENOSCOPY (EGD);  Surgeon: Leah Dolin, MD;  Location: [facility];  Service: Endoscopy;  Laterality: N/A;  10:45  . HERNIA REPAIR     ventral X 2 with mesh, last one 2011 ([place]). complicated with 2 week hospital stay  . NISSEN FUNDOPLICATION  2951  . TUBAL LIGATION       Home Medications:  Prior to Admission medications   Medication Sig Start Date End Date Taking? Authorizing Provider  albuterol (PROVENTIL HFA;VENTOLIN HFA) 108 (90 Base) MCG/ACT inhaler Inhale 2 puffs into the lungs every 6 (six) hours as needed for wheezing or shortness of breath. 02/16/16  Yes Leah Herb, MD  albuterol (PROVENTIL) (2.5 MG/3ML) 0.083% nebulizer solution Take 3 mLs (2.5 mg total) by nebulization every 6 (six) hours as needed for wheezing or shortness of breath. 02/16/16  Yes Leah Herb, MD  aspirin 81 MG tablet Take 81 mg by mouth daily.   Yes [provider]  calcium carbonate (TUMS) 500 MG  chewable tablet Chew 1 tablet (200 mg of elemental calcium total) by mouth as needed for indigestion or heartburn. 04/22/14  Yes Leah [name], PharmD  Cholecalciferol (VITAMIN D) 2000 UNITS tablet Take 2,000 Units by mouth daily.   Yes [provider]  EPINEPHrine (EPIPEN 2-PAK) 0.3 mg/0.3 mL IJ SOAJ injection Inject into the muscle once.   Yes [provider]  fish oil-omega-3 fatty acids 1000 MG capsule Take 1 g by mouth daily.    Yes [provider]  loratadine (CLARITIN) 10 MG tablet Take 10 mg by mouth 2 (two)  times daily.   Yes [provider]  losartan-hydrochlorothiazide Konrad Penta) 50-12.5 MG tablet Take 1 tablet daily by mouth. 11/27/16  Yes Leah Herb, MD  montelukast (SINGULAIR) 10 MG tablet Take 1 tablet (10 mg total) by mouth at bedtime. 09/20/16  Yes [name], Leah Poag, MD  omeprazole (PRILOSEC) 40 MG capsule Take 1 capsule (40 mg total) by mouth every morning. 10/24/16  Yes [name], Leah Poag, MD  ranitidine (ZANTAC) 150 MG tablet Take 150 mg by mouth 2 (two) times daily.   Yes [provider]    Inpatient Medications: Scheduled Meds: . aspirin EC  81 mg Oral Daily  . atorvastatin  40 mg Oral q1800  . enoxaparin (LOVENOX) injection  40 mg Subcutaneous Q24H  . famotidine  20 mg Oral BID  . losartan  50 mg Oral Daily   And  . hydrochlorothiazide  12.5 mg Oral Daily  . loratadine  10 mg Oral BID  . montelukast  10 mg Oral QHS  . omega-3 acid ethyl esters  1 g Oral Daily  . pantoprazole  40 mg Oral Daily   Continuous Infusions:  PRN Meds: acetaminophen, albuterol, calcium carbonate, gi cocktail, hydrALAZINE, morphine injection, nitroGLYCERIN, ondansetron (ZOFRAN) IV  Allergies:    Allergies  Allergen Reactions  . Codeine Nausea And Vomiting  . Penicillins Other (See Comments)    Does not know    Social History:   Social History   Socioeconomic History  . Marital status: Divorced    Spouse name: Not on file  . Number of children: 3  . Years of education: Not on file  . Highest education level: Not on file  Social Needs  . Financial resource strain: Not on file  . Food insecurity - worry: Not on file  . Food insecurity - inability: Not on file  . Transportation needs - medical: Not on file  . Transportation needs - non-medical: Not on file  Occupational History  . Occupation: private duty Manufacturing systems engineer  Tobacco Use  . Smoking status: Former Smoker    Packs/day: 0.50    Years: 28.00    Pack years: 14.00    Types: Cigarettes    Last attempt to quit:  01/17/1996    Years since quitting: 21.0  . Smokeless tobacco: Never Used  Substance and Sexual Activity  . Alcohol use: Yes    Alcohol/week: 0.0 oz    Comment: occasional use of wine   . Drug use: No  . Sexual activity: Not on file  Other Topics Concern  . Not on file  Social History Narrative  . Not on file    Family History:    Family History  Problem Relation Age of Onset  . Heart disease Mother   . Alzheimer's disease Mother   . Diabetes Mother   . Other Father        deceased age 26 from some sort of GI  problems but no malignancy  . Diabetes Sister   . Diabetes Brother   . Cancer Cousin        pancreatic  . Colon cancer Neg Hx      ROS:  Please see the history of present illness.  ROS  All other ROS reviewed and negative.     Physical Exam/Data:   Vitals:   01/17/17 1851 01/17/17 2008 01/17/17 2202 01/18/17 0409  BP: (!) 146/86  132/83   Pulse: 73  79   Resp: (!) 22  17   Temp:  98.4 F (36.9 C)  98.8 F (37.1 C)  TempSrc:  Oral  Oral  SpO2: 100%  99%   Weight:      Height:        Intake/Output Summary (Last 24 hours) at 01/18/2017 1036 Last data filed at 01/18/2017 0856 Gross per 24 hour  Intake 0 ml  Output -  Net 0 ml   Filed Weights   01/17/17 0750  Weight: 191 lb (86.6 kg)   Body mass index is 32.79 kg/m.  General:  Well nourished, well developed, in no acute distress HEENT: normal Neck: no JVD Vascular: No carotid bruits Cardiac:  normal S1, S2; RRR; no murmur Lungs:  clear to auscultation bilaterally, no wheezing, rhonchi or rales  Abd: soft, nontender, no hepatomegaly  Ext: no edema Musculoskeletal:  No deformities, BUE and BLE strength normal and equal Skin: warm and dry  Neuro:  CNs 2-12 intact, no focal abnormalities noted Psych:  Normal affect   EKG:  The EKG was personally reviewed and demonstrates:  Sinus rhythm, poor R wave progression Telemetry:  Telemetry was personally reviewed and demonstrates:  sinus  Relevant CV  Studies:  none  Laboratory Data:  Chemistry Recent Labs  Lab 01/17/17 0809  NA 140  K 3.6  CL 104  CO2 28  GLUCOSE 79  BUN 15  CREATININE 0.90  CALCIUM 9.4  GFRNONAA >60  GFRAA >60  ANIONGAP 8    No results for input(s): PROT, ALBUMIN, AST, ALT, ALKPHOS, BILITOT in the last 168 hours. Hematology Recent Labs  Lab 01/17/17 0809  WBC 3.0*  RBC 4.58  HGB 12.6  HCT 38.8  MCV 84.7  MCH 27.5  MCHC 32.5  RDW 13.9  PLT 269   Cardiac Enzymes Recent Labs  Lab 01/17/17 1407 01/17/17 1857 01/18/17 0213  TROPONINI <0.03 <0.03 <0.03    Recent Labs  Lab 01/17/17 0811 01/17/17 1208  TROPIPOC 0.00 0.00    BNPNo results for input(s): BNP, PROBNP in the last 168 hours.  DDimer No results for input(s): DDIMER in the last 168 hours.  Radiology/Studies:  Dg Chest 2 View  Result Date: 01/17/2017 CLINICAL DATA:  Chest pain, tightness x2 days EXAM: CHEST  2 VIEW COMPARISON:  02/16/2016 FINDINGS: Lungs are clear.  No pleural effusion or pneumothorax. The heart is normal in size. Mild degenerative changes of the visualized thoracolumbar spine. IMPRESSION: No evidence of acute cardiopulmonary disease. Electronically Signed   By: Julian Hy M.D.   On: 01/17/2017 08:13    Assessment and Plan:   1. Chest pain - troponin x 3 negative - EKG with poor R wave progression - risk factors for ACS include HTN, HLD, and obesity. She is a former smoker and reports heart disease in her mother. She has symptoms concerning for unstable angina, although with some atypical features. Will recommend ischemic evaluation with coronary CT, which can be done today. Please keep NPO. She will need  an 18 gauge PIV. HR is in the 60s.  2. HTN - continue home meds losartan, HCTZ  3. HLD - not on a statin - LDL 195 on 12/28/16 - needs to start on statin given her risk factors for ACS   For questions or updates, please contact [facility] [place] Please consult [email or web address] for contact info under  Cardiology/STEMI.   Signed, Ledora Bottcher, PA  01/18/2017 10:36 AM   I have personally seen and examined this patient with Doreene Adas, PA. I agree with the assessment and plan as outlined above. She has no known CAD. She has a history of HTN, HLD and former tobacco abuse. She is now admitted with chest pain. Troponin is negative. EKG has non-specific changes. I have reviewed the EKG and it shows sinus rhythm with poor R wave progression through the precordial leads.  She is having no chest pain at this time.  Labs reviewed by me.  My exam:   General: Well developed, well nourished, NAD  HEENT: OP clear, mucus membranes moist  SKIN: warm, dry. No rashes. Neuro: No focal deficits  Musculoskeletal: Muscle strength 5/5 all ext  Psychiatric: Mood and affect normal  Neck: No JVD, no carotid bruits, no thyromegaly, no lymphadenopathy.  Lungs:Clear bilaterally, no wheezes, rhonci, crackles Cardiovascular: Regular rate and rhythm. No murmurs, gallops or rubs. Abdomen:Soft. Bowel sounds present. Non-tender.  Extremities: No lower extremity edema. Pulses are 2 + in the bilateral DP/PT.  Plan:   Chest pain in a patient with risk factors for CAD including obesity, HTN, HLD and former tobacco abuse. No objective evidence of ischemia.  Will plan cardiac CTA today to exclude obstructive CAD.  Further plans to follow after cath.   Lauree Chandler 01/18/2017 11:34 AM

## 2017-01-18 NOTE — Progress Notes (Signed)
Transport to room to take pt for CT. Pola Corn, RN

## 2017-01-18 NOTE — Progress Notes (Signed)
PROGRESS NOTE    Leah [name]  YQM:[id number] DOB: 1950-09-19 DOA: 01/17/2017 PCP: Chipper Herb, MD    Brief Narrative:  67 y.o. female with medical history significant of HTN; HLD; and depression presenting with chest pain.  For the last several days, she has had a little inkling that she attributed to gas.  She was hurting in left shoulder and arm.    Troponins negative x 3 and cardiology consulted for evaluation of chest discomfort suspicious for angina type pain.   Assessment & Plan:   Principal Problem:    Chest pain - aspirin and statin/lovaza on board. - Cardiology on board and plans for further evaluation  Active Problems:   Hyperlipidemia - statin/lovaza on board    GERD (gastroesophageal reflux disease) - Pt on protonix    Asthma - pt on albuterol and stable    Hypertension, benign essential, goal below 140/90 - Pt on hydrochlorothiazide, cozaar   DVT prophylaxis: Lovenox Code Status: Full Family Communication: none at bedside. Disposition Plan: per specialist   Consultants:   Cardiology: Dr. Angelena Form   Procedures: none   Antimicrobials: none   Subjective: Pt states she has no chest pain currently. It does however come on and off and she is complaining of left arm discomfort  Objective: Vitals:   01/17/17 2202 01/18/17 0409 01/18/17 1200 01/18/17 1355  BP: 132/83  (!) 145/87 131/80  Pulse: 79  62 67  Resp: 17   18  Temp:  98.8 F (37.1 C) 98.4 F (36.9 C) 98.4 F (36.9 C)  TempSrc:  Oral  Oral  SpO2: 99%   100%  Weight:      Height:        Intake/Output Summary (Last 24 hours) at 01/18/2017 1533 Last data filed at 01/18/2017 1300 Gross per 24 hour  Intake 0 ml  Output -  Net 0 ml   Filed Weights   01/17/17 0750  Weight: 86.6 kg (191 lb)    Examination:  General exam: Appears calm and comfortable, in nad. Respiratory system: Clear to auscultation. Respiratory effort normal. Cardiovascular system: S1 & S2 heard, RRR. No  JVD, murmurs, rubs, gallops or clicks.  Gastrointestinal system: Abdomen is nondistended, soft and nontender. No organomegaly or masses felt. Normal bowel sounds heard. Central nervous system: Alert and oriented. No focal neurological deficits. Extremities: warm, no deformities. Skin: No rashes, lesions or ulcers, on limited exam. Psychiatry: Mood & affect appropriate.   Data Reviewed: I have personally reviewed following labs and imaging studies  CBC: Recent Labs  Lab 01/17/17 0809  WBC 3.0*  HGB 12.6  HCT 38.8  MCV 84.7  PLT 528   Basic Metabolic Panel: Recent Labs  Lab 01/17/17 0809  NA 140  K 3.6  CL 104  CO2 28  GLUCOSE 79  BUN 15  CREATININE 0.90  CALCIUM 9.4   GFR: Estimated Creatinine Clearance: 66.4 mL/min (by C-G formula based on SCr of 0.9 mg/dL). Liver Function Tests: No results for input(s): AST, ALT, ALKPHOS, BILITOT, PROT, ALBUMIN in the last 168 hours. No results for input(s): LIPASE, AMYLASE in the last 168 hours. No results for input(s): AMMONIA in the last 168 hours. Coagulation Profile: No results for input(s): INR, PROTIME in the last 168 hours. Cardiac Enzymes: Recent Labs  Lab 01/17/17 1407 01/17/17 1857 01/18/17 0213  TROPONINI <0.03 <0.03 <0.03   BNP (last 3 results) No results for input(s): PROBNP in the last 8760 hours. HbA1C: No results for input(s): HGBA1C in the  last 72 hours. CBG: No results for input(s): GLUCAP in the last 168 hours. Lipid Profile: No results for input(s): CHOL, HDL, LDLCALC, TRIG, CHOLHDL, LDLDIRECT in the last 72 hours. Thyroid Function Tests: Recent Labs    01/17/17 1407  TSH 0.746   Anemia Panel: No results for input(s): VITAMINB12, FOLATE, FERRITIN, TIBC, IRON, RETICCTPCT in the last 72 hours. Sepsis Labs: No results for input(s): PROCALCITON, LATICACIDVEN in the last 168 hours.  No results found for this or any previous visit (from the past 240 hour(s)).    Radiology Studies: Dg Chest 2  View  Result Date: 01/17/2017 CLINICAL DATA:  Chest pain, tightness x2 days EXAM: CHEST  2 VIEW COMPARISON:  02/16/2016 FINDINGS: Lungs are clear.  No pleural effusion or pneumothorax. The heart is normal in size. Mild degenerative changes of the visualized thoracolumbar spine. IMPRESSION: No evidence of acute cardiopulmonary disease. Electronically Signed   By: Julian Hy M.D.   On: 01/17/2017 08:13     Scheduled Meds: . iopamidol      . aspirin EC  81 mg Oral Daily  . atorvastatin  40 mg Oral q1800  . enoxaparin (LOVENOX) injection  40 mg Subcutaneous Q24H  . famotidine  20 mg Oral BID  . losartan  50 mg Oral Daily   And  . hydrochlorothiazide  12.5 mg Oral Daily  . loratadine  10 mg Oral BID  . montelukast  10 mg Oral QHS  . omega-3 acid ethyl esters  1 g Oral Daily  . pantoprazole  40 mg Oral Daily   Continuous Infusions:   LOS: 0 days    Time spent: > 35 minutes  Velvet Bathe, MD [facility] Pager [phone]  If 7PM-7AM, please contact night-coverage [email or web address] Password [facility] 01/18/2017, 3:33 PM

## 2017-01-19 DIAGNOSIS — R0789 Other chest pain: Principal | ICD-10-CM

## 2017-01-19 LAB — BASIC METABOLIC PANEL
Anion gap: 8 (ref 5–15)
BUN: 15 mg/dL (ref 6–20)
CO2: 27 mmol/L (ref 22–32)
Calcium: 8.9 mg/dL (ref 8.9–10.3)
Chloride: 105 mmol/L (ref 101–111)
Creatinine, Ser: 0.98 mg/dL (ref 0.44–1.00)
GFR calc Af Amer: >60 mL/min (ref >60)
GFR calc non Af Amer: 59 mL/min — ABNORMAL LOW (ref >60)
Glucose, Bld: 111 mg/dL — ABNORMAL HIGH (ref 65–99)
Potassium: 3.3 mmol/L — ABNORMAL LOW (ref 3.5–5.1)
Sodium: 140 mmol/L (ref 135–145)

## 2017-01-19 NOTE — Discharge Summary (Addendum)
Physician Discharge Summary  Leah [name] NGE:[id number] DOB: Aug 07, 1950 DOA: 01/17/2017  PCP: Chipper Herb, MD  Admit date: 01/17/2017 Discharge date: 01/19/2017  Time spent: > 35 minutes  Recommendations for Outpatient Follow-up:  1. Pt states she can't tolerate statins. As such refused script for statin on d/c. States she will follow up with cardiologist for other recommendations.  Discharge Diagnoses:  Principal Problem:   Chest pain Active Problems:   Hyperlipidemia   GERD (gastroesophageal reflux disease)   Asthma   Hypertension, benign essential, goal below 140/90   Discharge Condition: stable  Diet recommendation: Heart healthy  Filed Weights   01/17/17 0750  Weight: 86.6 kg (191 lb)    History of present illness:  67 y.o. female with a hx of HTN, HLD, obesity, former smoker, and GERDwho is being seen today for the evaluation of chest pain.  Hospital Course:  Chest pain  - per cardiology:  1. Chest pain - CT coronary with intermediate to high risk for future cardiac events; nonobstructive mild coronary artery disease - pt is chest pain free today  2. HTN - pressures moderately controlled on home regimen - continue home regimen, may need to titrate losartan a/o HCTZ   3. HLD - lipitor 40 mg started - she states she can't tolerate statins and has failed crestor and lipitor in the past 2/ body aches - she needs referral for evaluation for repatha or PCSK9 inhibitor - pt refused statin script on d/c   Procedures:  stable  Consultations:  Cardiology  Discharge Exam: Vitals:   01/18/17 1926 01/19/17 0353  BP:  (!) 147/80  Pulse:    Resp:  16  Temp: 97.7 F (36.5 C) 97.9 F (36.6 C)  SpO2:  99%    General: Pt in nad, alert and awake Cardiovascular: rrr, no rubs Respiratory: no increased wob, no wheezes  Discharge Instructions     Allergies  Allergen Reactions  . Codeine Nausea And Vomiting  . Penicillins Other (See Comments)     Does not know   Follow-up Information    Charlie Pitter, PA-C Follow up on 02/06/2017.   Specialties:  Cardiology, Radiology Why:  8:00 AM with Melina Copa 8:30 AM with pharmacist  Contact information: [address] [place] [address] [phone]            The results of significant diagnostics from this hospitalization (including imaging, microbiology, ancillary and laboratory) are listed below for reference.    Significant Diagnostic Studies: Dg Chest 2 View  Result Date: 01/17/2017 CLINICAL DATA:  Chest pain, tightness x2 days EXAM: CHEST  2 VIEW COMPARISON:  02/16/2016 FINDINGS: Lungs are clear.  No pleural effusion or pneumothorax. The heart is normal in size. Mild degenerative changes of the visualized thoracolumbar spine. IMPRESSION: No evidence of acute cardiopulmonary disease. Electronically Signed   By: Julian Hy M.D.   On: 01/17/2017 08:13   Ct Coronary Morph W/cta Cor W/score W/ca W/cm &/or Wo/cm  Addendum Date: 01/19/2017   ADDENDUM REPORT: 01/19/2017 08:01 EXAM: OVER-READ INTERPRETATION  CT CHEST The following report is an over-read performed by radiologist Dr. Collene Leyden [facility] on 01/19/2017. This over-read does not include interpretation of cardiac or coronary anatomy or pathology. The coronary CTA interpretation by the cardiologist is attached. COMPARISON:  None. FINDINGS: Visualized aorta is normal caliber. Heart is normal size. No adenopathy in the lower mediastinum or hila. Visualized lungs clear.  No effusions. Imaging into the upper abdomen shows  no acute findings. Chest wall soft tissues are unremarkable. No acute bony abnormality. IMPRESSION: No acute or significant extracardiac abnormality. Electronically Signed   By: Rolm Baptise M.D.   On: 01/19/2017 08:01   Result Date: 01/19/2017 CLINICAL DATA:  Chest pain EXAM: Cardiac CTA MEDICATIONS: Sub lingual nitro .4mg  and lopressor 5mg  I TECHNIQUE: The patient was  scanned on a Siemens 800 slice scanner. Gantry rotation speed was 240 msecs. Collimation was 0.6 mm. A 100 kV prospective scan was triggered in the ascending thoracic aorta at 35-75% of the R-R interval. Average HR during the scan was 60 bpm. The 3D data set was interpreted on a dedicated work station using MPR, MIP and VRT modes. A total of 80cc of contrast was used. FINDINGS: Non-cardiac: See separate report from [facility]. Calcium Score:  83.5 Agatston units. Coronary Arteries: Right dominant with no anomalies LM:  Calcified plaque at the ostium without significant stenosis. LAD system:  Mixed plaque with mild stenosis at the ostium. Circumflex system: Large ramus, mixed plaque with mild stenosis proximally. No plaque or stenosis in the AV LCx. RCA system:  No plaque or stenosis. IMPRESSION: 1. Coronary artery calcium score 83.5 Agatston units. This placed the patient in the 79th percentile for age and gender, suggesting intermediate to high risk for future cardiac events. 2.  Nonobstructive mild coronary disease. 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 On: 01/18/2017 16:58    Microbiology: No results found for this or any previous visit (from the past 240 hour(s)).   Labs: Basic Metabolic Panel: Recent Labs  Lab 01/17/17 0809 01/19/17 0303  NA 140 140  K 3.6 3.3*  CL 104 105  CO2 28 27  GLUCOSE 79 111*  BUN 15 15  CREATININE 0.90 0.98  CALCIUM 9.4 8.9   Liver Function Tests: No results for input(s): AST, ALT, ALKPHOS, BILITOT, PROT, ALBUMIN in the last 168 hours. No results for input(s): LIPASE, AMYLASE in the last 168 hours. No results for input(s): AMMONIA in the last 168 hours. CBC: Recent Labs  Lab 01/17/17 0809  WBC 3.0*  HGB 12.6  HCT 38.8  MCV 84.7  PLT 269   Cardiac Enzymes: Recent Labs  Lab 01/17/17 1407 01/17/17 1857 01/18/17 0213  TROPONINI <0.03 <0.03 <0.03   BNP: BNP (last 3 results) No results for input(s): BNP in the last  8760 hours.  ProBNP (last 3 results) No results for input(s): PROBNP in the last 8760 hours.  CBG: No results for input(s): GLUCAP in the last 168 hours.     Signed:  Velvet Bathe MD.  [facility] 01/19/2017, 1:51 PM

## 2017-01-19 NOTE — Progress Notes (Signed)
Pt given discharge instructions, medication lists, follow up appointments, and when to call the doctor.  Pt verbalizes understanding. Pt resting with call bell within reach.  Will continue to monitor. Payton Emerald, RN

## 2017-01-19 NOTE — Progress Notes (Signed)
Progress Note  Patient Name: Leah [name] Date of Encounter: 01/19/2017  Primary Cardiologist: new - Dr. Angelena Form  Subjective   Pt is feeling much better with only minimal residual pain in her left shoulder. She is ready for discharge. She states she can't tolerate statins. Needs referral for repatha.  Inpatient Medications    Scheduled Meds: . aspirin EC  81 mg Oral Daily  . atorvastatin  40 mg Oral q1800  . enoxaparin (LOVENOX) injection  40 mg Subcutaneous Q24H  . famotidine  20 mg Oral BID  . losartan  50 mg Oral Daily   And  . hydrochlorothiazide  12.5 mg Oral Daily  . loratadine  10 mg Oral BID  . montelukast  10 mg Oral QHS  . omega-3 acid ethyl esters  1 g Oral Daily  . pantoprazole  40 mg Oral Daily   Continuous Infusions:  PRN Meds: acetaminophen, albuterol, calcium carbonate, gi cocktail, hydrALAZINE, morphine injection, nitroGLYCERIN, ondansetron (ZOFRAN) IV   Vital Signs    Vitals:   01/18/17 1200 01/18/17 1355 01/18/17 1926 01/19/17 0353  BP: (!) 145/87 131/80  (!) 147/80  Pulse: 62 67    Resp:  18  16  Temp: 98.4 F (36.9 C) 98.4 F (36.9 C) 97.7 F (36.5 C) 97.9 F (36.6 C)  TempSrc:  Oral Oral Oral  SpO2:  100%  99%  Weight:      Height:        Intake/Output Summary (Last 24 hours) at 01/19/2017 1013 Last data filed at 01/19/2017 0086 Gross per 24 hour  Intake 720 ml  Output 3 ml  Net 717 ml   Filed Weights   01/17/17 0750  Weight: 191 lb (86.6 kg)     Physical Exam   General: Well developed, well nourished, female appearing in no acute distress. Head: Normocephalic, atraumatic.  Neck: Supple without bruits, no JVD. Lungs:  Resp regular and unlabored, CTA. Heart: RRR, S1, S2, no murmur; no rub. Abdomen: Soft, non-tender, non-distended with normoactive bowel sounds. No hepatomegaly. No rebound/guarding. No obvious abdominal masses. Extremities: No clubbing, cyanosis, no edema. Distal pedal pulses are 1+ bilaterally. Neuro: Alert  and oriented X 3. Moves all extremities spontaneously. Psych: Normal affect.  Labs    Chemistry Recent Labs  Lab 01/17/17 0809 01/19/17 0303  NA 140 140  K 3.6 3.3*  CL 104 105  CO2 28 27  GLUCOSE 79 111*  BUN 15 15  CREATININE 0.90 0.98  CALCIUM 9.4 8.9  GFRNONAA >60 59*  GFRAA >60 >60  ANIONGAP 8 8     Hematology Recent Labs  Lab 01/17/17 0809  WBC 3.0*  RBC 4.58  HGB 12.6  HCT 38.8  MCV 84.7  MCH 27.5  MCHC 32.5  RDW 13.9  PLT 269    Cardiac Enzymes Recent Labs  Lab 01/17/17 1407 01/17/17 1857 01/18/17 0213  TROPONINI <0.03 <0.03 <0.03    Recent Labs  Lab 01/17/17 0811 01/17/17 1208  TROPIPOC 0.00 0.00     BNPNo results for input(s): BNP, PROBNP in the last 168 hours.   DDimer No results for input(s): DDIMER in the last 168 hours.   Radiology    Ct Coronary Morph W/cta Cor W/score W/ca W/cm &/or Wo/cm  Addendum Date: 01/19/2017   ADDENDUM REPORT: 01/19/2017 08:01 EXAM: OVER-READ INTERPRETATION  CT CHEST The following report is an over-read performed by radiologist Dr. Collene Leyden [facility] on 01/19/2017. This over-read does not include interpretation of cardiac or coronary  anatomy or pathology. The coronary CTA interpretation by the cardiologist is attached. COMPARISON:  None. FINDINGS: Visualized aorta is normal caliber. Heart is normal size. No adenopathy in the lower mediastinum or hila. Visualized lungs clear.  No effusions. Imaging into the upper abdomen shows no acute findings. Chest wall soft tissues are unremarkable. No acute bony abnormality. IMPRESSION: No acute or significant extracardiac abnormality. Electronically Signed   By: Rolm Baptise M.D.   On: 01/19/2017 08:01   Result Date: 01/19/2017 CLINICAL DATA:  Chest pain EXAM: Cardiac CTA MEDICATIONS: Sub lingual nitro .4mg  and lopressor 5mg  I TECHNIQUE: The patient was scanned on a Siemens 403 slice scanner. Gantry rotation speed was 240 msecs. Collimation was 0.6 mm. A 100 kV  prospective scan was triggered in the ascending thoracic aorta at 35-75% of the R-R interval. Average HR during the scan was 60 bpm. The 3D data set was interpreted on a dedicated work station using MPR, MIP and VRT modes. A total of 80cc of contrast was used. FINDINGS: Non-cardiac: See separate report from [facility]. Calcium Score:  83.5 Agatston units. Coronary Arteries: Right dominant with no anomalies LM:  Calcified plaque at the ostium without significant stenosis. LAD system:  Mixed plaque with mild stenosis at the ostium. Circumflex system: Large ramus, mixed plaque with mild stenosis proximally. No plaque or stenosis in the AV LCx. RCA system:  No plaque or stenosis. IMPRESSION: 1. Coronary artery calcium score 83.5 Agatston units. This placed the patient in the 79th percentile for age and gender, suggesting intermediate to high risk for future cardiac events. 2.  Nonobstructive mild coronary disease. 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 On: 01/18/2017 16:58     Telemetry    sinus - Personally Reviewed  ECG    sinus - Personally Reviewed   Cardiac Studies   CT Coronary 01/18/17: IMPRESSION: 1. Coronary artery calcium score 83.5 Agatston units. This placed the patient in the 79th percentile for age and gender, suggesting intermediate to high risk for future cardiac events.  2.  Nonobstructive mild coronary disease.    Patient Profile     67 y.o. female with a hx of HTN, HLD, obesity, former smoker, and GERD who is being seen today for the evaluation of chest pain  Assessment & Plan    1. Chest pain - CT coronary with intermediate to high risk for future cardiac events; nonobstructive mild coronary artery disease - pt is chest pain free today   2. HTN - pressures moderately controlled on home regimen - continue home regimen, may need to titrate losartan a/o HCTZ   3. HLD - lipitor 40 mg started - she states she can't tolerate statins  and has failed crestor and lipitor in the past 2/ body aches - she needs referral for evaluation for repatha or PCSK9 inhibitor   Signed, Ledora Bottcher , PA-C 10:13 AM 01/19/2017 Pager: [phone]  I have personally seen and examined this patient. I agree with the assessment and plan as outlined above. Her chest pain is resolved. Coronary CTA with mild CAD. No further cardiac workup is necessary. We will arrange f/u in our office and f/u in our lipid clinic. OK to d/c home today.   Lauree Chandler 01/19/2017 11:19 AM

## 2017-02-02 ENCOUNTER — Other Ambulatory Visit: Payer: Self-pay | Admitting: Surgery

## 2017-02-02 DIAGNOSIS — D171 Benign lipomatous neoplasm of skin and subcutaneous tissue of trunk: Secondary | ICD-10-CM | POA: Diagnosis not present

## 2017-02-05 ENCOUNTER — Ambulatory Visit: Payer: BLUE CROSS/BLUE SHIELD | Admitting: Gastroenterology

## 2017-02-06 ENCOUNTER — Ambulatory Visit: Payer: PRIVATE HEALTH INSURANCE

## 2017-02-12 ENCOUNTER — Ambulatory Visit
Admission: RE | Admit: 2017-02-12 | Discharge: 2017-02-12 | Disposition: A | Discharge status: Home / Self Care | Payer: Medicare Other | Source: Ambulatory Visit | Attending: Family Medicine | Admitting: Family Medicine

## 2017-02-12 ENCOUNTER — Other Ambulatory Visit: Payer: Self-pay | Admitting: Family Medicine

## 2017-02-12 DIAGNOSIS — Z1231 Encounter for screening mammogram for malignant neoplasm of breast: Secondary | ICD-10-CM

## 2017-02-20 ENCOUNTER — Encounter (INDEPENDENT_AMBULATORY_CARE_PROVIDER_SITE_OTHER): Payer: Self-pay

## 2017-02-20 ENCOUNTER — Ambulatory Visit (INDEPENDENT_AMBULATORY_CARE_PROVIDER_SITE_OTHER): Payer: Medicare Other | Admitting: Pharmacist

## 2017-02-20 ENCOUNTER — Encounter: Payer: Self-pay | Admitting: Pharmacist

## 2017-02-20 ENCOUNTER — Ambulatory Visit (INDEPENDENT_AMBULATORY_CARE_PROVIDER_SITE_OTHER): Payer: Medicare Other | Admitting: Physician Assistant

## 2017-02-20 ENCOUNTER — Encounter: Payer: Self-pay | Admitting: Physician Assistant

## 2017-02-20 VITALS — BP 140/76 | HR 96 | Ht 63.0 in | Wt 192.0 lb

## 2017-02-20 DIAGNOSIS — R072 Precordial pain: Secondary | ICD-10-CM

## 2017-02-20 DIAGNOSIS — E782 Mixed hyperlipidemia: Secondary | ICD-10-CM | POA: Diagnosis not present

## 2017-02-20 DIAGNOSIS — I10 Essential (primary) hypertension: Secondary | ICD-10-CM | POA: Diagnosis not present

## 2017-02-20 DIAGNOSIS — E876 Hypokalemia: Secondary | ICD-10-CM

## 2017-02-20 MED ORDER — LISINOPRIL-HYDROCHLOROTHIAZIDE 20-12.5 MG PO TABS: 1.0000 | ORAL_TABLET | Freq: Every day | ORAL | 3 refills | Status: DC

## 2017-02-20 NOTE — Progress Notes (Signed)
Patient ID: OMEGA DURANTE                 DOB: [birth year]                    MRN: [id number]     HPI: Leah [name] is a 68 y.o. female patient of Dr. Angelena Form that presents today for lipid evaluation.  PMH includes HTN and she was recently admitted to hospital for angina. She has been statin difficult in the past due to muscle aching on several different statins. Coronary CT was intermediate to high risk for future cardiac events with nonobstructive mild CAD.  Upon discharge she refused statin RX due to history of muscle aches.   She presents today for discussion of lipids. She reports that each time she tries a statin medication her muscles hurt so badly that she is unable to sleep. When she tried Lipitor she recalled feeling as if she was going to die. She does not recalled trying zetia.   Risk Factors: CAD with angina, HTN, AHA risk assessment/estimator: 7.5% LDL Goal: <70   Current Medications: Fish oil 1g dialy  Intolerances: Atorvastatin 40mg  in 2015 - caused muscle aches so bad that unable to sleep             Livalo 2mg  took in 2014 - stopped due to cost and she felt had increased BP             Rosuvastatin 10mg  and 20mg  tried on different occasions.  Both times patient experienced myalgias.   Pravastatin 40mg  daily - muscle aches  Muscle aches all resolved when medications discontinued   Diet: Most meals from out. She avoids fast food. She normally goes to sit down restaurants where she can get vegetables. She does eat fried chicken occasionally. She eats a lot of salmon. She cannot eat pork or beef due to tick bite. She drinks oceanspracy mango cranapple juice. She does drink spite and pink lemonade.   Exercise: She works out every day. She 15 minutes on treadmill or elliptical daily. She does the stair stepper for about a minute or so.   Family History: Mother with high cholesterol, her sisters and brothers with high cholesterol, her younger brother had MI and stroke in late  38s  Social History: Former smoker Quit in 1997. 1-2 glasses of wine per week.   Labs: 12/18/16:  TC 267, TG 112, HDL 50, LDL 195 (Fish oil 1 g daily)  Past Medical History:  Diagnosis Date  . Asthma   . Colon polyp   . Depression   . Food allergy   . GERD (gastroesophageal reflux disease)   . Hyperlipidemia   . Hypertension   . Urticaria     Current Outpatient Medications on File Prior to Visit  Medication Sig Dispense Refill  . albuterol (PROVENTIL HFA;VENTOLIN HFA) 108 (90 Base) MCG/ACT inhaler Inhale 2 puffs into the lungs every 6 (six) hours as needed for wheezing or shortness of breath. 1 Inhaler 6  . albuterol (PROVENTIL) (2.5 MG/3ML) 0.083% nebulizer solution Take 3 mLs (2.5 mg total) by nebulization every 6 (six) hours as needed for wheezing or shortness of breath. 150 mL 6  . aspirin 81 MG tablet Take 81 mg by mouth daily.    . calcium carbonate (TUMS) 500 MG chewable tablet Chew 1 tablet (200 mg of elemental calcium total) by mouth as needed for indigestion or heartburn.    . Cholecalciferol (VITAMIN D) 2000 UNITS tablet  Take 2,000 Units by mouth daily.    Marland Kitchen EPINEPHrine (EPIPEN 2-PAK) 0.3 mg/0.3 mL IJ SOAJ injection Inject into the muscle once.    . fish oil-omega-3 fatty acids 1000 MG capsule Take 1 g by mouth daily.     Marland Kitchen loratadine (CLARITIN) 10 MG tablet Take 10 mg by mouth 2 (two) times daily.    Marland Kitchen losartan-hydrochlorothiazide (HYZAAR) 50-12.5 MG tablet Take 1 tablet daily by mouth. 90 tablet 3  . montelukast (SINGULAIR) 10 MG tablet Take 1 tablet (10 mg total) by mouth at bedtime. 30 tablet 5  . omeprazole (PRILOSEC) 40 MG capsule Take 1 capsule (40 mg total) by mouth every morning. 30 capsule 5  . ranitidine (ZANTAC) 150 MG tablet Take 150 mg by mouth 2 (two) times daily.     No current facility-administered medications on file prior to visit.     Allergies  Allergen Reactions  . Codeine Nausea And Vomiting  . Penicillins Other (See Comments)    Does not know     Assessment/Plan: Hyperlipidemia: LDL not at goal. Aggressive risk modification warranted with CAD with anginal symptoms. Patient does not wish to try another statin medication due to history of muscle aches. She also wishes to avoid Zetia due to previous issues with her GI system and this medication will not likely get her LDL to goal. Will pursue PCSK9i therapy. She has medicare and believes that medication may be cost-prohibitive for her. Had her fill out patient assistance paperwork today.    Thank you,  Lelan Pons. Patterson Hammersmith, [facility]  02/20/2017 7:42 AM

## 2017-02-20 NOTE — Progress Notes (Signed)
Cardiology Office Note    Date:  02/20/2017   ID:  Leah, [name] 1950-01-26, MRN [id number]  PCP:  Chipper Herb, MD  Cardiologist: Lauree Chandler, MD  No chief complaint on file.   History of Present Illness:  Leah [name] is a 67 y.o. female who was seen in the hospital for chest pain.  Troponins negative x3 EKG without acute change.  Coronary CT was intermediate to high risk for future cardiac events with nonobstructive mild CAD.  She also has hypertension and HLD.  Dr. Angelena Form evaluated her and did not feel like any further cardiac workup was necessary.  She was referred to the lipid clinic for [place] as she has been intolerant to statins.  Patient comes in today for follow-up.  She complains of her blood pressure being high.  She does not feel the losartan is controlling it as much as a lisinopril.  Lisinopril was stopped when she had a tickborne illness and developed hives.  She does not think it was related to the lisinopril would like to try it again.  She is not following a low-sodium diet but said she will try.  She denies any further chest pain.  She has an appointment with the lipid clinic today.  She does complain of severe cramping in her legs that wakes her from sleep.  She gets relief from drinking pickle juice or mustard.  Her potassium was 3.3 in the hospital.  She exercises daily and works in her yard without symptoms.    Past Medical History:  Diagnosis Date  . Asthma   . Colon polyp   . Depression   . Food allergy   . GERD (gastroesophageal reflux disease)   . Hyperlipidemia   . Hypertension   . Urticaria     Past Surgical History:  Procedure Laterality Date  . ABDOMINAL HYSTERECTOMY    . Carpel Tunnel    . CHOLECYSTECTOMY    . COLONOSCOPY  02/08/2005   GEX:[id number] hemorrhoids otherwise normal rectum/ A few scattered, shallow, left-sided sigmoid diverticula/The remainder of the colonic mucosa appeared normal  . COLONOSCOPY WITH  ESOPHAGOGASTRODUODENOSCOPY (EGD) N/A 11/29/2012   Procedure: COLONOSCOPY WITH ESOPHAGOGASTRODUODENOSCOPY (EGD);  Surgeon: Daneil Dolin, MD;  Location: [facility];  Service: Endoscopy;  Laterality: N/A;  10:45  . HERNIA REPAIR     ventral X 2 with mesh, last one 2011 ([place]). complicated with 2 week hospital stay  . NISSEN FUNDOPLICATION  4010  . TUBAL LIGATION      Current Medications: Current Meds  Medication Sig  . albuterol (PROVENTIL HFA;VENTOLIN HFA) 108 (90 Base) MCG/ACT inhaler Inhale 2 puffs into the lungs every 6 (six) hours as needed for wheezing or shortness of breath.  Marland Kitchen albuterol (PROVENTIL) (2.5 MG/3ML) 0.083% nebulizer solution Take 3 mLs (2.5 mg total) by nebulization every 6 (six) hours as needed for wheezing or shortness of breath.  Marland Kitchen aspirin 81 MG tablet Take 81 mg by mouth daily.  . calcium carbonate (TUMS) 500 MG chewable tablet Chew 1 tablet (200 mg of elemental calcium total) by mouth as needed for indigestion or heartburn.  . Cholecalciferol (VITAMIN D) 2000 UNITS tablet Take 2,000 Units by mouth daily.  Marland Kitchen EPINEPHrine (EPIPEN 2-PAK) 0.3 mg/0.3 mL IJ SOAJ injection Inject into the muscle once.  . fish oil-omega-3 fatty acids 1000 MG capsule Take 1 g by mouth daily.   Marland Kitchen loratadine (CLARITIN) 10 MG tablet Take 10 mg by mouth 2 (two) times daily.  Marland Kitchen  montelukast (SINGULAIR) 10 MG tablet Take 1 tablet (10 mg total) by mouth at bedtime.  Marland Kitchen omeprazole (PRILOSEC) 40 MG capsule Take 1 capsule (40 mg total) by mouth every morning.  . ranitidine (ZANTAC) 150 MG tablet Take 150 mg by mouth 2 (two) times daily.  . [DISCONTINUED] losartan-hydrochlorothiazide (HYZAAR) 50-12.5 MG tablet Take 1 tablet daily by mouth.     Allergies:   Codeine and Penicillins   Social History   Socioeconomic History  . Marital status: Divorced    Spouse name: None  . Number of children: 3  . Years of education: None  . Highest education level: None  Social Needs  . Financial  resource strain: None  . Food insecurity - worry: None  . Food insecurity - inability: None  . Transportation needs - medical: None  . Transportation needs - non-medical: None  Occupational History  . Occupation: private duty Manufacturing systems engineer  Tobacco Use  . Smoking status: Former Smoker    Packs/day: 0.50    Years: 28.00    Pack years: 14.00    Types: Cigarettes    Last attempt to quit: 01/17/1996    Years since quitting: 21.1  . Smokeless tobacco: Never Used  Substance and Sexual Activity  . Alcohol use: Yes    Alcohol/week: 0.0 oz    Comment: occasional use of wine   . Drug use: No  . Sexual activity: None  Other Topics Concern  . None  Social History Narrative  . None     Family History:  The patient's family history includes Alzheimer's disease in her mother; Cancer in her cousin; Diabetes in her brother, mother, and sister; Heart disease in her mother; Other in her father.   ROS:   Please see the history of present illness.    Review of Systems  Constitution: Negative.  HENT: Negative.   Eyes: Negative.   Cardiovascular: Negative.   Respiratory: Negative.   Hematologic/Lymphatic: Negative.   Musculoskeletal: Positive for myalgias. Negative for joint pain.  Gastrointestinal: Negative.   Genitourinary: Negative.   Neurological: Negative.    All other systems reviewed and are negative.   PHYSICAL EXAM:   VS:  BP 140/76   Pulse 96   Ht 5\' 3"  (1.6 m)   Wt 192 lb (87.1 kg)   SpO2 97%   BMI 34.01 kg/m   Physical Exam  GEN: Well nourished, well developed, in no acute distress  Neck: no JVD, carotid bruits, or masses Cardiac:RRR; no murmurs, rubs, or gallops  Respiratory:  clear to auscultation bilaterally, normal work of breathing GI: soft, nontender, nondistended, + BS Ext: without cyanosis, clubbing, or edema, Good distal pulses bilaterally Neuro:  Alert and Oriented x 3 Psych: euthymic mood, full affect  Wt Readings from Last 3 Encounters:  02/20/17 192  lb (87.1 kg)  01/17/17 191 lb (86.6 kg)  01/04/17 191 lb (86.6 kg)      Studies/Labs Reviewed:   EKG:  EKG is not ordered today.  Recent Labs: 12/18/2016: ALT 43 01/17/2017: Hemoglobin 12.6; Platelets 269; TSH 0.746 01/19/2017: BUN 15; Creatinine, Ser 0.98; Potassium 3.3; Sodium 140   Lipid Panel    Component Value Date/Time   CHOL 267 (H) 12/18/2016 0812   TRIG 112 12/18/2016 0812   TRIG 79 02/17/2016 0817   HDL 50 12/18/2016 0812   HDL 53 02/17/2016 0817   CHOLHDL 5.3 (H) 12/18/2016 0812   CHOLHDL 3.5 06/11/2007 0450   VLDL 20 06/11/2007 0450   LDLCALC 195 (H) 12/18/2016  [place] 171 (H) 08/25/2013 0810    Additional studies/ records that were reviewed today include:  ADDENDUM REPORT: 01/19/2017 08:01   EXAM: OVER-READ INTERPRETATION  CT CHEST   The following report is an over-read performed by radiologist Dr. Collene Leyden [facility] on 01/19/2017. This over-read does not include interpretation of cardiac or coronary anatomy or pathology. The coronary CTA interpretation by the cardiologist is attached.   COMPARISON:  None.   FINDINGS: Visualized aorta is normal caliber. Heart is normal size. No adenopathy in the lower mediastinum or hila.   Visualized lungs clear.  No effusions.   Imaging into the upper abdomen shows no acute findings. Chest wall soft tissues are unremarkable. No acute bony abnormality.   IMPRESSION: No acute or significant extracardiac abnormality.     FINDINGS: Non-cardiac: See separate report from [facility].   Calcium Score:  83.5 Agatston units.   Coronary Arteries: Right dominant with no anomalies   LM:  Calcified plaque at the ostium without significant stenosis.   LAD system:  Mixed plaque with mild stenosis at the ostium.   Circumflex system: Large ramus, mixed plaque with mild stenosis proximally. No plaque or stenosis in the AV LCx.   RCA system:  No plaque or stenosis.   IMPRESSION: 1.  Coronary artery calcium score 83.5 Agatston units. This placed the patient in the 79th percentile for age and gender, suggesting intermediate to high risk for future cardiac events.   2.  Nonobstructive mild coronary disease.   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 On: 01/18/2017 16:58       ASSESSMENT:    1. Precordial pain   2. Hypertension, benign essential, goal below 140/90   3. Mixed hyperlipidemia   4. Hypokalemia      PLAN:  In order of problems listed above:  Chest pain ruled out for an MI in the hospital coronary CT intermediate to high risk for future cardiac event with nonobstructive mild CAD.  No further chest pain.  Risk factor modification essential.  Follow-up with Dr. Angelena Form in 2-3 months.  Hypertension blood pressure has been running high.  She wants to try lisinopril HCTZ again.  Will stop losartan and begin lisinopril 20/12.5 mg daily.  2 g sodium diet reiterated.  She is also gained weight so weight loss would help.  She is to call if her blood pressure remains elevated.  Hyperlipidemia intolerant to statins referred to lipid clinic for [place] and is seeing them today.  Leg cramps with hypokalemia in the hospital.  Will recheck today.  Medication Adjustments/Labs and Tests Ordered: Current medicines are reviewed at length with the patient today.  Concerns regarding medicines are outlined above.  Medication changes, Labs and Tests ordered today are listed in the Patient Instructions below. Patient Instructions  Medication Instructions:   Your physician has recommended you make the following change in your medication:   1. STOP: losartan-hydrochlorothiazide  2. START: lisinopril-hydrochlorothiazide 20-12.5 mg (1 tablet) once daily   Labwork: TODAY: BMET  Testing/Procedures: None ordered  Follow-Up: Your physician recommends that you schedule a follow-up appointment in: 2-3 months with Dr. Angelena Form   Any Other  Special Instructions Will Be Listed Below (If Applicable).  CALL us WITH YOU BLOOD PRESSURE READINGS    Low-Sodium Eating Plan Sodium, which is an element that makes up salt, helps you maintain a healthy balance of fluids in your body. Too much sodium can increase your blood pressure and cause  fluid and waste to be held in your body. Your health care provider or dietitian may recommend following this plan if you have high blood pressure (hypertension), kidney disease, liver disease, or heart failure. Eating less sodium can help lower your blood pressure, reduce swelling, and protect your heart, liver, and kidneys. What are tips for following this plan? General guidelines  Most people on this plan should limit their sodium intake to 1,500-2,000 mg (milligrams) of sodium each day. Reading food labels  The Nutrition Facts label lists the amount of sodium in one serving of the food. If you eat more than one serving, you must multiply the listed amount of sodium by the number of servings.  Choose foods with less than 140 mg of sodium per serving.  Avoid foods with 300 mg of sodium or more per serving. Shopping  Look for lower-sodium products, often labeled as "low-sodium" or "no salt added."  Always check the sodium content even if foods are labeled as "unsalted" or "no salt added".  Buy fresh foods. ? Avoid canned foods and premade or frozen meals. ? Avoid canned, cured, or processed meats  Buy breads that have less than 80 mg of sodium per slice. Cooking  Eat more home-cooked food and less restaurant, buffet, and fast food.  Avoid adding salt when cooking. Use salt-free seasonings or herbs instead of table salt or sea salt. Check with your health care provider or pharmacist before using salt substitutes.  Cook with plant-based oils, such as canola, sunflower, or olive oil. Meal planning  When eating at a restaurant, ask that your food be prepared with less salt or no salt, if  possible.  Avoid foods that contain MSG (monosodium glutamate). MSG is sometimes added to [place] food, bouillon, and some canned foods. What foods are recommended? The items listed may not be a complete list. Talk with your dietitian about what dietary choices are best for you. Grains Low-sodium cereals, including oats, puffed wheat and rice, and shredded wheat. Low-sodium crackers. Unsalted rice. Unsalted pasta. Low-sodium bread. Whole-grain breads and whole-grain pasta. Vegetables Fresh or frozen vegetables. "No salt added" canned vegetables. "No salt added" tomato sauce and paste. Low-sodium or reduced-sodium tomato and vegetable juice. Fruits Fresh, frozen, or canned fruit. Fruit juice. Meats and other protein foods Fresh or frozen (no salt added) meat, poultry, seafood, and fish. Low-sodium canned tuna and salmon. Unsalted nuts. Dried peas, beans, and lentils without added salt. Unsalted canned beans. Eggs. Unsalted nut butters. Dairy Milk. Soy milk. Cheese that is naturally low in sodium, such as ricotta cheese, fresh mozzarella, or Swiss cheese Low-sodium or reduced-sodium cheese. Cream cheese. Yogurt. Fats and oils Unsalted butter. Unsalted margarine with no trans fat. Vegetable oils such as canola or olive oils. Seasonings and other foods Fresh and dried herbs and spices. Salt-free seasonings. Low-sodium mustard and ketchup. Sodium-free salad dressing. Sodium-free light mayonnaise. Fresh or refrigerated horseradish. Lemon juice. Vinegar. Homemade, reduced-sodium, or low-sodium soups. Unsalted popcorn and pretzels. Low-salt or salt-free chips. What foods are not recommended? The items listed may not be a complete list. Talk with your dietitian about what dietary choices are best for you. Grains Instant hot cereals. Bread stuffing, pancake, and biscuit mixes. Croutons. Seasoned rice or pasta mixes. Noodle soup cups. Boxed or frozen macaroni and cheese. Regular salted crackers.  Self-rising flour. Vegetables Sauerkraut, pickled vegetables, and relishes. Olives. [place] fries. Onion rings. Regular canned vegetables (not low-sodium or reduced-sodium). Regular canned tomato sauce and paste (not low-sodium or reduced-sodium). Regular tomato  and vegetable juice (not low-sodium or reduced-sodium). Frozen vegetables in sauces. Meats and other protein foods Meat or fish that is salted, canned, smoked, spiced, or pickled. Bacon, ham, sausage, hotdogs, corned beef, chipped beef, packaged lunch meats, salt pork, jerky, pickled herring, anchovies, regular canned tuna, sardines, salted nuts. Dairy Processed cheese and cheese spreads. Cheese curds. Blue cheese. Feta cheese. String cheese. Regular cottage cheese. Buttermilk. Canned milk. Fats and oils Salted butter. Regular margarine. Ghee. Bacon fat. Seasonings and other foods Onion salt, garlic salt, seasoned salt, table salt, and sea salt. Canned and packaged gravies. Worcestershire sauce. Tartar sauce. Barbecue sauce. Teriyaki sauce. Soy sauce, including reduced-sodium. Steak sauce. Fish sauce. Oyster sauce. Cocktail sauce. Horseradish that you find on the shelf. Regular ketchup and mustard. Meat flavorings and tenderizers. Bouillon cubes. Hot sauce and Tabasco sauce. Premade or packaged marinades. Premade or packaged taco seasonings. Relishes. Regular salad dressings. Salsa. Potato and tortilla chips. Corn chips and puffs. Salted popcorn and pretzels. Canned or dried soups. Pizza. Frozen entrees and pot pies. Summary  Eating less sodium can help lower your blood pressure, reduce swelling, and protect your heart, liver, and kidneys.  Most people on this plan should limit their sodium intake to 1,500-2,000 mg (milligrams) of sodium each day.  Canned, boxed, and frozen foods are high in sodium. Restaurant foods, fast foods, and pizza are also very high in sodium. You also get sodium by adding salt to food.  Try to cook at home, eat  more fresh fruits and vegetables, and eat less fast food, canned, processed, or prepared foods. This information is not intended to replace advice given to you by your health care provider. Make sure you discuss any questions you have with your health care provider. Document Released: 06/24/2001 Document Revised: 12/27/2015 Document Reviewed: 12/27/2015 Elsevier Interactive Patient Education  [employer].     If you need a refill on your cardiac medications before your next appointment, please call your pharmacy.     Signed, Ermalinda Barrios, PA-C  02/20/2017 2:35 PM    [facility] [address], [place], [place]  [address] Phone: [phone]; Fax: [phone]

## 2017-02-20 NOTE — Patient Instructions (Signed)
We will send for PCSK9 inhibitor therapy (Praluent)  Once we obtain coverage through your insurance we will send the patient assistance information. If you receive the medication from the patient assistance foundation before we contact you please call our office so that labs can be ordered. [phone].    Cholesterol Cholesterol is a fat. Your body needs a small amount of cholesterol. Cholesterol (plaque) may build up in your blood vessels (arteries). That makes you more likely to have a heart attack or stroke. You cannot feel your cholesterol level. Having a blood test is the only way to find out if your level is high. Keep your test results. Work with your doctor to keep your cholesterol at a good level. What do the results mean?  Total cholesterol is how much cholesterol is in your blood.  LDL is bad cholesterol. This is the type that can build up. Try to have low LDL.  HDL is good cholesterol. It cleans your blood vessels and carries LDL away. Try to have high HDL.  Triglycerides are fat that the body can store or burn for energy. What are good levels of cholesterol?  Total cholesterol below 200.  LDL below 100 is good for people who have health risks. LDL below 70 is good for people who have very high risks.  HDL above 40 is good. It is best to have HDL of 60 or higher.  Triglycerides below 150. How can I lower my cholesterol? Diet Follow your diet program as told by your doctor.  Choose fish, white meat chicken, or [place] that is roasted or baked. Try not to eat red meat, fried foods, sausage, or lunch meats.  Eat lots of fresh fruits and vegetables.  Choose whole grains, beans, pasta, potatoes, and cereals.  Choose olive oil, corn oil, or canola oil. Only use small amounts.  Try not to eat butter, mayonnaise, shortening, or palm kernel oils.  Try not to eat foods with trans fats.  Choose low-fat or nonfat dairy foods. ? Drink skim or nonfat milk. ? Eat low-fat or  nonfat yogurt and cheeses. ? Try not to drink whole milk or cream. ? Try not to eat ice cream, egg yolks, or full-fat cheeses.  Healthy desserts include angel food cake, ginger snaps, animal crackers, hard candy, popsicles, and low-fat or nonfat frozen yogurt. Try not to eat pastries, cakes, pies, and cookies.  Exercise Follow your exercise program as told by your doctor.  Be more active. Try gardening, walking, and taking the stairs.  Ask your doctor about ways that you can be more active.  Medicine  Take over-the-counter and prescription medicines only as told by your doctor. This information is not intended to replace advice given to you by your health care provider. Make sure you discuss any questions you have with your health care provider. Document Released: 03/31/2008 Document Revised: 08/04/2015 Document Reviewed: 07/15/2015 Elsevier Interactive Patient Education  [employer].

## 2017-02-20 NOTE — Patient Instructions (Addendum)
Medication Instructions:   Your physician has recommended you make the following change in your medication:   1. STOP: losartan-hydrochlorothiazide  2. START: lisinopril-hydrochlorothiazide 20-12.5 mg (1 tablet) once daily   Labwork: TODAY: BMET  Testing/Procedures: None ordered  Follow-Up: Your physician recommends that you schedule a follow-up appointment in: 2-3 months with Dr. Angelena Form   Any Other Special Instructions Will Be Listed Below (If Applicable).  CALL us WITH YOU BLOOD PRESSURE READINGS    Low-Sodium Eating Plan Sodium, which is an element that makes up salt, helps you maintain a healthy balance of fluids in your body. Too much sodium can increase your blood pressure and cause fluid and waste to be held in your body. Your health care provider or dietitian may recommend following this plan if you have high blood pressure (hypertension), kidney disease, liver disease, or heart failure. Eating less sodium can help lower your blood pressure, reduce swelling, and protect your heart, liver, and kidneys. What are tips for following this plan? General guidelines  Most people on this plan should limit their sodium intake to 1,500-2,000 mg (milligrams) of sodium each day. Reading food labels  The Nutrition Facts label lists the amount of sodium in one serving of the food. If you eat more than one serving, you must multiply the listed amount of sodium by the number of servings.  Choose foods with less than 140 mg of sodium per serving.  Avoid foods with 300 mg of sodium or more per serving. Shopping  Look for lower-sodium products, often labeled as "low-sodium" or "no salt added."  Always check the sodium content even if foods are labeled as "unsalted" or "no salt added".  Buy fresh foods. ? Avoid canned foods and premade or frozen meals. ? Avoid canned, cured, or processed meats  Buy breads that have less than 80 mg of sodium per slice. Cooking  Eat more  home-cooked food and less restaurant, buffet, and fast food.  Avoid adding salt when cooking. Use salt-free seasonings or herbs instead of table salt or sea salt. Check with your health care provider or pharmacist before using salt substitutes.  Cook with plant-based oils, such as canola, sunflower, or olive oil. Meal planning  When eating at a restaurant, ask that your food be prepared with less salt or no salt, if possible.  Avoid foods that contain MSG (monosodium glutamate). MSG is sometimes added to [place] food, bouillon, and some canned foods. What foods are recommended? The items listed may not be a complete list. Talk with your dietitian about what dietary choices are best for you. Grains Low-sodium cereals, including oats, puffed wheat and rice, and shredded wheat. Low-sodium crackers. Unsalted rice. Unsalted pasta. Low-sodium bread. Whole-grain breads and whole-grain pasta. Vegetables Fresh or frozen vegetables. "No salt added" canned vegetables. "No salt added" tomato sauce and paste. Low-sodium or reduced-sodium tomato and vegetable juice. Fruits Fresh, frozen, or canned fruit. Fruit juice. Meats and other protein foods Fresh or frozen (no salt added) meat, poultry, seafood, and fish. Low-sodium canned tuna and salmon. Unsalted nuts. Dried peas, beans, and lentils without added salt. Unsalted canned beans. Eggs. Unsalted nut butters. Dairy Milk. Soy milk. Cheese that is naturally low in sodium, such as ricotta cheese, fresh mozzarella, or Swiss cheese Low-sodium or reduced-sodium cheese. Cream cheese. Yogurt. Fats and oils Unsalted butter. Unsalted margarine with no trans fat. Vegetable oils such as canola or olive oils. Seasonings and other foods Fresh and dried herbs and spices. Salt-free seasonings. Low-sodium mustard and ketchup.  Sodium-free salad dressing. Sodium-free light mayonnaise. Fresh or refrigerated horseradish. Lemon juice. Vinegar. Homemade, reduced-sodium, or  low-sodium soups. Unsalted popcorn and pretzels. Low-salt or salt-free chips. What foods are not recommended? The items listed may not be a complete list. Talk with your dietitian about what dietary choices are best for you. Grains Instant hot cereals. Bread stuffing, pancake, and biscuit mixes. Croutons. Seasoned rice or pasta mixes. Noodle soup cups. Boxed or frozen macaroni and cheese. Regular salted crackers. Self-rising flour. Vegetables Sauerkraut, pickled vegetables, and relishes. Olives. [place] fries. Onion rings. Regular canned vegetables (not low-sodium or reduced-sodium). Regular canned tomato sauce and paste (not low-sodium or reduced-sodium). Regular tomato and vegetable juice (not low-sodium or reduced-sodium). Frozen vegetables in sauces. Meats and other protein foods Meat or fish that is salted, canned, smoked, spiced, or pickled. Bacon, ham, sausage, hotdogs, corned beef, chipped beef, packaged lunch meats, salt pork, jerky, pickled herring, anchovies, regular canned tuna, sardines, salted nuts. Dairy Processed cheese and cheese spreads. Cheese curds. Blue cheese. Feta cheese. String cheese. Regular cottage cheese. Buttermilk. Canned milk. Fats and oils Salted butter. Regular margarine. Ghee. Bacon fat. Seasonings and other foods Onion salt, garlic salt, seasoned salt, table salt, and sea salt. Canned and packaged gravies. Worcestershire sauce. Tartar sauce. Barbecue sauce. Teriyaki sauce. Soy sauce, including reduced-sodium. Steak sauce. Fish sauce. Oyster sauce. Cocktail sauce. Horseradish that you find on the shelf. Regular ketchup and mustard. Meat flavorings and tenderizers. Bouillon cubes. Hot sauce and Tabasco sauce. Premade or packaged marinades. Premade or packaged taco seasonings. Relishes. Regular salad dressings. Salsa. Potato and tortilla chips. Corn chips and puffs. Salted popcorn and pretzels. Canned or dried soups. Pizza. Frozen entrees and pot pies. Summary  Eating  less sodium can help lower your blood pressure, reduce swelling, and protect your heart, liver, and kidneys.  Most people on this plan should limit their sodium intake to 1,500-2,000 mg (milligrams) of sodium each day.  Canned, boxed, and frozen foods are high in sodium. Restaurant foods, fast foods, and pizza are also very high in sodium. You also get sodium by adding salt to food.  Try to cook at home, eat more fresh fruits and vegetables, and eat less fast food, canned, processed, or prepared foods. This information is not intended to replace advice given to you by your health care provider. Make sure you discuss any questions you have with your health care provider. Document Released: 06/24/2001 Document Revised: 12/27/2015 Document Reviewed: 12/27/2015 Elsevier Interactive Patient Education  [employer].     If you need a refill on your cardiac medications before your next appointment, please call your pharmacy.

## 2017-02-21 LAB — BASIC METABOLIC PANEL
BUN/Creatinine Ratio: 18 (ref 12–28)
BUN: 17 mg/dL (ref 8–27)
CO2: 24 mmol/L (ref 20–29)
Calcium: 9.4 mg/dL (ref 8.7–10.3)
Chloride: 103 mmol/L (ref 96–106)
Creatinine, Ser: 0.96 mg/dL (ref 0.57–1.00)
GFR calc Af Amer: 72 mL/min/{1.73_m2} (ref >59)
GFR calc non Af Amer: 62 mL/min/{1.73_m2} (ref >59)
Glucose: 98 mg/dL (ref 65–99)
Potassium: 4.1 mmol/L (ref 3.5–5.2)
Sodium: 141 mmol/L (ref 134–144)

## 2017-02-22 ENCOUNTER — Telehealth: Payer: Self-pay | Admitting: Physician Assistant

## 2017-02-22 NOTE — Telephone Encounter (Signed)
Patient returning call for lab results. 

## 2017-02-22 NOTE — Telephone Encounter (Signed)
-----   Message from Imogene Burn, PA-C sent at 02/21/2017  8:01 AM EST ----- Labs are normal potassium perfect.

## 2017-02-22 NOTE — Telephone Encounter (Signed)
Returned pts call and she has been made aware of her lab results. See result note. 

## 2017-02-23 ENCOUNTER — Telehealth: Payer: Self-pay | Admitting: *Deleted

## 2017-02-23 NOTE — Telephone Encounter (Signed)
Received message that pt needed a 2-3 month follow up appointment with Dr. Angelena Form.  I placed call to pt and left message to call back

## 2017-02-23 NOTE — Telephone Encounter (Signed)
I spoke with Leah [name] and scheduled her to see Dr. Angelena Form on 4/15 at 1:40

## 2017-03-07 ENCOUNTER — Encounter: Payer: Self-pay | Admitting: Family Medicine

## 2017-03-07 ENCOUNTER — Ambulatory Visit (INDEPENDENT_AMBULATORY_CARE_PROVIDER_SITE_OTHER): Payer: Medicare Other | Admitting: Family Medicine

## 2017-03-07 VITALS — BP 117/79 | HR 91 | Temp 99.1°F | Ht 63.0 in | Wt 187.0 lb

## 2017-03-07 DIAGNOSIS — R52 Pain, unspecified: Secondary | ICD-10-CM

## 2017-03-07 DIAGNOSIS — J4521 Mild intermittent asthma with (acute) exacerbation: Secondary | ICD-10-CM

## 2017-03-07 LAB — VERITOR FLU A/B WAIVED
Influenza A: NEGATIVE
Influenza B: NEGATIVE

## 2017-03-07 MED ORDER — BENZONATATE 200 MG PO CAPS: 200.0000 mg | ORAL_CAPSULE | Freq: Two times a day (BID) | ORAL | 0 refills | Status: DC | PRN

## 2017-03-07 MED ORDER — PREDNISONE 20 MG PO TABS: 40.0000 mg | ORAL_TABLET | Freq: Every day | ORAL | 0 refills | Status: DC

## 2017-03-07 MED ORDER — AZITHROMYCIN 250 MG PO TABS: ORAL_TABLET | ORAL | 0 refills | Status: DC

## 2017-03-07 NOTE — Patient Instructions (Signed)
I am treating you as an asthma exacerbation and covering it with an antibiotic.  I sent in a Z-Pak.  You will take 2 tablets today and then starting tomorrow take 1 tablet every day until the pack is finished.  I have sent you in prednisone to take 2 tablets every day for the next 3 days.  As we discussed, I would like you to use your albuterol every 6 hours at least for the next day to keep your airway open.  Tessalon Perles have been sent to the pharmacy to use twice a day if needed for cough.  Make sure you are drinking plenty of fluids.  - Get plenty of rest and drink plenty of fluids. - Try to breathe moist air. Use a cold mist humidifier. - Consume warm fluids (soup or tea) to provide relief for a stuffy nose and to loosen phlegm. - For nasal stuffiness, try saline nasal spray or a Neti Pot. Afrin nasal spray can also be used but this product should not be used longer than 3 days or it will cause rebound nasal stuffiness (worsening nasal congestion). - For sore throat pain relief: suck on throat lozenges, hard candy or popsicles; gargle with warm salt water (1/4 tsp. salt per 8 oz. of water); and eat soft, bland foods. - Eat a well-balanced diet. If you cannot, ensure you are getting enough nutrients by taking a daily multivitamin. - Avoid dairy products, as they can thicken phlegm. - Avoid alcohol, as it impairs your body's immune system.  CONTACT YOUR DOCTOR IF YOU EXPERIENCE ANY OF THE FOLLOWING: - High fever - Ear pain - Sinus-type headache - Unusually severe cold symptoms - Cough that gets worse while other cold symptoms improve - Flare up of any chronic lung problem, such as asthma - Your symptoms persist longer than 2 weeks

## 2017-03-07 NOTE — Progress Notes (Signed)
Subjective: CC: cough PCP: Chipper Herb, MD [id number] DEEYA RICHESON is a 67 y.o. female presenting to clinic today for:  1. Cough Patient notes that she has had a mildly productive cough, associated rib pain, myalgia, sinus stuffiness since Sunday.  She denies shortness of breath or significant wheezing.  She has been using her albuterol intermittently but notes that she is reluctant to use this because of cramping in her legs.  She notes that she is also been using some old Tessalon Perles and Mucinex DM which have helped the cough some.  She uses her Flonase but she is not finding that this is helping much with nasal congestion.  Of note she is only been taking Flonase for about 1 day.  She reports subjective fevers, decreased appetite and intermittent nausea.  She has been taking in fluids without difficulty.  No vomiting or diarrhea.  She has had her flu shot this year.  Past medical history is significant for asthma.  No previous hospitalizations or intubation for asthma exacerbation.  She notes multiple sick contacts.   ROS: Per HPI  Allergies  Allergen Reactions  . Codeine Nausea And Vomiting  . Penicillins Other (See Comments)    Does not know   Past Medical History:  Diagnosis Date  . Asthma   . Colon polyp   . Depression   . Food allergy   . GERD (gastroesophageal reflux disease)   . Hyperlipidemia   . Hypertension   . Urticaria     Current Outpatient Medications:  .  albuterol (PROVENTIL HFA;VENTOLIN HFA) 108 (90 Base) MCG/ACT inhaler, Inhale 2 puffs into the lungs every 6 (six) hours as needed for wheezing or shortness of breath., Disp: 1 Inhaler, Rfl: 6 .  albuterol (PROVENTIL) (2.5 MG/3ML) 0.083% nebulizer solution, Take 3 mLs (2.5 mg total) by nebulization every 6 (six) hours as needed for wheezing or shortness of breath., Disp: 150 mL, Rfl: 6 .  aspirin 81 MG tablet, Take 81 mg by mouth daily., Disp: , Rfl:  .  calcium carbonate (TUMS) 500 MG chewable tablet, Chew  1 tablet (200 mg of elemental calcium total) by mouth as needed for indigestion or heartburn., Disp: , Rfl:  .  Cholecalciferol (VITAMIN D) 2000 UNITS tablet, Take 2,000 Units by mouth daily., Disp: , Rfl:  .  EPINEPHrine (EPIPEN 2-PAK) 0.3 mg/0.3 mL IJ SOAJ injection, Inject into the muscle once., Disp: , Rfl:  .  fish oil-omega-3 fatty acids 1000 MG capsule, Take 2 g by mouth daily. , Disp: , Rfl:  .  lisinopril-hydrochlorothiazide (PRINZIDE,ZESTORETIC) 20-12.5 MG tablet, Take 1 tablet by mouth daily. (Patient not taking: Reported on 02/20/2017), Disp: 90 tablet, Rfl: 3 .  loratadine (CLARITIN) 10 MG tablet, Take 10 mg by mouth 2 (two) times daily., Disp: , Rfl:  .  montelukast (SINGULAIR) 10 MG tablet, Take 1 tablet (10 mg total) by mouth at bedtime., Disp: 30 tablet, Rfl: 5 .  omeprazole (PRILOSEC) 40 MG capsule, Take 1 capsule (40 mg total) by mouth every morning. (Patient taking differently: Take 40 mg by mouth daily. ), Disp: 30 capsule, Rfl: 5 .  ranitidine (ZANTAC) 150 MG tablet, Take 150 mg by mouth 2 (two) times daily as needed. , Disp: , Rfl:  Social History   Socioeconomic History  . Marital status: Divorced    Spouse name: Not on file  . Number of children: 3  . Years of education: Not on file  . Highest education level: Not on file  Social Needs  . Financial resource strain: Not on file  . Food insecurity - worry: Not on file  . Food insecurity - inability: Not on file  . Transportation needs - medical: Not on file  . Transportation needs - non-medical: Not on file  Occupational History  . Occupation: private duty Manufacturing systems engineer  Tobacco Use  . Smoking status: Former Smoker    Packs/day: 0.50    Years: 28.00    Pack years: 14.00    Types: Cigarettes    Last attempt to quit: 01/17/1996    Years since quitting: 21.1  . Smokeless tobacco: Never Used  Substance and Sexual Activity  . Alcohol use: Yes    Alcohol/week: 0.0 oz    Comment: occasional use of wine   . Drug use:  No  . Sexual activity: Not on file  Other Topics Concern  . Not on file  Social History Narrative  . Not on file   Family History  Problem Relation Age of Onset  . Heart disease Mother   . Alzheimer's disease Mother   . Diabetes Mother   . Other Father        deceased age 32 from some sort of GI problems but no malignancy  . Diabetes Sister   . Diabetes Brother   . Cancer Cousin        pancreatic  . Colon cancer Neg Hx     Objective: Office vital signs reviewed. BP 117/79   Pulse 91   Temp 99.1 F (37.3 C) (Oral)   Ht 5\' 3"  (1.6 m)   Wt 187 lb (84.8 kg)   SpO2 97%   BMI 33.13 kg/m   Physical Examination:  General: Awake, alert, well nourished, nontoxic, No acute distress HEENT: Normal    Neck: No masses palpated. No lymphadenopathy    Ears: Tympanic membranes intact, normal light reflex, no erythema, no bulging    Eyes: PERRLA, extraocular membranes intact, sclera white    Nose: nasal turbinates moist, clear nasal discharge    Throat: moist mucus membranes, no erythema, no tonsillar exudate.  Airway is patent Cardio: regular rate and rhythm, S1S2 heard, no murmurs appreciated Pulm: Mild expiratory wheezes noted throughout.  No rhonchi or rales; she has good air movement.  Normal work of breathing on room air  Results for orders placed or performed in visit on 03/07/17 (from the past 24 hour(s))  Veritor Flu A/B Waived     Status: None   Collection Time: 03/07/17  2:49 PM  Result Value Ref Range   Influenza A Negative Negative   Influenza B Negative Negative   Narrative   Performed at:  [address] [place], [place]  [id number] Lab Director: Colletta Maryland [facility], Phone:  [phone]     Assessment/ Plan: 67 y.o. female   1. Mild intermittent asthma with exacerbation She has a low-grade fever to 99.1 F here in office.  She has normal respiratory rate and normal pulse ox on room air.  Vital signs are normal.  She likely had a viral  URI that has now thrown her into exacerbation. Rapid flu was negative. Her physical exam was noteable for expiratory wheezes throughout.  So far, she is failing most over-the-counter methods.  We will treat as an exacerbation with oral steroid, antibiotic and recommendation to increase her albuterol use to every 6 hours scheduled for the next 1-2 days.  Push oral fluids.  Get plenty of rest.  2. Body aches -  Veritor Flu A/B Waived   Orders Placed This Encounter  Procedures  . Veritor Flu A/B Waived    Order Specific Question:   Source    Answer:   nasal   Meds ordered this encounter  Medications  . azithromycin (ZITHROMAX Z-PAK) 250 MG tablet    Sig: As directed    Dispense:  6 tablet    Refill:  0  . benzonatate (TESSALON) 200 MG capsule    Sig: Take 1 capsule (200 mg total) by mouth 2 (two) times daily as needed for cough.    Dispense:  20 capsule    Refill:  0  . predniSONE (DELTASONE) 20 MG tablet    Sig: Take 2 tablets (40 mg total) by mouth daily with breakfast.    Dispense:  6 tablet    Refill:  0     Maricella Filyaw Windell Moulding, DO [place] [phone]

## 2017-03-12 ENCOUNTER — Ambulatory Visit (INDEPENDENT_AMBULATORY_CARE_PROVIDER_SITE_OTHER): Payer: Medicare Other | Admitting: Family Medicine

## 2017-03-12 ENCOUNTER — Encounter: Payer: Self-pay | Admitting: Family Medicine

## 2017-03-12 VITALS — BP 98/62 | HR 85 | Temp 98.9°F | Ht 63.0 in | Wt 187.0 lb

## 2017-03-12 DIAGNOSIS — J189 Pneumonia, unspecified organism: Secondary | ICD-10-CM

## 2017-03-12 DIAGNOSIS — J4521 Mild intermittent asthma with (acute) exacerbation: Secondary | ICD-10-CM | POA: Diagnosis not present

## 2017-03-12 MED ORDER — METHYLPREDNISOLONE ACETATE 80 MG/ML IJ SUSP
80.0000 mg | Freq: Once | INTRAMUSCULAR | Status: AC
  Administered 2017-03-12: 80 mg via INTRAMUSCULAR

## 2017-03-12 MED ORDER — DOXYCYCLINE HYCLATE 100 MG PO TABS: 100.0000 mg | ORAL_TABLET | Freq: Two times a day (BID) | ORAL | 0 refills | Status: DC

## 2017-03-12 NOTE — Progress Notes (Signed)
BP 98/62   Pulse 85   Temp 98.9 F (37.2 C) (Oral)   Ht 5\' 3"  (1.6 m)   Wt 187 lb (84.8 kg)   SpO2 98%   BMI 33.13 kg/m    Subjective:    Patient ID: Leah [name], female    DOB: 1950/03/16, 67 y.o.   MRN: [id number]  HPI: Leah [name] is a 67 y.o. female presenting on 03/12/2017 for Cough, chest congestion, right sided rib pain (seen last week, no better, completed Zpak)   HPI Cough and chest congestion and rib pain from coughing Patient comes in complaining of cough and chest congestion and rib pain from coughing that is been going on for about 2 weeks.  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  She was seen last week but has not been better.  She did take the Z-Pak and finish the full thing but did not finish the prednisone that she was given from last week.  She denies any shortness of breath but thinks she may have had a little bit of wheezing and has been helped some as well.  She says is just been getting worse and the cough is keeping her up at night.  Relevant past medical, surgical, family and social history reviewed and updated as indicated. Interim medical history since our last visit reviewed. Allergies and medications reviewed and updated.  Review of Systems  Constitutional: Negative for chills and fever.  HENT: Positive for congestion, postnasal drip, rhinorrhea, sinus pressure, sneezing and sore throat. Negative for ear discharge and ear pain.   Eyes: Negative for pain, redness and visual disturbance.  Respiratory: Positive for cough and wheezing. Negative for chest tightness and shortness of breath.   Cardiovascular: Negative for chest pain and leg swelling.  Skin: Negative for rash.  Neurological: Negative for light-headedness and headaches.  Psychiatric/Behavioral: Negative for agitation and behavioral problems.  All other systems reviewed and are negative.   Per HPI unless specifically indicated  above   Allergies as of 03/12/2017      Reactions   Codeine Nausea And Vomiting   Penicillins Other (See Comments)   Does not know      Medication List        Accurate as of 03/12/17  2:57 PM. Always use your most recent med list.          albuterol (2.5 MG/3ML) 0.083% nebulizer solution Commonly known as:  PROVENTIL Take 3 mLs (2.5 mg total) by nebulization every 6 (six) hours as needed for wheezing or shortness of breath.   albuterol 108 (90 Base) MCG/ACT inhaler Commonly known as:  PROVENTIL HFA;VENTOLIN HFA Inhale 2 puffs into the lungs every 6 (six) hours as needed for wheezing or shortness of breath.   aspirin 81 MG tablet Take 81 mg by mouth daily.   benzonatate 200 MG capsule Commonly known as:  TESSALON Take 1 capsule (200 mg total) by mouth 2 (two) times daily as needed for cough.   calcium carbonate 500 MG chewable tablet Commonly known as:  TUMS Chew 1 tablet (200 mg of elemental calcium total) by mouth as needed for indigestion or heartburn.   doxycycline 100 MG tablet Commonly known as:  VIBRA-TABS Take 1 tablet (100 mg total) by mouth 2 (two) times daily. 1 po bid   EPIPEN 2-PAK 0.3 mg/0.3 mL Soaj injection Generic drug:  EPINEPHrine Inject into the muscle once.   fish oil-omega-3 fatty acids  1000 MG capsule Take 2 g by mouth daily.   lisinopril-hydrochlorothiazide 20-12.5 MG tablet Commonly known as:  PRINZIDE,ZESTORETIC Take 1 tablet by mouth daily.   loratadine 10 MG tablet Commonly known as:  CLARITIN Take 10 mg by mouth 2 (two) times daily.   montelukast 10 MG tablet Commonly known as:  SINGULAIR Take 1 tablet (10 mg total) by mouth at bedtime.   omeprazole 40 MG capsule Commonly known as:  PRILOSEC Take 1 capsule (40 mg total) by mouth every morning.   predniSONE 20 MG tablet Commonly known as:  DELTASONE Take 2 tablets (40 mg total) by mouth daily with breakfast.   ranitidine 150 MG tablet Commonly known as:  ZANTAC Take 150 mg  by mouth 2 (two) times daily as needed.   Vitamin D 2000 units tablet Take 2,000 Units by mouth daily.          Objective:    BP 98/62   Pulse 85   Temp 98.9 F (37.2 C) (Oral)   Ht 5\' 3"  (1.6 m)   Wt 187 lb (84.8 kg)   SpO2 98%   BMI 33.13 kg/m   Wt Readings from Last 3 Encounters:  03/12/17 187 lb (84.8 kg)  03/07/17 187 lb (84.8 kg)  02/20/17 192 lb (87.1 kg)    Physical Exam  Constitutional: She is oriented to person, place, and time. She appears well-developed and well-nourished. No distress.  HENT:  Right Ear: Tympanic membrane, external ear and ear canal normal.  Left Ear: Tympanic membrane, external ear and ear canal normal.  Nose: Mucosal edema and rhinorrhea present. No epistaxis. Right sinus exhibits no maxillary sinus tenderness and no frontal sinus tenderness. Left sinus exhibits no maxillary sinus tenderness and no frontal sinus tenderness.  Mouth/Throat: Uvula is midline and mucous membranes are normal. Posterior oropharyngeal edema and posterior oropharyngeal erythema present. No oropharyngeal exudate or tonsillar abscesses.  Eyes: Conjunctivae are normal.  Neck: Neck supple. No thyromegaly present.  Cardiovascular: Normal rate, regular rhythm, normal heart sounds and intact distal pulses.  No murmur heard. Pulmonary/Chest: Effort normal and breath sounds normal. No respiratory distress. She has no wheezes. She has no rales.  Musculoskeletal: Normal range of motion. She exhibits no edema or tenderness.  Lymphadenopathy:    She has no cervical adenopathy.  Neurological: She is alert and oriented to person, place, and time. Coordination normal.  Skin: Skin is warm and dry. No rash noted. She is not diaphoretic.  Psychiatric: She has a normal mood and affect. Her behavior is normal.  Nursing note and vitals reviewed.       Assessment & Plan:   Problem List Items Addressed This Visit    None    Visit Diagnoses    Mild intermittent asthma with  exacerbation    -  Primary   Relevant Medications   doxycycline (VIBRA-TABS) 100 MG tablet   methylPREDNISolone acetate (DEPO-MEDROL) injection 80 mg (Completed)   Atypical pneumonia       Relevant Medications   doxycycline (VIBRA-TABS) 100 MG tablet   methylPREDNISolone acetate (DEPO-MEDROL) injection 80 mg (Completed)       Follow up plan: No Follow-up on file.  Counseling provided for all of the vaccine components No orders of the defined types were placed in this encounter.   Caryl Pina, MD [facility] 03/12/2017, 2:57 PM

## 2017-03-19 ENCOUNTER — Ambulatory Visit (INDEPENDENT_AMBULATORY_CARE_PROVIDER_SITE_OTHER): Payer: Medicare Other | Admitting: Family Medicine

## 2017-03-19 ENCOUNTER — Ambulatory Visit (INDEPENDENT_AMBULATORY_CARE_PROVIDER_SITE_OTHER): Payer: Medicare Other

## 2017-03-19 VITALS — BP 120/76 | HR 80 | Temp 97.6°F | Resp 18 | Wt 187.6 lb

## 2017-03-19 DIAGNOSIS — R062 Wheezing: Secondary | ICD-10-CM

## 2017-03-19 DIAGNOSIS — R05 Cough: Secondary | ICD-10-CM | POA: Diagnosis not present

## 2017-03-19 DIAGNOSIS — R059 Cough, unspecified: Secondary | ICD-10-CM

## 2017-03-19 MED ORDER — BUDESONIDE-FORMOTEROL FUMARATE 80-4.5 MCG/ACT IN AERO: 2.0000 | INHALATION_SPRAY | Freq: Two times a day (BID) | RESPIRATORY_TRACT | 0 refills | Status: DC

## 2017-03-19 NOTE — Patient Instructions (Signed)
Your chest x-ray appears to be viral.  There is nothing to suggest a pneumonia.  I have given you a sample of Symbicort.  He will use 2 puffs twice a day every day of this medication.  Keep your appointment with Dr. Laurance Flatten on Friday.   Asthma, Adult Asthma is a condition of the lungs in which the airways tighten and narrow. Asthma can make it hard to breathe. Asthma cannot be cured, but medicine and lifestyle changes can help control it. Asthma may be started (triggered) by:  Animal skin flakes (dander).  Dust.  Cockroaches.  Pollen.  Mold.  Smoke.  Cleaning products.  Hair sprays or aerosol sprays.  Paint fumes or strong smells.  Cold air, weather changes, and winds.  Crying or laughing hard.  Stress.  Certain medicines or drugs.  Foods, such as dried fruit, potato chips, and sparkling grape juice.  Infections or conditions (colds, flu).  Exercise.  Certain medical conditions or diseases.  Exercise or tiring activities.  Follow these instructions at home:  Take medicine as told by your doctor.  Use a peak flow meter as told by your doctor. A peak flow meter is a tool that measures how well the lungs are working.  Record and keep track of the peak flow meter's readings.  Understand and use the asthma action plan. An asthma action plan is a written plan for taking care of your asthma and treating your attacks.  To help prevent asthma attacks: ? Do not smoke. Stay away from secondhand smoke. ? Change your heating and air conditioning filter often. ? Limit your use of fireplaces and wood stoves. ? Get rid of pests (such as roaches and mice) and their droppings. ? Throw away plants if you see mold on them. ? Clean your floors. Dust regularly. Use cleaning products that do not smell. ? Have someone vacuum when you are not home. Use a vacuum cleaner with a HEPA filter if possible. ? Replace carpet with wood, tile, or vinyl flooring. Carpet can trap animal skin  flakes and dust. ? Use allergy-proof pillows, mattress covers, and box spring covers. ? Wash bed sheets and blankets every week in hot water and dry them in a dryer. ? Use blankets that are made of polyester or cotton. ? Clean bathrooms and kitchens with bleach. If possible, have someone repaint the walls in these rooms with mold-resistant paint. Keep out of the rooms that are being cleaned and painted. ? Wash hands often. Contact a doctor if:  You have make a whistling sound when breaking (wheeze), have shortness of breath, or have a cough even if taking medicine to prevent attacks.  The colored mucus you cough up (sputum) is thicker than usual.  The colored mucus you cough up changes from clear or white to yellow, green, gray, or bloody.  You have problems from the medicine you are taking such as: ? A rash. ? Itching. ? Swelling. ? Trouble breathing.  You need reliever medicines more than 2-3 times a week.  Your peak flow measurement is still at 50-79% of your personal best after following the action plan for 1 hour.  You have a fever. Get help right away if:  You seem to be worse and are not responding to medicine during an asthma attack.  You are short of breath even at rest.  You get short of breath when doing very little activity.  You have trouble eating, drinking, or talking.  You have chest pain.  You  have a fast heartbeat.  Your lips or fingernails start to turn blue.  You are light-headed, dizzy, or faint.  Your peak flow is less than 50% of your personal best. This information is not intended to replace advice given to you by your health care provider. Make sure you discuss any questions you have with your health care provider. Document Released: 06/21/2007 Document Revised: 06/10/2015 Document Reviewed: 08/01/2012 Elsevier Interactive Patient Education  2017 [employer].

## 2017-03-19 NOTE — Progress Notes (Signed)
Subjective: CC: URI PCP: Chipper Herb, MD [id number] Leah [name] is a 67 y.o. female presenting to clinic today for:  1. URI Patient with persistent upper respiratory symptoms, including congestion, loss of voice.  She reports nonproductive cough.  She does note that she had scant hemoptysis yesterday during coughing.  She reports that she feels that her chest is congested and she is unable to expectorate phlegm.  She has been using albuterol 2 puffs every 4 hours, Robitussin and is status post completion of both azithromycin and doxycycline.  She feels that she becomes short of breath with speaking a lot.  Denies dyspnea on exertion, lower extremity swelling, shortness of breath with rest.  She has multiple sick contacts.  She voices concern because symptoms have been ongoing for over 2 weeks now.   ROS: Per HPI  Allergies  Allergen Reactions  . Codeine Nausea And Vomiting  . Penicillins Other (See Comments)    Does not know   Past Medical History:  Diagnosis Date  . Asthma   . Colon polyp   . Depression   . Food allergy   . GERD (gastroesophageal reflux disease)   . Hyperlipidemia   . Hypertension   . Urticaria     Current Outpatient Medications:  .  albuterol (PROVENTIL HFA;VENTOLIN HFA) 108 (90 Base) MCG/ACT inhaler, Inhale 2 puffs into the lungs every 6 (six) hours as needed for wheezing or shortness of breath., Disp: 1 Inhaler, Rfl: 6 .  albuterol (PROVENTIL) (2.5 MG/3ML) 0.083% nebulizer solution, Take 3 mLs (2.5 mg total) by nebulization every 6 (six) hours as needed for wheezing or shortness of breath., Disp: 150 mL, Rfl: 6 .  aspirin 81 MG tablet, Take 81 mg by mouth daily., Disp: , Rfl:  .  benzonatate (TESSALON) 200 MG capsule, Take 1 capsule (200 mg total) by mouth 2 (two) times daily as needed for cough., Disp: 20 capsule, Rfl: 0 .  calcium carbonate (TUMS) 500 MG chewable tablet, Chew 1 tablet (200 mg of elemental calcium total) by mouth as needed for indigestion  or heartburn., Disp: , Rfl:  .  Cholecalciferol (VITAMIN D) 2000 UNITS tablet, Take 2,000 Units by mouth daily., Disp: , Rfl:  .  doxycycline (VIBRA-TABS) 100 MG tablet, Take 1 tablet (100 mg total) by mouth 2 (two) times daily. 1 po bid, Disp: 20 tablet, Rfl: 0 .  EPINEPHrine (EPIPEN 2-PAK) 0.3 mg/0.3 mL IJ SOAJ injection, Inject into the muscle once., Disp: , Rfl:  .  fish oil-omega-3 fatty acids 1000 MG capsule, Take 2 g by mouth daily. , Disp: , Rfl:  .  lisinopril-hydrochlorothiazide (PRINZIDE,ZESTORETIC) 20-12.5 MG tablet, Take 1 tablet by mouth daily., Disp: 90 tablet, Rfl: 3 .  loratadine (CLARITIN) 10 MG tablet, Take 10 mg by mouth 2 (two) times daily., Disp: , Rfl:  .  montelukast (SINGULAIR) 10 MG tablet, Take 1 tablet (10 mg total) by mouth at bedtime., Disp: 30 tablet, Rfl: 5 .  omeprazole (PRILOSEC) 40 MG capsule, Take 1 capsule (40 mg total) by mouth every morning. (Patient taking differently: Take 40 mg by mouth daily. ), Disp: 30 capsule, Rfl: 5 .  ranitidine (ZANTAC) 150 MG tablet, Take 150 mg by mouth 2 (two) times daily as needed. , Disp: , Rfl:  .  predniSONE (DELTASONE) 20 MG tablet, Take 2 tablets (40 mg total) by mouth daily with breakfast., Disp: 6 tablet, Rfl: 0 Social History   Socioeconomic History  . Marital status: Divorced    Spouse  name: Not on file  . Number of children: 3  . Years of education: Not on file  . Highest education level: Not on file  Social Needs  . Financial resource strain: Not on file  . Food insecurity - worry: Not on file  . Food insecurity - inability: Not on file  . Transportation needs - medical: Not on file  . Transportation needs - non-medical: Not on file  Occupational History  . Occupation: private duty Manufacturing systems engineer  Tobacco Use  . Smoking status: Former Smoker    Packs/day: 0.50    Years: 28.00    Pack years: 14.00    Types: Cigarettes    Last attempt to quit: 01/17/1996    Years since quitting: 21.1  . Smokeless tobacco:  Never Used  Substance and Sexual Activity  . Alcohol use: Yes    Alcohol/week: 0.0 oz    Comment: occasional use of wine   . Drug use: No  . Sexual activity: Not on file  Other Topics Concern  . Not on file  Social History Narrative  . Not on file   Family History  Problem Relation Age of Onset  . Heart disease Mother   . Alzheimer's disease Mother   . Diabetes Mother   . Other Father        deceased age 20 from some sort of GI problems but no malignancy  . Diabetes Sister   . Diabetes Brother   . Cancer Cousin        pancreatic  . Colon cancer Neg Hx     Objective: Office vital signs reviewed. BP 120/76 (BP Location: Left Arm, Patient Position: Sitting, Cuff Size: Normal)   Pulse 80   Temp 97.6 F (36.4 C) (Oral)   Resp 18   Wt 187 lb 9.6 oz (85.1 kg)   SpO2 99%   BMI 33.23 kg/m   Physical Examination:  General: Awake, alert, well nourished, well appearing, No acute distress HEENT: Normal    Neck: No masses palpated. No lymphadenopathy    Ears: Tympanic membranes intact, normal light reflex, no erythema, no bulging    Eyes: PERRLA, extraocular membranes intact, sclera white    Nose: nasal turbinates moist, clear nasal discharge    Throat: moist mucus membranes, no erythema.  Airway is patent Cardio: regular rate and rhythm, S1S2 heard, no murmurs appreciated Pulm: Mild, Global expiratory wheezes with good air movement.  No rhonchi or rales; normal work of breathing on room air  Dg Chest 2 View  Result Date: 03/19/2017 CLINICAL DATA:  Cough and chest congestion EXAM: CHEST  2 VIEW COMPARISON:  Chest x-ray of January 17, 2017 and chest CT scan of January 18, 2017. FINDINGS: The lungs are adequately inflated and clear. The heart and pulmonary vascularity are normal. The mediastinum is normal in width. There is no pleural effusion. The trachea is midline. There is multilevel degenerative disc disease of the thoracic spine. IMPRESSION: There is no pneumonia nor other acute  cardiopulmonary abnormality. Electronically Signed   By: David  Martinique M.D.   On: 03/19/2017 10:52     Assessment/ Plan: 67 y.o. female   1. Cough I suspect that she has persistent bronchospasm in the setting of recent illness.  I personally reviewed her chest x-ray and this appears to be very viral in nature.  There is nothing on her chest x-ray to suggest pneumonia.  This was confirmed by radiology.  She is now status post completion of both azithromycin and Doxy.  She is also been treated with a full course of steroid Dosepak and a steroid injection in office.  I provided her with a sample of Symbicort to use 2 puffs twice daily.  She has an appointment with her primary care doctor on Friday.  If she is having persistent symptoms, we discussed that she could consider seeing a pulmonologist but that it is not unusual to have persistent coughing and chest congestion in the setting of ongoing viral illnesses.  We discussed that she could be checked for which specific viruses causing her symptoms but this would likely not alter her plan. Strict return precautions and reasons for emergent evaluation in the emergency department review with patient.  She voiced understanding and will follow-up as needed. - DG Chest 2 View  2. Wheeze   Orders Placed This Encounter  Procedures  . DG Chest 2 View    Order Specific Question:   Reason for Exam (SYMPTOM  OR DIAGNOSIS REQUIRED)    Answer:   continued cough and congestion    Order Specific Question:   Preferred imaging location?    Answer:   Internal   Meds ordered this encounter  Medications  . budesonide-formoterol (SYMBICORT) 80-4.5 MCG/ACT inhaler    Sig: Inhale 2 puffs into the lungs 2 (two) times daily.    Dispense:  1 Inhaler    Refill:  [place], [place] [phone]

## 2017-03-20 ENCOUNTER — Ambulatory Visit: Payer: Medicare Other | Admitting: Family Medicine

## 2017-03-23 ENCOUNTER — Encounter: Payer: Self-pay | Admitting: Family Medicine

## 2017-03-23 ENCOUNTER — Ambulatory Visit (INDEPENDENT_AMBULATORY_CARE_PROVIDER_SITE_OTHER): Payer: Medicare Other | Admitting: Family Medicine

## 2017-03-23 VITALS — BP 94/61 | HR 86 | Temp 97.7°F | Ht 63.0 in | Wt 187.0 lb

## 2017-03-23 DIAGNOSIS — R05 Cough: Secondary | ICD-10-CM

## 2017-03-23 DIAGNOSIS — J209 Acute bronchitis, unspecified: Secondary | ICD-10-CM | POA: Diagnosis not present

## 2017-03-23 DIAGNOSIS — R062 Wheezing: Secondary | ICD-10-CM | POA: Diagnosis not present

## 2017-03-23 DIAGNOSIS — R059 Cough, unspecified: Secondary | ICD-10-CM

## 2017-03-23 MED ORDER — METHYLPREDNISOLONE ACETATE 80 MG/ML IJ SUSP
60.0000 mg | Freq: Once | INTRAMUSCULAR | Status: AC
  Administered 2017-03-23: 60 mg via INTRAMUSCULAR

## 2017-03-23 NOTE — Patient Instructions (Signed)
Take Mucinex, maximum strength, blue and white in color, plain and take 1 twice daily with a large glass of water Use cool mist humidification regularly Use nasal saline 1 spray each nostril 4 times daily Continue with Flonase Continue with Symbicort 2 puffs twice daily and rinse mouth after using Use albuterol inhaler regularly every 6 hours or 4 times daily for the next few days Take Tylenol for aches pains and fever Gargle with warm salty water Call us on Tuesday regarding progress Rest

## 2017-03-23 NOTE — Progress Notes (Signed)
Subjective:    Patient ID: Leah [name], female    DOB: 03/12/1950, 67 y.o.   MRN: [id number]  HPI Patient here today for on-going congestion and cough. This started 3 weeks ago.  The patient has been feeling sick for 3 weeks.  She has been worse the last 2 weeks.  She has had hoarseness congestion cough.  Initially she did have a fever but does not have one now.  She has been somewhat dizzy and lightheaded.  She finished a course of doxycycline yesterday.  She had a flu test that was negative.  A chest x-ray was also done and this was negative for pneumonia.  On February 20 she did have a course of prednisone and a shot of Depo-Medrol.  The actual chest x-ray was reviewed and there was no sign of any pneumonia or acute cardiopulmonary abnormality.  She is allergic to codeine and penicillin.    Patient Active Problem List   Diagnosis Date Noted  . Hypokalemia 02/20/2017  . Chest pain 01/17/2017  . Adjustment disorder with mixed anxiety and depressed mood 03/25/2013  . Abdominal pain, chronic, epigastric 11/12/2012  . Nausea alone 11/12/2012  . Personal history of colonic polyps 11/12/2012  . Hypertension, benign essential, goal below 140/90 10/30/2012  . Hyperlipidemia 10/22/2012  . GERD (gastroesophageal reflux disease) 10/22/2012  . Asthma 10/22/2012   Outpatient Encounter Medications as of 03/23/2017  Medication Sig  . albuterol (PROVENTIL HFA;VENTOLIN HFA) 108 (90 Base) MCG/ACT inhaler Inhale 2 puffs into the lungs every 6 (six) hours as needed for wheezing or shortness of breath.  Marland Kitchen albuterol (PROVENTIL) (2.5 MG/3ML) 0.083% nebulizer solution Take 3 mLs (2.5 mg total) by nebulization every 6 (six) hours as needed for wheezing or shortness of breath.  Marland Kitchen aspirin 81 MG tablet Take 81 mg by mouth daily.  . budesonide-formoterol (SYMBICORT) 80-4.5 MCG/ACT inhaler Inhale 2 puffs into the lungs 2 (two) times daily.  . calcium carbonate (TUMS) 500 MG chewable tablet Chew 1 tablet (200 mg  of elemental calcium total) by mouth as needed for indigestion or heartburn.  . Cholecalciferol (VITAMIN D) 2000 UNITS tablet Take 2,000 Units by mouth daily.  Marland Kitchen EPINEPHrine (EPIPEN 2-PAK) 0.3 mg/0.3 mL IJ SOAJ injection Inject into the muscle once.  . fish oil-omega-3 fatty acids 1000 MG capsule Take 2 g by mouth daily.   Marland Kitchen lisinopril-hydrochlorothiazide (PRINZIDE,ZESTORETIC) 20-12.5 MG tablet Take 1 tablet by mouth daily.  Marland Kitchen loratadine (CLARITIN) 10 MG tablet Take 10 mg by mouth 2 (two) times daily.  . montelukast (SINGULAIR) 10 MG tablet Take 1 tablet (10 mg total) by mouth at bedtime.  Marland Kitchen omeprazole (PRILOSEC) 40 MG capsule Take 1 capsule (40 mg total) by mouth every morning. (Patient taking differently: Take 40 mg by mouth daily. )  . ranitidine (ZANTAC) 150 MG tablet Take 150 mg by mouth 2 (two) times daily as needed.   . [DISCONTINUED] benzonatate (TESSALON) 200 MG capsule Take 1 capsule (200 mg total) by mouth 2 (two) times daily as needed for cough.  . [DISCONTINUED] doxycycline (VIBRA-TABS) 100 MG tablet Take 1 tablet (100 mg total) by mouth 2 (two) times daily. 1 po bid  . [DISCONTINUED] predniSONE (DELTASONE) 20 MG tablet Take 2 tablets (40 mg total) by mouth daily with breakfast.   No facility-administered encounter medications on file as of 03/23/2017.       Review of Systems  Constitutional: Negative.  Negative for fever.  HENT: Positive for congestion and voice change.   Eyes:  Negative.   Respiratory: Positive for cough. Wheezing: lungs feel tight    Cardiovascular: Negative.   Gastrointestinal: Negative.   Endocrine: Negative.   Genitourinary: Negative.   Musculoskeletal: Negative.   Skin: Negative.   Allergic/Immunologic: Negative.   Neurological: Negative.   Hematological: Negative.   Psychiatric/Behavioral: Negative.        Objective:   Physical Exam  Constitutional: She is oriented to person, place, and time. She appears well-developed and well-nourished. No  distress.  HENT:  Head: Normocephalic and atraumatic.  Right Ear: External ear normal.  Left Ear: External ear normal.  Mouth/Throat: Oropharynx is clear and moist. No oropharyngeal exudate.  Nasal pallor.  Voice is hoarse.  Eyes: Conjunctivae and EOM are normal. Pupils are equal, round, and reactive to light. Right eye exhibits no discharge. Left eye exhibits no discharge. No scleral icterus.  Neck: Normal range of motion. Neck supple. No thyromegaly present.  Cardiovascular: Normal rate and regular rhythm.  No murmur heard. Pulmonary/Chest: Effort normal. She has wheezes. She has no rales.  Dry cough.  With deep inspiration faint expiratory and inspiratory wheezes noted.  Musculoskeletal: Normal range of motion. She exhibits no edema.  Lymphadenopathy:    She has no cervical adenopathy.  Neurological: She is alert and oriented to person, place, and time.  Skin: Skin is warm and dry. No rash noted.  Psychiatric: She has a normal mood and affect. Her behavior is normal. Judgment and thought content normal.  Nursing note and vitals reviewed.   BP 94/61 (BP Location: Left Arm)   Pulse 86   Temp 97.7 F (36.5 C) (Oral)   Ht 5\' 3"  (1.6 m)   Wt 187 lb (84.8 kg)   BMI 33.13 kg/m        Assessment & Plan:  1. Cough -Take Mucinex, maximum strength, blue and white in color, plain and take 1 twice daily with a large glass of water every 12 hours -Use albuterol inhaler 4 times daily -Use cool mist humidification -Keep the house as cool as possible -Use nasal saline frequently and continue with Flonase 1 spray each nostril at bedtime -Continue with Symbicort 2 puffs twice daily and rinse mouth after using - CBC with Differential/Platelet - methylPREDNISolone acetate (DEPO-MEDROL) injection 60 mg  2. Wheeze -Use inhalers as directed above - CBC with Differential/Platelet - methylPREDNISolone acetate (DEPO-MEDROL) injection 60 mg  3. Bronchitis with bronchospasm -All above -Call  back on Tuesday with progress  Meds ordered this encounter  Medications  . methylPREDNISolone acetate (DEPO-MEDROL) injection 60 mg   Patient Instructions  Take Mucinex, maximum strength, blue and white in color, plain and take 1 twice daily with a large glass of water Use cool mist humidification regularly Use nasal saline 1 spray each nostril 4 times daily Continue with Flonase Continue with Symbicort 2 puffs twice daily and rinse mouth after using Use albuterol inhaler regularly every 6 hours or 4 times daily for the next few days Take Tylenol for aches pains and fever Gargle with warm salty water Call us on Tuesday regarding progress Rest  Arrie Senate MD

## 2017-03-24 LAB — CBC WITH DIFFERENTIAL/PLATELET
Basophils Absolute: 0 10*3/uL (ref 0.0–0.2)
Basos: 1 %
EOS (ABSOLUTE): 0.1 10*3/uL (ref 0.0–0.4)
Eos: 1 %
Hematocrit: 36.9 % (ref 34.0–46.6)
Hemoglobin: 12.1 g/dL (ref 11.1–15.9)
Immature Grans (Abs): 0 10*3/uL (ref 0.0–0.1)
Immature Granulocytes: 0 %
Lymphocytes Absolute: 1.5 10*3/uL (ref 0.7–3.1)
Lymphs: 35 %
MCH: 27 pg (ref 26.6–33.0)
MCHC: 32.8 g/dL (ref 31.5–35.7)
MCV: 82 fL (ref 79–97)
Monocytes Absolute: 0.3 10*3/uL (ref 0.1–0.9)
Monocytes: 8 %
Neutrophils Absolute: 2.3 10*3/uL (ref 1.4–7.0)
Neutrophils: 55 %
Platelets: 276 10*3/uL (ref 150–379)
RBC: 4.48 x10E6/uL (ref 3.77–5.28)
RDW: 14 % (ref 12.3–15.4)
WBC: 4.2 10*3/uL (ref 3.4–10.8)

## 2017-03-27 ENCOUNTER — Telehealth: Payer: Self-pay | Admitting: Family Medicine

## 2017-03-27 NOTE — Telephone Encounter (Signed)
Continue with current treatment regimen voice rest lots of fluids and more time

## 2017-03-27 NOTE — Telephone Encounter (Signed)
FYI for provider

## 2017-03-27 NOTE — Telephone Encounter (Signed)
Pt notified of recommendation Verbalizes understanding 

## 2017-03-30 ENCOUNTER — Telehealth: Payer: Self-pay | Admitting: Pharmacist

## 2017-03-30 NOTE — Telephone Encounter (Signed)
Pt has been approved for Pral[employer] patient assistance through [employer]. LMOM for pt.

## 2017-04-02 ENCOUNTER — Telehealth: Payer: Self-pay | Admitting: Family Medicine

## 2017-04-02 NOTE — Telephone Encounter (Signed)
Let me know the answer to those questions

## 2017-04-02 NOTE — Telephone Encounter (Signed)
How long had the tick been on her.??  Was it a deer tick or a wood tick or dog tick.??

## 2017-04-03 NOTE — Telephone Encounter (Signed)
Per pt, the tick was a deer tick, attached for less than 24 hours Area now raised and red Pt has appt on Thursday

## 2017-04-03 NOTE — Telephone Encounter (Signed)
Call a prescription in for doxycycline 100 mg twice daily with food for 3 weeks.

## 2017-04-04 ENCOUNTER — Ambulatory Visit (INDEPENDENT_AMBULATORY_CARE_PROVIDER_SITE_OTHER): Payer: Medicare Other | Admitting: Gastroenterology

## 2017-04-04 ENCOUNTER — Encounter: Payer: Self-pay | Admitting: Gastroenterology

## 2017-04-04 VITALS — BP 118/64 | HR 87 | Ht 63.5 in | Wt 187.0 lb

## 2017-04-04 DIAGNOSIS — G8929 Other chronic pain: Secondary | ICD-10-CM

## 2017-04-04 DIAGNOSIS — R1013 Epigastric pain: Secondary | ICD-10-CM

## 2017-04-04 DIAGNOSIS — Z86010 Personal history of colonic polyps: Secondary | ICD-10-CM | POA: Diagnosis not present

## 2017-04-04 MED ORDER — DOXYCYCLINE HYCLATE 100 MG PO TABS: 100.0000 mg | ORAL_TABLET | Freq: Two times a day (BID) | ORAL | 0 refills | Status: DC

## 2017-04-04 NOTE — Telephone Encounter (Signed)
Med sent to pharm  Pt aware of recommendation.

## 2017-04-04 NOTE — Patient Instructions (Addendum)
   You will be due for a recall colonoscopy in June. We will send you a reminder in the mail when it gets closer to that time.  Normal BMI (Body Mass Index- based on height and weight) is between 23 and 30. Your BMI today is Body mass index is 32.61 kg/m. Leah [name] Please consider follow up  regarding your BMI with your Primary Care Provider.

## 2017-04-04 NOTE — Progress Notes (Signed)
Review of pertinent gastrointestinal problems: 1. Precancerous polyps: Colonoscopy 11 2014, Dr. Gala Romney, done for history of adenomas. Findings left-sided diverticulosis. 2 diminutive polyps, normal terminal ileum. The polyps were adenomatous on pathology. 2. Epigastric pain workup; Upper endoscopy 11 2014, Dr. Gala Romney, done for abdominal pain, epigastric. This was normal except for previous evidence of fundoplication surgery. He increased her as needed H2 blocker. 3. GERD-like epigastric pain; established with Dr. Ardis Hughs 2016: epigastric burning, CT scan abd/pelvis 07/2014 was normal. Labs 05/2014 cbc, cmet normal.  She was asked to take H2 blocker BID and call office 5-6 weeks to report on her response. We have not heard from her since then.  HPI: This is a very pleasant 67 year old woman whom I last saw 2-1/2 years ago.  Chief complaint is polyp surveillance testing, chronic intermittent epigastric pain  She had a nissen in 1990s, following that she underwent what sounds like esophageal dilations. She does not think that the nissen helped her symptoms at all.  Intermittently she has severe epigastric pain.   Has been going on for 20 years.  Takes zantac 150mg  at bedtime and in the morning.  She takes omeprazole PRN only.  She has hoarseness that is improving, 4-5 weeks started as a chest cold.  She thinks it is time for another colonoscopy.  She says she had ulcers in her esophagus long ago.    Recent z pack and then 2 week course of doxy, also predisone all for URI issues.  ROS: complete GI ROS as described in HPI, all other review negative.  Constitutional:  No unintentional weight loss  (overall stable in usual range for her).   Past Medical History:  Diagnosis Date  . Asthma   . Colon polyp   . Depression   . Food allergy   . GERD (gastroesophageal reflux disease)   . Hyperlipidemia   . Hypertension   . Urticaria     Past Surgical History:  Procedure Laterality Date  .  ABDOMINAL HYSTERECTOMY    . Carpel Tunnel    . CHOLECYSTECTOMY    . COLONOSCOPY  02/08/2005   IRS:WNIOEVOJ hemorrhoids otherwise normal rectum/ A few scattered, shallow, left-sided sigmoid diverticula/The remainder of the colonic mucosa appeared normal  . COLONOSCOPY WITH ESOPHAGOGASTRODUODENOSCOPY (EGD) N/A 11/29/2012   Procedure: COLONOSCOPY WITH ESOPHAGOGASTRODUODENOSCOPY (EGD);  Surgeon: Daneil Dolin, MD;  [facility]: [facility];  Service: Endoscopy;  Laterality: N/A;  10:45  . CYST REMOVAL TRUNK    . HERNIA REPAIR     ventral X 2 with mesh, last one 2011 ([place]). complicated with 2 week hospital stay  . NISSEN FUNDOPLICATION  5009  . TUBAL LIGATION      Current Outpatient Medications  Medication Sig Dispense Refill  . albuterol (PROVENTIL HFA;VENTOLIN HFA) 108 (90 Base) MCG/ACT inhaler Inhale 2 puffs into the lungs every 6 (six) hours as needed for wheezing or shortness of breath. 1 Inhaler 6  . albuterol (PROVENTIL) (2.5 MG/3ML) 0.083% nebulizer solution Take 3 mLs (2.5 mg total) by nebulization every 6 (six) hours as needed for wheezing or shortness of breath. 150 mL 6  . Alirocumab (PRALUENT) 75 MG/ML SOPN Inject 1 pen into the skin every 14 (fourteen) days. 2 pen 11  . aspirin 81 MG tablet Take 81 mg by mouth daily.    . budesonide-formoterol (SYMBICORT) 80-4.5 MCG/ACT inhaler Inhale 2 puffs into the lungs 2 (two) times daily. 1 Inhaler 0  . calcium carbonate (TUMS) 500 MG chewable tablet Chew 1 tablet (200 mg  of elemental calcium total) by mouth as needed for indigestion or heartburn.    . Cholecalciferol (VITAMIN D) 2000 UNITS tablet Take 2,000 Units by mouth daily.    Marland Kitchen doxycycline (VIBRA-TABS) 100 MG tablet Take 1 tablet (100 mg total) by mouth 2 (two) times daily. 1 po bid 42 tablet 0  . EPINEPHrine (EPIPEN 2-PAK) 0.3 mg/0.3 mL IJ SOAJ injection Inject into the muscle once.    . fish oil-omega-3 fatty acids 1000 MG capsule Take 2 g by mouth daily.     Marland Kitchen  lisinopril-hydrochlorothiazide (PRINZIDE,ZESTORETIC) 20-12.5 MG tablet Take 1 tablet by mouth daily. 90 tablet 3  . loratadine (CLARITIN) 10 MG tablet Take 10 mg by mouth 2 (two) times daily.    . montelukast (SINGULAIR) 10 MG tablet Take 1 tablet (10 mg total) by mouth at bedtime. 30 tablet 5  . omeprazole (PRILOSEC) 40 MG capsule Take 1 capsule (40 mg total) by mouth every morning. (Patient taking differently: Take 40 mg by mouth daily. ) 30 capsule 5  . ranitidine (ZANTAC) 150 MG tablet Take 150 mg by mouth 2 (two) times daily as needed.      No current facility-administered medications for this visit.     Allergies as of 04/04/2017 - Review Complete 04/04/2017  Allergen Reaction Noted  . Codeine Nausea And Vomiting 04/18/2012  . Penicillins Other (See Comments) 10/22/2012    Family History  Problem Relation Age of Onset  . Heart disease Mother   . Alzheimer's disease Mother   . Diabetes Mother   . Other Father        deceased age 18 from some sort of GI problems but no malignancy  . Diabetes Sister   . Diabetes Brother   . Cancer Cousin        pancreatic  . Colon cancer Neg Hx   . Stomach cancer Neg Hx     Social History   Socioeconomic History  . Marital status: Divorced    Spouse name: Not on file  . Number of children: 3  . Years of education: Not on file  . Highest education level: Not on file  Social Needs  . Financial resource strain: Not on file  . Food insecurity - worry: Not on file  . Food insecurity - inability: Not on file  . Transportation needs - medical: Not on file  . Transportation needs - non-medical: Not on file  Occupational History  . Occupation: private duty Manufacturing systems engineer  Tobacco Use  . Smoking status: Former Smoker    Packs/day: 0.50    Years: 28.00    Pack years: 14.00    Types: Cigarettes    Last attempt to quit: 01/17/1996    Years since quitting: 21.2  . Smokeless tobacco: Never Used  Substance and Sexual Activity  . Alcohol use:  Yes    Alcohol/week: 0.0 oz    Comment: occasional use of wine   . Drug use: No  . Sexual activity: Yes    Partners: Male  Other Topics Concern  . Not on file  Social History Narrative  . Not on file     Physical Exam: BP 118/64   Pulse 87   Ht 5' 3.5" (1.613 m)   Wt 187 lb (84.8 kg)   BMI 32.61 kg/m  Constitutional: generally well-appearing Psychiatric: alert and oriented x3 Abdomen: soft, nontender, nondistended, no obvious ascites, no peritoneal signs, normal bowel sounds No peripheral edema noted in lower extremities  Assessment and plan: 67 y.o.  female with chronic intermittent epigastric pain for 20 years, personal history of precancerous polyps  She has had intermittent epigastric pain for 20 years.  She has had numerous studies alluding upper endoscopy, CT scan.  Her symptoms do not seem any worse than her usual which is mild, intermittent.  I do not think she needs any further testing for this and she is content with that.  She did have a colonoscopy 2014 but found precancerous colon polyps by an outside physician.  I recommended repeat colonoscopy for surveillance.  She wants to schedule that may or June of this year to allow some more time for an upper respiratory infection to resolve.  Please see the "Patient Instructions" section for addition details about the plan.  Owens Loffler, MD [facility] 04/04/2017, 3:14 PM

## 2017-04-05 ENCOUNTER — Ambulatory Visit (INDEPENDENT_AMBULATORY_CARE_PROVIDER_SITE_OTHER): Payer: Medicare Other | Admitting: Family Medicine

## 2017-04-05 ENCOUNTER — Encounter: Payer: Self-pay | Admitting: Family Medicine

## 2017-04-05 VITALS — BP 112/72 | HR 81 | Temp 96.4°F | Ht 63.5 in | Wt 187.0 lb

## 2017-04-05 DIAGNOSIS — R05 Cough: Secondary | ICD-10-CM

## 2017-04-05 DIAGNOSIS — W57XXXA Bitten or stung by nonvenomous insect and other nonvenomous arthropods, initial encounter: Secondary | ICD-10-CM

## 2017-04-05 DIAGNOSIS — J209 Acute bronchitis, unspecified: Secondary | ICD-10-CM

## 2017-04-05 DIAGNOSIS — R059 Cough, unspecified: Secondary | ICD-10-CM

## 2017-04-05 NOTE — Progress Notes (Signed)
Subjective:    Patient ID: Leah [name], female    DOB: 06/13/1950, 67 y.o.   MRN: [id number]  HPI  Patient here today for follow up on cough, congestion and hoarseness.  The patient continues to have problems with hoarseness and chest congestion.  She has had some blood streaked sputum that is dark in color.  She had a cardiac CT and the incidental findings from this shows no acute or significant extracardiac abnormality.  This was done in January of this year.  She recently called with a history of a deer tick bite.  Doxycycline was called in and she has not had this filled at this time.    Patient Active Problem List   Diagnosis Date Noted  . Hypokalemia 02/20/2017  . Chest pain 01/17/2017  . Adjustment disorder with mixed anxiety and depressed mood 03/25/2013  . Abdominal pain, chronic, epigastric 11/12/2012  . Nausea alone 11/12/2012  . Personal history of colonic polyps 11/12/2012  . Hypertension, benign essential, goal below 140/90 10/30/2012  . Hyperlipidemia 10/22/2012  . GERD (gastroesophageal reflux disease) 10/22/2012  . Asthma 10/22/2012   Outpatient Encounter Medications as of 04/05/2017  Medication Sig  . albuterol (PROVENTIL HFA;VENTOLIN HFA) 108 (90 Base) MCG/ACT inhaler Inhale 2 puffs into the lungs every 6 (six) hours as needed for wheezing or shortness of breath.  Marland Kitchen albuterol (PROVENTIL) (2.5 MG/3ML) 0.083% nebulizer solution Take 3 mLs (2.5 mg total) by nebulization every 6 (six) hours as needed for wheezing or shortness of breath.  Marland Kitchen aspirin 81 MG tablet Take 81 mg by mouth daily.  . budesonide-formoterol (SYMBICORT) 80-4.5 MCG/ACT inhaler Inhale 2 puffs into the lungs 2 (two) times daily.  . calcium carbonate (TUMS) 500 MG chewable tablet Chew 1 tablet (200 mg of elemental calcium total) by mouth as needed for indigestion or heartburn.  . Cholecalciferol (VITAMIN D) 2000 UNITS tablet Take 2,000 Units by mouth daily.  . fish oil-omega-3 fatty acids 1000 MG  capsule Take 2 g by mouth daily.   Marland Kitchen lisinopril-hydrochlorothiazide (PRINZIDE,ZESTORETIC) 20-12.5 MG tablet Take 1 tablet by mouth daily.  Marland Kitchen loratadine (CLARITIN) 10 MG tablet Take 10 mg by mouth 2 (two) times daily.  . montelukast (SINGULAIR) 10 MG tablet Take 1 tablet (10 mg total) by mouth at bedtime.  Marland Kitchen omeprazole (PRILOSEC) 40 MG capsule Take 1 capsule (40 mg total) by mouth every morning. (Patient taking differently: Take 40 mg by mouth daily. )  . ranitidine (ZANTAC) 150 MG tablet Take 150 mg by mouth 2 (two) times daily as needed.   . Alirocumab (PRALUENT) 75 MG/ML SOPN Inject 1 pen into the skin every 14 (fourteen) days.  Marland Kitchen doxycycline (VIBRA-TABS) 100 MG tablet Take 1 tablet (100 mg total) by mouth 2 (two) times daily. 1 po bid (Patient not taking: Reported on 04/05/2017)  . EPINEPHrine (EPIPEN 2-PAK) 0.3 mg/0.3 mL IJ SOAJ injection Inject into the muscle once.   No facility-administered encounter medications on file as of 04/05/2017.      Review of Systems  Constitutional: Negative.   HENT: Positive for congestion (chest = blood and black looking ) and voice change (hoarseness).   Eyes: Negative.   Respiratory: Positive for cough.   Cardiovascular: Negative.   Gastrointestinal: Negative.   Endocrine: Negative.   Genitourinary: Negative.   Musculoskeletal: Negative.   Skin: Negative.   Allergic/Immunologic: Negative.   Neurological: Negative.   Hematological: Negative.   Psychiatric/Behavioral: Negative.        Objective:   Physical  Exam  Constitutional: She is oriented to person, place, and time. She appears well-developed and well-nourished. No distress.  Patient looks good and is in good spirits and is talking better today  HENT:  Head: Normocephalic and atraumatic.  Eyes: Pupils are equal, round, and reactive to light. Conjunctivae and EOM are normal. Right eye exhibits no discharge. Left eye exhibits no discharge. No scleral icterus.  Neck: Normal range of motion.    Cardiovascular: Normal rate, regular rhythm and normal heart sounds. Exam reveals no gallop and no friction rub.  No murmur heard. Pulmonary/Chest: Effort normal and breath sounds normal. No respiratory distress. She has no wheezes. She has no rales.  The lungs are clear and the cough is dry.  There is no wheezes or rhonchi or rales.  Musculoskeletal: Normal range of motion. She exhibits no edema.  Neurological: She is alert and oriented to person, place, and time.  Skin: Skin is warm and dry. Rash noted.  Tick bite right lower extremity with minimal redness only at site of bite  Psychiatric: She has a normal mood and affect. Her behavior is normal. Judgment and thought content normal.  Nursing note and vitals reviewed.   BP 112/72 (BP Location: Left Arm)   Pulse 81   Temp (!) 96.4 F (35.8 C) (Oral)   Ht 5' 3.5" (1.613 m)   Wt 187 lb (84.8 kg)   BMI 32.61 kg/m        Assessment & Plan:  1. Cough -This continues and may be somewhat better but she is concerned about some black tinged irritation that is coming up with the scant sputum.  She showed me pictures of this but no active view was obtained.  She will hold the baby aspirin and continue with Mucinex twice daily and coolmist humidification  2. Bronchitis with bronchospasm -Continue with inhalers and Mucinex and drinking plenty of fluids -Because of the persistent nature of this bronchitis and bronchospasm, we will schedule a visit in a couple weeks out if she is not better to visit with the pulmonologist for further evaluation and suggestions  3. Tick bite, initial encounter -This was an apparent deer tick on the right lower extremity.  She should take doxycycline 100 mg twice daily with food for 3 weeks and until completed.  No orders of the defined types were placed in this encounter.  Patient Instructions  Doxycycline 100 mg twice daily with food for 3 weeks Hold the aspirin over the next 2-3 weeks Continue with  Mucinex twice daily with a large glass of water Continue with cool mist humidification Continue to drink plenty of fluids and stay well-hydrated Continue with Symbicort 2 puffs twice daily and rinse mouth after using Follow-up with pulmonologist as directed and if a lot better call and cancel the appointment Voice rest and avoid irritating environment as much as possible  Arrie Senate MD

## 2017-04-05 NOTE — Patient Instructions (Signed)
Doxycycline 100 mg twice daily with food for 3 weeks Hold the aspirin over the next 2-3 weeks Continue with Mucinex twice daily with a large glass of water Continue with cool mist humidification Continue to drink plenty of fluids and stay well-hydrated Continue with Symbicort 2 puffs twice daily and rinse mouth after using Follow-up with pulmonologist as directed and if a lot better call and cancel the appointment Voice rest and avoid irritating environment as much as possible

## 2017-04-05 NOTE — Telephone Encounter (Signed)
LMOM again. 

## 2017-04-05 NOTE — Addendum Note (Signed)
Addended by: Zannie Cove on: 04/05/2017 02:28 PM   Modules accepted: Orders

## 2017-04-05 NOTE — Telephone Encounter (Signed)
Pt is aware of approval and will contact [facility] to coordinate delivery. She will let us know once she receives her shipment so that we can coordinate lab work.

## 2017-04-30 ENCOUNTER — Encounter: Payer: Self-pay | Admitting: Cardiovascular Disease

## 2017-04-30 ENCOUNTER — Ambulatory Visit (INDEPENDENT_AMBULATORY_CARE_PROVIDER_SITE_OTHER): Payer: Medicare Other | Admitting: Cardiovascular Disease

## 2017-04-30 VITALS — BP 130/90 | HR 88 | Ht 63.5 in | Wt 185.8 lb

## 2017-04-30 DIAGNOSIS — E782 Mixed hyperlipidemia: Secondary | ICD-10-CM

## 2017-04-30 DIAGNOSIS — I2510 Atherosclerotic heart disease of native coronary artery without angina pectoris: Secondary | ICD-10-CM | POA: Diagnosis not present

## 2017-04-30 DIAGNOSIS — I10 Essential (primary) hypertension: Secondary | ICD-10-CM

## 2017-04-30 NOTE — Progress Notes (Signed)
Chief Complaint  Patient presents with  . Follow-up    CAD   History of Present Illness: 67 yo female with history of HTN, HLD and CAD who is here today for cardiac follow up. She was admitted to [facility] January 2019 with chest pain.and had a coronary CTA which showed mild non-obstructive CAD. She has been statin intolerant in the past. She has also not tolerated Zetia. She was seen in the lipid clinic 02/20/17 and was started on Praluent.   She is here today for follow up. She has occasional chest pain. This is mostly at rest. Her pain is never exertional. This feels like heartburn. The patient denies any dyspnea, palpitations, lower extremity edema, orthopnea, PND, dizziness, near syncope or syncope. Recent tick bit but took doxy. Also viral illness prior to the tick bite.   Primary Care Physician: Chipper Herb, MD   Past Medical History:  Diagnosis Date  . Asthma   . CAD (coronary artery disease)   . Colon polyp   . Depression   . Food allergy   . GERD (gastroesophageal reflux disease)   . Hyperlipidemia   . Hypertension   . Urticaria     Past Surgical History:  Procedure Laterality Date  . ABDOMINAL HYSTERECTOMY    . Carpel Tunnel    . CHOLECYSTECTOMY    . COLONOSCOPY  02/08/2005   OVF:IEPPIRJJ hemorrhoids otherwise normal rectum/ A few scattered, shallow, left-sided sigmoid diverticula/The remainder of the colonic mucosa appeared normal  . COLONOSCOPY WITH ESOPHAGOGASTRODUODENOSCOPY (EGD) N/A 11/29/2012   Procedure: COLONOSCOPY WITH ESOPHAGOGASTRODUODENOSCOPY (EGD);  Surgeon: Daneil Dolin, MD;  Location: [facility];  Service: Endoscopy;  Laterality: N/A;  10:45  . CYST REMOVAL TRUNK    . HERNIA REPAIR     ventral X 2 with mesh, last one 2011 ([place]). complicated with 2 week hospital stay  . NISSEN FUNDOPLICATION  8841  . TUBAL LIGATION      Current Outpatient Medications  Medication Sig Dispense Refill  . albuterol (PROVENTIL HFA;VENTOLIN HFA) 108 (90  Base) MCG/ACT inhaler Inhale 2 puffs into the lungs every 6 (six) hours as needed for wheezing or shortness of breath. 1 Inhaler 6  . albuterol (PROVENTIL) (2.5 MG/3ML) 0.083% nebulizer solution Take 3 mLs (2.5 mg total) by nebulization every 6 (six) hours as needed for wheezing or shortness of breath. 150 mL 6  . Alirocumab (PRALUENT) 75 MG/ML SOPN Inject 1 pen into the skin every 14 (fourteen) days. 2 pen 11  . aspirin 81 MG tablet Take 81 mg by mouth daily.    . budesonide-formoterol (SYMBICORT) 80-4.5 MCG/ACT inhaler Inhale 2 puffs into the lungs 2 (two) times daily. 1 Inhaler 0  . calcium carbonate (TUMS) 500 MG chewable tablet Chew 1 tablet (200 mg of elemental calcium total) by mouth as needed for indigestion or heartburn.    . Cholecalciferol (VITAMIN D) 2000 UNITS tablet Take 2,000 Units by mouth daily.    Marland Kitchen EPINEPHrine (EPIPEN 2-PAK) 0.3 mg/0.3 mL IJ SOAJ injection Inject into the muscle once.    . fish oil-omega-3 fatty acids 1000 MG capsule Take 2 g by mouth daily.     Marland Kitchen lisinopril-hydrochlorothiazide (PRINZIDE,ZESTORETIC) 20-12.5 MG tablet Take 1 tablet by mouth daily. 90 tablet 3  . loratadine (CLARITIN) 10 MG tablet Take 10 mg by mouth 2 (two) times daily.    . montelukast (SINGULAIR) 10 MG tablet Take 1 tablet (10 mg total) by mouth at bedtime. 30 tablet 5  . omeprazole (PRILOSEC)  40 MG capsule Take 1 capsule (40 mg total) by mouth every morning. (Patient taking differently: Take 40 mg by mouth daily. ) 30 capsule 5  . ranitidine (ZANTAC) 150 MG tablet Take 150 mg by mouth 2 (two) times daily as needed.      No current facility-administered medications for this visit.     Allergies  Allergen Reactions  . Codeine Nausea And Vomiting  . Penicillins Other (See Comments)    Does not know    Social History   Socioeconomic History  . Marital status: Divorced    Spouse name: Not on file  . Number of children: 3  . Years of education: Not on file  . Highest education level:  Not on file  Occupational History  . Occupation: private duty Manufacturing systems engineer  Social Needs  . Financial resource strain: Not on file  . Food insecurity:    Worry: Not on file    Inability: Not on file  . Transportation needs:    Medical: Not on file    Non-medical: Not on file  Tobacco Use  . Smoking status: Former Smoker    Packs/day: 0.50    Years: 28.00    Pack years: 14.00    Types: Cigarettes    Last attempt to quit: 01/17/1996    Years since quitting: 21.2  . Smokeless tobacco: Never Used  Substance and Sexual Activity  . Alcohol use: Yes    Alcohol/week: 0.0 oz    Comment: occasional use of wine   . Drug use: No  . Sexual activity: Yes    Partners: Male  Lifestyle  . Physical activity:    Days per week: Not on file    Minutes per session: Not on file  . Stress: Not on file  Relationships  . Social connections:    Talks on phone: Not on file    Gets together: Not on file    Attends religious service: Not on file    Active member of club or organization: Not on file    Attends meetings of clubs or organizations: Not on file    Relationship status: Not on file  . Intimate partner violence:    Fear of current or ex partner: Not on file    Emotionally abused: Not on file    Physically abused: Not on file    Forced sexual activity: Not on file  Other Topics Concern  . Not on file  Social History Narrative  . Not on file    Family History  Problem Relation Age of Onset  . Heart disease Mother   . Alzheimer's disease Mother   . Diabetes Mother   . Other Father        deceased age 42 from some sort of GI problems but no malignancy  . Diabetes Sister   . Diabetes Brother   . Cancer Cousin        pancreatic  . Colon cancer Neg Hx   . Stomach cancer Neg Hx     Review of Systems:  As stated in the HPI and otherwise negative.   BP 130/90   Pulse 88   Ht 5' 3.5" (1.613 m)   Wt 185 lb 12.8 oz (84.3 kg)   SpO2 96%   BMI 32.40 kg/m   Physical  Examination: General: Well developed, well nourished, NAD  HEENT: OP clear, mucus membranes moist  SKIN: warm, dry. No rashes. Neuro: No focal deficits  Musculoskeletal: Muscle strength 5/5 all ext  Psychiatric: Mood and affect normal  Neck: No JVD, no carotid bruits, no thyromegaly, no lymphadenopathy.  Lungs:Clear bilaterally, no wheezes, rhonci, crackles Cardiovascular: Regular rate and rhythm. No murmurs, gallops or rubs. Abdomen:Soft. Bowel sounds present. Non-tender.  Extremities: No lower extremity edema. Pulses are 2 + in the bilateral DP/PT.  EKG:  EKG is not ordered today. The ekg ordered today demonstrates   Recent Labs: 12/18/2016: ALT 43 01/17/2017: TSH 0.746 02/20/2017: BUN 17; Creatinine, Ser 0.96; Potassium 4.1; Sodium 141 03/23/2017: Hemoglobin 12.1; Platelets 276   Lipid Panel    Component Value Date/Time   CHOL 267 (H) 12/18/2016 0812   TRIG 112 12/18/2016 0812   TRIG 79 02/17/2016 0817   HDL 50 12/18/2016 0812   HDL 53 02/17/2016 0817   CHOLHDL 5.3 (H) 12/18/2016 0812   CHOLHDL 3.5 06/11/2007 0450   VLDL 20 06/11/2007 0450   LDLCALC 195 (H) 12/18/2016 0812   LDLCALC 171 (H) 08/25/2013 0810     Wt Readings from Last 3 Encounters:  04/30/17 185 lb 12.8 oz (84.3 kg)  04/05/17 187 lb (84.8 kg)  04/04/17 187 lb (84.8 kg)     Other studies Reviewed: Additional studies/ records that were reviewed today include: . Review of the above records demonstrates:    Assessment and Plan:   1. CAD without angina: Mild CAD noted on coronary CTA. She is now on ASA. She is statin intolerant. She is on Praluent. Healthy diet and exercise are recommended.   2. HTN: BP is controlled. No changes.   3. HLD: Start Praluent today.  Check lipids first week of June  Current medicines are reviewed at length with the patient today.  The patient does not have concerns regarding medicines.  The following changes have been made:  no change  Labs/ tests ordered today include:    Orders Placed This Encounter  Procedures  . Lipid Profile  . Hepatic function panel     Disposition:   FU with me in 12 months   Signed, Lauree Chandler, MD 04/30/2017 2:29 PM    [address], [place],   [address] Phone: [phone]; Fax: [phone]

## 2017-04-30 NOTE — Patient Instructions (Addendum)
Medication Instructions:  Your physician recommends that you continue on your current medications as directed. Please refer to the Current Medication list given to you today.   Labwork: none  Testing/Procedures: Your physician recommends that you return for lab work on June 4,2019--Lipid and liver profiles.  This will be fasting.  The lab opens at 7:30 AM  Follow-Up: Your physician recommends that you schedule a follow-up appointment in: 12 months. Please call our office in about 9 months to schedule this appointment    Any Other Special Instructions Will Be Listed Below (If Applicable).   The number for our lipid clinic is [phone]  If you need a refill on your cardiac medications before your next appointment, please call your pharmacy.

## 2017-05-02 ENCOUNTER — Telehealth: Payer: Self-pay | Admitting: Pharmacist

## 2017-05-02 MED ORDER — ROSUVASTATIN CALCIUM 5 MG PO TABS: ORAL_TABLET | ORAL | 3 refills | Status: DC

## 2017-05-02 NOTE — Telephone Encounter (Signed)
Pt called clinic and left a message that she has been experiencing a rash since starting Praluent injections.  LMOM for pt to return call to clinic.

## 2017-05-02 NOTE — Telephone Encounter (Signed)
Pt returned call to clinic. She has done 1 Praluent injection and noticed bumps on her neck and chest that were itchy and red. She noticed some itchiness in her throat but did not have trouble breathing. She did have cramping in her feet as well that felt like charley horses. She does not want to do any more injections.  Discussed CLEAR trial vs statin rechallenge. She is willing to try lower dose of Crestor 5mg  3 days per week. Rx has been sent in and advised pt to call clinic if she experiences any myalgias. Would discuss CLEAR trial again at that time.

## 2017-05-03 ENCOUNTER — Ambulatory Visit: Payer: Medicare Other | Admitting: Family Medicine

## 2017-05-17 ENCOUNTER — Telehealth: Payer: Self-pay | Admitting: Family Medicine

## 2017-05-17 MED ORDER — DOXYCYCLINE HYCLATE 100 MG PO TABS: 100.0000 mg | ORAL_TABLET | Freq: Two times a day (BID) | ORAL | 0 refills | Status: DC

## 2017-05-17 NOTE — Telephone Encounter (Signed)
Was this a deer tick or a regular dog tick.?  Start doxycycline 100 mg twice daily with food for 2 weeks if dog tick 3 weeks if deer tick.

## 2017-05-17 NOTE — Telephone Encounter (Signed)
Pt aware - doxy sent in

## 2017-05-17 NOTE — Telephone Encounter (Signed)
Please advise 

## 2017-05-21 ENCOUNTER — Telehealth: Payer: Self-pay | Admitting: Family Medicine

## 2017-05-21 ENCOUNTER — Ambulatory Visit: Payer: Medicare Other | Admitting: Family Medicine

## 2017-05-21 NOTE — Telephone Encounter (Signed)
Lm 5/6-jhb

## 2017-05-22 NOTE — Telephone Encounter (Signed)
appt rescheduled for 06/01/17 at 4 pm

## 2017-05-22 NOTE — Telephone Encounter (Signed)
Pt is aware of new apt date and time

## 2017-05-22 NOTE — Telephone Encounter (Signed)
Any time after 2 pm would be good for her if Roselyn Reef can reschedule the apt she missed

## 2017-05-25 ENCOUNTER — Encounter: Payer: Self-pay | Admitting: Family Medicine

## 2017-06-01 ENCOUNTER — Ambulatory Visit (INDEPENDENT_AMBULATORY_CARE_PROVIDER_SITE_OTHER): Payer: Medicare Other | Admitting: Family Medicine

## 2017-06-01 ENCOUNTER — Encounter: Payer: Self-pay | Admitting: Family Medicine

## 2017-06-01 VITALS — BP 106/70 | HR 91 | Temp 97.0°F | Ht 63.5 in | Wt 188.0 lb

## 2017-06-01 DIAGNOSIS — R1010 Upper abdominal pain, unspecified: Secondary | ICD-10-CM | POA: Diagnosis not present

## 2017-06-01 DIAGNOSIS — E559 Vitamin D deficiency, unspecified: Secondary | ICD-10-CM

## 2017-06-01 DIAGNOSIS — E7800 Pure hypercholesterolemia, unspecified: Secondary | ICD-10-CM

## 2017-06-01 DIAGNOSIS — Z13820 Encounter for screening for osteoporosis: Secondary | ICD-10-CM | POA: Diagnosis not present

## 2017-06-01 DIAGNOSIS — I10 Essential (primary) hypertension: Secondary | ICD-10-CM

## 2017-06-01 DIAGNOSIS — M25552 Pain in left hip: Secondary | ICD-10-CM | POA: Diagnosis not present

## 2017-06-01 DIAGNOSIS — K2100 Gastro-esophageal reflux disease with esophagitis, without bleeding: Secondary | ICD-10-CM

## 2017-06-01 DIAGNOSIS — Z780 Asymptomatic menopausal state: Secondary | ICD-10-CM

## 2017-06-01 DIAGNOSIS — I2510 Atherosclerotic heart disease of native coronary artery without angina pectoris: Secondary | ICD-10-CM | POA: Diagnosis not present

## 2017-06-01 DIAGNOSIS — W57XXXA Bitten or stung by nonvenomous insect and other nonvenomous arthropods, initial encounter: Secondary | ICD-10-CM

## 2017-06-01 NOTE — Progress Notes (Signed)
Subjective:    Patient ID: Leah [name], female    DOB: [birth year], 67 y.o.   MRN: [id number]  HPI Pt here for follow up and management of chronic medical problems which includes gerd, hypertension and hyperlipidemia. She is taking medication regularly.  Since the patient was last seen she has had visit to several specialists including the cardiologist and gastroenterologist.  She is complaining today with some left hip pain.  It hurts when she moves in certain way to get up.  She had films of the right hip a little over a year ago and and LS-spine and everything thing in the spine seem to be ordered with good disc spaces and no significant problems with her right hip at the time.  She has had coronary calcium scoring which was elevated and it was felt that she had nonobstructive coronary artery disease.  No angina.  She was also going to be started on a PC SK 9 inhibitor and I am not sure if this is happened yet or not.  She will be due to get a colonoscopy sometime in June by Dr. Ardis Hughs because of a previous history of adenomatous or precancerous polyps 5 years ago.  The patient does have some occasional chest pain and left arm pain and it may be with activity or not with activity.  There is no shortness of breath and her asthma is under good control.  She does have a lot of problems with her stomach and it seems to be associated with eating.  She is scheduled for a colonoscopy soon with Dr. Ardis Hughs.  She has not seen any blood in the stool or had any black tarry bowel movements.  She is passing her water without problems.  She is also complaining with left lateral hip pain with no history of any injury.  She had x-rays of the right hip and no abnormalities were seen and this was about a year ago.  Incidentally, the patient did not tolerate the PCSK9 inhibitor therapy and she is back on Crestor 5    Patient Active Problem List   Diagnosis Date Noted  . Hypokalemia 02/20/2017  . Chest pain  01/17/2017  . Adjustment disorder with mixed anxiety and depressed mood 03/25/2013  . Abdominal pain, chronic, epigastric 11/12/2012  . Nausea alone 11/12/2012  . Personal history of colonic polyps 11/12/2012  . Hypertension, benign essential, goal below 140/90 10/30/2012  . Hyperlipidemia 10/22/2012  . GERD (gastroesophageal reflux disease) 10/22/2012  . Asthma 10/22/2012   Outpatient Encounter Medications as of 06/01/2017  Medication Sig  . albuterol (PROVENTIL HFA;VENTOLIN HFA) 108 (90 Base) MCG/ACT inhaler Inhale 2 puffs into the lungs every 6 (six) hours as needed for wheezing or shortness of breath.  Marland Kitchen albuterol (PROVENTIL) (2.5 MG/3ML) 0.083% nebulizer solution Take 3 mLs (2.5 mg total) by nebulization every 6 (six) hours as needed for wheezing or shortness of breath.  Marland Kitchen aspirin 81 MG tablet Take 81 mg by mouth daily.  . budesonide-formoterol (SYMBICORT) 80-4.5 MCG/ACT inhaler Inhale 2 puffs into the lungs 2 (two) times daily.  . calcium carbonate (TUMS) 500 MG chewable tablet Chew 1 tablet (200 mg of elemental calcium total) by mouth as needed for indigestion or heartburn.  . Cholecalciferol (VITAMIN D) 2000 UNITS tablet Take 2,000 Units by mouth daily.  Marland Kitchen EPINEPHrine (EPIPEN 2-PAK) 0.3 mg/0.3 mL IJ SOAJ injection Inject into the muscle once.  . fish oil-omega-3 fatty acids 1000 MG capsule Take 2 g by mouth daily.   Marland Kitchen  lisinopril-hydrochlorothiazide (PRINZIDE,ZESTORETIC) 20-12.5 MG tablet Take 1 tablet by mouth daily.  Marland Kitchen loratadine (CLARITIN) 10 MG tablet Take 10 mg by mouth 2 (two) times daily.  . montelukast (SINGULAIR) 10 MG tablet Take 1 tablet (10 mg total) by mouth at bedtime.  Marland Kitchen omeprazole (PRILOSEC) 40 MG capsule Take 1 capsule (40 mg total) by mouth every morning. (Patient taking differently: Take 40 mg by mouth daily. )  . ranitidine (ZANTAC) 150 MG tablet Take 150 mg by mouth 2 (two) times daily as needed.   . rosuvastatin (CRESTOR) 5 MG tablet Take 1 tablet by mouth 3 days  a week as tolerated.  . doxycycline (VIBRA-TABS) 100 MG tablet Take 1 tablet (100 mg total) by mouth 2 (two) times daily. 1 po bid (Patient not taking: Reported on 06/01/2017)   No facility-administered encounter medications on file as of 06/01/2017.      Review of Systems  Constitutional: Negative.   HENT: Negative.   Eyes: Negative.   Respiratory: Negative.   Cardiovascular: Negative.   Gastrointestinal: Negative.        Gi upset (seen GI )  Endocrine: Negative.   Genitourinary: Negative.   Musculoskeletal: Positive for arthralgias (left hip pain).  Skin: Negative.   Allergic/Immunologic: Negative.   Neurological: Negative.   Hematological: Negative.   Psychiatric/Behavioral: Negative.        Objective:   Physical Exam  Constitutional: She is oriented to person, place, and time. She appears well-developed and well-nourished. No distress.  HENT:  Head: Normocephalic and atraumatic.  Right Ear: External ear normal.  Left Ear: External ear normal.  Nose: Nose normal.  Mouth/Throat: Oropharynx is clear and moist.  Eyes: Pupils are equal, round, and reactive to light. Conjunctivae and EOM are normal. Right eye exhibits no discharge. Left eye exhibits no discharge. No scleral icterus.  Neck: Normal range of motion. Neck supple. No thyromegaly present.  No bruits thyromegaly or anterior cervical adenopathy  Cardiovascular: Normal rate, regular rhythm, normal heart sounds and intact distal pulses.  No murmur heard. Heart is regular at 72/min  Pulmonary/Chest: Effort normal and breath sounds normal. No respiratory distress. She has no wheezes. She has no rales.  Clear anteriorly and posteriorly with no wheezes rales or rhonchi.  Abdominal: Soft. Bowel sounds are normal. She exhibits no mass. There is tenderness. There is no guarding.  Tender midline and to the left of midline near incisional scar.  No liver or spleen enlargement no masses and no bruits  Musculoskeletal: Normal  range of motion. She exhibits tenderness.  Tender left lateral hip without redness or swelling  Lymphadenopathy:    She has no cervical adenopathy.  Neurological: She is alert and oriented to person, place, and time. She has normal reflexes. No cranial nerve deficit.  Skin: Skin is warm and dry.  Psychiatric: She has a normal mood and affect. Her behavior is normal. Judgment and thought content normal.  Normal mood behavior and affect  Nursing note and vitals reviewed.  BP 106/70 (BP Location: Left Arm)   Pulse 91   Temp (!) 97 F (36.1 C) (Oral)   Ht 5' 3.5" (1.613 m)   Wt 188 lb (85.3 kg)   BMI 32.78 kg/m         Assessment & Plan:  1. Vitamin D deficiency -Tinea current treatment pending results of lab work - CBC with Differential/Platelet; Future - VITAMIN D 25 Hydroxy (Vit-D Deficiency, Fractures); Future  2. Gastroesophageal reflux disease with esophagitis -Increase ranitidine to 2 twice  daily before breakfast and supper at least for 2 weeks and call us back if she is not better at that time - CBC with Differential/Platelet; Future  3. Pure hypercholesterolemia -Patient was intolerant of praulent and will go back on Crestor 5 mg 3 times weekly - CBC with Differential/Platelet; Future - Lipid panel; Future  4. Essential hypertension -Continue current treatment pending results of lab work - BMP8+EGFR; Future - CBC with Differential/Platelet; Future - Hepatic function panel; Future  5. Screening for osteoporosis - DG [facility] DEXA; Future - CBC with Differential/Platelet; Future  6. Postmenopausal - DG WRFM DEXA; Future - CBC with Differential/Platelet; Future  7. Tick bite, initial encounter -Because of recent GI problems we will check tick bite labs first and if positive will start doxycycline - Lyme Ab/Western Blot Reflex; Future - Rocky mtn spotted fvr abs pnl(IgG+IgM); Future  8. Acute hip pain, left -Use warm wet compresses to left hip and take Tylenol  for pain  9. Pain of upper abdomen -Crease ranitidine to 300 mg twice daily for 2 weeks before breakfast and supper  10. Coronary artery disease involving native coronary artery of native heart without angina pectoris -Keep appointment for follow-up with cardiology  Patient Instructions                       Medicare Annual Wellness Visit  [facility] and the medical providers at [facility] strive to bring you the best medical care.  In doing so we not only want to address your current medical conditions and concerns but also to detect new conditions early and prevent illness, disease and health-related problems.    Medicare offers a yearly Wellness Visit which allows our clinical staff to assess your need for preventative services including immunizations, lifestyle education, counseling to decrease risk of preventable diseases and screening for fall risk and other medical concerns.    This visit is provided free of charge (no copay) for all Medicare recipients. The clinical pharmacists at [facility] have begun to conduct these Wellness Visits which will also include a thorough review of all your medications.    As you primary medical provider recommend that you make an appointment for your Annual Wellness Visit if you have not done so already this year.  You may set up this appointment before you leave today or you may call back ([phone]) and schedule an appointment.  Please make sure when you call that you mention that you are scheduling your Annual Wellness Visit with the clinical pharmacist so that the appointment may be made for the proper length of time.     Continue current medications. Continue good therapeutic lifestyle changes which include good diet and exercise. Fall precautions discussed with patient. If an FOBT was given today- please return it to our front desk. If you are over 72 years old - you may need Prevnar 1 or the adult  Pneumonia vaccine.  **Flu shots are available--- please call and schedule a FLU-CLINIC appointment**  After your visit with [place] today you will receive a survey in the mail or online from [employer] regarding your care with [place]. Please take a moment to fill this out. Your feedback is very important to [place] as you can help [place] better understand your patient needs as well as improve your experience and satisfaction. WE CARE ABOUT YOU!!!    Follow-up with cardiology as planned Watch diet closely The amount of Crestor that you are  currently taking is 5 mg on Monday Wednesday and Friday and you should stay with this dose if possible until you get your next cholesterol checked Call the gastroenterologist office if you have not heard from them in a couple of weeks and make sure that they have you scheduled for your colonoscopy and possibly endoscopy if you keep having problems with your stomach Use warm wet compresses to the left hip and take Tylenol if needed for pain.  If the pain does not get better please come back and we will get an x-ray of the left hip Please return to the office for fasting lab work  Arrie Senate MD

## 2017-06-01 NOTE — Patient Instructions (Addendum)
Medicare Annual Wellness Visit  [facility] and the medical providers at [facility] strive to bring you the best medical care.  In doing so we not only want to address your current medical conditions and concerns but also to detect new conditions early and prevent illness, disease and health-related problems.    Medicare offers a yearly Wellness Visit which allows our clinical staff to assess your need for preventative services including immunizations, lifestyle education, counseling to decrease risk of preventable diseases and screening for fall risk and other medical concerns.    This visit is provided free of charge (no copay) for all Medicare recipients. The clinical pharmacists at [facility] have begun to conduct these Wellness Visits which will also include a thorough review of all your medications.    As you primary medical provider recommend that you make an appointment for your Annual Wellness Visit if you have not done so already this year.  You may set up this appointment before you leave today or you may call back ([phone]) and schedule an appointment.  Please make sure when you call that you mention that you are scheduling your Annual Wellness Visit with the clinical pharmacist so that the appointment may be made for the proper length of time.     Continue current medications. Continue good therapeutic lifestyle changes which include good diet and exercise. Fall precautions discussed with patient. If an FOBT was given today- please return it to our front desk. If you are over 58 years old - you may need Prevnar 55 or the adult Pneumonia vaccine.  **Flu shots are available--- please call and schedule a FLU-CLINIC appointment**  After your visit with [place] today you will receive a survey in the mail or online from [employer] regarding your care with [place]. Please take a moment to fill this out. Your feedback is very  important to [place] as you can help [place] better understand your patient needs as well as improve your experience and satisfaction. WE CARE ABOUT YOU!!!    Follow-up with cardiology as planned Watch diet closely The amount of Crestor that you are currently taking is 5 mg on Monday Wednesday and Friday and you should stay with this dose if possible until you get your next cholesterol checked Call the gastroenterologist office if you have not heard from them in a couple of weeks and make sure that they have you scheduled for your colonoscopy and possibly endoscopy if you keep having problems with your stomach Use warm wet compresses to the left hip and take Tylenol if needed for pain.  If the pain does not get better please come back and we will get an x-ray of the left hip Please return to the office for fasting lab work

## 2017-06-04 ENCOUNTER — Other Ambulatory Visit: Payer: Medicare Other

## 2017-06-04 DIAGNOSIS — Z13820 Encounter for screening for osteoporosis: Secondary | ICD-10-CM

## 2017-06-04 DIAGNOSIS — W57XXXA Bitten or stung by nonvenomous insect and other nonvenomous arthropods, initial encounter: Secondary | ICD-10-CM | POA: Diagnosis not present

## 2017-06-04 DIAGNOSIS — I10 Essential (primary) hypertension: Secondary | ICD-10-CM

## 2017-06-04 DIAGNOSIS — K2100 Gastro-esophageal reflux disease with esophagitis, without bleeding: Secondary | ICD-10-CM

## 2017-06-04 DIAGNOSIS — E559 Vitamin D deficiency, unspecified: Secondary | ICD-10-CM | POA: Diagnosis not present

## 2017-06-04 DIAGNOSIS — E7800 Pure hypercholesterolemia, unspecified: Secondary | ICD-10-CM | POA: Diagnosis not present

## 2017-06-04 DIAGNOSIS — Z780 Asymptomatic menopausal state: Secondary | ICD-10-CM

## 2017-06-06 LAB — CBC WITH DIFFERENTIAL/PLATELET
Basophils Absolute: 0 10*3/uL (ref 0.0–0.2)
Basos: 1 %
EOS (ABSOLUTE): 0.1 10*3/uL (ref 0.0–0.4)
Eos: 2 %
Hematocrit: 36.6 % (ref 34.0–46.6)
Hemoglobin: 12.1 g/dL (ref 11.1–15.9)
Immature Grans (Abs): 0 10*3/uL (ref 0.0–0.1)
Immature Granulocytes: 0 %
Lymphocytes Absolute: 1 10*3/uL (ref 0.7–3.1)
Lymphs: 40 %
MCH: 27.4 pg (ref 26.6–33.0)
MCHC: 33.1 g/dL (ref 31.5–35.7)
MCV: 83 fL (ref 79–97)
Monocytes Absolute: 0.2 10*3/uL (ref 0.1–0.9)
Monocytes: 7 %
Neutrophils Absolute: 1.3 10*3/uL — ABNORMAL LOW (ref 1.4–7.0)
Neutrophils: 50 %
Platelets: 259 10*3/uL (ref 150–450)
RBC: 4.41 x10E6/uL (ref 3.77–5.28)
RDW: 15.2 % (ref 12.3–15.4)
WBC: 2.6 10*3/uL — ABNORMAL LOW (ref 3.4–10.8)

## 2017-06-06 LAB — RMSF, IGG, IFA: RMSF, IGG, IFA: <1:64 {titer}

## 2017-06-06 LAB — ROCKY MTN SPOTTED FVR ABS PNL(IGG+IGM)
RMSF IgG: POSITIVE — AB
RMSF IgM: 0.3 index (ref 0.00–0.89)

## 2017-06-06 LAB — BMP8+EGFR
BUN/Creatinine Ratio: 12 (ref 12–28)
BUN: 11 mg/dL (ref 8–27)
CO2: 26 mmol/L (ref 20–29)
Calcium: 9.1 mg/dL (ref 8.7–10.3)
Chloride: 102 mmol/L (ref 96–106)
Creatinine, Ser: 0.93 mg/dL (ref 0.57–1.00)
GFR calc Af Amer: 75 mL/min/{1.73_m2} (ref >59)
GFR calc non Af Amer: 65 mL/min/{1.73_m2} (ref >59)
Glucose: 91 mg/dL (ref 65–99)
Potassium: 3.8 mmol/L (ref 3.5–5.2)
Sodium: 144 mmol/L (ref 134–144)

## 2017-06-06 LAB — LYME AB/WESTERN BLOT REFLEX
LYME DISEASE AB, QUANT, IGM: <0.8 index (ref 0.00–0.79)
Lyme IgG/IgM Ab: <0.91 {ISR} (ref 0.00–0.90)

## 2017-06-06 LAB — HEPATIC FUNCTION PANEL
ALT: 22 IU/L (ref 0–32)
AST: 21 IU/L (ref 0–40)
Albumin: 3.7 g/dL (ref 3.6–4.8)
Alkaline Phosphatase: 72 IU/L (ref 39–117)
Bilirubin Total: 0.3 mg/dL (ref 0.0–1.2)
Bilirubin, Direct: 0.1 mg/dL (ref 0.00–0.40)
Total Protein: 6.1 g/dL (ref 6.0–8.5)

## 2017-06-06 LAB — LIPID PANEL
Chol/HDL Ratio: 5.1 ratio — ABNORMAL HIGH (ref 0.0–4.4)
Cholesterol, Total: 233 mg/dL — ABNORMAL HIGH (ref 100–199)
HDL: 46 mg/dL (ref >39)
LDL Calculated: 156 mg/dL — ABNORMAL HIGH (ref 0–99)
Triglycerides: 153 mg/dL — ABNORMAL HIGH (ref 0–149)
VLDL Cholesterol Cal: 31 mg/dL (ref 5–40)

## 2017-06-06 LAB — VITAMIN D 25 HYDROXY (VIT D DEFICIENCY, FRACTURES): Vit D, 25-Hydroxy: 52.2 ng/mL (ref 30.0–100.0)

## 2017-06-12 ENCOUNTER — Ambulatory Visit (INDEPENDENT_AMBULATORY_CARE_PROVIDER_SITE_OTHER): Payer: Medicare Other | Admitting: Family Medicine

## 2017-06-12 ENCOUNTER — Ambulatory Visit (INDEPENDENT_AMBULATORY_CARE_PROVIDER_SITE_OTHER): Payer: Medicare Other

## 2017-06-12 ENCOUNTER — Telehealth: Payer: Self-pay | Admitting: Family Medicine

## 2017-06-12 ENCOUNTER — Encounter: Payer: Self-pay | Admitting: Family Medicine

## 2017-06-12 VITALS — BP 107/65 | HR 82 | Temp 97.3°F | Ht 63.5 in | Wt 187.1 lb

## 2017-06-12 DIAGNOSIS — I2510 Atherosclerotic heart disease of native coronary artery without angina pectoris: Secondary | ICD-10-CM | POA: Diagnosis not present

## 2017-06-12 DIAGNOSIS — S4992XA Unspecified injury of left shoulder and upper arm, initial encounter: Secondary | ICD-10-CM | POA: Diagnosis not present

## 2017-06-12 DIAGNOSIS — Z780 Asymptomatic menopausal state: Secondary | ICD-10-CM

## 2017-06-12 DIAGNOSIS — M25512 Pain in left shoulder: Secondary | ICD-10-CM

## 2017-06-12 DIAGNOSIS — Z13820 Encounter for screening for osteoporosis: Secondary | ICD-10-CM

## 2017-06-12 MED ORDER — BETAMETHASONE SOD PHOS & ACET 6 (3-3) MG/ML IJ SUSP
6.0000 mg | Freq: Once | INTRAMUSCULAR | Status: AC
  Administered 2017-06-12: 6 mg via INTRAMUSCULAR

## 2017-06-12 MED ORDER — TRAMADOL HCL 50 MG PO TABS: 50.0000 mg | ORAL_TABLET | Freq: Every day | ORAL | 0 refills | Status: DC

## 2017-06-12 NOTE — Progress Notes (Signed)
Chief Complaint  Patient presents with  . Shoulder Pain    pt here today c/o left shoulder pain after falling on it 5 days ago    HPI  Patient presents [place] for [place] the beach about 5 [place].  She fell backwards and landed on the ball of the left shoulder.  It has been slowly getting better.  But she is still having trouble with range of motion.  Pain has dropped from a 10 at onset to a 6/10.  The exception is that at night it still gets to an 8/10 when she is trying to sleep.  The pain is sharp with motion located in the lateral aspect of the deltoid and some anteriorly as well.  It hurts for abduction as well as flexion.  PMH: Smoking status noted ROS: Per HPI  Objective: BP 107/65   Pulse 82   Temp (!) 97.3 F (36.3 C) (Oral)   Ht 5' 3.5" (1.613 m)   Wt 187 lb 2 oz (84.9 kg)   BMI 32.63 kg/m  Gen: NAD, alert, cooperative with exam HEENT: NCAT, EOMI, PERRL Ext: No edema, warm.  There is markedly reduced range of motion for all parameters at the left upper extremity for the shoulder.  The joint is neurovascularly intact for pulses and touch.  Range of motion is approximately 20 degrees for abduction actively 60 degrees passively.  Similar for flexion.  She can extend 20 degrees past neutral without pain.  No bruising is noted. Neuro: Alert and oriented, No gross deficits  Assessment and plan:  1. Acute pain of left shoulder     Meds ordered this encounter  Medications  . betamethasone acetate-betamethasone sodium phosphate (CELESTONE) injection 6 mg  . traMADol (ULTRAM) 50 MG tablet    Sig: Take 1-2 tablets (50-100 mg total) by mouth at bedtime.    Dispense:  20 tablet    Refill:  0    Orders Placed This Encounter  Procedures  . DG Shoulder Left    Standing Status:   Future    Number of Occurrences:   1    Standing Expiration Date:   08/12/2018    Order Specific Question:   Reason for Exam (SYMPTOM  OR DIAGNOSIS REQUIRED)    Answer:   shoulder pain    Order  Specific Question:   Preferred imaging location?    Answer:   Internal    Follow up 2 weeks if not significantly improved.  Sling around the clock for the next week can wean after that time as improvement occurs.  Range of motion exercises 4 times daily and apply heat frequently. Claretta Fraise, MD

## 2017-06-12 NOTE — Telephone Encounter (Signed)
Talked with patient about results.

## 2017-06-13 DIAGNOSIS — Z13820 Encounter for screening for osteoporosis: Secondary | ICD-10-CM | POA: Diagnosis not present

## 2017-06-19 ENCOUNTER — Other Ambulatory Visit: Payer: Medicare Other

## 2017-06-26 ENCOUNTER — Ambulatory Visit (INDEPENDENT_AMBULATORY_CARE_PROVIDER_SITE_OTHER): Payer: Medicare Other | Admitting: Allergy and Immunology

## 2017-06-26 ENCOUNTER — Encounter: Payer: Self-pay | Admitting: Allergy and Immunology

## 2017-06-26 VITALS — BP 120/70 | HR 90 | Resp 16

## 2017-06-26 DIAGNOSIS — K219 Gastro-esophageal reflux disease without esophagitis: Secondary | ICD-10-CM

## 2017-06-26 DIAGNOSIS — I2510 Atherosclerotic heart disease of native coronary artery without angina pectoris: Secondary | ICD-10-CM

## 2017-06-26 DIAGNOSIS — J3089 Other allergic rhinitis: Secondary | ICD-10-CM | POA: Diagnosis not present

## 2017-06-26 DIAGNOSIS — T7800XD Anaphylactic reaction due to unspecified food, subsequent encounter: Secondary | ICD-10-CM | POA: Diagnosis not present

## 2017-06-26 DIAGNOSIS — J4530 Mild persistent asthma, uncomplicated: Secondary | ICD-10-CM

## 2017-06-26 MED ORDER — RANITIDINE HCL 300 MG PO TABS: 300.0000 mg | ORAL_TABLET | Freq: Every day | ORAL | 5 refills | Status: DC

## 2017-06-26 NOTE — Patient Instructions (Addendum)
  1. Allergen avoidance measures against mammal consumption  2. Every day use the following preventative medications:   A. loratadine 10 mg tablet twice a day  B. montelukast 10 mg tablet in evening  C. Omeprazole 40 mg tablet in AM  D. Ranitidine 300mg  in PM  3. If needed:   A. albuterol MDI 2 inhalations or albuterol nebulization every 4-6 hours  B. EpiPen, Benadryl, M.D./ER evaluation for allergic reaction  4. Visit with your GI to obtain upper endoscopy  5. Return to clinic in 6 months or earlier if problem  6. Obtain fall flu vaccine

## 2017-06-26 NOTE — Progress Notes (Signed)
Follow-up Note  Referring Provider: Chipper Herb, MD Primary Provider: Chipper Herb, MD Date of Office Visit: 06/26/2017  Subjective:   Leah [name] (DOB: Jul 03, 1950) is a 67 y.o. female who returns to the [facility] on 06/26/2017 in re-evaluation of the following:  HPI: Leah [name] returns to this clinic in evaluation of her alpha gal syndrome and her intermittent urticaria and LPR and history of asthma.  I last saw her in this clinic on for December 2018.  Concerning her respiratory tract, she apparently developed a viral respiratory tract infection in March 2019 that was a prolonged event of approximately 6 weeks requiring her to receive 2 azithromycin Z-Paks, doxycycline, and prednisone and administration of Symbicort.  Fortunately, she has resolved that issue completely and she no longer uses her Symbicort and has no need to use a short acting bronchodilator.  She can exert herself without any problem.  She has had very little problems with her nose.  She has not been having any allergic reactions or urticaria while remaining away from all mammal consumption.  She also tends to stay away from dairy products although she can eat baked dairy products and processed dairy products with no problem.  She has not been having any issues with her throat lately.  But her reflux or some type of GI issue is very active for the past 2 months.  Every time she eats a meal she gets epigastric upset and she vomits commonly.  This occurs even though she takes Zantac and omeprazole on a consistent basis and also adds Mylanta throughout the day.  It should be noted that all of this started after she used doxycycline for prolonged period in time earlier this spring.  She is scheduled to see her gastroenterologist about this issue.  Allergies as of 06/26/2017      Reactions   Codeine Nausea And Vomiting   Penicillins Other (See Comments)   Does not know   Praluent [alirocumab]    Rash and myalgias      Medication List      albuterol (2.5 MG/3ML) 0.083% nebulizer solution Commonly known as:  PROVENTIL Take 3 mLs (2.5 mg total) by nebulization every 6 (six) hours as needed for wheezing or shortness of breath.   albuterol 108 (90 Base) MCG/ACT inhaler Commonly known as:  PROVENTIL HFA;VENTOLIN HFA Inhale 2 puffs into the lungs every 6 (six) hours as needed for wheezing or shortness of breath.   aspirin 81 MG tablet Take 81 mg by mouth daily.   calcium carbonate 500 MG chewable tablet Commonly known as:  TUMS Chew 1 tablet (200 mg of elemental calcium total) by mouth as needed for indigestion or heartburn.   EPIPEN 2-PAK 0.3 mg/0.3 mL Soaj injection Generic drug:  EPINEPHrine Inject into the muscle once.   fish oil-omega-3 fatty acids 1000 MG capsule Take 2 g by mouth daily.   lisinopril-hydrochlorothiazide 20-12.5 MG tablet Commonly known as:  PRINZIDE,ZESTORETIC Take 1 tablet by mouth daily.   loratadine 10 MG tablet Commonly known as:  CLARITIN Take 10 mg by mouth 2 (two) times daily.   montelukast 10 MG tablet Commonly known as:  SINGULAIR Take 1 tablet (10 mg total) by mouth at bedtime.   omeprazole 40 MG capsule Commonly known as:  PRILOSEC Take 1 capsule (40 mg total) by mouth every morning.   ranitidine 150 MG tablet Commonly known as:  ZANTAC Take 150 mg by mouth 2 (two) times daily as  needed.   rosuvastatin 5 MG tablet Commonly known as:  CRESTOR Take 1 tablet by mouth 3 days a week as tolerated.   Vitamin D 2000 units tablet Take 2,000 Units by mouth daily.       Past Medical History:  Diagnosis Date  . Asthma   . CAD (coronary artery disease)   . Colon polyp   . Depression   . Food allergy   . GERD (gastroesophageal reflux disease)   . Hyperlipidemia   . Hypertension   . Urticaria     Past Surgical History:  Procedure Laterality Date  . ABDOMINAL HYSTERECTOMY    . Carpel Tunnel    . CHOLECYSTECTOMY    .  COLONOSCOPY  02/08/2005   [id number] hemorrhoids otherwise normal rectum/ A few scattered, shallow, left-sided sigmoid diverticula/The remainder of the colonic mucosa appeared normal  . COLONOSCOPY WITH ESOPHAGOGASTRODUODENOSCOPY (EGD) N/A 11/29/2012   Procedure: COLONOSCOPY WITH ESOPHAGOGASTRODUODENOSCOPY (EGD);  Surgeon: Daneil Dolin, MD;  Location: [facility];  Service: Endoscopy;  Laterality: N/A;  10:45  . CYST REMOVAL TRUNK    . HERNIA REPAIR     ventral X 2 with mesh, last one 2011 ([place]). complicated with 2 week hospital stay  . NISSEN FUNDOPLICATION  8315  . TUBAL LIGATION      Review of systems negative except as noted in HPI / PMHx or noted below:  Review of Systems  Constitutional: Negative.   HENT: Negative.   Eyes: Negative.   Respiratory: Negative.   Cardiovascular: Negative.   Gastrointestinal: Negative.   Genitourinary: Negative.   Musculoskeletal: Negative.   Skin: Negative.   Neurological: Negative.   Endo/Heme/Allergies: Negative.   Psychiatric/Behavioral: Negative.      Objective:   Vitals:   06/26/17 1431  BP: 120/70  Pulse: 90  Resp: 16  SpO2: 96%          Physical Exam  HENT:  Head: Normocephalic.  Right Ear: Tympanic membrane, external ear and ear canal normal.  Left Ear: Tympanic membrane, external ear and ear canal normal.  Nose: Nose normal. No mucosal edema or rhinorrhea.  Mouth/Throat: Uvula is midline, oropharynx is clear and moist and mucous membranes are normal. No oropharyngeal exudate.  Eyes: Conjunctivae are normal.  Neck: Trachea normal. No tracheal tenderness present. No tracheal deviation present. No thyromegaly present.  Cardiovascular: Normal rate, regular rhythm, S1 normal, S2 normal and normal heart sounds.  No murmur heard. Pulmonary/Chest: Breath sounds normal. No stridor. No respiratory distress. She has no wheezes. She has no rales.  Musculoskeletal: She exhibits no edema.  Lymphadenopathy:       Head  (right side): No tonsillar adenopathy present.       Head (left side): No tonsillar adenopathy present.    She has no cervical adenopathy.  Neurological: She is alert.  Skin: No rash noted. She is not diaphoretic. No erythema. Nails show no clubbing.    Diagnostics:    Spirometry was performed and demonstrated an FEV1 of 2.43 at 123 % of predicted.  The patient had an Asthma Control Test with the following results: ACT Total Score: 24.    Assessment and Plan:   1. Asthma, well controlled, mild persistent   2. Other allergic rhinitis   3. LPRD (laryngopharyngeal reflux disease)   4. Anaphylactic shock due to food, subsequent encounter     1. Allergen avoidance measures against mammal consumption  2. Every day use the following preventative medications:   A. loratadine 10 mg tablet twice  a day  B. montelukast 10 mg tablet in evening  C. Omeprazole 40 mg tablet in AM  D. Ranitidine 300mg  in PM  3. If needed:   A. albuterol MDI 2 inhalations or albuterol nebulization every 4-6 hours  B. EpiPen, Benadryl, M.D./ER evaluation for allergic reaction  4. Visit with your GI to obtain upper endoscopy  5. Return to clinic in 6 months or earlier if problem  6. Obtain fall flu vaccine  Aneliese appears to be doing relatively well with her respiratory track on her current plan and she appears to be doing relatively well with her alpha gal syndrome while avoiding mammal and to some degree dairy products.  She is not doing as well with her GI issue and she needs to visit with her gastroenterologist to figure out why she is having postprandial epigastric pain and vomiting.  I do not know why she is having this issue but it did start after using doxycycline and I guess it is possible that she may have developed some type of doxycycline induced ulcer in her esophageal mucosa although one would have expected this to have healed by this point in time.  I will see her back in this clinic in 6 months  or earlier if there is a problem.  Allena Katz, MD Allergy / Immunology [facility]

## 2017-06-27 ENCOUNTER — Encounter: Payer: Self-pay | Admitting: Allergy and Immunology

## 2017-07-06 ENCOUNTER — Ambulatory Visit (INDEPENDENT_AMBULATORY_CARE_PROVIDER_SITE_OTHER): Payer: Medicare Other | Admitting: Gastroenterology

## 2017-07-06 ENCOUNTER — Encounter: Payer: Self-pay | Admitting: Gastroenterology

## 2017-07-06 VITALS — BP 124/84 | HR 78 | Ht 63.5 in | Wt 188.6 lb

## 2017-07-06 DIAGNOSIS — R1013 Epigastric pain: Secondary | ICD-10-CM | POA: Diagnosis not present

## 2017-07-06 DIAGNOSIS — I2510 Atherosclerotic heart disease of native coronary artery without angina pectoris: Secondary | ICD-10-CM

## 2017-07-06 DIAGNOSIS — Z86010 Personal history of colonic polyps: Secondary | ICD-10-CM

## 2017-07-06 NOTE — Patient Instructions (Addendum)
If you are age 66 or older, your body mass index should be between 23-30. Your Body mass index is 32.88 kg/m. If this is out of the aforementioned range listed, please consider follow up with your Primary Care Provider.  If you are age 72 or younger, your body mass index should be between 19-25. Your Body mass index is 32.88 kg/m. If this is out of the aformentioned range listed, please consider follow up with your Primary Care Provider.   Please call back the second week of July to schedule endo/colon procedure with Dr. Ardis Hughs as his schedule is not available at this time.   Thank you for choosing me and [facility].   Alonza Bogus, PA-C

## 2017-07-06 NOTE — Progress Notes (Signed)
07/06/2017 Leah [name] [id number] 1950-09-24   Review of pertinent gastrointestinal problems: 1. Precancerous polyps: Colonoscopy 11/2012, Dr. Gala Romney, done for history of adenomas. Findings left-sided diverticulosis. 2 diminutive polyps, normal terminal ileum. The polyps were adenomatous on pathology. 2. Epigastric pain workup; Upper endoscopy 11/2012, Dr. Gala Romney, done for abdominal pain, epigastric. This was normal except for previous evidence of fundoplication surgery. He increased her as needed H2 blocker. 3. GERD-like epigastric pain; established with Dr. Ardis Hughs 2016: epigastric burning, CT scan abd/pelvis 07/2014 was normal. Labs 05/2014 cbc, cmet normal.  She was asked to take H2 blocker BID and call office 5-6 weeks to report on her response. We have not heard from her since then.    HISTORY OF PRESENT ILLNESS: This is a 67 year old female who is known to Dr. Ardis Hughs.  She presents here today with complaints of epigastric abdominal pain and intermittent vomiting.  She has had this abdominal pain for quite some time, in fact, it was evaluated back in 2014 by Dr. Gala Romney.  EGD at that time was normal except for previous evidence of fundoplication surgery.  She had also had previous CT scans that were normal.  When she was last seen by Dr. Ardis Hughs earlier this year he determined that no further evaluation was needed.  She comes in today saying that her pain has worsened.  She gets a severe epigastric abdominal pain every time she eats.  She describes it as a stabbing awful pain.  No pain with drinking fluids.  She says that sometimes she also vomits and it often feels better after vomiting.  She is on omeprazole 40 mg daily.  She was also previously on Zantac OTC.  She actually saw her allergist last week and was describing her symptoms.  He increased her Zantac to 300 mg at bedtime.  She feels that since that time her symptoms have improved very slightly, but still very comfortable.  Last  colonoscopy was November 2014 at which time she was found to have left-sided diverticulosis and 2 diminutive polyps were removed and were adenomatous on pathology.   Past Medical History:  Diagnosis Date  . Asthma   . CAD (coronary artery disease)   . Colon polyp   . Depression   . Food allergy   . GERD (gastroesophageal reflux disease)   . Hyperlipidemia   . Hypertension   . Urticaria    Past Surgical History:  Procedure Laterality Date  . ABDOMINAL HYSTERECTOMY    . Carpel Tunnel    . CHOLECYSTECTOMY    . COLONOSCOPY  02/08/2005   [id number] hemorrhoids otherwise normal rectum/ A few scattered, shallow, left-sided sigmoid diverticula/The remainder of the colonic mucosa appeared normal  . COLONOSCOPY WITH ESOPHAGOGASTRODUODENOSCOPY (EGD) N/A 11/29/2012   Procedure: COLONOSCOPY WITH ESOPHAGOGASTRODUODENOSCOPY (EGD);  Surgeon: Daneil Dolin, MD;  Location: [facility];  Service: Endoscopy;  Laterality: N/A;  10:45  . CYST REMOVAL TRUNK    . HERNIA REPAIR     ventral X 2 with mesh, last one 2011 ([place]). complicated with 2 week hospital stay  . NISSEN FUNDOPLICATION  2694  . TUBAL LIGATION      reports that she quit smoking about 21 years ago. Her smoking use included cigarettes. She has a 14.00 pack-year smoking history. She has never used smokeless tobacco. She reports that she drinks alcohol. She reports that she does not use drugs. family history includes Alzheimer's disease in her mother; Cancer in her cousin; Diabetes in her brother, mother,  and sister; Heart disease in her mother; Other in her father. Allergies  Allergen Reactions  . Codeine Nausea And Vomiting  . Penicillins Other (See Comments)    Does not know  . Praluent [Alirocumab]     Rash and myalgias      Outpatient Encounter Medications as of 07/06/2017  Medication Sig  . albuterol (PROVENTIL HFA;VENTOLIN HFA) 108 (90 Base) MCG/ACT inhaler Inhale 2 puffs into the lungs every 6 (six) hours as needed  for wheezing or shortness of breath.  Marland Kitchen albuterol (PROVENTIL) (2.5 MG/3ML) 0.083% nebulizer solution Take 3 mLs (2.5 mg total) by nebulization every 6 (six) hours as needed for wheezing or shortness of breath.  Marland Kitchen aspirin 81 MG tablet Take 81 mg by mouth daily.  . calcium carbonate (TUMS) 500 MG chewable tablet Chew 1 tablet (200 mg of elemental calcium total) by mouth as needed for indigestion or heartburn.  . Cholecalciferol (VITAMIN D) 2000 UNITS tablet Take 2,000 Units by mouth daily.  Marland Kitchen EPINEPHrine (EPIPEN 2-PAK) 0.3 mg/0.3 mL IJ SOAJ injection Inject into the muscle once.  . fish oil-omega-3 fatty acids 1000 MG capsule Take 2 g by mouth daily.   Marland Kitchen lisinopril-hydrochlorothiazide (PRINZIDE,ZESTORETIC) 20-12.5 MG tablet Take 1 tablet by mouth daily.  Marland Kitchen loratadine (CLARITIN) 10 MG tablet Take 10 mg by mouth 2 (two) times daily.  . montelukast (SINGULAIR) 10 MG tablet Take 1 tablet (10 mg total) by mouth at bedtime.  Marland Kitchen omeprazole (PRILOSEC) 40 MG capsule Take 1 capsule (40 mg total) by mouth every morning. (Patient taking differently: Take 40 mg by mouth daily. )  . ranitidine (ZANTAC) 300 MG tablet Take 1 tablet (300 mg total) by mouth at bedtime.  . rosuvastatin (CRESTOR) 5 MG tablet Take 1 tablet by mouth 3 days a week as tolerated.   No facility-administered encounter medications on file as of 07/06/2017.      REVIEW OF SYSTEMS  : All other systems reviewed and negative except where noted in the History of Present Illness.   PHYSICAL EXAM: BP 124/84   Pulse 78   Ht 5' 3.5" (1.613 m)   Wt 188 lb 9.6 oz (85.5 kg)   SpO2 98%   BMI 32.88 kg/m  General: Well developed black female in no acute distress Head: Normocephalic and atraumatic Eyes:  Sclerae anicteric, conjunctiva pink. Ears: Normal auditory acuity Lungs: Clear throughout to auscultation; no increased WOB. Heart: Regular rate and rhythm; no M/R/G. Abdomen: Soft, non-distended.  Laparotomy scars noted on abdomen.  BS present.   Epigastric TTP. Rectal:  Will be done at the time of colonoscopy. Musculoskeletal: Symmetrical with no gross deformities  Skin: No lesions on visible extremities Extremities: No edema  Neurological: Alert oriented x 4, grossly non-focal Psychological:  Alert and cooperative. Normal mood and affect  ASSESSMENT AND PLAN: *Epigastric abdominal pain and episodic vomiting:  I actually think that she may be having some symptoms from adhesions/scar tissue, but will repeat EGD with Dr. Ardis Hughs to rule out ulcer, etc.  She thinks that symptoms are slightly better since increasing zantac dose one week ago.  Will continue current regimen for now. *Personal history of colon polyps:  Adenomatous polyps in 2014.  Will schedule for colonoscopy with Dr. Ardis Hughs.     CC:  Chipper Herb, MD

## 2017-07-08 NOTE — Progress Notes (Signed)
I agree with the above note, plan 

## 2017-07-10 ENCOUNTER — Telehealth: Payer: Self-pay | Admitting: Family Medicine

## 2017-07-10 NOTE — Telephone Encounter (Signed)
error 

## 2017-07-11 ENCOUNTER — Ambulatory Visit (INDEPENDENT_AMBULATORY_CARE_PROVIDER_SITE_OTHER): Payer: Medicare Other | Admitting: Physician Assistant

## 2017-07-11 ENCOUNTER — Encounter: Payer: Self-pay | Admitting: Physician Assistant

## 2017-07-11 VITALS — BP 124/73 | HR 75 | Temp 96.8°F | Ht 63.5 in | Wt 186.0 lb

## 2017-07-11 DIAGNOSIS — Z01419 Encounter for gynecological examination (general) (routine) without abnormal findings: Secondary | ICD-10-CM | POA: Diagnosis not present

## 2017-07-11 DIAGNOSIS — N816 Rectocele: Secondary | ICD-10-CM

## 2017-07-11 DIAGNOSIS — M19012 Primary osteoarthritis, left shoulder: Secondary | ICD-10-CM

## 2017-07-11 MED ORDER — LIDOCAINE 5 % EX PTCH: 1.0000 | MEDICATED_PATCH | CUTANEOUS | 0 refills | Status: DC

## 2017-07-11 MED ORDER — METHYLPREDNISOLONE ACETATE 80 MG/ML IJ SUSP
80.0000 mg | Freq: Once | INTRAMUSCULAR | Status: AC
  Administered 2017-07-11: 80 mg via INTRAMUSCULAR

## 2017-07-11 NOTE — Patient Instructions (Signed)
Rotator Cuff Injury Rotator cuff injury is any type of injury to the set of muscles and tendons that make up the stabilizing unit of your shoulder. This unit holds the ball of your upper arm bone (humerus) in the socket of your shoulder blade (scapula). What are the causes? Injuries to your rotator cuff most commonly come from sports or activities that cause your arm to be moved repeatedly over your head. Examples of this include throwing, weight lifting, swimming, or racquet sports. Long lasting (chronic) irritation of your rotator cuff can cause soreness and swelling (inflammation), bursitis, and eventual damage to your tendons, such as a tear (rupture). What are the signs or symptoms? Acute rotator cuff tear:  Sudden tearing sensation followed by severe pain shooting from your upper shoulder down your arm toward your elbow.  Decreased range of motion of your shoulder because of pain and muscle spasm.  Severe pain.  Inability to raise your arm out to the side because of pain and loss of muscle power (large tears).  Chronic rotator cuff tear:  Pain that usually is worse at night and may interfere with sleep.  Gradual weakness and decreased shoulder motion as the pain worsens.  Decreased range of motion.  Rotator cuff tendinitis:  Deep ache in your shoulder and the outside upper arm over your shoulder.  Pain that comes on gradually and becomes worse when lifting your arm to the side or turning it inward.  How is this diagnosed? Rotator cuff injury is diagnosed through a medical history, physical exam, and imaging exam. The medical history helps determine the type of rotator cuff injury. Your health care provider will look at your injured shoulder, feel the injured area, and ask you to move your shoulder in different positions. X-ray exams typically are done to rule out other causes of shoulder pain, such as fractures. MRI is the exam of choice for the most severe shoulder injuries  because the images show muscles and tendons. How is this treated? Chronic tear:  Medicine for pain, such as acetaminophen or ibuprofen.  Physical therapy and range-of-motion exercises may be helpful in maintaining shoulder function and strength.  Steroid injections into your shoulder joint.  Surgical repair of the rotator cuff if the injury does not heal with noninvasive treatment.  Acute tear:  Anti-inflammatory medicines such as ibuprofen and naproxen to help reduce pain and swelling.  A sling to help support your arm and rest your rotator cuff muscles. Long-term use of a sling is not advised. It may cause significant stiffening of the shoulder joint.  Surgery may be considered within a few weeks, especially in younger, active people, to return the shoulder to full function.  Indications for surgical treatment include the following: ? Age younger than 20 years. ? Rotator cuff tears that are complete. ? Physical therapy, rest, and anti-inflammatory medicines have been used for 6-8 weeks, with no improvement. ? Employment or sporting activity that requires constant shoulder use.  Tendinitis:  Anti-inflammatory medicines such as ibuprofen and naproxen to help reduce pain and swelling.  A sling to help support your arm and rest your rotator cuff muscles. Long-term use of a sling is not advised. It may cause significant stiffening of the shoulder joint.  Severe tendinitis may require: ? Steroid injections into your shoulder joint. ? Physical therapy. ? Surgery.  Follow these instructions at home:  Apply ice to your injury: ? Put ice in a plastic bag. ? Place a towel between your skin and the bag. ?  Leave the ice on for 20 minutes, 2-3 times a day.  If you have a shoulder immobilizer (sling and straps), wear it until told otherwise by your health care provider.  You may want to sleep on several pillows or in a recliner at night to lessen swelling and pain.  Only take  over-the-counter or prescription medicines for pain, discomfort, or fever as directed by your health care provider.  Do simple hand squeezing exercises with a soft rubber ball to decrease hand swelling. Contact a health care provider if:  Your shoulder pain increases, or new pain or numbness develops in your arm, hand, or fingers.  Your hand or fingers are colder than your other hand. Get help right away if:  Your arm, hand, or fingers are numb or tingling.  Your arm, hand, or fingers are increasingly swollen and painful, or they turn white or blue. This information is not intended to replace advice given to you by your health care provider. Make sure you discuss any questions you have with your health care provider. Document Released: 12/31/1999 Document Revised: 06/10/2015 Document Reviewed: 08/14/2012 Elsevier Interactive Patient Education  2018 [employer]. Kegel Exercises Kegel exercises help strengthen the muscles that support the rectum, vagina, small intestine, bladder, and uterus. Doing Kegel exercises can help:  Improve bladder and bowel control.  Improve sexual response.  Reduce problems and discomfort during pregnancy.  Kegel exercises involve squeezing your pelvic floor muscles, which are the same muscles you squeeze when you try to stop the flow of urine. The exercises can be done while sitting, standing, or lying down, but it is best to vary your position. Phase 1 exercises 1. Squeeze your pelvic floor muscles tight. You should feel a tight lift in your rectal area. If you are a female, you should also feel a tightness in your vaginal area. Keep your stomach, buttocks, and legs relaxed. 2. Hold the muscles tight for up to 10 seconds. 3. Relax your muscles. Repeat this exercise 50 times a day or as many times as told by your health care provider. Continue to do this exercise for at least 4-6 weeks or for as long as told by your health care provider. This information is  not intended to replace advice given to you by your health care provider. Make sure you discuss any questions you have with your health care provider. Document Released: 12/20/2011 Document Revised: 08/28/2015 Document Reviewed: 11/22/2014 Elsevier Interactive Patient Education  [employer].

## 2017-07-11 NOTE — Progress Notes (Signed)
depomedrol

## 2017-07-13 DIAGNOSIS — N816 Rectocele: Secondary | ICD-10-CM | POA: Insufficient documentation

## 2017-07-13 DIAGNOSIS — M19012 Primary osteoarthritis, left shoulder: Secondary | ICD-10-CM | POA: Insufficient documentation

## 2017-07-13 DIAGNOSIS — K625 Hemorrhage of anus and rectum: Secondary | ICD-10-CM | POA: Insufficient documentation

## 2017-07-13 NOTE — Progress Notes (Signed)
BP 124/73   Pulse 75   Temp (!) 96.8 F (36 C) (Oral)   Ht 5' 3.5" (1.613 m)   Wt 186 lb (84.4 kg)   BMI 32.43 kg/m    Subjective:    Patient ID: Leah [name], female    DOB: 1950/11/02, 67 y.o.   MRN: [id number]  HPI: Leah [name] is a 67 y.o. female presenting on 07/11/2017 for Gynecologic Exam (Pelvic Exam for Dr. Laurance Flatten )  This patient comes in for annual well physical examination. All medications are reviewed today. There are no reports of any problems with the medications. All of the medical conditions are reviewed and updated.  Lab work is reviewed and will be ordered as medically necessary. There are no new problems reported with today's visit.  Patient reports doing well overall.  The patient did have a fall several weeks ago and injured her left shoulder.  It is not been doing much better.  She was seen one time and given some inflammation medicine but has not made much difference.  She has significant difficulty reaching overhead and to her low back.  We have discussed the possibility of rotator cuff injury.  She would like to have orthopedic referral and this will be placed.  Patient also complaining of a bulge at the area between her vaginal opening and anus.  On further examination the patient does have a rectocele.  Past Medical History:  Diagnosis Date  . Asthma   . CAD (coronary artery disease)   . Colon polyp   . Depression   . Food allergy   . GERD (gastroesophageal reflux disease)   . Hyperlipidemia   . Hypertension   . Urticaria    Relevant past medical, surgical, family and social history reviewed and updated as indicated. Interim medical history since our last visit reviewed. Allergies and medications reviewed and updated. DATA REVIEWED: CHART IN EPIC  Family History reviewed for pertinent findings.  Review of Systems  Constitutional: Negative.  Negative for activity change, fatigue and fever.  HENT: Negative.   Eyes: Negative.   Respiratory:  Negative.  Negative for cough.   Cardiovascular: Negative.  Negative for chest pain.  Gastrointestinal: Negative.  Negative for abdominal pain.  Endocrine: Negative.   Genitourinary: Positive for vaginal pain. Negative for dysuria.  Musculoskeletal: Positive for arthralgias, joint swelling and myalgias.  Skin: Negative.   Neurological: Negative.     Allergies as of 07/11/2017      Reactions   Codeine Nausea And Vomiting   Penicillins Other (See Comments)   Does not know   Praluent [alirocumab]    Rash and myalgias      Medication List        Accurate as of 07/11/17 11:59 PM. Always use your most recent med list.          albuterol (2.5 MG/3ML) 0.083% nebulizer solution Commonly known as:  PROVENTIL Take 3 mLs (2.5 mg total) by nebulization every 6 (six) hours as needed for wheezing or shortness of breath.   albuterol 108 (90 Base) MCG/ACT inhaler Commonly known as:  PROVENTIL HFA;VENTOLIN HFA Inhale 2 puffs into the lungs every 6 (six) hours as needed for wheezing or shortness of breath.   aspirin 81 MG tablet Take 81 mg by mouth daily.   calcium carbonate 500 MG chewable tablet Commonly known as:  TUMS Chew 1 tablet (200 mg of elemental calcium total) by mouth as needed for indigestion or heartburn.   EPIPEN 2-PAK 0.3  mg/0.3 mL Soaj injection Generic drug:  EPINEPHrine Inject into the muscle once.   fish oil-omega-3 fatty acids 1000 MG capsule Take 2 g by mouth daily.   lidocaine 5 % Commonly known as:  LIDODERM Place 1 patch onto the skin daily. Remove & Discard patch within 12 hours or as directed by MD   lisinopril-hydrochlorothiazide 20-12.5 MG tablet Commonly known as:  PRINZIDE,ZESTORETIC Take 1 tablet by mouth daily.   loratadine 10 MG tablet Commonly known as:  CLARITIN Take 10 mg by mouth 2 (two) times daily.   montelukast 10 MG tablet Commonly known as:  SINGULAIR Take 1 tablet (10 mg total) by mouth at bedtime.   omeprazole 40 MG  capsule Commonly known as:  PRILOSEC Take 1 capsule (40 mg total) by mouth every morning.   ranitidine 300 MG tablet Commonly known as:  ZANTAC Take 1 tablet (300 mg total) by mouth at bedtime.   rosuvastatin 5 MG tablet Commonly known as:  CRESTOR Take 1 tablet by mouth 3 days a week as tolerated.   Vitamin D 2000 units tablet Take 2,000 Units by mouth daily.          Objective:    BP 124/73   Pulse 75   Temp (!) 96.8 F (36 C) (Oral)   Ht 5' 3.5" (1.613 m)   Wt 186 lb (84.4 kg)   BMI 32.43 kg/m   Allergies  Allergen Reactions  . Codeine Nausea And Vomiting  . Penicillins Other (See Comments)    Does not know  . Praluent [Alirocumab]     Rash and myalgias    Wt Readings from Last 3 Encounters:  07/11/17 186 lb (84.4 kg)  07/06/17 188 lb 9.6 oz (85.5 kg)  06/12/17 187 lb 2 oz (84.9 kg)    Physical Exam  Constitutional: She is oriented to person, place, and time. She appears well-developed and well-nourished.  HENT:  Head: Normocephalic and atraumatic.  Eyes: Pupils are equal, round, and reactive to light. Conjunctivae and EOM are normal.  Neck: Normal range of motion. Neck supple.  Cardiovascular: Normal rate, regular rhythm, normal heart sounds and intact distal pulses.  Pulmonary/Chest: Effort normal and breath sounds normal. Right breast exhibits no mass, no skin change and no tenderness. Left breast exhibits no mass, no skin change and no tenderness. No breast tenderness, discharge or bleeding. Breasts are symmetrical.  Abdominal: Soft. Bowel sounds are normal.  Genitourinary: Vagina normal and uterus normal. Rectal exam shows no fissure.    No breast tenderness, discharge or bleeding. There is no tenderness or lesion on the right labia. There is no tenderness or lesion on the left labia. Uterus is not deviated, not enlarged and not tender. Cervix exhibits no motion tenderness, no discharge and no friability. Right adnexum displays no mass, no tenderness and  no fullness. Left adnexum displays no mass, no tenderness and no fullness. No tenderness or bleeding in the vagina. No vaginal discharge found.  Genitourinary Comments: Rectocele present and more pronounced when coughing and bearing down  Musculoskeletal:       Left shoulder: She exhibits decreased range of motion, tenderness and spasm.  Neurological: She is alert and oriented to person, place, and time. She has normal reflexes.  Skin: Skin is warm and dry. No rash noted.  Psychiatric: She has a normal mood and affect. Her behavior is normal. Judgment and thought content normal.    Results for orders placed or performed in visit on 06/04/17  Rocky mtn spotted  fvr abs pnl(IgG+IgM)  Result Value Ref Range   RMSF IgG Positive (A) Negative   RMSF IgM 0.30 0.00 - 0.89 index  Lyme Ab/Western Blot Reflex  Result Value Ref Range   Lyme IgG/IgM Ab <0.91 0.00 - 0.90 ISR   LYME DISEASE AB, QUANT, IGM <0.80 0.00 - 0.79 index  Hepatic function panel  Result Value Ref Range   Total Protein 6.1 6.0 - 8.5 g/dL   Albumin 3.7 3.6 - 4.8 g/dL   Bilirubin Total 0.3 0.0 - 1.2 mg/dL   Bilirubin, Direct 0.10 0.00 - 0.40 mg/dL   Alkaline Phosphatase 72 39 - 117 IU/L   AST 21 0 - 40 IU/L   ALT 22 0 - 32 IU/L  VITAMIN D 25 Hydroxy (Vit-D Deficiency, Fractures)  Result Value Ref Range   Vit D, 25-Hydroxy 52.2 30.0 - 100.0 ng/mL  Lipid panel  Result Value Ref Range   Cholesterol, Total 233 (H) 100 - 199 mg/dL   Triglycerides 153 (H) 0 - 149 mg/dL   HDL 46 >39 mg/dL   VLDL Cholesterol Cal 31 5 - 40 mg/dL   LDL Calculated 156 (H) 0 - 99 mg/dL   Chol/HDL Ratio 5.1 (H) 0.0 - 4.4 ratio  CBC with Differential/Platelet  Result Value Ref Range   WBC 2.6 (L) 3.4 - 10.8 x10E3/uL   RBC 4.41 3.77 - 5.28 x10E6/uL   Hemoglobin 12.1 11.1 - 15.9 g/dL   Hematocrit 36.6 34.0 - 46.6 %   MCV 83 79 - 97 fL   MCH 27.4 26.6 - 33.0 pg   MCHC 33.1 31.5 - 35.7 g/dL   RDW 15.2 12.3 - 15.4 %   Platelets 259 150 - 450  x10E3/uL   Neutrophils 50 Not Estab. %   Lymphs 40 Not Estab. %   Monocytes 7 Not Estab. %   Eos 2 Not Estab. %   Basos 1 Not Estab. %   Neutrophils Absolute 1.3 (L) 1.4 - 7.0 x10E3/uL   Lymphocytes Absolute 1.0 0.7 - 3.1 x10E3/uL   Monocytes Absolute 0.2 0.1 - 0.9 x10E3/uL   EOS (ABSOLUTE) 0.1 0.0 - 0.4 x10E3/uL   Basophils Absolute 0.0 0.0 - 0.2 x10E3/uL   Immature Granulocytes 0 Not Estab. %   Immature Grans (Abs) 0.0 0.0 - 0.1 x10E3/uL  BMP8+EGFR  Result Value Ref Range   Glucose 91 65 - 99 mg/dL   BUN 11 8 - 27 mg/dL   Creatinine, Ser 0.93 0.57 - 1.00 mg/dL   GFR calc non Af Amer 65 >59 mL/min/1.73   GFR calc Af Amer 75 >59 mL/min/1.73   BUN/Creatinine Ratio 12 12 - 28   Sodium 144 134 - 144 mmol/L   Potassium 3.8 3.5 - 5.2 mmol/L   Chloride 102 96 - 106 mmol/L   CO2 26 20 - 29 mmol/L   Calcium 9.1 8.7 - 10.3 mg/dL  RMSF, IgG, IFA  Result Value Ref Range   RMSF, IGG, IFA <1:64 Neg <1:64      Assessment & Plan:   1. Well female exam with routine gynecological exam Recheck in 1 year  2. Rectocele Keagle exercises discussed  3. Arthropathy of left shoulder - lidocaine (LIDODERM) 5 %; Place 1 patch onto the skin daily. Remove & Discard patch within 12 hours or as directed by MD  Dispense: 20 patch; Refill: 0 - methylPREDNISolone acetate (DEPO-MEDROL) injection 80 mg - Ambulatory referral to Orthopedic Surgery   Continue all other maintenance medications as listed above.  Follow up plan:  No follow-ups on file.  Educational handout given for rotator cuff injury  Terald Sleeper PA-C [facility] [address]  [place],  [address] [phone]   07/13/2017, 10:43 AM

## 2017-07-30 DIAGNOSIS — M25512 Pain in left shoulder: Secondary | ICD-10-CM | POA: Diagnosis not present

## 2017-07-31 DIAGNOSIS — H2513 Age-related nuclear cataract, bilateral: Secondary | ICD-10-CM | POA: Diagnosis not present

## 2017-07-31 DIAGNOSIS — H524 Presbyopia: Secondary | ICD-10-CM | POA: Diagnosis not present

## 2017-07-31 DIAGNOSIS — H04123 Dry eye syndrome of bilateral lacrimal glands: Secondary | ICD-10-CM | POA: Diagnosis not present

## 2017-08-08 ENCOUNTER — Encounter: Payer: Self-pay | Admitting: Family Medicine

## 2017-08-08 ENCOUNTER — Ambulatory Visit (INDEPENDENT_AMBULATORY_CARE_PROVIDER_SITE_OTHER): Payer: Medicare Other | Admitting: Family Medicine

## 2017-08-08 VITALS — BP 124/73 | HR 75 | Temp 96.8°F | Ht 63.5 in | Wt 183.0 lb

## 2017-08-08 DIAGNOSIS — Z0000 Encounter for general adult medical examination without abnormal findings: Secondary | ICD-10-CM

## 2017-08-08 NOTE — Patient Instructions (Signed)
  Leah [name] , Thank you for taking time to come for your Medicare Wellness Visit. I appreciate your ongoing commitment to your health goals. Please review the following plan we discussed and let me know if I can assist you in the future.   These are the goals we discussed: Goals    . Increase physical activity     Start exercising again Find a companion     . Prevent falls     Stay active       This is a list of the screening recommended for you and due dates:  Health Maintenance  Topic Date Due  . Pneumonia vaccines (1 of 2 - PCV13) 09/29/2016  . Stool Blood Test  09/05/2017*  . Flu Shot  08/16/2017  . Pap Smear  02/21/2018  . Mammogram  02/13/2019  . DEXA scan (bone density measurement)  06/14/2022  . Colon Cancer Screening  11/30/2022  . Tetanus Vaccine  02/15/2026  .  Hepatitis C: One time screening is recommended by Center for Disease Control  (CDC) for  adults born from [birth year] through 1964.   Completed  . HIV Screening  Completed  *Topic was postponed. The date shown is not the original due date.    Keep follow up with Dr Laurance Flatten and other specialist

## 2017-08-08 NOTE — Progress Notes (Addendum)
Subjective:    Leah [name] is a 67 y.o. female who presents for a Welcome to Medicare exam. She is a private caregiver for patients. She enjoys working in her yard and caring for others. She is not exercing regularly at this time. She states that her diet is semi-healthy. She is active at church and she lives at home by herself. She has 2 sons and had one daughter that passed away. We discussed fall risks and hazards today. She states that her health is about the same as a year ago.      Objective:    Today's Vitals   08/08/17 1631  Weight: 183 lb (83 kg)  Height: 5' 3.5" (1.613 m)  Body mass index is 31.91 kg/m.  Medications Outpatient Encounter Medications as of 08/08/2017  Medication Sig  . albuterol (PROVENTIL HFA;VENTOLIN HFA) 108 (90 Base) MCG/ACT inhaler Inhale 2 puffs into the lungs every 6 (six) hours as needed for wheezing or shortness of breath.  Marland Kitchen albuterol (PROVENTIL) (2.5 MG/3ML) 0.083% nebulizer solution Take 3 mLs (2.5 mg total) by nebulization every 6 (six) hours as needed for wheezing or shortness of breath.  Marland Kitchen aspirin 81 MG tablet Take 81 mg by mouth daily.  . calcium carbonate (TUMS) 500 MG chewable tablet Chew 1 tablet (200 mg of elemental calcium total) by mouth as needed for indigestion or heartburn.  . Cholecalciferol (VITAMIN D) 2000 UNITS tablet Take 2,000 Units by mouth daily.  Marland Kitchen EPINEPHrine (EPIPEN 2-PAK) 0.3 mg/0.3 mL IJ SOAJ injection Inject into the muscle once.  . fish oil-omega-3 fatty acids 1000 MG capsule Take 2 g by mouth daily.   Marland Kitchen lidocaine (LIDODERM) 5 % Place 1 patch onto the skin daily. Remove & Discard patch within 12 hours or as directed by MD  . lisinopril-hydrochlorothiazide (PRINZIDE,ZESTORETIC) 20-12.5 MG tablet Take 1 tablet by mouth daily.  Marland Kitchen loratadine (CLARITIN) 10 MG tablet Take 10 mg by mouth 2 (two) times daily.  . montelukast (SINGULAIR) 10 MG tablet Take 1 tablet (10 mg total) by mouth at bedtime.  Marland Kitchen omeprazole (PRILOSEC) 40  MG capsule Take 1 capsule (40 mg total) by mouth every morning. (Patient taking differently: Take 40 mg by mouth daily. )  . ranitidine (ZANTAC) 300 MG tablet Take 1 tablet (300 mg total) by mouth at bedtime.  . rosuvastatin (CRESTOR) 5 MG tablet Take 1 tablet by mouth 3 days a week as tolerated.   No facility-administered encounter medications on file as of 08/08/2017.      History: Past Medical History:  Diagnosis Date  . Allergy to alpha-gal   . Asthma   . CAD (coronary artery disease)   . Colon polyp   . Depression   . Food allergy   . GERD (gastroesophageal reflux disease)   . Hyperlipidemia   . Hypertension   . Urticaria    Past Surgical History:  Procedure Laterality Date  . ABDOMINAL HYSTERECTOMY    . Carpel Tunnel    . CHOLECYSTECTOMY    . COLONOSCOPY  02/08/2005   HER:DEYCXKGY hemorrhoids otherwise normal rectum/ A few scattered, shallow, left-sided sigmoid diverticula/The remainder of the colonic mucosa appeared normal  . COLONOSCOPY WITH ESOPHAGOGASTRODUODENOSCOPY (EGD) N/A 11/29/2012   Procedure: COLONOSCOPY WITH ESOPHAGOGASTRODUODENOSCOPY (EGD);  Surgeon: Daneil Dolin, MD;  Location: [facility];  Service: Endoscopy;  Laterality: N/A;  10:45  . CYST REMOVAL TRUNK    . HERNIA REPAIR     ventral X 2 with mesh, last one 2011 ([place]).  complicated with 2 week hospital stay  . NISSEN FUNDOPLICATION  3428  . TUBAL LIGATION      Family History  Problem Relation Age of Onset  . Heart disease Mother   . Alzheimer's disease Mother   . Diabetes Mother   . Other Father        deceased age 97 from some sort of GI problems but no malignancy  . Diabetes Sister   . Asthma Sister   . Diabetes Brother   . Cancer Cousin        pancreatic  . Myasthenia gravis Daughter   . Diabetes Daughter   . Colon cancer Neg Hx   . Stomach cancer Neg Hx    Social History   Occupational History  . Occupation: private duty Manufacturing systems engineer  Tobacco Use  . Smoking status:  Former Smoker    Packs/day: 0.50    Years: 28.00    Pack years: 14.00    Types: Cigarettes    Last attempt to quit: 01/17/1996    Years since quitting: 21.5  . Smokeless tobacco: Never Used  Substance and Sexual Activity  . Alcohol use: Yes    Alcohol/week: 0.0 oz    Comment: occasional use of wine   . Drug use: No  . Sexual activity: Yes    Partners: Male    Tobacco Counseling Counseling given: Not Answered   Immunizations and Health Maintenance Immunization History  Administered Date(s) Administered  . Influenza,inj,Quad PF,6+ Mos 10/08/2012, 11/26/2013, 10/22/2014, 10/27/2015, 10/18/2016  . Pneumococcal Polysaccharide-23 10/17/2011  . Tdap 02/16/2016   Health Maintenance Due  Topic Date Due  . PNA vac Low Risk Adult (1 of 2 - PCV13) 09/29/2016    Activities of Daily Living In your present state of health, do you have any difficulty performing the following activities: 08/08/2017 01/17/2017  Hearing? N N  Vision? Y Y  Comment wears rx glasses -  Difficulty concentrating or making decisions? N N  Walking or climbing stairs? N N  Dressing or bathing? N N  Doing errands, shopping? N N  Preparing Food and eating ? N -  Using the Toilet? N -  In the past six months, have you accidently leaked urine? N -  Do you have problems with loss of bowel control? N -  Managing your Medications? N -  Managing your Finances? N -  Housekeeping or managing your Housekeeping? N -  Some recent data might be hidden    Advanced Directives: Does Patient Have a Medical Advance Directive?: No Does patient want to make changes to medical advance directive?: Yes (MAU/Ambulatory/Procedural Areas - Information given)    Assessment:    This is a routine wellness examination for this patient .   Vision/Hearing screen No exam data present she follows the eye dr regularly  Dietary issues and exercise activities discussed:  Current Exercise Habits: The patient does not participate in regular  exercise at present  Goals    . Increase physical activity     Start exercising again Find a companion     . Prevent falls     Stay active      Depression Screen PHQ 2/9 Scores 08/08/2017 07/11/2017 06/01/2017 04/05/2017  PHQ - 2 Score 0 0 0 0  PHQ- 9 Score - - - -     Fall Risk Fall Risk  08/08/2017  Falls in the past year? Yes  Number falls in past yr: 2 or more  Injury with Fall? No  Cognitive Function: MMSE - Mini Mental State Exam 08/08/2017  Orientation to time 5  Orientation to Place 5  Registration 3  Attention/ Calculation 5  Recall 3  Language- name 2 objects 2  Language- repeat 1  Language- follow 3 step command 3  Language- read & follow direction 1  Write a sentence 1  Copy design 1  Total score 30        Patient Care Team: Chipper Herb, MD as PCP - General (Family Medicine) Burnell Blanks, MD as PCP - Cardiology (Cardiology) Gala Romney Cristopher Estimable, MD as Consulting Physician (Gastroenterology)     Plan:   pt is to follow up with PCP and other specialist.  She is up to date on health maintenance.  She is to stay active and try to prevent falls.   I have personally reviewed and noted the following in the patient's chart:   . Medical and social history . Use of alcohol, tobacco or illicit drugs  . Current medications and supplements . Functional ability and status . Nutritional status . Physical activity . Advanced directives . List of other physicians . Hospitalizations, surgeries, and ER visits in previous 12 months . Vitals . Screenings to include cognitive, depression, and falls . Referrals and appointments  In addition, I have reviewed and discussed with patient certain preventive protocols, quality metrics, and best practice recommendations. A written personalized care plan for preventive services as well as general preventive health recommendations were provided to patient.     Huxton Glaus, Cameron Proud, LPN [id number]/[id number]  I have  reviewed and agree with the above AWV documentation.   Arrie Senate MD

## 2017-08-15 DIAGNOSIS — M25511 Pain in right shoulder: Secondary | ICD-10-CM | POA: Diagnosis not present

## 2017-08-15 DIAGNOSIS — M25512 Pain in left shoulder: Secondary | ICD-10-CM | POA: Diagnosis not present

## 2017-08-21 DIAGNOSIS — M25512 Pain in left shoulder: Secondary | ICD-10-CM | POA: Diagnosis not present

## 2017-08-21 DIAGNOSIS — M7542 Impingement syndrome of left shoulder: Secondary | ICD-10-CM | POA: Diagnosis not present

## 2017-09-03 DIAGNOSIS — M7542 Impingement syndrome of left shoulder: Secondary | ICD-10-CM | POA: Diagnosis not present

## 2017-09-07 DIAGNOSIS — M7542 Impingement syndrome of left shoulder: Secondary | ICD-10-CM | POA: Diagnosis not present

## 2017-09-10 DIAGNOSIS — M7542 Impingement syndrome of left shoulder: Secondary | ICD-10-CM | POA: Diagnosis not present

## 2017-09-12 DIAGNOSIS — M7542 Impingement syndrome of left shoulder: Secondary | ICD-10-CM | POA: Diagnosis not present

## 2017-09-19 DIAGNOSIS — M7542 Impingement syndrome of left shoulder: Secondary | ICD-10-CM | POA: Diagnosis not present

## 2017-09-21 DIAGNOSIS — M7542 Impingement syndrome of left shoulder: Secondary | ICD-10-CM | POA: Diagnosis not present

## 2017-09-24 ENCOUNTER — Other Ambulatory Visit: Payer: Self-pay | Admitting: Allergy and Immunology

## 2017-09-24 DIAGNOSIS — M791 Myalgia, unspecified site: Secondary | ICD-10-CM

## 2017-09-24 DIAGNOSIS — Z91018 Allergy to other foods: Secondary | ICD-10-CM

## 2017-09-24 DIAGNOSIS — L500 Allergic urticaria: Secondary | ICD-10-CM

## 2017-09-27 ENCOUNTER — Other Ambulatory Visit: Payer: Self-pay

## 2017-09-27 ENCOUNTER — Ambulatory Visit (AMBULATORY_SURGERY_CENTER): Payer: Self-pay | Admitting: *Deleted

## 2017-09-27 VITALS — Ht 64.0 in | Wt 186.0 lb

## 2017-09-27 DIAGNOSIS — M7542 Impingement syndrome of left shoulder: Secondary | ICD-10-CM | POA: Diagnosis not present

## 2017-09-27 DIAGNOSIS — R1013 Epigastric pain: Secondary | ICD-10-CM

## 2017-09-27 DIAGNOSIS — Z86010 Personal history of colonic polyps: Secondary | ICD-10-CM

## 2017-09-27 MED ORDER — PEG 3350-KCL-NA BICARB-NACL 420 G PO SOLR: 4000.0000 mL | Freq: Once | ORAL | 0 refills | Status: AC

## 2017-09-27 NOTE — Progress Notes (Signed)
No egg or soy allergy known to patient  No issues with past sedation with any surgeries  or procedures, no intubation problems  No diet pills per patient No home 02 use per patient  No blood thinners per patient  Pt denies issues with constipation  No A fib or A flutter  EMMI video sent to pt's e mail  

## 2017-10-01 DIAGNOSIS — M67814 Other specified disorders of tendon, left shoulder: Secondary | ICD-10-CM | POA: Diagnosis not present

## 2017-10-01 DIAGNOSIS — M7542 Impingement syndrome of left shoulder: Secondary | ICD-10-CM | POA: Diagnosis not present

## 2017-10-09 ENCOUNTER — Ambulatory Visit (AMBULATORY_SURGERY_CENTER): Payer: Medicare Other | Admitting: Gastroenterology

## 2017-10-09 ENCOUNTER — Encounter: Payer: Self-pay | Admitting: Gastroenterology

## 2017-10-09 VITALS — BP 110/80 | HR 80 | Temp 97.8°F | Resp 12 | Ht 64.0 in | Wt 186.0 lb

## 2017-10-09 DIAGNOSIS — D122 Benign neoplasm of ascending colon: Secondary | ICD-10-CM | POA: Diagnosis not present

## 2017-10-09 DIAGNOSIS — R1013 Epigastric pain: Secondary | ICD-10-CM

## 2017-10-09 DIAGNOSIS — K5730 Diverticulosis of large intestine without perforation or abscess without bleeding: Secondary | ICD-10-CM

## 2017-10-09 DIAGNOSIS — Z86010 Personal history of colonic polyps: Secondary | ICD-10-CM

## 2017-10-09 MED ORDER — SODIUM CHLORIDE 0.9 % IV SOLN: 500.0000 mL | Freq: Once | INTRAVENOUS | Status: DC

## 2017-10-09 NOTE — Op Note (Signed)
[facility] Patient Name: Leah [name] Procedure Date: 10/09/2017 2:31 PM MRN: [id number] Endoscopist: Milus Banister , MD Age: 67 Referring MD:  Date of Birth: September 07, 1950 Gender: Female Account #: [id number] Procedure:                Upper GI endoscopy Indications:              Epigastric abdominal pain Medicines:                Monitored Anesthesia Care Procedure:                Pre-Anesthesia Assessment:                           - Prior to the procedure, a History and Physical                            was performed, and patient medications and                            allergies were reviewed. The patient's tolerance of                            previous anesthesia was also reviewed. The risks                            and benefits of the procedure and the sedation                            options and risks were discussed with the patient.                            All questions were answered, and informed consent                            was obtained. Prior Anticoagulants: The patient has                            taken no previous anticoagulant or antiplatelet                            agents. ASA Grade Assessment: II - A patient with                            mild systemic disease. After reviewing the risks                            and benefits, the patient was deemed in                            satisfactory condition to undergo the procedure.                           After obtaining informed consent, the endoscope was  passed under direct vision. Throughout the                            procedure, the patient's blood pressure, pulse, and                            oxygen saturations were monitored continuously. The                            Endoscope was introduced through the mouth, and                            advanced to the second part of duodenum. The upper                            GI endoscopy was  accomplished without difficulty.                            The patient tolerated the procedure well. Scope In: Scope Out: Findings:                 The esophagus was normal.                           Typical appearing post fundoplication anatomy. The                            stomach was otherwise normal.                           The examined duodenum was normal. Complications:            No immediate complications. Estimated blood loss:                            None. Estimated Blood Loss:     Estimated blood loss: none. Impression:               - Typical appearing post fundoplication anatomy.                            The UGI tract was otherwise normal. Recommendation:           - Patient has a contact number available for                            emergencies. The signs and symptoms of potential                            delayed complications were discussed with the                            patient. Return to normal activities tomorrow.                            Written discharge instructions were provided to the  patient.                           - Resume previous diet.                           - Continue present medications. Once nightly                            ranitidine seems to be really helping and so you                            should continue it. Your pains are likely acid                            (GERD) related. Milus Banister, MD 10/09/2017 3:00:20 PM This report has been signed electronically.

## 2017-10-09 NOTE — Progress Notes (Signed)
Pt's states no medical or surgical changes since previsit or office visit. 

## 2017-10-09 NOTE — Patient Instructions (Signed)
YOU HAD AN ENDOSCOPIC PROCEDURE TODAY AT THE [facility]:   Refer to the procedure report that was given to you for any specific questions about what was found during the examination.  If the procedure report does not answer your questions, please call your gastroenterologist to clarify.  If you requested that your care partner not be given the details of your procedure findings, then the procedure report has been included in a sealed envelope for you to review at your convenience later.  YOU SHOULD EXPECT: Some feelings of bloating in the abdomen. Passage of more gas than usual.  Walking can help get rid of the air that was put into your GI tract during the procedure and reduce the bloating. If you had a lower endoscopy (such as a colonoscopy or flexible sigmoidoscopy) you may notice spotting of blood in your stool or on the toilet paper. If you underwent a bowel prep for your procedure, you may not have a normal bowel movement for a few days.  Please Note:  You might notice some irritation and congestion in your nose or some drainage.  This is from the oxygen used during your procedure.  There is no need for concern and it should clear up in a day or so.  SYMPTOMS TO REPORT IMMEDIATELY:   Following lower endoscopy (colonoscopy or flexible sigmoidoscopy):  Excessive amounts of blood in the stool  Significant tenderness or worsening of abdominal pains  Swelling of the abdomen that is new, acute  Fever of 100F or higher   Following upper endoscopy (EGD)  Vomiting of blood or coffee ground material  New chest pain or pain under the shoulder blades  Painful or persistently difficult swallowing  New shortness of breath  Fever of 100F or higher  Black, tarry-looking stools  For urgent or emergent issues, a gastroenterologist can be reached at any hour by calling [phone].   DIET:  We do recommend a small meal at first, but then you may proceed to your regular diet.  Drink  plenty of fluids but you should avoid alcoholic beverages for 24 hours.  ACTIVITY:  You should plan to take it easy for the rest of today and you should NOT DRIVE or use heavy machinery until tomorrow (because of the sedation medicines used during the test).    FOLLOW UP: Our staff will call the number listed on your records the next business day following your procedure to check on you and address any questions or concerns that you may have regarding the information given to you following your procedure. If we do not reach you, we will leave a message.  However, if you are feeling well and you are not experiencing any problems, there is no need to return our call.  We will assume that you have returned to your regular daily activities without incident.  If any biopsies were taken you will be contacted by phone or by letter within the next 1-3 weeks.  Please call us at [phone] if you have not heard about the biopsies in 3 weeks.    SIGNATURES/CONFIDENTIALITY: You and/or your care partner have signed paperwork which will be entered into your electronic medical record.  These signatures attest to the fact that that the information above on your After Visit Summary has been reviewed and is understood.  Full responsibility of the confidentiality of this discharge information lies with you and/or your care-partner.  Read all of the handouts given to you by your recovery   room nurse. 

## 2017-10-09 NOTE — Progress Notes (Signed)
Spontaneous respirations throughout. VSS. Resting comfortably. To PACU on room air. Report to  RN. 

## 2017-10-09 NOTE — Progress Notes (Signed)
Called to room to assist during endoscopic procedure.  Patient ID and intended procedure confirmed with present staff. Received instructions for my participation in the procedure from the performing physician.  

## 2017-10-09 NOTE — Op Note (Signed)
[facility] Patient Name: Leah [name] Procedure Date: 10/09/2017 2:32 PM MRN: [id number] Endoscopist: Milus Banister , MD Age: 67 Referring MD:  Date of Birth: July 19, 1950 Gender: Female Account #: [id number] Procedure:                Colonoscopy Indications:              High risk colon cancer surveillance: Personal                            history of colonic polyps; Colonoscopy 11/2012, Dr.                            Gala Romney, done for history of adenomas. Findings                            left-sided diverticulosis. 2 diminutive polyps,                            normal terminal ileum. The polyps were adenomatous                            on pathology Medicines:                Monitored Anesthesia Care Procedure:                Pre-Anesthesia Assessment:                           - Prior to the procedure, a History and Physical                            was performed, and patient medications and                            allergies were reviewed. The patient's tolerance of                            previous anesthesia was also reviewed. The risks                            and benefits of the procedure and the sedation                            options and risks were discussed with the patient.                            All questions were answered, and informed consent                            was obtained. Prior Anticoagulants: The patient has                            taken no previous anticoagulant or antiplatelet  agents. ASA Grade Assessment: II - A patient with                            mild systemic disease. After reviewing the risks                            and benefits, the patient was deemed in                            satisfactory condition to undergo the procedure.                           After obtaining informed consent, the colonoscope                            was passed under direct vision. Throughout the                        procedure, the patient's blood pressure, pulse, and                            oxygen saturations were monitored continuously. The                            Colonoscope was introduced through the anus and                            advanced to the the cecum, identified by                            appendiceal orifice and ileocecal valve. The                            colonoscopy was performed without difficulty. The                            patient tolerated the procedure well. The quality                            of the bowel preparation was good. The ileocecal                            valve, appendiceal orifice, and rectum were                            photographed. Scope In: 2:38:00 PM Scope Out: 2:47:22 PM Scope Withdrawal Time: 0 hours 7 minutes 46 seconds  Total Procedure Duration: 0 hours 9 minutes 22 seconds  Findings:                 A 2 mm polyp was found in the ascending colon. The                            polyp was sessile. The polyp was removed with a  cold snare. Resection and retrieval were complete.                           Multiple small-mouthed diverticula were found in                            the left colon.                           The exam was otherwise without abnormality on                            direct and retroflexion views. Complications:            No immediate complications. Estimated blood loss:                            None. Estimated Blood Loss:     Estimated blood loss: none. Impression:               - One 2 mm polyp in the ascending colon, removed                            with a cold snare. Resected and retrieved.                           - Diverticulosis in the left colon.                           - The examination was otherwise normal on direct                            and retroflexion views. Recommendation:           - Patient has a contact number available for                             emergencies. The signs and symptoms of potential                            delayed complications were discussed with the                            patient. Return to normal activities tomorrow.                            Written discharge instructions were provided to the                            patient.                           - Resume previous diet.                           - Continue present medications.                           -  Repeat colonoscopy is recommended. The                            colonoscopy date will be determined after pathology                            results from today's exam become available for                            review.                           -                           You will receive a letter within 2-3 weeks with the                            pathology results and my final recommendations.                           If the polyp(s) is proven to be 'pre-cancerous' on                            pathology, you will need repeat colonoscopy in 5                            years. If the polyp(s) is NOT 'precancerous' on                            pathology then you should repeat colon cancer                            screening in 10 years with colonoscopy without need                            for colon cancer screening by any method prior to                            then (including stool testing). Milus Banister, MD 10/09/2017 2:57:31 PM This report has been signed electronically.

## 2017-10-10 ENCOUNTER — Telehealth: Payer: Self-pay

## 2017-10-10 ENCOUNTER — Telehealth: Payer: Self-pay | Admitting: *Deleted

## 2017-10-10 DIAGNOSIS — M7542 Impingement syndrome of left shoulder: Secondary | ICD-10-CM | POA: Diagnosis not present

## 2017-10-10 NOTE — Telephone Encounter (Signed)
First follow up call attempt.  Voicemail with phone number identified.  Message left to call if any questions or concerns.

## 2017-10-10 NOTE — Telephone Encounter (Signed)
Attempted to reach patient for post-procedure f/u call. This is the 2nd call. No answer. Left message for her to please call us if she has any questions/concerns regarding her care.

## 2017-10-12 DIAGNOSIS — M7542 Impingement syndrome of left shoulder: Secondary | ICD-10-CM | POA: Diagnosis not present

## 2017-10-15 ENCOUNTER — Encounter: Payer: Self-pay | Admitting: Gastroenterology

## 2017-10-15 DIAGNOSIS — M7542 Impingement syndrome of left shoulder: Secondary | ICD-10-CM | POA: Diagnosis not present

## 2017-10-22 DIAGNOSIS — M7542 Impingement syndrome of left shoulder: Secondary | ICD-10-CM | POA: Diagnosis not present

## 2017-10-24 DIAGNOSIS — Z23 Encounter for immunization: Secondary | ICD-10-CM | POA: Diagnosis not present

## 2017-10-24 DIAGNOSIS — M7542 Impingement syndrome of left shoulder: Secondary | ICD-10-CM | POA: Diagnosis not present

## 2017-10-26 ENCOUNTER — Encounter: Payer: Self-pay | Admitting: Internal Medicine

## 2017-11-14 DIAGNOSIS — M7542 Impingement syndrome of left shoulder: Secondary | ICD-10-CM | POA: Diagnosis not present

## 2017-12-25 ENCOUNTER — Ambulatory Visit: Payer: Medicare Other | Admitting: Allergy and Immunology

## 2018-01-29 ENCOUNTER — Other Ambulatory Visit: Payer: Self-pay | Admitting: *Deleted

## 2018-01-29 ENCOUNTER — Telehealth: Payer: Self-pay | Admitting: Family Medicine

## 2018-01-29 DIAGNOSIS — I10 Essential (primary) hypertension: Secondary | ICD-10-CM

## 2018-01-29 DIAGNOSIS — E7800 Pure hypercholesterolemia, unspecified: Secondary | ICD-10-CM

## 2018-01-29 NOTE — Telephone Encounter (Signed)
Pt is scheduled to see Dr Laurance Flatten 1/15 she is going to come in that morning and do her labs before her apt would like to have orders placed

## 2018-01-30 ENCOUNTER — Ambulatory Visit (INDEPENDENT_AMBULATORY_CARE_PROVIDER_SITE_OTHER): Payer: Medicare Other | Admitting: Family Medicine

## 2018-01-30 ENCOUNTER — Encounter: Payer: Self-pay | Admitting: Family Medicine

## 2018-01-30 VITALS — BP 122/68 | HR 82 | Temp 97.6°F | Ht 64.0 in | Wt 190.0 lb

## 2018-01-30 DIAGNOSIS — E7800 Pure hypercholesterolemia, unspecified: Secondary | ICD-10-CM | POA: Diagnosis not present

## 2018-01-30 DIAGNOSIS — R0683 Snoring: Secondary | ICD-10-CM

## 2018-01-30 DIAGNOSIS — E782 Mixed hyperlipidemia: Secondary | ICD-10-CM | POA: Diagnosis not present

## 2018-01-30 DIAGNOSIS — Z0000 Encounter for general adult medical examination without abnormal findings: Secondary | ICD-10-CM

## 2018-01-30 DIAGNOSIS — I10 Essential (primary) hypertension: Secondary | ICD-10-CM

## 2018-01-30 DIAGNOSIS — K2100 Gastro-esophageal reflux disease with esophagitis, without bleeding: Secondary | ICD-10-CM

## 2018-01-30 DIAGNOSIS — E559 Vitamin D deficiency, unspecified: Secondary | ICD-10-CM

## 2018-01-30 DIAGNOSIS — R05 Cough: Secondary | ICD-10-CM | POA: Diagnosis not present

## 2018-01-30 DIAGNOSIS — R059 Cough, unspecified: Secondary | ICD-10-CM

## 2018-01-30 MED ORDER — ALBUTEROL SULFATE (2.5 MG/3ML) 0.083% IN NEBU: 2.5000 mg | INHALATION_SOLUTION | Freq: Four times a day (QID) | RESPIRATORY_TRACT | 6 refills | Status: DC | PRN

## 2018-01-30 MED ORDER — METHYLPREDNISOLONE ACETATE 80 MG/ML IJ SUSP
60.0000 mg | Freq: Once | INTRAMUSCULAR | Status: AC
  Administered 2018-01-30: 60 mg via INTRAMUSCULAR

## 2018-01-30 MED ORDER — PREDNISONE 10 MG PO TABS: ORAL_TABLET | ORAL | 0 refills | Status: DC

## 2018-01-30 NOTE — Progress Notes (Signed)
Subjective:    Patient ID: Leah [name], female    DOB: 07/27/1950, 68 y.o.   MRN: [id number]  HPI Pt here for follow up and management of chronic medical problems which includes gerd, hypertension and hyperlipidemia. She is taking medication regularly.  Patient comes to the visit today for her regular 68-monthvisit.  She does complain of cough and congestion and may have a possible mold exposure and URI.  She has been snoring a lot.  She will get lab work today and will be given an FOBT to return.  She knows that she is due to get her mammogram done.  She has a albuterol nebulizer at home.  Her vital signs are stable and she is not running any fever.  The patient has a history of allergies anxiety arthritis and asthma.  She also complains with GERD hyperlipidemia and hypertension.  The family history is positive for heart disease diabetes and Alzheimer's.  The patient is currently using an albuterol inhaler and has a nebulizer at home.  She takes vitamin D 2000 units lisinopril HCT C 20-12 0.5 Claritin Singulair and omeprazole.  The patient is concerned that she may have problems with mold.  She has a moisture problem under her house.  She was away from the home for short period of time at the beach and felt much better and came back home and started coughing and getting more congested again.  She is using her albuterol inhaler fairly frequently and does have a nebulizer treatment with albuterol that she uses occasionally.  She has not been running any fever.  She has just the cough and congestion.  She also was told that she was snoring and not breathing at nighttime and she plans to follow-up on getting a sleep apnea evaluation.  She has seen Dr. EAllena Katzin the past for allergy and will call and arrange to see him for an evaluation.  She will also call people that do mold inspections and have her home check for mold.  Today she denies any chest pain.  She does have some shortness of breath  associated with the cough and has use the inhalers more frequently than usual.  She denies any trouble with swallowing heartburn indigestion nausea vomiting diarrhea blood in the stool or black tarry bowel movements and is passing her water well.    Patient Active Problem List   Diagnosis Date Noted  . Rectocele 07/13/2017  . Arthropathy of left shoulder 07/13/2017  . Hypokalemia 02/20/2017  . Chest pain 01/17/2017  . Adjustment disorder with mixed anxiety and depressed mood 03/25/2013  . Epigastric pain 11/12/2012  . Nausea alone 11/12/2012  . History of colonic polyps 11/12/2012  . Hypertension, benign essential, goal below 140/90 10/30/2012  . Hyperlipidemia 10/22/2012  . GERD (gastroesophageal reflux disease) 10/22/2012  . Asthma 10/22/2012   Outpatient Encounter Medications as of 01/30/2018  Medication Sig  . albuterol (PROVENTIL HFA;VENTOLIN HFA) 108 (90 Base) MCG/ACT inhaler Inhale 2 puffs into the lungs every 6 (six) hours as needed for wheezing or shortness of breath.  .Marland Kitchenalbuterol (PROVENTIL) (2.5 MG/3ML) 0.083% nebulizer solution Take 3 mLs (2.5 mg total) by nebulization every 6 (six) hours as needed for wheezing or shortness of breath.  .Marland Kitchenaspirin 81 MG tablet Take 81 mg by mouth daily.  . calcium carbonate (TUMS) 500 MG chewable tablet Chew 1 tablet (200 mg of elemental calcium total) by mouth as needed for indigestion or heartburn.  . Cholecalciferol (VITAMIN D)  2000 UNITS tablet Take 2,000 Units by mouth daily.  . fish oil-omega-3 fatty acids 1000 MG capsule Take 2 g by mouth daily.   Marland Kitchen lisinopril-hydrochlorothiazide (PRINZIDE,ZESTORETIC) 20-12.5 MG tablet Take 1 tablet by mouth daily.  Marland Kitchen loratadine (CLARITIN) 10 MG tablet Take 10 mg by mouth 2 (two) times daily.  . montelukast (SINGULAIR) 10 MG tablet TAKE ONE TABLET AT BEDTIME  . omeprazole (PRILOSEC) 40 MG capsule Take 1 capsule (40 mg total) by mouth every morning. (Patient taking differently: Take 40 mg by mouth daily.  )  . EPINEPHrine (EPIPEN 2-PAK) 0.3 mg/0.3 mL IJ SOAJ injection Inject into the muscle once.  . lidocaine (LIDODERM) 5 % Place 1 patch onto the skin daily. Remove & Discard patch within 12 hours or as directed by MD (Patient not taking: Reported on 09/27/2017)  . [DISCONTINUED] ranitidine (ZANTAC) 300 MG tablet Take 1 tablet (300 mg total) by mouth at bedtime.  . [DISCONTINUED] rosuvastatin (CRESTOR) 5 MG tablet Take 1 tablet by mouth 3 days a week as tolerated. (Patient not taking: Reported on 09/27/2017)   No facility-administered encounter medications on file as of 01/30/2018.      Review of Systems  Constitutional: Negative.   HENT: Positive for congestion.        Snoring - apnea   Eyes: Negative.   Respiratory: Positive for cough (gets better and comes back ).   Cardiovascular: Negative.   Gastrointestinal: Negative.   Endocrine: Negative.   Genitourinary: Negative.   Musculoskeletal: Negative.   Skin: Negative.   Allergic/Immunologic: Negative.   Neurological: Negative.   Hematological: Negative.   Psychiatric/Behavioral: Negative.        Objective:   Physical Exam Vitals signs and nursing note reviewed.  Constitutional:      Appearance: Normal appearance. She is well-developed. She is obese. She is not ill-appearing.     Comments: Patient is pleasant and smiling and appears to be feeling pretty well but she does look a little congested around her face and eyes.  HENT:     Head: Normocephalic and atraumatic.     Right Ear: Tympanic membrane, ear canal and external ear normal. There is no impacted cerumen.     Left Ear: Tympanic membrane, ear canal and external ear normal. There is no impacted cerumen.     Nose: Nose normal. No congestion or rhinorrhea.     Mouth/Throat:     Mouth: Mucous membranes are moist.     Pharynx: Oropharynx is clear. No oropharyngeal exudate or posterior oropharyngeal erythema.  Eyes:     General: No scleral icterus.       Right eye: No  discharge.        Left eye: No discharge.     Extraocular Movements: Extraocular movements intact.     Conjunctiva/sclera: Conjunctivae normal.     Pupils: Pupils are equal, round, and reactive to light.  Neck:     Musculoskeletal: Normal range of motion and neck supple.     Thyroid: No thyromegaly.     Vascular: No JVD.  Cardiovascular:     Rate and Rhythm: Normal rate and regular rhythm.     Heart sounds: Normal heart sounds. No murmur.     Comments: The heart is regular at 72/min with no edema and good pedal pulses. Pulmonary:     Effort: Pulmonary effort is normal. No respiratory distress.     Breath sounds: Normal breath sounds. No wheezing, rhonchi or rales.     Comments: Dry cough and  lungs are clear anteriorly and posteriorly.  No wheezes or rhonchi. Abdominal:     General: Abdomen is flat. Bowel sounds are normal.     Palpations: Abdomen is soft. There is no mass.     Tenderness: There is no abdominal tenderness.     Comments: No masses tenderness organ enlargement or bruits  Musculoskeletal: Normal range of motion.        General: No tenderness.     Right lower leg: No edema.     Left lower leg: No edema.  Lymphadenopathy:     Cervical: No cervical adenopathy.  Skin:    General: Skin is warm and dry.     Findings: No rash.  Neurological:     General: No focal deficit present.     Mental Status: She is alert and oriented to person, place, and time.     Cranial Nerves: No cranial nerve deficit.     Deep Tendon Reflexes: Reflexes are normal and symmetric. Reflexes normal.  Psychiatric:        Mood and Affect: Mood normal.        Behavior: Behavior normal.        Thought Content: Thought content normal.        Judgment: Judgment normal.     Comments: Mood affect and behavior are normal for this patient.     BP 122/68 (BP Location: Left Arm)   Pulse 82   Temp 97.6 F (36.4 C) (Oral)   Ht '5\' 4"'$  (1.626 m)   Wt 190 lb (86.2 kg)   SpO2 100%   BMI 32.61 kg/m         Assessment & Plan:  1. Pure hypercholesterolemia -Continue aggressive therapeutic lifestyle changes pending results of lab work - CBC with Differential/Platelet - Lipid panel - BMP8+EGFR  2. Essential hypertension -Blood pressure is good today and she will continue with current treatment - CBC with Differential/Platelet - Lipid panel - BMP8+EGFR  3. Mixed hyperlipidemia - Hepatic function panel - Lipid Profile  4. Vitamin D deficiency -Continue with vitamin D replacement pending results of lab work - VITAMIN D 25 Hydroxy (Vit-D Deficiency, Fractures)  5. Gastroesophageal reflux disease with esophagitis -Patient is currently taking ranitidine and will make sure that she discontinues this and switches to Pepcid AC.  6. Annual physical exam - albuterol (PROVENTIL) (2.5 MG/3ML) 0.083% nebulizer solution; Take 3 mLs (2.5 mg total) by nebulization every 6 (six) hours as needed for wheezing or shortness of breath.  Dispense: 150 mL; Refill: 6  7. Cough -Use and ask for cough and congestion and use nasal saline and use inhaler regularly. - methylPREDNISolone acetate (DEPO-MEDROL) injection 60 mg  8. Snoring -For for sleep apnea evaluation  Meds ordered this encounter  Medications  . albuterol (PROVENTIL) (2.5 MG/3ML) 0.083% nebulizer solution    Sig: Take 3 mLs (2.5 mg total) by nebulization every 6 (six) hours as needed for wheezing or shortness of breath.    Dispense:  150 mL    Refill:  6    J45.909  . predniSONE (DELTASONE) 10 MG tablet    Sig: Take 1 QID x 2 days, then 1 tab TID x 2 days, then 1 tab BID x 2 days, then 1 tab QD x 2 days    Dispense:  20 tablet    Refill:  0  . methylPREDNISolone acetate (DEPO-MEDROL) injection 60 mg   Patient Instructions  Medicare Annual Wellness Visit  [facility] and the medical providers at [facility] strive to bring you the best medical care.  In doing so we not only want to  address your current medical conditions and concerns but also to detect new conditions early and prevent illness, disease and health-related problems.    Medicare offers a yearly Wellness Visit which allows our clinical staff to assess your need for preventative services including immunizations, lifestyle education, counseling to decrease risk of preventable diseases and screening for fall risk and other medical concerns.    This visit is provided free of charge (no copay) for all Medicare recipients. The clinical pharmacists at [facility] have begun to conduct these Wellness Visits which will also include a thorough review of all your medications.    As you primary medical provider recommend that you make an appointment for your Annual Wellness Visit if you have not done so already this year.  You may set up this appointment before you leave today or you may call back ([phone]) and schedule an appointment.  Please make sure when you call that you mention that you are scheduling your Annual Wellness Visit with the clinical pharmacist so that the appointment may be made for the proper length of time.     Continue current medications. Continue good therapeutic lifestyle changes which include good diet and exercise. Fall precautions discussed with patient. If an FOBT was given today- please return it to our front desk. If you are over 58 years old - you may need Prevnar 65 or the adult Pneumonia vaccine.  **Flu shots are available--- please call and schedule a FLU-CLINIC appointment**  After your visit with [place] today you will receive a survey in the mail or online from [employer] regarding your care with [place]. Please take a moment to fill this out. Your feedback is very important to [place] as you can help [place] better understand your patient needs as well as improve your experience and satisfaction. WE CARE ABOUT YOU!!!   Take prednisone as directed Use Mucinex otc Seek a mold  specialist to check your house.   Arrie Senate MD

## 2018-01-30 NOTE — Patient Instructions (Addendum)
Medicare Annual Wellness Visit  [facility] and the medical providers at [facility] strive to bring you the best medical care.  In doing so we not only want to address your current medical conditions and concerns but also to detect new conditions early and prevent illness, disease and health-related problems.    Medicare offers a yearly Wellness Visit which allows our clinical staff to assess your need for preventative services including immunizations, lifestyle education, counseling to decrease risk of preventable diseases and screening for fall risk and other medical concerns.    This visit is provided free of charge (no copay) for all Medicare recipients. The clinical pharmacists at [facility] have begun to conduct these Wellness Visits which will also include a thorough review of all your medications.    As you primary medical provider recommend that you make an appointment for your Annual Wellness Visit if you have not done so already this year.  You may set up this appointment before you leave today or you may call back ([phone]) and schedule an appointment.  Please make sure when you call that you mention that you are scheduling your Annual Wellness Visit with the clinical pharmacist so that the appointment may be made for the proper length of time.     Continue current medications. Continue good therapeutic lifestyle changes which include good diet and exercise. Fall precautions discussed with patient. If an FOBT was given today- please return it to our front desk. If you are over 61 years old - you may need Prevnar 44 or the adult Pneumonia vaccine.  **Flu shots are available--- please call and schedule a FLU-CLINIC appointment**  After your visit with [place] today you will receive a survey in the mail or online from [employer] regarding your care with [place]. Please take a moment to fill this out. Your feedback is very  important to [place] as you can help [place] better understand your patient needs as well as improve your experience and satisfaction. WE CARE ABOUT YOU!!!   Take prednisone as directed Use Mucinex otc Seek a mold specialist to check your house.

## 2018-01-31 LAB — BMP8+EGFR
BUN/Creatinine Ratio: 18 (ref 12–28)
BUN: 17 mg/dL (ref 8–27)
CO2: 27 mmol/L (ref 20–29)
Calcium: 9 mg/dL (ref 8.7–10.3)
Chloride: 101 mmol/L (ref 96–106)
Creatinine, Ser: 0.92 mg/dL (ref 0.57–1.00)
GFR calc Af Amer: 75 mL/min/{1.73_m2} (ref >59)
GFR calc non Af Amer: 65 mL/min/{1.73_m2} (ref >59)
Glucose: 82 mg/dL (ref 65–99)
Potassium: 4 mmol/L (ref 3.5–5.2)
Sodium: 140 mmol/L (ref 134–144)

## 2018-01-31 LAB — CBC WITH DIFFERENTIAL/PLATELET
Basophils Absolute: 0 10*3/uL (ref 0.0–0.2)
Basos: 1 %
EOS (ABSOLUTE): 0.2 10*3/uL (ref 0.0–0.4)
Eos: 6 %
Hematocrit: 34.7 % (ref 34.0–46.6)
Hemoglobin: 11.8 g/dL (ref 11.1–15.9)
Immature Grans (Abs): 0 10*3/uL (ref 0.0–0.1)
Immature Granulocytes: 0 %
Lymphocytes Absolute: 1.1 10*3/uL (ref 0.7–3.1)
Lymphs: 39 %
MCH: 28 pg (ref 26.6–33.0)
MCHC: 34 g/dL (ref 31.5–35.7)
MCV: 82 fL (ref 79–97)
Monocytes Absolute: 0.3 10*3/uL (ref 0.1–0.9)
Monocytes: 10 %
Neutrophils Absolute: 1.2 10*3/uL — ABNORMAL LOW (ref 1.4–7.0)
Neutrophils: 44 %
Platelets: 288 10*3/uL (ref 150–450)
RBC: 4.21 x10E6/uL (ref 3.77–5.28)
RDW: 13.1 % (ref 11.7–15.4)
WBC: 2.7 10*3/uL — ABNORMAL LOW (ref 3.4–10.8)

## 2018-01-31 LAB — LIPID PANEL
Chol/HDL Ratio: 5.1 ratio — ABNORMAL HIGH (ref 0.0–4.4)
Cholesterol, Total: 261 mg/dL — ABNORMAL HIGH (ref 100–199)
HDL: 51 mg/dL (ref >39)
LDL Calculated: 190 mg/dL — ABNORMAL HIGH (ref 0–99)
Triglycerides: 100 mg/dL (ref 0–149)
VLDL Cholesterol Cal: 20 mg/dL (ref 5–40)

## 2018-01-31 LAB — HEPATIC FUNCTION PANEL
ALT: 17 IU/L (ref 0–32)
AST: 17 IU/L (ref 0–40)
Albumin: 3.7 g/dL (ref 3.6–4.8)
Alkaline Phosphatase: 79 IU/L (ref 39–117)
Bilirubin Total: 0.2 mg/dL (ref 0.0–1.2)
Bilirubin, Direct: 0.06 mg/dL (ref 0.00–0.40)
Total Protein: 5.8 g/dL — ABNORMAL LOW (ref 6.0–8.5)

## 2018-02-02 LAB — VITAMIN D 25 HYDROXY (VIT D DEFICIENCY, FRACTURES): Vit D, 25-Hydroxy: 34.2 ng/mL (ref 30.0–100.0)

## 2018-02-05 ENCOUNTER — Telehealth: Payer: Self-pay | Admitting: Family Medicine

## 2018-02-05 NOTE — Telephone Encounter (Signed)
Pt aware.

## 2018-02-19 ENCOUNTER — Other Ambulatory Visit: Payer: Self-pay | Admitting: Family Medicine

## 2018-02-19 DIAGNOSIS — Z0000 Encounter for general adult medical examination without abnormal findings: Secondary | ICD-10-CM

## 2018-02-25 ENCOUNTER — Other Ambulatory Visit: Payer: Medicare Other

## 2018-02-25 DIAGNOSIS — Z1211 Encounter for screening for malignant neoplasm of colon: Secondary | ICD-10-CM

## 2018-02-26 LAB — FECAL OCCULT BLOOD, IMMUNOCHEMICAL: Fecal Occult Bld: NEGATIVE

## 2018-03-05 ENCOUNTER — Encounter: Payer: Self-pay | Admitting: Allergy and Immunology

## 2018-03-05 ENCOUNTER — Ambulatory Visit (INDEPENDENT_AMBULATORY_CARE_PROVIDER_SITE_OTHER): Payer: Medicare Other | Admitting: Allergy and Immunology

## 2018-03-05 VITALS — BP 122/86 | HR 96 | Resp 16 | Ht 64.0 in | Wt 188.0 lb

## 2018-03-05 DIAGNOSIS — T7800XD Anaphylactic reaction due to unspecified food, subsequent encounter: Secondary | ICD-10-CM

## 2018-03-05 DIAGNOSIS — K219 Gastro-esophageal reflux disease without esophagitis: Secondary | ICD-10-CM | POA: Diagnosis not present

## 2018-03-05 DIAGNOSIS — Z79899 Other long term (current) drug therapy: Secondary | ICD-10-CM | POA: Diagnosis not present

## 2018-03-05 DIAGNOSIS — J3089 Other allergic rhinitis: Secondary | ICD-10-CM

## 2018-03-05 DIAGNOSIS — L500 Allergic urticaria: Secondary | ICD-10-CM | POA: Diagnosis not present

## 2018-03-05 DIAGNOSIS — J4530 Mild persistent asthma, uncomplicated: Secondary | ICD-10-CM

## 2018-03-05 MED ORDER — LOSARTAN POTASSIUM-HCTZ 100-12.5 MG PO TABS: 1.0000 | ORAL_TABLET | Freq: Every day | ORAL | 1 refills | Status: DC

## 2018-03-05 MED ORDER — FAMOTIDINE 40 MG PO TABS: 40.0000 mg | ORAL_TABLET | Freq: Every day | ORAL | 5 refills | Status: DC

## 2018-03-05 MED ORDER — BUDESONIDE 180 MCG/ACT IN AEPB: 2.0000 | INHALATION_SPRAY | Freq: Every day | RESPIRATORY_TRACT | 5 refills | Status: DC

## 2018-03-05 NOTE — Progress Notes (Signed)
Follow-up Note  Referring Provider: Chipper Herb, MD Primary Provider: Chipper Herb, MD Date of Office Visit: 03/05/2018  Subjective:   Leah [name] (DOB: 1950-03-06) is a 68 y.o. female who returns to the [facility] [place] on 03/05/2018 in re-evaluation of the following:  HPI: Leah [name] returns to this clinic in reevaluation of her alpha gal syndrome, intermittent urticaria, LPR, and asthma.  I last saw her in this clinic 26 June 2017.  She has had some difficulty with her respiratory tract.  She has noticed that she has developed a chronic cough.  This is not a particularly bad cough in that it does not interfere with her ability to sleep or interfere with her ability to exercise but she just notices a slight cough that has been present on a very common basis.  As well, she has developed 2 very significant episodes of coughing 1 associated with a fever around Christmas time requiring the administration of systemic steroids and 1 occurring about 2 weeks ago unassociated with a fever for which she once again received systemic steroids.  She is much better regarding these issues but she still has the sensation that there is some irritation in her airway especially the center of her chest up to her throat.  She can now sleep without having any problems with cough.  She never had any sputum production or chest pain or other issues.  She has been using albuterol nebulization and MDI which may help her somewhat.  When I last saw her in this clinic she had the diagnosis of alpha gal syndrome and she has been avoiding all mammal.  She avoids dairy as well as she thinks that this may give rise to urticaria but it should be noted that she can occasionally eat ice cream without a problem.  As long she remains away from mammal she really has no significant issues.  She did have an upper endoscopy and a colonoscopy in investigation of her continued GI issues that appeared to occur in the  face of aggressive therapy directed against reflux.  She is now using a combination of omeprazole and famotidine and occasionally takes some Mylanta.  She does have systemic arterial hypertension and is using an ACE inhibitor.  She did obtain a flu vaccine this year.  Leah [name] does mention that she has noticed that there was some moisture in the house and she has had contractors come over to look at encapsulating her crawlspace.  Allergies as of 03/05/2018      Reactions   Codeine Nausea And Vomiting   Meat [alpha-gal]    Penicillins Other (See Comments)   Does not know   Praluent [alirocumab]    Rash and myalgias   Strawberry (diagnostic)       Medication List      albuterol (2.5 MG/3ML) 0.083% nebulizer solution Commonly known as:  PROVENTIL Take 3 mLs (2.5 mg total) by nebulization every 6 (six) hours as needed for wheezing or shortness of breath.   PROAIR HFA 108 (90 Base) MCG/ACT inhaler Generic drug:  albuterol 2 PUFFS EVERY 6 HOURS AS NEEDED FOR WHEEZING OR SHORTNESS OF BREATH   aspirin 81 MG tablet Take 81 mg by mouth daily.   calcium carbonate 500 MG chewable tablet Commonly known as:  TUMS Chew 1 tablet (200 mg of elemental calcium total) by mouth as needed for indigestion or heartburn.   EPIPEN 2-PAK 0.3 mg/0.3 mL Soaj injection Generic drug:  EPINEPHrine Inject  into the muscle once.   fish oil-omega-3 fatty acids 1000 MG capsule Take 2 g by mouth daily.   lidocaine 5 % Commonly known as:  LIDODERM Place 1 patch onto the skin daily. Remove & Discard patch within 12 hours or as directed by MD   lisinopril-hydrochlorothiazide 20-12.5 MG tablet Commonly known as:  PRINZIDE,ZESTORETIC Take 1 tablet by mouth daily.   loratadine 10 MG tablet Commonly known as:  CLARITIN Take 10 mg by mouth 2 (two) times daily.   montelukast 10 MG tablet Commonly known as:  SINGULAIR TAKE ONE TABLET AT BEDTIME   omeprazole 40 MG capsule Commonly known as:  PRILOSEC Take 1  capsule (40 mg total) by mouth every morning.   Vitamin D 50 MCG (2000 UT) tablet Take 2,000 Units by mouth daily.       Past Medical History:  Diagnosis Date  . Allergy   . Allergy to alpha-gal   . Anxiety   . Arthritis   . Asthma   . CAD (coronary artery disease)   . Colon polyp   . Depression   . Food allergy   . GERD (gastroesophageal reflux disease)   . Hyperlipidemia   . Hypertension   . Urticaria     Past Surgical History:  Procedure Laterality Date  . ABDOMINAL HYSTERECTOMY    . Carpel Tunnel    . CHOLECYSTECTOMY    . COLONOSCOPY  02/08/2005   YWV:PXTGGYIR hemorrhoids otherwise normal rectum/ A few scattered, shallow, left-sided sigmoid diverticula/The remainder of the colonic mucosa appeared normal  . COLONOSCOPY WITH ESOPHAGOGASTRODUODENOSCOPY (EGD) N/A 11/29/2012   Procedure: COLONOSCOPY WITH ESOPHAGOGASTRODUODENOSCOPY (EGD);  Surgeon: Daneil Dolin, MD;  Location: [facility];  Service: Endoscopy;  Laterality: N/A;  10:45  . CYST REMOVAL TRUNK    . HERNIA REPAIR     ventral X 2 with mesh, last one 2011 ([place]). complicated with 2 week hospital stay  . HERNIA REPAIR     with mesh  . NISSEN FUNDOPLICATION  4854  . POLYPECTOMY    . TUBAL LIGATION      Review of systems negative except as noted in HPI / PMHx or noted below:  Review of Systems  Constitutional: Negative.   HENT: Negative.   Eyes: Negative.   Respiratory: Negative.   Cardiovascular: Negative.   Gastrointestinal: Negative.   Genitourinary: Negative.   Musculoskeletal: Negative.   Skin: Negative.   Neurological: Negative.   Endo/Heme/Allergies: Negative.   Psychiatric/Behavioral: Negative.      Objective:   Vitals:   03/05/18 1801  BP: 122/86  Pulse: 96  Resp: 16  SpO2: 97%   Height: 5\' 4"  (162.6 cm)  Weight: 188 lb (85.3 kg)   Physical Exam Constitutional:      Appearance: She is not diaphoretic.  HENT:     Head: Normocephalic.     Right Ear: Tympanic  membrane, ear canal and external ear normal.     Left Ear: Tympanic membrane, ear canal and external ear normal.     Nose: Nose normal. No mucosal edema or rhinorrhea.     Mouth/Throat:     Pharynx: Uvula midline. No oropharyngeal exudate.  Eyes:     Conjunctiva/sclera: Conjunctivae normal.  Neck:     Thyroid: No thyromegaly.     Trachea: Trachea normal. No tracheal tenderness or tracheal deviation.  Cardiovascular:     Rate and Rhythm: Normal rate and regular rhythm.     Heart sounds: Normal heart sounds, S1 normal and S2 normal.  No murmur.  Pulmonary:     Effort: No respiratory distress.     Breath sounds: Normal breath sounds. No stridor. No wheezing or rales.  Lymphadenopathy:     Head:     Right side of head: No tonsillar adenopathy.     Left side of head: No tonsillar adenopathy.     Cervical: No cervical adenopathy.  Skin:    Findings: No erythema or rash.     Nails: There is no clubbing.   Neurological:     Mental Status: She is alert.     Diagnostics:    Spirometry was performed and demonstrated an FEV1 of 2.0 at 101 % of predicted.  The patient had an Asthma Control Test with the following results: ACT Total Score: 12.    Assessment and Plan:   1. Not well controlled mild persistent asthma   2. Other allergic rhinitis   3. LPRD (laryngopharyngeal reflux disease)   4. Anaphylactic shock due to food, subsequent encounter   5. Allergic urticaria   6. On angiotensin-converting enzyme (ACE) inhibitors     1. Allergen avoidance measures against mammal consumption  2. Every day use the following preventative medications:   A. loratadine 10 mg tablet twice a day  B. montelukast 10 mg tablet in evening  C. Pulmicort 180 - 2 inhalations 1 time per day  D. OTC Nasacort - 1 spray each nostril 3-7 times a week  3. Continue to treat reflux:   A. Omeprazole 40 mg tablet in AM  B. Famotidine 40 mg in PM  C. Mylanta / similar if needed  4. If needed:   A.  albuterol MDI 2 inhalations or albuterol nebulization every 4-6 hours  B. EpiPen, Benadryl, M.D./ER evaluation for allergic reaction  C. OTC antihistamine  D. OTC Mucinex DM  E. OTC nasal saline  5. Replace Lisinopril-hydrochlorothiazide with Losartan 100 / HCTZ 12.5 one time per day. Check blood pressure  6. Return to clinic in 3 months or earlier if problem  I am going to start Leah [name] on a low dose of inhaled steroids as a preventative for respiratory tract inflammation involving her lungs.  As well, she has a chronic cough and she is on lisinopril and she will eliminate this irritant to her airway and have her use losartan in place of her ACE inhibitor and check her blood pressure and she can follow-up with her primary care doctor regarding further management of this issue.  She will continue to utilize other anti-inflammatory agents for airway and therapy directed against reflux as noted above and continue to avoid all mammal consumption at this point.  I will see her back in this clinic in 3 months to assess her response to this approach.  Further evaluation treatment based upon her response.  Allena Katz, MD [facility] / [facility]

## 2018-03-05 NOTE — Patient Instructions (Addendum)
  1. Allergen avoidance measures against mammal consumption  2. Every day use the following preventative medications:   A. loratadine 10 mg tablet twice a day  B. montelukast 10 mg tablet in evening  C. Pulmicort 180 - 2 inhalations 1 time per day  D. OTC Nasacort - 1 spray each nostril 3-7 times a week  3. Continue to treat reflux:   A. Omeprazole 40 mg tablet in AM  B. Famotidine 40 mg in PM  C. Mylanta / similar if needed  4. If needed:   A. albuterol MDI 2 inhalations or albuterol nebulization every 4-6 hours  B. EpiPen, Benadryl, M.D./ER evaluation for allergic reaction  C. OTC antihistamine  D. OTC Mucinex DM  E. OTC nasal saline  5. Replace Lisinopril-hydrochlorothiazide with Losartan 100 / HCTZ 12.5 one time per day. Check blood pressure  6. Return to clinic in 3 months or earlier if problem

## 2018-03-06 ENCOUNTER — Encounter: Payer: Self-pay | Admitting: Allergy and Immunology

## 2018-03-07 ENCOUNTER — Telehealth: Payer: Self-pay | Admitting: *Deleted

## 2018-03-07 MED ORDER — FLUTICASONE FUROATE 100 MCG/ACT IN AEPB: 1.0000 | INHALATION_SPRAY | Freq: Every day | RESPIRATORY_TRACT | 5 refills | Status: DC

## 2018-03-07 NOTE — Telephone Encounter (Signed)
Arnuity 100 was sent to patient pharmacy listed in chart.

## 2018-03-07 NOTE — Telephone Encounter (Signed)
Pulmicort Flexhaler 180 is not covered insurance prefers [employer]

## 2018-03-07 NOTE — Telephone Encounter (Signed)
Arnuity 100 - 1 inhalation one time per day

## 2018-04-30 ENCOUNTER — Other Ambulatory Visit: Payer: Self-pay | Admitting: Allergy & Immunology

## 2018-04-30 NOTE — Telephone Encounter (Signed)
Absolutely not. I would have her talk to her PCP.   Salvatore Marvel, MD [facility]

## 2018-05-02 ENCOUNTER — Other Ambulatory Visit: Payer: Self-pay | Admitting: *Deleted

## 2018-05-02 MED ORDER — LOSARTAN POTASSIUM-HCTZ 100-12.5 MG PO TABS: 1.0000 | ORAL_TABLET | Freq: Every day | ORAL | 2 refills | Status: DC

## 2018-05-20 ENCOUNTER — Other Ambulatory Visit: Payer: Self-pay

## 2018-05-20 ENCOUNTER — Encounter: Payer: Self-pay | Admitting: Family Medicine

## 2018-05-20 ENCOUNTER — Ambulatory Visit (INDEPENDENT_AMBULATORY_CARE_PROVIDER_SITE_OTHER): Payer: Medicare Other | Admitting: Family Medicine

## 2018-05-20 DIAGNOSIS — J4521 Mild intermittent asthma with (acute) exacerbation: Secondary | ICD-10-CM

## 2018-05-20 MED ORDER — LEVOFLOXACIN 500 MG PO TABS: 500.0000 mg | ORAL_TABLET | Freq: Every day | ORAL | 0 refills | Status: DC

## 2018-05-20 MED ORDER — PREDNISONE 10 MG PO TABS: ORAL_TABLET | ORAL | 0 refills | Status: DC

## 2018-05-20 NOTE — Progress Notes (Signed)
Subjective:    Patient ID: Leah [name], female    DOB: 1950/12/24, 68 y.o.   MRN: [id number]   HPI: [name] is a 68 y.o. female presenting for tight chest increasing since yesterday. Bringing up plegm. No sleep last night. Increasing cough. Feels like asthma attack coming on. Dull HA. Moderate ST. Pulse is up to 102. Feels warm. Doesn't have a thermometer. Gets cold then hot.   Depression screen [facility] 2/9 08/08/2017 07/11/2017 06/01/2017 04/05/2017 03/23/2017  Decreased Interest 0 0 0 0 0  Down, Depressed, Hopeless 0 0 0 0 0  PHQ - 2 Score 0 0 0 0 0  Altered sleeping - - - - -  Tired, decreased energy - - - - -  Change in appetite - - - - -  Feeling bad or failure about yourself  - - - - -  Trouble concentrating - - - - -  Moving slowly or fidgety/restless - - - - -  Suicidal thoughts - - - - -  PHQ-9 Score - - - - -     Relevant past medical, surgical, family and social history reviewed and updated as indicated.  Interim medical history since our last visit reviewed. Allergies and medications reviewed and updated.  ROS:  Review of Systems   Social History   Tobacco Use  Smoking Status Former Smoker  . Packs/day: 0.50  . Years: 28.00  . Pack years: 14.00  . Types: Cigarettes  . Last attempt to quit: 01/17/1996  . Years since quitting: 22.3  Smokeless Tobacco Never Used       Objective:     Wt Readings from Last 3 Encounters:  03/05/18 188 lb (85.3 kg)  01/30/18 190 lb (86.2 kg)  10/09/17 186 lb (84.4 kg)     Exam deferred. Pt. Harboring due to COVID 19. Phone visit performed.   Assessment & Plan:   1. Mild intermittent asthma with acute exacerbation     Meds ordered this encounter  Medications  . predniSONE (DELTASONE) 10 MG tablet    Sig: Take 1 QID x 2 days, then 1 tab TID x 2 days, then 1 tab BID x 2 days, then 1 tab QD x 2 days    Dispense:  20 tablet    Refill:  0  . levofloxacin (LEVAQUIN) 500 MG tablet    Sig: Take 1 tablet (500 mg  total) by mouth daily.    Dispense:  7 tablet    Refill:  0    No orders of the defined types were placed in this encounter.     Diagnoses and all orders for this visit:  Mild intermittent asthma with acute exacerbation  Other orders -     predniSONE (DELTASONE) 10 MG tablet; Take 1 QID x 2 days, then 1 tab TID x 2 days, then 1 tab BID x 2 days, then 1 tab QD x 2 days -     levofloxacin (LEVAQUIN) 500 MG tablet; Take 1 tablet (500 mg total) by mouth daily.    Virtual Visit via telephone Note  I discussed the limitations, risks, security and privacy concerns of performing an evaluation and management service by telephone and the availability of in person appointments. The patient was identified with two identifiers. Pt.expressed understanding and agreed to proceed. Pt. Is at home. Dr. Livia Snellen is in his office.  Follow Up Instructions:   I discussed the assessment and treatment plan with the patient. The patient was provided an  opportunity to ask questions and all were answered. The patient agreed with the plan and demonstrated an understanding of the instructions.   The patient was advised to call back or seek an in-person evaluation if the symptoms worsen or if the condition fails to improve as anticipated.  Visit started: 2:15 Call ended:  2:25 Total minutes including chart review and phone contact time: 13   Follow up plan: Return if symptoms worsen or fail to improve.  Claretta Fraise, MD [place]

## 2018-05-30 ENCOUNTER — Ambulatory Visit (INDEPENDENT_AMBULATORY_CARE_PROVIDER_SITE_OTHER): Payer: Medicare Other | Admitting: Family

## 2018-05-30 ENCOUNTER — Other Ambulatory Visit: Payer: Self-pay

## 2018-05-30 ENCOUNTER — Encounter: Payer: Self-pay | Admitting: Family

## 2018-05-30 DIAGNOSIS — M7662 Achilles tendinitis, left leg: Secondary | ICD-10-CM

## 2018-05-30 DIAGNOSIS — M7661 Achilles tendinitis, right leg: Secondary | ICD-10-CM | POA: Diagnosis not present

## 2018-05-30 MED ORDER — DICLOFENAC SODIUM 75 MG PO TBEC: 75.0000 mg | DELAYED_RELEASE_TABLET | Freq: Two times a day (BID) | ORAL | 0 refills | Status: DC

## 2018-05-30 NOTE — Progress Notes (Signed)
   Virtual Visit via telephone Note  I connected with Leah [name] on 05/30/18 at 12:21 pm by telephone and verified that I am speaking with the correct person using two identifiers. Leah [name] is currently located at home and no one is currently with her during visit. The provider, Evelina Dun, FNP is located in their office at time of visit.  I discussed the limitations, risks, security and privacy concerns of performing an evaluation and management service by telephone and the availability of in person appointments. I also discussed with the patient that there may be a patient responsible charge related to this service. The patient expressed understanding and agreed to proceed.   History and Present Illness:  HPI  PT calls today with bilateral achilles tendon pain that started on 05/25/18. She completed Levaquin on 05/26/18. She reports aching pain of 8 out 10. She reports the right leg is worsen than the left and the pain is worse in the morning.   She has not taken anything for it as she was unsure what to do for it.    Review of Systems  Musculoskeletal: Positive for joint pain.  All other systems reviewed and are negative.    Observations/Objective: No SOB or distress noted, Slightly anxious.   Assessment and Plan: 1. Achilles tendinitis of both lower extremities Added Levaquin to allergy list Ice Keep elevated Avoid high impact activities  Start diclofenac for next 5-7 days with food RTO if symptoms worsne or do not improve  - diclofenac (VOLTAREN) 75 MG EC tablet; Take 1 tablet (75 mg total) by mouth 2 (two) times daily.  Dispense: 14 tablet; Refill: 0     I discussed the assessment and treatment plan with the patient. The patient was provided an opportunity to ask questions and all were answered. The patient agreed with the plan and demonstrated an understanding of the instructions.   The patient was advised to call back or seek an in-person evaluation if  the symptoms worsen or if the condition fails to improve as anticipated.  The above assessment and management plan was discussed with the patient. The patient verbalized understanding of and has agreed to the management plan. Patient is aware to call the clinic if symptoms persist or worsen. Patient is aware when to return to the clinic for a follow-up visit. Patient educated on when it is appropriate to go to the emergency department.   Time call ended:  12:40 pm  I provided 19 minutes of non-face-to-face time during this encounter.    Evelina Dun, FNP

## 2018-07-02 ENCOUNTER — Telehealth: Payer: Self-pay | Admitting: Family Medicine

## 2018-07-02 MED ORDER — DOXYCYCLINE HYCLATE 100 MG PO TABS: 100.0000 mg | ORAL_TABLET | Freq: Two times a day (BID) | ORAL | 0 refills | Status: DC

## 2018-07-02 NOTE — Telephone Encounter (Signed)
APPT [facility]

## 2018-07-17 ENCOUNTER — Other Ambulatory Visit: Payer: Self-pay

## 2018-07-17 ENCOUNTER — Encounter: Payer: Self-pay | Admitting: Family

## 2018-07-17 ENCOUNTER — Ambulatory Visit (INDEPENDENT_AMBULATORY_CARE_PROVIDER_SITE_OTHER): Payer: Medicare Other | Admitting: Family

## 2018-07-17 DIAGNOSIS — J069 Acute upper respiratory infection, unspecified: Secondary | ICD-10-CM | POA: Diagnosis not present

## 2018-07-17 DIAGNOSIS — B370 Candidal stomatitis: Secondary | ICD-10-CM | POA: Diagnosis not present

## 2018-07-17 MED ORDER — NYSTATIN 100000 UNIT/ML MT SUSP: 5.0000 mL | Freq: Four times a day (QID) | OROMUCOSAL | 1 refills | Status: DC

## 2018-07-17 MED ORDER — FLUTICASONE PROPIONATE 50 MCG/ACT NA SUSP: 2.0000 | Freq: Every day | NASAL | 6 refills | Status: DC

## 2018-07-17 NOTE — Progress Notes (Signed)
   Virtual Visit via telephone Note  I connected with [name] on 07/17/18 at 12:06 pm  by telephone and verified that I am speaking with the correct person using two identifiers. DANIAH ZALDIVAR is currently located at home and no one is currently with her during visit. The provider, Evelina Dun, FNP is located in their office at time of visit.  I discussed the limitations, risks, security and privacy concerns of performing an evaluation and management service by telephone and the availability of in person appointments. I also discussed with the patient that there may be a patient responsible charge related to this service. The patient expressed understanding and agreed to proceed.   History and Present Illness:  Pt calls the office today with bilateral ear pain and "popping", sore throat, and rhinorrhea. She states she is currently taking doxycyline for a tick bite. She has noticed a white coating on her tongue and on her cheeks.  Otalgia  There is pain in both ears. This is a new problem. The current episode started yesterday. The problem occurs every few minutes. The problem has been unchanged. There has been no fever. The pain is mild. Associated symptoms include rhinorrhea and a sore throat. Pertinent negatives include no coughing, ear discharge, headaches or hearing loss. Treatments tried: peroxide. The treatment provided mild relief.      Review of Systems  HENT: Positive for ear pain, rhinorrhea and sore throat. Negative for ear discharge and hearing loss.   Respiratory: Negative for cough.   Neurological: Negative for headaches.  All other systems reviewed and are negative.    Observations/Objective: No SOB or distress noted  Assessment and Plan: HODA HON comes in today with chief complaint of No chief complaint on file.   Diagnosis and orders addressed:  1. Viral URI - Take meds as prescribed - Use a cool mist humidifier  -Use saline nose sprays  frequently -Force fluids -For any cough or congestion  Use plain Mucinex- regular strength or max strength is fine -For fever or aces or pains- take tylenol or ibuprofen. -Throat lozenges if help - fluticasone (FLONASE) 50 MCG/ACT nasal spray; Place 2 sprays into both nostrils daily.  Dispense: 16 g; Refill: 6  2. Oral thrush Rinse after meals Start probiotic  - nystatin (MYCOSTATIN) 100000 UNIT/ML suspension; Take 5 mLs (500,000 Units total) by mouth 4 (four) times daily.  Dispense: 473 mL; Refill: 1      I discussed the assessment and treatment plan with the patient. The patient was provided an opportunity to ask questions and all were answered. The patient agreed with the plan and demonstrated an understanding of the instructions.   The patient was advised to call back or seek an in-person evaluation if the symptoms worsen or if the condition fails to improve as anticipated.  The above assessment and management plan was discussed with the patient. The patient verbalized understanding of and has agreed to the management plan. Patient is aware to call the clinic if symptoms persist or worsen. Patient is aware when to return to the clinic for a follow-up visit. Patient educated on when it is appropriate to go to the emergency department.   Time call ended: 12:18 pm   I provided 12 minutes of non-face-to-face time during this encounter.    Evelina Dun, FNP

## 2018-07-19 ENCOUNTER — Telehealth: Payer: Self-pay | Admitting: Family

## 2018-07-19 MED ORDER — SULFAMETHOXAZOLE-TRIMETHOPRIM 400-80 MG PO TABS: 1.0000 | ORAL_TABLET | Freq: Two times a day (BID) | ORAL | 0 refills | Status: DC

## 2018-07-19 MED ORDER — CIPROFLOXACIN-DEXAMETHASONE 0.3-0.1 % OT SUSP: 4.0000 [drp] | Freq: Two times a day (BID) | OTIC | 0 refills | Status: DC

## 2018-07-19 NOTE — Telephone Encounter (Signed)
Pt aware.

## 2018-07-19 NOTE — Telephone Encounter (Signed)
I sent in a rx of bactrim and ciprodex ear drops for her to start.

## 2018-07-26 ENCOUNTER — Other Ambulatory Visit: Payer: Self-pay

## 2018-07-29 ENCOUNTER — Other Ambulatory Visit: Payer: Self-pay

## 2018-07-29 ENCOUNTER — Ambulatory Visit (INDEPENDENT_AMBULATORY_CARE_PROVIDER_SITE_OTHER): Payer: Medicare Other | Admitting: Family

## 2018-07-29 ENCOUNTER — Other Ambulatory Visit: Payer: Medicare Other

## 2018-07-29 ENCOUNTER — Encounter: Payer: Self-pay | Admitting: Family

## 2018-07-29 ENCOUNTER — Telehealth: Payer: Self-pay | Admitting: *Deleted

## 2018-07-29 VITALS — BP 115/75 | HR 91 | Temp 98.0°F | Ht 64.0 in | Wt 191.4 lb

## 2018-07-29 DIAGNOSIS — E782 Mixed hyperlipidemia: Secondary | ICD-10-CM | POA: Diagnosis not present

## 2018-07-29 DIAGNOSIS — Z20822 Contact with and (suspected) exposure to covid-19: Secondary | ICD-10-CM

## 2018-07-29 DIAGNOSIS — R5383 Other fatigue: Secondary | ICD-10-CM | POA: Diagnosis not present

## 2018-07-29 DIAGNOSIS — J4521 Mild intermittent asthma with (acute) exacerbation: Secondary | ICD-10-CM | POA: Diagnosis not present

## 2018-07-29 DIAGNOSIS — F4323 Adjustment disorder with mixed anxiety and depressed mood: Secondary | ICD-10-CM | POA: Diagnosis not present

## 2018-07-29 DIAGNOSIS — H9201 Otalgia, right ear: Secondary | ICD-10-CM | POA: Diagnosis not present

## 2018-07-29 DIAGNOSIS — K219 Gastro-esophageal reflux disease without esophagitis: Secondary | ICD-10-CM | POA: Diagnosis not present

## 2018-07-29 DIAGNOSIS — R6889 Other general symptoms and signs: Secondary | ICD-10-CM | POA: Diagnosis not present

## 2018-07-29 DIAGNOSIS — R509 Fever, unspecified: Secondary | ICD-10-CM

## 2018-07-29 DIAGNOSIS — I10 Essential (primary) hypertension: Secondary | ICD-10-CM | POA: Diagnosis not present

## 2018-07-29 NOTE — Patient Instructions (Signed)
Otitis Externa  Otitis externa is an infection of the outer ear canal. The outer ear canal is the area between the outside of the ear and the eardrum. Otitis externa is sometimes called swimmer's ear. What are the causes? Common causes of this condition include:  Swimming in dirty water.  Moisture in the ear.  An injury to the inside of the ear.  An object stuck in the ear.  A cut or scrape on the outside of the ear. What increases the risk? You are more likely to develop this condition if you go swimming often. What are the signs or symptoms? The first symptom of this condition is often itching in the ear. Later symptoms of the condition include:  Swelling of the ear.  Redness in the ear.  Ear pain. The pain may get worse when you pull on your ear.  Pus coming from the ear. How is this diagnosed? This condition may be diagnosed by examining the ear and testing fluid from the ear for bacteria and funguses. How is this treated? This condition may be treated with:  Antibiotic ear drops. These are often given for 10-14 days.  Medicines to reduce itching and swelling. Follow these instructions at home:  If you were prescribed antibiotic ear drops, use them as told by your health care provider. Do not stop using the antibiotic even if your condition improves.  Take over-the-counter and prescription medicines only as told by your health care provider.  Avoid getting water in your ears as told by your health care provider. This may include avoiding swimming or water sports for a few days.  Keep all follow-up visits as told by your health care provider. This is important. How is this prevented?  Keep your ears dry. Use the corner of a towel to dry your ears after you swim or bathe.  Avoid scratching or putting things in your ear. Doing these things can damage the ear canal or remove the protective wax that lines it, which makes it easier for bacteria and funguses to grow.   Avoid swimming in lakes, polluted water, or pools that may not have enough chlorine. Contact a health care provider if:  You have a fever.  Your ear is still red, swollen, painful, or draining pus after 3 days.  Your redness, swelling, or pain gets worse.  You have a severe headache.  You have redness, swelling, pain, or tenderness in the area behind your ear. Summary  Otitis externa is an infection of the outer ear canal.  Common causes include swimming in dirty water, moisture in the ear, or a cut or scrape in the ear.  Symptoms include pain, redness, and swelling of the ear.  If you were prescribed antibiotic ear drops, use them as told by your health care provider. Do not stop using the antibiotic even if your condition improves. This information is not intended to replace advice given to you by your health care provider. Make sure you discuss any questions you have with your health care provider. Document Released: 01/02/2005 Document Revised: 06/08/2017 Document Reviewed: 06/08/2017 Elsevier Patient Education  2020 [employer].

## 2018-07-29 NOTE — Telephone Encounter (Signed)
Sharion Balloon, FNP - [facility] Visit Summary  Diagnoses  Hypertension, Benign Essential, Goal Below 140/90 (Primary)  Mild intermittent asthma with acute exacerbation; Gastroesophageal reflux disease without esophagitis; Mixed hyperlipidemia; Adjustment disorder with mixed anxiety and depressed mood; Other fatigue; Right ear pain; Fever, unspecified fever cause   Provider sent message to COVID basket for scheduling. Call to patient- patient scheduled and orders placed.

## 2018-07-29 NOTE — Progress Notes (Signed)
Subjective:    Patient ID: Leah [name], female    DOB: 08/12/1950, 68 y.o.   MRN: [id number]  Chief Complaint  Patient presents with  . Otitis Media  . Hyperlipidemia    6 mos ckup  . Hypertension  . Gastroesophageal Reflux  . Asthma   Pt presents to the office today for chronic follow up. She reports about two weeks she woke up with a sore throat and had left ear pain and then her sore improved, but continues to have ear pain. She was given cipro- dex drops and started on Bactrim. She reports her ear pain is improved, but her hearing is muffled. She reports last week she had fevers, abdominal pain, and constipation. She states she got OTC medication for constipation and that resolved, but since she has had fatigue and no appetite.  Hyperlipidemia This is a chronic problem. The current episode started more than 1 year ago. The problem is uncontrolled. Recent lipid tests were reviewed and are high. Exacerbating diseases include obesity. Pertinent negatives include no shortness of breath. Current antihyperlipidemic treatment includes diet change. The current treatment provides no improvement of lipids. Risk factors for coronary artery disease include diabetes mellitus, dyslipidemia, hypertension, a sedentary lifestyle and post-menopausal.  Hypertension This is a chronic problem. The current episode started more than 1 year ago. The problem has been resolved since onset. The problem is controlled. Associated symptoms include anxiety, headaches and malaise/fatigue. Pertinent negatives include no peripheral edema or shortness of breath. Risk factors for coronary artery disease include dyslipidemia, sedentary lifestyle and obesity. The current treatment provides moderate improvement. There is no history of CAD/MI or heart failure.  Gastroesophageal Reflux She reports no belching, no coughing, no sore throat or no wheezing. This is a chronic problem. The current episode started more than 1 year  ago. The problem occurs occasionally. The problem has been waxing and waning. The symptoms are aggravated by certain foods. She has tried a PPI for the symptoms. The treatment provided moderate relief.  Asthma There is no cough, shortness of breath or wheezing. This is a chronic problem. The current episode started more than 1 year ago. The problem occurs intermittently. The problem has been waxing and waning. Associated symptoms include ear congestion, ear pain, headaches and malaise/fatigue. Pertinent negatives include no rhinorrhea or sore throat. Her symptoms are alleviated by leukotriene antagonist. She reports moderate improvement on treatment. Her past medical history is significant for asthma.  Depression        This is a chronic problem.  The current episode started more than 1 year ago.   The problem occurs intermittently.  Associated symptoms include irritable, restlessness, headaches and sad.  Associated symptoms include no decreased concentration, no helplessness and no hopelessness.  Past treatments include nothing.  Compliance with treatment is good.  Past medical history includes anxiety.   Anxiety Presents for follow-up visit. Symptoms include excessive worry, nervous/anxious behavior and restlessness. Patient reports no decreased concentration, depressed mood, hyperventilation or shortness of breath. Symptoms occur rarely. The severity of symptoms is mild.   Her past medical history is significant for asthma.  Otalgia  There is pain in the right ear. This is a new problem. The current episode started 1 to 4 weeks ago. The problem occurs constantly. The problem has been waxing and waning. There has been no fever. The pain is at a severity of 1/10. The pain is mild. Associated symptoms include headaches and hearing loss. Pertinent negatives include no coughing, ear  discharge, rhinorrhea or sore throat. She has tried ear drops and antibiotics for the symptoms. The treatment provided mild  relief.      Review of Systems  Constitutional: Positive for malaise/fatigue.  HENT: Positive for ear pain and hearing loss. Negative for ear discharge, rhinorrhea and sore throat.   Respiratory: Negative for cough, shortness of breath and wheezing.   Neurological: Positive for headaches.  Psychiatric/Behavioral: Positive for depression. Negative for decreased concentration. The patient is nervous/anxious.   All other systems reviewed and are negative.      Objective:   Physical Exam Vitals signs reviewed.  Constitutional:      General: She is irritable. She is not in acute distress.    Appearance: She is well-developed.  HENT:     Head: Normocephalic and atraumatic.     Right Ear: Tympanic membrane normal. Tenderness present.     Left Ear: Tympanic membrane normal.  Eyes:     Pupils: Pupils are equal, round, and reactive to light.  Neck:     Musculoskeletal: Normal range of motion and neck supple.     Thyroid: No thyromegaly.  Cardiovascular:     Rate and Rhythm: Normal rate and regular rhythm.     Heart sounds: Normal heart sounds. No murmur.  Pulmonary:     Effort: Pulmonary effort is normal. No respiratory distress.     Breath sounds: Normal breath sounds. No wheezing.  Abdominal:     General: Bowel sounds are normal. There is no distension.     Palpations: Abdomen is soft.     Tenderness: There is no abdominal tenderness.  Musculoskeletal: Normal range of motion.        General: No tenderness.  Skin:    General: Skin is warm and dry.  Neurological:     Mental Status: She is alert and oriented to person, place, and time.     Cranial Nerves: No cranial nerve deficit.     Deep Tendon Reflexes: Reflexes are normal and symmetric.  Psychiatric:        Behavior: Behavior normal.        Thought Content: Thought content normal.        Judgment: Judgment normal.     BP 115/75   Pulse 91   Temp 98 F (36.7 C) (Oral)   Ht '5\' 4"'$  (1.626 m)   Wt 191 lb 6.4 oz (86.8  kg)   BMI 32.85 kg/m      Assessment & Plan:  Leah [name] comes in today with chief complaint of Otitis Media, Hyperlipidemia (6 mos ckup), Hypertension, Gastroesophageal Reflux, and Asthma   Diagnosis and orders addressed:  1. Hypertension, benign essential, goal below 140/90 - CMP14+EGFR - CBC with Differential/Platelet  2. Mild intermittent asthma with acute exacerbation - CMP14+EGFR - CBC with Differential/Platelet  3. Gastroesophageal reflux disease without esophagitis - CMP14+EGFR - CBC with Differential/Platelet  4. Mixed hyperlipidemia - CMP14+EGFR - CBC with Differential/Platelet  5. Adjustment disorder with mixed anxiety and depressed mood - CMP14+EGFR - CBC with Differential/Platelet  6. Other fatigue - CMP14+EGFR - CBC with Differential/Platelet  7. Right ear pain - CMP14+EGFR - CBC with Differential/Platelet  8. Fever, unspecified fever cause - CMP14+EGFR - CBC with Differential/Platelet   Labs pending Health Maintenance reviewed Diet and exercise encouraged  Follow up plan: 6 months    Evelina Dun, FNP

## 2018-07-30 ENCOUNTER — Other Ambulatory Visit: Payer: Self-pay | Admitting: Family

## 2018-07-30 DIAGNOSIS — D709 Neutropenia, unspecified: Secondary | ICD-10-CM

## 2018-07-30 LAB — CBC WITH DIFFERENTIAL/PLATELET
Basophils Absolute: 0 10*3/uL (ref 0.0–0.2)
Basos: 1 %
EOS (ABSOLUTE): 0.2 10*3/uL (ref 0.0–0.4)
Eos: 8 %
Hematocrit: 40.7 % (ref 34.0–46.6)
Hemoglobin: 13.7 g/dL (ref 11.1–15.9)
Immature Grans (Abs): 0 10*3/uL (ref 0.0–0.1)
Immature Granulocytes: 1 %
Lymphocytes Absolute: 0.6 10*3/uL — ABNORMAL LOW (ref 0.7–3.1)
Lymphs: 34 %
MCH: 27.5 pg (ref 26.6–33.0)
MCHC: 33.7 g/dL (ref 31.5–35.7)
MCV: 82 fL (ref 79–97)
Monocytes Absolute: 0.3 10*3/uL (ref 0.1–0.9)
Monocytes: 18 %
Neutrophils Absolute: 0.8 10*3/uL — ABNORMAL LOW (ref 1.4–7.0)
Neutrophils: 38 %
Platelets: 189 10*3/uL (ref 150–450)
RBC: 4.99 x10E6/uL (ref 3.77–5.28)
RDW: 13 % (ref 11.7–15.4)
WBC: 1.9 10*3/uL — CL (ref 3.4–10.8)

## 2018-07-30 LAB — CMP14+EGFR
ALT: 30 IU/L (ref 0–32)
AST: 26 IU/L (ref 0–40)
Albumin/Globulin Ratio: 2 (ref 1.2–2.2)
Albumin: 4.3 g/dL (ref 3.8–4.8)
Alkaline Phosphatase: 80 IU/L (ref 39–117)
BUN/Creatinine Ratio: 10 — ABNORMAL LOW (ref 12–28)
BUN: 15 mg/dL (ref 8–27)
Bilirubin Total: 0.3 mg/dL (ref 0.0–1.2)
CO2: 22 mmol/L (ref 20–29)
Calcium: 9 mg/dL (ref 8.7–10.3)
Chloride: 99 mmol/L (ref 96–106)
Creatinine, Ser: 1.46 mg/dL — ABNORMAL HIGH (ref 0.57–1.00)
GFR calc Af Amer: 43 mL/min/{1.73_m2} — ABNORMAL LOW (ref >59)
GFR calc non Af Amer: 37 mL/min/{1.73_m2} — ABNORMAL LOW (ref >59)
Globulin, Total: 2.2 g/dL (ref 1.5–4.5)
Glucose: 110 mg/dL — ABNORMAL HIGH (ref 65–99)
Potassium: 4.8 mmol/L (ref 3.5–5.2)
Sodium: 138 mmol/L (ref 134–144)
Total Protein: 6.5 g/dL (ref 6.0–8.5)

## 2018-07-31 ENCOUNTER — Ambulatory Visit: Payer: Medicare Other | Admitting: Family Medicine

## 2018-08-02 LAB — NOVEL CORONAVIRUS, NAA: SARS-CoV-2, NAA: NOT DETECTED

## 2018-08-05 ENCOUNTER — Telehealth: Payer: Self-pay | Admitting: Family

## 2018-08-05 DIAGNOSIS — E782 Mixed hyperlipidemia: Secondary | ICD-10-CM

## 2018-08-05 NOTE — Telephone Encounter (Signed)
Can we please add Lipid panel on that lab work. She can call the [facility] office and make an appointment since the referral is already in.

## 2018-08-05 NOTE — Telephone Encounter (Signed)
Patient wants to go to [facility].

## 2018-08-05 NOTE — Telephone Encounter (Signed)
Patient states she had a referral placed for hematology in [place] and patient states she wants to go to [facility] instead.   Also patient would like her cholesterol checked from last lab.   Please advise

## 2018-08-06 ENCOUNTER — Telehealth: Payer: Self-pay | Admitting: Hematology

## 2018-08-06 NOTE — Telephone Encounter (Signed)
Pt cld wanting to be scheduled at the [facility] instead of [facility] for a hem appt. She has been scheduled to see Dr. Irene Limbo on 8/6 at 1pm

## 2018-08-06 NOTE — Addendum Note (Signed)
Addended byCarrolyn Leigh on: 08/06/2018 02:04 PM   Modules accepted: Orders

## 2018-08-06 NOTE — Telephone Encounter (Signed)
Pt aware to call [facility] for appt in [place] and lipid panel was ordered, she will be in this week

## 2018-08-09 ENCOUNTER — Other Ambulatory Visit: Payer: Self-pay

## 2018-08-09 ENCOUNTER — Other Ambulatory Visit: Payer: Medicare Other

## 2018-08-09 DIAGNOSIS — E782 Mixed hyperlipidemia: Secondary | ICD-10-CM | POA: Diagnosis not present

## 2018-08-09 LAB — LIPID PANEL
Chol/HDL Ratio: 5.1 ratio — ABNORMAL HIGH (ref 0.0–4.4)
Cholesterol, Total: 228 mg/dL — ABNORMAL HIGH (ref 100–199)
HDL: 45 mg/dL (ref >39)
LDL Calculated: 156 mg/dL — ABNORMAL HIGH (ref 0–99)
Triglycerides: 137 mg/dL (ref 0–149)
VLDL Cholesterol Cal: 27 mg/dL (ref 5–40)

## 2018-08-12 ENCOUNTER — Ambulatory Visit (HOSPITAL_COMMUNITY): Payer: Medicare Other | Admitting: Hematology

## 2018-08-14 ENCOUNTER — Other Ambulatory Visit: Payer: Self-pay | Admitting: Family

## 2018-08-14 DIAGNOSIS — H9201 Otalgia, right ear: Secondary | ICD-10-CM

## 2018-08-14 MED ORDER — ATORVASTATIN CALCIUM 20 MG PO TABS: 20.0000 mg | ORAL_TABLET | Freq: Every day | ORAL | 1 refills | Status: DC

## 2018-08-15 ENCOUNTER — Other Ambulatory Visit: Payer: Self-pay | Admitting: Family Medicine

## 2018-08-15 ENCOUNTER — Other Ambulatory Visit: Payer: Self-pay | Admitting: Family

## 2018-08-15 DIAGNOSIS — Z1231 Encounter for screening mammogram for malignant neoplasm of breast: Secondary | ICD-10-CM

## 2018-08-16 ENCOUNTER — Ambulatory Visit
Admission: RE | Admit: 2018-08-16 | Discharge: 2018-08-16 | Disposition: A | Discharge status: Home / Self Care | Payer: Medicare Other | Source: Ambulatory Visit | Attending: Family | Admitting: Family

## 2018-08-16 ENCOUNTER — Other Ambulatory Visit: Payer: Self-pay

## 2018-08-16 DIAGNOSIS — Z1231 Encounter for screening mammogram for malignant neoplasm of breast: Secondary | ICD-10-CM | POA: Diagnosis not present

## 2018-08-19 ENCOUNTER — Telehealth: Payer: Self-pay | Admitting: *Deleted

## 2018-08-19 MED ORDER — ROSUVASTATIN CALCIUM 5 MG PO TABS: 5.0000 mg | ORAL_TABLET | Freq: Every day | ORAL | 1 refills | Status: DC

## 2018-08-19 NOTE — Addendum Note (Signed)
Addended by: Evelina Dun A on: 08/19/2018 04:47 PM   Modules accepted: Orders

## 2018-08-19 NOTE — Telephone Encounter (Signed)
Fax from [facility] Atorvastatin 20 mg Pt cannot tolerate Atorvastatin d/t muscle pain, has managed Rosuvastatin before

## 2018-08-19 NOTE — Telephone Encounter (Signed)
Lipitor changed to Crestor. Prescription sent to pharmacy

## 2018-08-21 NOTE — Progress Notes (Signed)
HEMATOLOGY/ONCOLOGY CONSULTATION NOTE  Date of Service: 08/22/2018  Patient Care Team: Sharion Balloon, FNP as PCP - General (Family Medicine) Burnell Blanks, MD as PCP - Cardiology (Cardiology) Gala Romney Cristopher Estimable, MD as Consulting Physician (Gastroenterology)  CHIEF COMPLAINTS/PURPOSE OF CONSULTATION:  Neutropenia  HISTORY OF PRESENTING ILLNESS:   Leah [name] is a wonderful 68 y.o. female who has been referred to [place] by Dr. Evelina Dun for evaluation and management of neutropenia. The pt reports that she is doing well overall.  The pt reports that her neutropenia was found on routine blood tests and that it has been present for 10+ years. Her most recent labs in our system are from 2017.  The pt has had frequent ear infections over the past 2 months and has almost no hearing in her left ear at this time. She reports ear "popping," but no pain. She tried prednisone for 1 week, levofloxacin, and doxycyline, none of which helped her symptoms. She will be seeing an ENT in the near future.  She also woke up one morning and her mouth was raw. She was told that it was likely a reaction to levofloxacin and was put on Nystatin. This is resolved.  The pt has been feeling fatigued recently. She used to do all of her yard work but now had someone else do it. She has gained almost 20 lbs in the past month.  She reports a fever 2 weeks ago, but it went away with antibiotic use. Her BP medication changed from Lisinopril to Losartan about 6 months ago. She takes fish oil and Claritin. Her allergies have not been bad recently. She also had asthma, but had not had to use her Neubulizer in a long time.  She was diagnosed about 2 years ago with Alpha gal disease, so she does not eat beef or pork. She was experiencing body aches and could not sleep.  She has arthritis, which has gotten worse overtime. It was diagnosed 10 years ago. She thinks it is degenerative, but is not sure.  Most  recent lab results (07/29/2018) of CBC is as follows: all values are WNL except for WBC at 1.9k, Neutrophils abs at 0.8k, lymphocytes abs at 0.6k.  On review of systems, pt reports ear pain, fatigue, loss of hearing in left ear, fever 2 wks ago, and denies bad infections in the past year and any other symptoms.   On PMHx the pt reports esophageal reflux, mild asthma, alpha gal disease, degenerative ? arthritis in her hands On Social Hx the pt denies chemical exposure, drinks wine 2x/week, and quit smoking 20+ years ago On Family Hx the pt reports that her mother passed away from MM at 74 y.o. and also had arthritis and dementia  MEDICAL HISTORY:  Past Medical History:  Diagnosis Date   Allergy    Allergy to alpha-gal    Anxiety    Arthritis    Asthma    CAD (coronary artery disease)    Colon polyp    Depression    Food allergy    GERD (gastroesophageal reflux disease)    Hyperlipidemia    Hypertension    Urticaria     SURGICAL HISTORY: Past Surgical History:  Procedure Laterality Date   ABDOMINAL HYSTERECTOMY     Carpel Tunnel     CHOLECYSTECTOMY     COLONOSCOPY  02/08/2005   SAY:[id number] hemorrhoids otherwise normal rectum/ A few scattered, shallow, left-sided sigmoid diverticula/The remainder of the colonic mucosa appeared normal  COLONOSCOPY WITH ESOPHAGOGASTRODUODENOSCOPY (EGD) N/A 11/29/2012   Procedure: COLONOSCOPY WITH ESOPHAGOGASTRODUODENOSCOPY (EGD);  Surgeon: Daneil Dolin, MD;  Location: [facility] ENDO SUITE;  Service: Endoscopy;  Laterality: N/A;  10:45   CYST REMOVAL TRUNK     HERNIA REPAIR     ventral X 2 with mesh, last one 2011 ([place]). complicated with 2 week hospital stay   [place]     with mesh   NISSEN FUNDOPLICATION  [id number]   POLYPECTOMY     TUBAL LIGATION      SOCIAL HISTORY: Social History   Socioeconomic History   Marital status: Divorced    Spouse name: Not on file   Number of children: 3   Years of  education: Not on file   Highest education level: Not on file  Occupational History   Occupation: private duty care assistant  Social Needs   Financial resource strain: Not on file   Food insecurity    Worry: Not on file    Inability: Not on file   Transportation needs    Medical: Not on file    Non-medical: Not on file  Tobacco Use   Smoking status: Former Smoker    Packs/day: 0.50    Years: 28.00    Pack years: 14.00    Types: Cigarettes    Quit date: 01/17/1996    Years since quitting: 22.6   Smokeless tobacco: Never Used  Substance and Sexual Activity   Alcohol use: Yes    Alcohol/week: 0.0 standard drinks    Comment: occasional use of wine    Drug use: No   Sexual activity: Yes    Partners: Male  Lifestyle   Physical activity    Days per week: Not on file    Minutes per session: Not on file   Stress: Not on file  Relationships   Social connections    Talks on phone: Not on file    Gets together: Not on file    Attends religious service: Not on file    Active member of club or organization: Not on file    Attends meetings of clubs or organizations: Not on file    Relationship status: Not on file   Intimate partner violence    Fear of current or ex partner: Not on file    Emotionally abused: Not on file    Physically abused: Not on file    Forced sexual activity: Not on file  Other Topics Concern   Not on file  Social History Narrative   Not on file    FAMILY HISTORY: Family History  Problem Relation Age of Onset   Heart disease Mother    Alzheimer's disease Mother    Diabetes Mother    Other Father        deceased age 16 from some sort of GI problems but no malignancy   Diabetes Sister    Asthma Sister    Colon polyps Sister    Diabetes Brother    Cancer Cousin        pancreatic   Myasthenia gravis Daughter    Diabetes Daughter    Colon cancer Neg Hx    Stomach cancer Neg Hx    Esophageal cancer Neg Hx    Rectal  cancer Neg Hx     ALLERGIES:  is allergic to codeine; levaquin [levofloxacin]; meat [alpha-gal]; penicillins; praluent [alirocumab]; and strawberry (diagnostic).  MEDICATIONS:  Current Outpatient Medications  Medication Sig Dispense Refill   albuterol (PROVENTIL) (2.5 MG/3ML) 0.083%  nebulizer solution Take 3 mLs (2.5 mg total) by nebulization every 6 (six) hours as needed for wheezing or shortness of breath. 150 mL 6  °• aspirin 81 MG tablet Take 81 mg by mouth daily.    °• calcium carbonate (TUMS) 500 MG chewable tablet Chew 1 tablet (200 mg of elemental calcium total) by mouth as needed for indigestion or heartburn.    °• Cholecalciferol (VITAMIN D) 2000 UNITS tablet Take 2,000 Units by mouth daily.    °• ciprofloxacin-dexamethasone (CIPRODEX) OTIC suspension Place 4 drops into both ears 2 (two) times daily. 7.5 mL 0  °• EPINEPHrine (EPIPEN 2-PAK) 0.3 mg/0.3 mL IJ SOAJ injection Inject into the muscle once.    °• famotidine (PEPCID) 40 MG tablet Take 1 tablet (40 mg total) by mouth daily. 30 tablet 5  °• fish oil-omega-3 fatty acids 1000 MG capsule Take 2 g by mouth daily.     °• fluticasone (FLONASE) 50 MCG/ACT nasal spray Place 2 sprays into both nostrils daily. 16 g 6  °• Fluticasone Furoate (ARNUITY ELLIPTA) 100 MCG/ACT AEPB Inhale 1 Dose into the lungs daily. 30 each 5  °• loratadine (CLARITIN) 10 MG tablet Take 10 mg by mouth 2 (two) times daily.    °• losartan-hydrochlorothiazide (HYZAAR) 100-12.5 MG tablet Take 1 tablet by mouth daily. 30 tablet 2  °• montelukast (SINGULAIR) 10 MG tablet TAKE ONE TABLET AT BEDTIME 30 tablet 3  °• nystatin (MYCOSTATIN) 100000 UNIT/ML suspension Take 5 mLs (500,000 Units total) by mouth 4 (four) times daily. 473 mL 1  °• omeprazole (PRILOSEC) 40 MG capsule Take 1 capsule (40 mg total) by mouth every morning. (Patient taking differently: Take 40 mg by mouth daily. ) 30 capsule 5  °• PROAIR HFA 108 (90 Base) MCG/ACT inhaler 2 PUFFS EVERY 6 HOURS AS NEEDED FOR WHEEZING  OR SHORTNESS OF BREATH 8.5 g 0  °• rosuvastatin (CRESTOR) 5 MG tablet Take 1 tablet (5 mg total) by mouth daily. 90 tablet 1  ° °No current facility-administered medications for this visit.   ° ° °REVIEW OF SYSTEMS:   ° °A 10+ POINT REVIEW OF SYSTEMS WAS OBTAINED including neurology, dermatology, psychiatry, cardiac, respiratory, lymph, extremities, GI, GU, Musculoskeletal, constitutional, breasts, reproductive, HEENT.  All pertinent positives are noted in the HPI.  All others are negative.  ° °PHYSICAL EXAMINATION: °ECOG PERFORMANCE STATUS: 1 - Symptomatic but completely ambulatory ° °. °Vitals:  ° 08/22/18 1256  °BP: (!) 152/92  °Pulse: 95  °Resp: 18  °Temp: 97.8 °F (36.6 °C)  °SpO2: 99%  ° °Filed Weights  ° 08/22/18 1256  °Weight: 198 lb 9.6 oz (90.1 kg)  ° °.Body mass index is 34.09 kg/m². ° °GENERAL:alert, in no acute distress and comfortable °SKIN: no acute rashes, no significant lesions °EYES: conjunctiva are pink and non-injected, sclera anicteric °OROPHARYNX: MMM, no exudates, no oropharyngeal erythema or ulceration °NECK: supple, no JVD °LYMPH:  no palpable lymphadenopathy in the cervical, axillary or inguinal regions °LUNGS: clear to auscultation b/l with normal respiratory effort °HEART: regular rate & rhythm °ABDOMEN:  normoactive bowel sounds , non tender, not distended. °Extremity: no pedal edema °PSYCH: alert & oriented x 3 with fluent speech °NEURO: no focal motor/sensory deficits ° °LABORATORY DATA:  °I have reviewed the data as listed ° °. °CBC Latest Ref Rng & Units 08/22/2018 07/29/2018 01/30/2018  °WBC 4.0 - 10.5 K/uL 3.4(L) 1.9(LL) 2.7(L)  °Hemoglobin 12.0 - 15.0 g/dL 12.5 13.7 11.8  °Hematocrit 36.0 - 46.0 % 38.1 40.7 34.7  °Platelets   150 - 400 K/uL 267 189 288   ANC 800 __>1500 . CMP Latest Ref Rng & Units 08/22/2018 07/29/2018 01/30/2018  Glucose 70 - 99 mg/dL 99 110(H) 82  BUN 8 - 23 mg/dL _0 Creatinine 0.44 - 1.00 mg/dL 0.91 1.46(H) 0.92  Sodium 135 - 145 mmol/L 141 138 140    Potassium 3.5 - 5.1 mmol/L 4.0 4.8 4.0  Chloride 98 - 111 mmol/L 106 99 101  CO2 22 - 32 mmol/L _1 Calcium 8.9 - 10.3 mg/dL 9.5 9.0 9.0  Total Protein 6.5 - 8.1 g/dL 7.0 6.5 5.8(L)  Total Bilirubin 0.3 - 1.2 mg/dL 0.4 0.3 0.2  Alkaline Phos 38 - 126 U/L 86 80 79  AST 15 - 41 U/L _2 ALT 0 - 44 U/L _3 RADIOGRAPHIC STUDIES: I have personally reviewed the radiological images as listed and agreed with the findings in the report. Mm 3d Screen Breast Bilateral  Result Date: 08/16/2018 CLINICAL DATA:  Screening. EXAM: DIGITAL SCREENING BILATERAL MAMMOGRAM WITH TOMO AND CAD COMPARISON:  Previous exam(s). ACR Breast Density Category b: There are scattered areas of fibroglandular density. FINDINGS: There are no findings suspicious for malignancy. Images were processed with CAD. IMPRESSION: No mammographic evidence of malignancy. A result letter of this screening mammogram will be mailed directly to the patient. RECOMMENDATION: Screening mammogram in one year. (Code:SM-B-01Y) BI-RADS CATEGORY  1: Negative. Electronically Signed   By: Ammie Ferrier M.D.   On: 08/16/2018 16:07    ASSESSMENT & PLAN:  #1 Neutropenia -blood tests today -likely benign ethnic neutropenia -recent ear infections -- scheduled to see an ENT soon -mouth infection in the past 2 months -recent antibiotic and prednisone use  PLAN: -Discussed patient's most recent labs from 07/29/2018, WBC slightly low, lymphocytes are low but have not been low before, no anemia, PLTs are normal -Discussed that the pt's WBC were low in 2017, then normalized, and are now low again. -Discussed that common causes of neutropenia include: vitamin deficiencies, certain medications, autoimmune diseases, and benign ethnic neutropenia -The patient's recent use of prednisone and levofloxacin could explain her low WBC -Given that the pt has been neutropenic for 10+ years, the most likely explanation is benign ethnic  neutropenia -Blood work today -Will see the pt back in 2 weeks by phone   Labs today Phone visit in 2 weeks   Orders Placed This Encounter  Procedures   CBC with Differential/Platelet    Standing Status:   Future    Standing Expiration Date:   09/26/2019   CMP ([place] only)    Standing Status:   Future    Standing Expiration Date:   08/22/2019   Lactate dehydrogenase    Standing Status:   Future    Standing Expiration Date:   08/22/2019   Sedimentation rate    Standing Status:   Future    Standing Expiration Date:   08/22/2019   Multiple Myeloma Panel (SPEP&IFE w/QIG)    Standing Status:   Future    Standing Expiration Date:   08/22/2019   Kappa/lambda light chains    Standing Status:   Future    Standing Expiration Date:   09/26/2019   Vitamin B12    Standing Status:   Future    Standing Expiration Date:   08/22/2019   Copper, serum    Standing Status:   Future    Standing Expiration Date:   08/22/2019  ANA, IFA (with reflex)  °  Standing Status:   Future  °  Standing Expiration Date:   08/22/2019  °• Hepatitis C antibody  °  Standing Status:   Future  °  Standing Expiration Date:   08/22/2019  ° ° °All of the patients questions were answered with apparent satisfaction. The patient knows to call the clinic with any problems, questions or concerns. ° °I spent 35 minutes counseling the patient face to face. The total time spent in the appointment was 45 and more than 50% was on counseling and direct patient cares. ° °  °Gautam Kale MD MS AAHIVMS SCH CTH °Hematology/Oncology Physician °[facility] ° °(Office):       [phone] °(Work cell):  [phone] °(Fax):           [phone] ° °08/22/2018 2:13 PM ° °I, Emily Tufford, am acting as a scribe for Dr. Kale ° °.I have reviewed the above documentation for accuracy and completeness, and I agree with the above. °.Gautam Kishore Kale MD °  °

## 2018-08-22 ENCOUNTER — Telehealth: Payer: Self-pay | Admitting: Hematology

## 2018-08-22 ENCOUNTER — Inpatient Hospital Stay: Payer: Medicare Other

## 2018-08-22 ENCOUNTER — Other Ambulatory Visit: Payer: Self-pay

## 2018-08-22 ENCOUNTER — Inpatient Hospital Stay: Payer: Medicare Other | Attending: Hematology | Admitting: Hematology

## 2018-08-22 VITALS — BP 152/92 | HR 95 | Temp 97.8°F | Resp 18 | Ht 64.0 in | Wt 198.6 lb

## 2018-08-22 DIAGNOSIS — F419 Anxiety disorder, unspecified: Secondary | ICD-10-CM

## 2018-08-22 DIAGNOSIS — D709 Neutropenia, unspecified: Secondary | ICD-10-CM

## 2018-08-22 DIAGNOSIS — Z90710 Acquired absence of both cervix and uterus: Secondary | ICD-10-CM | POA: Diagnosis not present

## 2018-08-22 DIAGNOSIS — E785 Hyperlipidemia, unspecified: Secondary | ICD-10-CM | POA: Insufficient documentation

## 2018-08-22 DIAGNOSIS — I10 Essential (primary) hypertension: Secondary | ICD-10-CM

## 2018-08-22 DIAGNOSIS — Z87891 Personal history of nicotine dependence: Secondary | ICD-10-CM

## 2018-08-22 DIAGNOSIS — J45909 Unspecified asthma, uncomplicated: Secondary | ICD-10-CM | POA: Insufficient documentation

## 2018-08-22 DIAGNOSIS — H9192 Unspecified hearing loss, left ear: Secondary | ICD-10-CM | POA: Insufficient documentation

## 2018-08-22 DIAGNOSIS — Z7982 Long term (current) use of aspirin: Secondary | ICD-10-CM | POA: Diagnosis not present

## 2018-08-22 DIAGNOSIS — Z79899 Other long term (current) drug therapy: Secondary | ICD-10-CM

## 2018-08-22 DIAGNOSIS — F329 Major depressive disorder, single episode, unspecified: Secondary | ICD-10-CM | POA: Insufficient documentation

## 2018-08-22 DIAGNOSIS — D72810 Lymphocytopenia: Secondary | ICD-10-CM

## 2018-08-22 LAB — CMP (CANCER CENTER ONLY)
ALT: 26 U/L (ref 0–44)
AST: 20 U/L (ref 15–41)
Albumin: 3.6 g/dL (ref 3.5–5.0)
Alkaline Phosphatase: 86 U/L (ref 38–126)
Anion gap: 10 (ref 5–15)
BUN: 14 mg/dL (ref 8–23)
CO2: 25 mmol/L (ref 22–32)
Calcium: 9.5 mg/dL (ref 8.9–10.3)
Chloride: 106 mmol/L (ref 98–111)
Creatinine: 0.91 mg/dL (ref 0.44–1.00)
GFR, Est AFR Am: >60 mL/min (ref >60)
GFR, Estimated: >60 mL/min (ref >60)
Glucose, Bld: 99 mg/dL (ref 70–99)
Potassium: 4 mmol/L (ref 3.5–5.1)
Sodium: 141 mmol/L (ref 135–145)
Total Bilirubin: 0.4 mg/dL (ref 0.3–1.2)
Total Protein: 7 g/dL (ref 6.5–8.1)

## 2018-08-22 LAB — CBC WITH DIFFERENTIAL/PLATELET
Abs Immature Granulocytes: 0.02 10*3/uL (ref 0.00–0.07)
Basophils Absolute: 0 10*3/uL (ref 0.0–0.1)
Basophils Relative: 1 %
Eosinophils Absolute: 0.1 10*3/uL (ref 0.0–0.5)
Eosinophils Relative: 3 %
HCT: 38.1 % (ref 36.0–46.0)
Hemoglobin: 12.5 g/dL (ref 12.0–15.0)
Immature Granulocytes: 1 %
Lymphocytes Relative: 40 %
Lymphs Abs: 1.4 10*3/uL (ref 0.7–4.0)
MCH: 27.7 pg (ref 26.0–34.0)
MCHC: 32.8 g/dL (ref 30.0–36.0)
MCV: 84.5 fL (ref 80.0–100.0)
Monocytes Absolute: 0.4 10*3/uL (ref 0.1–1.0)
Monocytes Relative: 10 %
Neutro Abs: 1.5 10*3/uL — ABNORMAL LOW (ref 1.7–7.7)
Neutrophils Relative %: 45 %
Platelets: 267 10*3/uL (ref 150–400)
RBC: 4.51 MIL/uL (ref 3.87–5.11)
RDW: 14.3 % (ref 11.5–15.5)
WBC: 3.4 10*3/uL — ABNORMAL LOW (ref 4.0–10.5)
nRBC: 0 % (ref 0.0–0.2)

## 2018-08-22 LAB — LACTATE DEHYDROGENASE: LDH: 176 U/L (ref 98–192)

## 2018-08-22 LAB — VITAMIN B12: Vitamin B-12: 257 pg/mL (ref 180–914)

## 2018-08-22 LAB — SEDIMENTATION RATE: Sed Rate: 17 mm/hr (ref 0–22)

## 2018-08-22 NOTE — Patient Instructions (Signed)
Thank you for choosing [facility] to provide your oncology and hematology care.   Should you have questions after your visit to the [facility] ([facility]), please contact this office at [phone] between 8:30 AM and 4:30 PM. Voicemails left after 4:00 PM may not be returned until the following business day. Calls received after 4:30 PM will be answered by an off-site Nurse Triage Line.    Prescription Refills:  Please have your pharmacy contact us directly for most prescription requests.  Contact the office directly for refills of narcotics (pain medications). Allow 48-72 hours for refills.  Appointments: Please contact the [facility] scheduling department [phone] for questions regarding [facility] appointment scheduling.  Contact the schedulers with any scheduling changes so that your appointment can be rescheduled in a timely manner.   Central Scheduling for [facility] [phone] - Call to schedule procedures such as PET scans, CT scans, MRI, Ultrasound, etc.  To afford each patient quality time with our providers, please arrive 30 minutes before your scheduled appointment time.  If you arrive late for your appointment, you may be asked to reschedule.  We strive to give you quality time with our providers, and arriving late affects you and other patients whose appointments are after yours. If you are a no show for multiple scheduled visits, you may be dismissed from the clinic at the providers discretion.     Resources: [facility] Social Workers [phone] for additional information on assistance programs --Anne Cunningham/Abigail Elmore  [place] DSS  [phone]: Information regarding food stamps, Medicaid, and utility assistance [employer] [phone]   [place] [employer]'s shared-ride transportation service for eligible riders who have a disability that prevents them from riding the fixed route bus.   Medicare Rights Center 800-333-4114 Helps people with  Medicare understand their rights and benefits, navigate the Medicare system, and secure the quality healthcare they deserve [employer] 800-227-2345 Assists patients locate various types of support and financial assistance Cancer Care: 1-800-813-HOPE (4673) Provides financial assistance, online support groups, medication/co-pay assistance.      

## 2018-08-22 NOTE — Telephone Encounter (Signed)
Scheduled appt per 8/6 los. ° °Printed calendar and avs. °

## 2018-08-23 LAB — KAPPA/LAMBDA LIGHT CHAINS
Kappa free light chain: 17.2 mg/L (ref 3.3–19.4)
Kappa, lambda light chain ratio: 1.4 (ref 0.26–1.65)
Lambda free light chains: 12.3 mg/L (ref 5.7–26.3)

## 2018-08-23 LAB — HEPATITIS C ANTIBODY: HCV Ab: <0.1 s/co ratio (ref 0.0–0.9)

## 2018-08-23 LAB — ANTINUCLEAR ANTIBODIES, IFA: ANA Ab, IFA: NEGATIVE

## 2018-08-24 LAB — COPPER, SERUM: Copper: 132 ug/dL (ref 72–166)

## 2018-08-26 LAB — MULTIPLE MYELOMA PANEL, SERUM
Albumin SerPl Elph-Mcnc: 3.4 g/dL (ref 2.9–4.4)
Albumin/Glob SerPl: 1.2 (ref 0.7–1.7)
Alpha 1: 0.2 g/dL (ref 0.0–0.4)
Alpha2 Glob SerPl Elph-Mcnc: 0.7 g/dL (ref 0.4–1.0)
B-Globulin SerPl Elph-Mcnc: 1.1 g/dL (ref 0.7–1.3)
Gamma Glob SerPl Elph-Mcnc: 0.8 g/dL (ref 0.4–1.8)
Globulin, Total: 2.9 g/dL (ref 2.2–3.9)
IgA: 139 mg/dL (ref 87–352)
IgG (Immunoglobin G), Serum: 889 mg/dL (ref 586–1602)
IgM (Immunoglobulin M), Srm: 91 mg/dL (ref 26–217)
Total Protein ELP: 6.3 g/dL (ref 6.0–8.5)

## 2018-09-03 ENCOUNTER — Telehealth: Payer: Self-pay | Admitting: Hematology

## 2018-09-03 NOTE — Telephone Encounter (Signed)
Confirmed appt and verified info. °

## 2018-09-04 ENCOUNTER — Inpatient Hospital Stay (HOSPITAL_BASED_OUTPATIENT_CLINIC_OR_DEPARTMENT_OTHER): Payer: Medicare Other | Admitting: Hematology

## 2018-09-04 DIAGNOSIS — I10 Essential (primary) hypertension: Secondary | ICD-10-CM | POA: Diagnosis not present

## 2018-09-04 DIAGNOSIS — E785 Hyperlipidemia, unspecified: Secondary | ICD-10-CM | POA: Diagnosis not present

## 2018-09-04 DIAGNOSIS — F419 Anxiety disorder, unspecified: Secondary | ICD-10-CM | POA: Diagnosis not present

## 2018-09-04 DIAGNOSIS — H9192 Unspecified hearing loss, left ear: Secondary | ICD-10-CM | POA: Diagnosis not present

## 2018-09-04 DIAGNOSIS — D709 Neutropenia, unspecified: Secondary | ICD-10-CM | POA: Diagnosis not present

## 2018-09-04 DIAGNOSIS — F329 Major depressive disorder, single episode, unspecified: Secondary | ICD-10-CM | POA: Diagnosis not present

## 2018-09-04 NOTE — Progress Notes (Signed)
HEMATOLOGY/ONCOLOGY CONSULTATION NOTE  Date of Service: 09/04/2018  Patient Care Team: Sharion Balloon, FNP as PCP - General (Family Medicine) Burnell Blanks, MD as PCP - Cardiology (Cardiology) Gala Romney Cristopher Estimable, MD as Consulting Physician (Gastroenterology)  CHIEF COMPLAINTS:  Neutropenia   HISTORY OF PRESENTING ILLNESS:  Leah [name] is a wonderful 68 y.o. female who has been referred to [place] by Dr. Evelina Dun for evaluation and management of neutropenia. The pt reports that she is doing well overall.  The pt reports that her neutropenia was found on routine blood tests and that it has been present for 10+ years. Her most recent labs in our system are from 2017.  The pt has had frequent ear infections over the past 2 months and has almost no hearing in her left ear at this time. She reports ear "popping," but no pain. She tried prednisone for 1 week, levofloxacin, and doxycyline, none of which helped her symptoms. She will be seeing an ENT in the near future.  She also woke up one morning and her mouth was raw. She was told that it was likely a reaction to levofloxacin and was put on Nystatin. This is resolved.  The pt has been feeling fatigued recently. She used to do all of her yard work but now had someone else do it. She has gained almost 20 lbs in the past month.  She reports a fever 2 weeks ago, but it went away with antibiotic use. Her BP medication changed from Lisinopril to Losartan about 6 months ago. She takes fish oil and Claritin. Her allergies have not been bad recently. She also had asthma, but had not had to use her Neubulizer in a long time.  She was diagnosed about 2 years ago with Alpha gal disease, so she does not eat beef or pork. She was experiencing body aches and could not sleep.  She has arthritis, which has gotten worse overtime. It was diagnosed 10 years ago. She thinks it is degenerative, but is not sure.  Most recent lab results  (07/29/2018) of CBC is as follows: all values are WNL except for WBC at 1.9k, Neutrophils abs at 0.8k, lymphocytes abs at 0.6k.  On review of systems, pt reports ear pain, fatigue, loss of hearing in left ear, fever 2 wks ago, and denies bad infections in the past year and any other symptoms.   On PMHx the pt reports esophageal reflux, mild asthma, alpha gal disease, degenerative ? arthritis in her hands On Social Hx the pt denies chemical exposure, drinks wine 2x/week, and quit smoking 20+ years ago On Family Hx the pt reports that her mother passed away from MM at 75 y.o. and also had arthritis and dementia   INTERVAL HISTORY:  Leah [name] is a 68 y.o. female here for evaluation and management of her neutropenia. The patient's last visit with [place] was on 08/22/2018. The pt reports that she is doing well overall.  Lab results today (08/22/18) of CBC w/diff and CMP is as follows: all values are WNL except for WBC at 3.4K, and neutro abs at 1.5K. 08/22/2018 MMP with all results WNL 08/22/2018 Kappa/Lambda light chains with a L:D ratio at 1.40 08/22/2018 Sed Rate WNL at 17 08/22/2018 LDH WNL at 176   On review of systems, pt denies other symptoms.    MEDICAL HISTORY:  Past Medical History:  Diagnosis Date  . Allergy   . Allergy to alpha-gal   . Anxiety   .  Arthritis   . Asthma   . CAD (coronary artery disease)   . Colon polyp   . Depression   . Food allergy   . GERD (gastroesophageal reflux disease)   . Hyperlipidemia   . Hypertension   . Urticaria     SURGICAL HISTORY: Past Surgical History:  Procedure Laterality Date  . ABDOMINAL HYSTERECTOMY    . Carpel Tunnel    . CHOLECYSTECTOMY    . COLONOSCOPY  02/08/2005   TSV:[id number] hemorrhoids otherwise normal rectum/ A few scattered, shallow, left-sided sigmoid diverticula/The remainder of the colonic mucosa appeared normal  . COLONOSCOPY WITH ESOPHAGOGASTRODUODENOSCOPY (EGD) N/A 11/29/2012   Procedure: COLONOSCOPY WITH  ESOPHAGOGASTRODUODENOSCOPY (EGD);  Surgeon: Daneil Dolin, MD;  Location: [facility];  Service: Endoscopy;  Laterality: N/A;  10:45  . CYST REMOVAL TRUNK    . HERNIA REPAIR     ventral X 2 with mesh, last one 2011 ([place]). complicated with 2 week hospital stay  . HERNIA REPAIR     with mesh  . NISSEN FUNDOPLICATION  0923  . POLYPECTOMY    . TUBAL LIGATION      SOCIAL HISTORY: Social History   Socioeconomic History  . Marital status: Divorced    Spouse name: Not on file  . Number of children: 3  . Years of education: Not on file  . Highest education level: Not on file  Occupational History  . Occupation: private duty Manufacturing systems engineer  Social Needs  . Financial resource strain: Not on file  . Food insecurity    Worry: Not on file    Inability: Not on file  . Transportation needs    Medical: Not on file    Non-medical: Not on file  Tobacco Use  . Smoking status: Former Smoker    Packs/day: 0.50    Years: 28.00    Pack years: 14.00    Types: Cigarettes    Quit date: 01/17/1996    Years since quitting: 22.6  . Smokeless tobacco: Never Used  Substance and Sexual Activity  . Alcohol use: Yes    Alcohol/week: 0.0 standard drinks    Comment: occasional use of wine   . Drug use: No  . Sexual activity: Yes    Partners: Male  Lifestyle  . Physical activity    Days per week: Not on file    Minutes per session: Not on file  . Stress: Not on file  Relationships  . Social Herbalist on phone: Not on file    Gets together: Not on file    Attends religious service: Not on file    Active member of club or organization: Not on file    Attends meetings of clubs or organizations: Not on file    Relationship status: Not on file  . Intimate partner violence    Fear of current or ex partner: Not on file    Emotionally abused: Not on file    Physically abused: Not on file    Forced sexual activity: Not on file  Other Topics Concern  . Not on file  Social  History Narrative  . Not on file    FAMILY HISTORY: Family History  Problem Relation Age of Onset  . Heart disease Mother   . Alzheimer's disease Mother   . Diabetes Mother   . Other Father        deceased age 104 from some sort of GI problems but no malignancy  . Diabetes Sister   .  Asthma Sister   . Colon polyps Sister   . Diabetes Brother   . Cancer Cousin        pancreatic  . Myasthenia gravis Daughter   . Diabetes Daughter   . Colon cancer Neg Hx   . Stomach cancer Neg Hx   . Esophageal cancer Neg Hx   . Rectal cancer Neg Hx     ALLERGIES:  is allergic to codeine; levaquin [levofloxacin]; meat [alpha-gal]; penicillins; praluent [alirocumab]; and strawberry (diagnostic).  MEDICATIONS:  Current Outpatient Medications  Medication Sig Dispense Refill  . albuterol (PROVENTIL) (2.5 MG/3ML) 0.083% nebulizer solution Take 3 mLs (2.5 mg total) by nebulization every 6 (six) hours as needed for wheezing or shortness of breath. 150 mL 6  . aspirin 81 MG tablet Take 81 mg by mouth daily.    . calcium carbonate (TUMS) 500 MG chewable tablet Chew 1 tablet (200 mg of elemental calcium total) by mouth as needed for indigestion or heartburn.    . Cholecalciferol (VITAMIN D) 2000 UNITS tablet Take 2,000 Units by mouth daily.    . ciprofloxacin-dexamethasone (CIPRODEX) OTIC suspension Place 4 drops into both ears 2 (two) times daily. 7.5 mL 0  . EPINEPHrine (EPIPEN 2-PAK) 0.3 mg/0.3 mL IJ SOAJ injection Inject into the muscle once.    . famotidine (PEPCID) 40 MG tablet Take 1 tablet (40 mg total) by mouth daily. 30 tablet 5  . fish oil-omega-3 fatty acids 1000 MG capsule Take 2 g by mouth daily.     . fluticasone (FLONASE) 50 MCG/ACT nasal spray Place 2 sprays into both nostrils daily. 16 g 6  . Fluticasone Furoate (ARNUITY ELLIPTA) 100 MCG/ACT AEPB Inhale 1 Dose into the lungs daily. 30 each 5  . loratadine (CLARITIN) 10 MG tablet Take 10 mg by mouth 2 (two) times daily.    Marland Kitchen  losartan-hydrochlorothiazide (HYZAAR) 100-12.5 MG tablet Take 1 tablet by mouth daily. 30 tablet 2  . montelukast (SINGULAIR) 10 MG tablet TAKE ONE TABLET AT BEDTIME 30 tablet 3  . nystatin (MYCOSTATIN) 100000 UNIT/ML suspension Take 5 mLs (500,000 Units total) by mouth 4 (four) times daily. 473 mL 1  . omeprazole (PRILOSEC) 40 MG capsule Take 1 capsule (40 mg total) by mouth every morning. (Patient taking differently: Take 40 mg by mouth daily. ) 30 capsule 5  . PROAIR HFA 108 (90 Base) MCG/ACT inhaler 2 PUFFS EVERY 6 HOURS AS NEEDED FOR WHEEZING OR SHORTNESS OF BREATH 8.5 g 0  . rosuvastatin (CRESTOR) 5 MG tablet Take 1 tablet (5 mg total) by mouth daily. 90 tablet 1   No current facility-administered medications for this visit.     REVIEW OF SYSTEMS:   A 10+ POINT REVIEW OF SYSTEMS WAS OBTAINED including neurology, dermatology, psychiatry, cardiac, respiratory, lymph, extremities, GI, GU, Musculoskeletal, constitutional, breasts, reproductive, HEENT.  All pertinent positives are noted in the HPI.  All others are negative.    PHYSICAL EXAMINATION: ECOG PERFORMANCE STATUS: 1 - Symptomatic but completely ambulatory  . There were no vitals filed for this visit. There were no vitals filed for this visit. .There is no height or weight on file to calculate BMI.  Telehealth Visit   LABORATORY DATA:  I have reviewed the data as listed  . CBC Latest Ref Rng & Units 08/22/2018 07/29/2018 01/30/2018  WBC 4.0 - 10.5 K/uL 3.4(L) 1.9(LL) 2.7(L)  Hemoglobin 12.0 - 15.0 g/dL 12.5 13.7 11.8  Hematocrit 36.0 - 46.0 % 38.1 40.7 34.7  Platelets 150 - 400 K/uL 267  189 288   ANC 800 __>1500 . CMP Latest Ref Rng & Units 08/22/2018 07/29/2018 01/30/2018  Glucose 70 - 99 mg/dL 99 110(H) 82  BUN 8 - 23 mg/dL 14 15 17   Creatinine 0.44 - 1.00 mg/dL 0.91 1.46(H) 0.92  Sodium 135 - 145 mmol/L 141 138 140  Potassium 3.5 - 5.1 mmol/L 4.0 4.8 4.0  Chloride 98 - 111 mmol/L 106 99 101  CO2 22 - 32 mmol/L 25 22 27    Calcium 8.9 - 10.3 mg/dL 9.5 9.0 9.0  Total Protein 6.5 - 8.1 g/dL 7.0 6.5 5.8(L)  Total Bilirubin 0.3 - 1.2 mg/dL 0.4 0.3 0.2  Alkaline Phos 38 - 126 U/L 86 80 79  AST 15 - 41 U/L 20 26 17   ALT 0 - 44 U/L 26 30 17      RADIOGRAPHIC STUDIES: I have personally reviewed the radiological images as listed and agreed with the findings in the report. Mm 3d Screen Breast Bilateral  Result Date: 08/16/2018 CLINICAL DATA:  Screening. EXAM: DIGITAL SCREENING BILATERAL MAMMOGRAM WITH TOMO AND CAD COMPARISON:  Previous exam(s). ACR Breast Density Category b: There are scattered areas of fibroglandular density. FINDINGS: There are no findings suspicious for malignancy. Images were processed with CAD. IMPRESSION: No mammographic evidence of malignancy. A result letter of this screening mammogram will be mailed directly to the patient. RECOMMENDATION: Screening mammogram in one year. (Code:SM-B-01Y) BI-RADS CATEGORY  1: Negative. Electronically Signed   By: Ammie Ferrier M.D.   On: 08/16/2018 16:07    ASSESSMENT & PLAN:   #1 Neutropenia - now reoslved -blood tests today -likely benign ethnic neutropenia -recent ear infections -- scheduled to see an ENT soon -mouth infection in the past 2 months -recent infeection, antibiotic and prednisone use- likely causes  PLAN: -Discussed pt labwork today, 09/04/18; all values are WNL except for WBC at 3.4K, and neutro abs at 1.5K. 08/22/2018 MMP with all results WNL 08/22/2018 Kappa/Lambda light chains with a L:D ratio at 1.40 08/22/2018 Sed Rate WNL at 17 08/22/2018 LDH WNL at 176 -Discussed 08/22/2018 MMP with all results WNL -Discussed benign ethnic neutropenia -Recommended taking vitamin B complex -Not recommending a follow up with our office at this time  FOLLOW UP: No follow up recommended at this time. RTC   The total time spent in the appt was 15 minutes and more than 50% was on counseling and direct patient cares.  All of the patient's  questions were answered with apparent satisfaction. The patient knows to call the clinic with any problems, questions or concerns.    Sullivan Lone MD MS AAHIVMS [facility] Hematology/Oncology Physician [facility]  (Office):       [phone] (Work cell):  [phone] (Fax):           [phone]  09/04/2018 7:53 AM  I, Jacqualyn Posey, am acting as a Education administrator for Dr. Sullivan Lone.   .I have reviewed the above documentation for accuracy and completeness, and I agree with the above. Brunetta Genera MD   ]

## 2018-09-05 ENCOUNTER — Telehealth: Payer: Self-pay | Admitting: Hematology

## 2018-09-05 NOTE — Telephone Encounter (Signed)
No los per 8/19. °

## 2018-09-13 DIAGNOSIS — H903 Sensorineural hearing loss, bilateral: Secondary | ICD-10-CM | POA: Diagnosis not present

## 2018-09-13 DIAGNOSIS — T161XXA Foreign body in right ear, initial encounter: Secondary | ICD-10-CM | POA: Diagnosis not present

## 2018-09-13 DIAGNOSIS — T162XXA Foreign body in left ear, initial encounter: Secondary | ICD-10-CM | POA: Diagnosis not present

## 2018-09-13 DIAGNOSIS — H838X3 Other specified diseases of inner ear, bilateral: Secondary | ICD-10-CM | POA: Diagnosis not present

## 2018-09-24 DIAGNOSIS — H2513 Age-related nuclear cataract, bilateral: Secondary | ICD-10-CM | POA: Diagnosis not present

## 2018-09-24 DIAGNOSIS — H524 Presbyopia: Secondary | ICD-10-CM | POA: Diagnosis not present

## 2018-09-24 DIAGNOSIS — H04123 Dry eye syndrome of bilateral lacrimal glands: Secondary | ICD-10-CM | POA: Diagnosis not present

## 2018-09-25 ENCOUNTER — Other Ambulatory Visit: Payer: Self-pay | Admitting: Family Medicine

## 2018-10-18 ENCOUNTER — Ambulatory Visit: Payer: Medicare Other

## 2018-10-24 ENCOUNTER — Other Ambulatory Visit: Payer: Self-pay | Admitting: Allergy & Immunology

## 2018-10-25 ENCOUNTER — Ambulatory Visit: Payer: Medicare Other

## 2018-11-11 ENCOUNTER — Ambulatory Visit: Payer: Medicare Other

## 2018-12-10 ENCOUNTER — Other Ambulatory Visit: Payer: Self-pay

## 2018-12-10 ENCOUNTER — Ambulatory Visit: Payer: Medicare Other

## 2018-12-11 ENCOUNTER — Ambulatory Visit (INDEPENDENT_AMBULATORY_CARE_PROVIDER_SITE_OTHER): Payer: Medicare Other

## 2018-12-11 DIAGNOSIS — Z23 Encounter for immunization: Secondary | ICD-10-CM

## 2018-12-13 ENCOUNTER — Other Ambulatory Visit: Payer: Self-pay | Admitting: Allergy & Immunology

## 2018-12-17 ENCOUNTER — Other Ambulatory Visit: Payer: Self-pay

## 2018-12-18 ENCOUNTER — Ambulatory Visit (INDEPENDENT_AMBULATORY_CARE_PROVIDER_SITE_OTHER): Payer: Medicare Other | Admitting: Family Medicine

## 2018-12-18 ENCOUNTER — Encounter: Payer: Self-pay | Admitting: Family Medicine

## 2018-12-18 ENCOUNTER — Other Ambulatory Visit: Payer: Self-pay | Admitting: Allergy & Immunology

## 2018-12-18 VITALS — BP 124/70 | HR 92 | Temp 98.6°F | Ht 64.0 in | Wt 198.0 lb

## 2018-12-18 DIAGNOSIS — M25511 Pain in right shoulder: Secondary | ICD-10-CM | POA: Diagnosis not present

## 2018-12-18 DIAGNOSIS — M25512 Pain in left shoulder: Secondary | ICD-10-CM

## 2018-12-18 DIAGNOSIS — Z23 Encounter for immunization: Secondary | ICD-10-CM | POA: Diagnosis not present

## 2018-12-18 DIAGNOSIS — G8929 Other chronic pain: Secondary | ICD-10-CM

## 2018-12-18 MED ORDER — FAMOTIDINE 40 MG PO TABS: 40.0000 mg | ORAL_TABLET | Freq: Every day | ORAL | 0 refills | Status: DC

## 2018-12-18 NOTE — Progress Notes (Signed)
Subjective: CC: shoulder pain PCP: Sharion Balloon, FNP [id number] Leah [name] is a 68 y.o. female presenting to clinic today for:  1. Shoulder pain Patient reports bilateral shoulder pain that feels tooth ache in nature.  She notes that she has known degenerative changes within the left shoulder that has previously been evaluated.  Her orthopedist recommended rotator cuff repair.  She has a history of trauma on that side.  However, recently her right shoulder has been aching.  She notes that the right actually seems worse in the left as of late.  Pain seems to be especially noticeable at bedtime.  She is not able to roll over onto the right shoulder due to pain.  She denies any weakness, sensation changes, joint swelling or discoloration.  No preceding injury on the right.  She sometimes uses over-the-counter therapies including Biofreeze which does help some.  She has used Aleve but does not like to use oral medications because of fear that it may exacerbate other medical problems.   ROS: Per HPI  Allergies  Allergen Reactions  . Codeine Nausea And Vomiting  . Levaquin [Levofloxacin] Other (See Comments)    Achilles tendon pain  . Meat [Alpha-Gal]   . Penicillins Other (See Comments)    Does not know  . Praluent [Alirocumab]     Rash and myalgias  . Strawberry (Diagnostic)    Past Medical History:  Diagnosis Date  . Allergy   . Allergy to alpha-gal   . Anxiety   . Arthritis   . Asthma   . CAD (coronary artery disease)   . Colon polyp   . Depression   . Food allergy   . GERD (gastroesophageal reflux disease)   . Hyperlipidemia   . Hypertension   . Urticaria     Current Outpatient Medications:  .  albuterol (PROVENTIL) (2.5 MG/3ML) 0.083% nebulizer solution, Take 3 mLs (2.5 mg total) by nebulization every 6 (six) hours as needed for wheezing or shortness of breath., Disp: 150 mL, Rfl: 6 .  aspirin 81 MG tablet, Take 81 mg by mouth daily., Disp: , Rfl:  .  calcium  carbonate (TUMS) 500 MG chewable tablet, Chew 1 tablet (200 mg of elemental calcium total) by mouth as needed for indigestion or heartburn., Disp: , Rfl:  .  Cholecalciferol (VITAMIN D) 2000 UNITS tablet, Take 2,000 Units by mouth daily., Disp: , Rfl:  .  ciprofloxacin-dexamethasone (CIPRODEX) OTIC suspension, Place 4 drops into both ears 2 (two) times daily., Disp: 7.5 mL, Rfl: 0 .  EPINEPHrine (EPIPEN 2-PAK) 0.3 mg/0.3 mL IJ SOAJ injection, Inject into the muscle once., Disp: , Rfl:  .  famotidine (PEPCID) 40 MG tablet, TAKE 1 TABLET DAILY, Disp: 30 tablet, Rfl: 0 .  fish oil-omega-3 fatty acids 1000 MG capsule, Take 2 g by mouth daily. , Disp: , Rfl:  .  fluticasone (FLONASE) 50 MCG/ACT nasal spray, Place 2 sprays into both nostrils daily., Disp: 16 g, Rfl: 6 .  Fluticasone Furoate (ARNUITY ELLIPTA) 100 MCG/ACT AEPB, Inhale 1 Dose into the lungs daily., Disp: 30 each, Rfl: 5 .  loratadine (CLARITIN) 10 MG tablet, Take 10 mg by mouth 2 (two) times daily., Disp: , Rfl:  .  losartan-hydrochlorothiazide (HYZAAR) 100-12.5 MG tablet, TAKE 1 TABLET DAILY, Disp: 90 tablet, Rfl: 1 .  montelukast (SINGULAIR) 10 MG tablet, TAKE ONE TABLET AT BEDTIME, Disp: 30 tablet, Rfl: 3 .  nystatin (MYCOSTATIN) 100000 UNIT/ML suspension, Take 5 mLs (500,000 Units total) by mouth 4 (four)  times daily., Disp: 473 mL, Rfl: 1 .  omeprazole (PRILOSEC) 40 MG capsule, Take 1 capsule (40 mg total) by mouth every morning. (Patient taking differently: Take 40 mg by mouth daily. ), Disp: 30 capsule, Rfl: 5 .  PROAIR HFA 108 (90 Base) MCG/ACT inhaler, 2 PUFFS EVERY 6 HOURS AS NEEDED FOR WHEEZING OR SHORTNESS OF BREATH, Disp: 8.5 g, Rfl: 0 .  rosuvastatin (CRESTOR) 5 MG tablet, Take 1 tablet (5 mg total) by mouth daily., Disp: 90 tablet, Rfl: 1 Social History   Socioeconomic History  . Marital status: Divorced    Spouse name: Not on file  . Number of children: 3  . Years of education: Not on file  . Highest education level: Not  on file  Occupational History  . Occupation: private duty Manufacturing systems engineer  Social Needs  . Financial resource strain: Not on file  . Food insecurity    Worry: Not on file    Inability: Not on file  . Transportation needs    Medical: Not on file    Non-medical: Not on file  Tobacco Use  . Smoking status: Former Smoker    Packs/day: 0.50    Years: 28.00    Pack years: 14.00    Types: Cigarettes    Quit date: 01/17/1996    Years since quitting: 22.9  . Smokeless tobacco: Never Used  Substance and Sexual Activity  . Alcohol use: Yes    Alcohol/week: 0.0 standard drinks    Comment: occasional use of wine   . Drug use: No  . Sexual activity: Yes    Partners: Male  Lifestyle  . Physical activity    Days per week: Not on file    Minutes per session: Not on file  . Stress: Not on file  Relationships  . Social Herbalist on phone: Not on file    Gets together: Not on file    Attends religious service: Not on file    Active member of club or organization: Not on file    Attends meetings of clubs or organizations: Not on file    Relationship status: Not on file  . Intimate partner violence    Fear of current or ex partner: Not on file    Emotionally abused: Not on file    Physically abused: Not on file    Forced sexual activity: Not on file  Other Topics Concern  . Not on file  Social History Narrative  . Not on file   Family History  Problem Relation Age of Onset  . Heart disease Mother   . Alzheimer's disease Mother   . Diabetes Mother   . Other Father        deceased age 22 from some sort of GI problems but no malignancy  . Diabetes Sister   . Asthma Sister   . Colon polyps Sister   . Diabetes Brother   . Cancer Cousin        pancreatic  . Myasthenia gravis Daughter   . Diabetes Daughter   . Colon cancer Neg Hx   . Stomach cancer Neg Hx   . Esophageal cancer Neg Hx   . Rectal cancer Neg Hx     Objective: Office vital signs reviewed. BP 124/70    Pulse 92   Temp 98.6 F (37 C) (Temporal)   Ht 5\' 4"  (1.626 m)   Wt 198 lb (89.8 kg)   SpO2 98%   BMI 33.99 kg/m  Physical Examination:  General: Awake, alert, well nourished, No acute distress HEENT: Normal. MMM Extremities: warm, well perfused, No edema, cyanosis or clubbing; +2 pulses bilaterally MSK: normal gait and station  Left shoulder: She appears to have some components of adhesive capsulitis as both active and passive range of motion is a limited and reduced by about 15 to 20 degrees in abduction.  She has painful arc sign noted on the left  Right shoulder: Patient with full active range of motion but again has pain with ABduction.  She has limited internal rotation bilaterally.  There is point tenderness to the anterior aspect of the shoulder.  No palpable bony changes.  No visible deformities or joint swelling.  She has a positive empty can test.  Negative Hawkins.  Negative Neer's. Skin: dry; intact; no rashes or lesions Neuro: 5/5 upper extremity strength except with empty can testing.  Upper extremity light touch sensation grossly intact   Assessment/ Plan: 68 y.o. female   1. Chronic pain of both shoulders I have a high suspicion that she has a possible degenerative tear in the right shoulder versus impingement based on history.  I recommended that she most certainly seek reevaluation by her orthopedist.  May benefit from ultrasound of the shoulder to look at the integrity of the rotator cuff.  I did offer x-rays today but I think is unlikely be repeated at her orthopedist so for financial reasons we did not perform these today.  Because she is reluctant to take oral NSAIDs and has quite a bit of issues with GERD, I have recommended that she use topical Voltaren gel applied to the shoulders bilaterally 4 times daily if needed.  I given her home physical therapy exercises for presumed rotator cuff tear.  We discussed red flag signs and symptoms warranting further evaluation she  voiced good understanding.  She will set up an appointment with her Ortho doc   No orders of the defined types were placed in this encounter.  No orders of the defined types were placed in this encounter.    Janora Norlander, DO [place] [phone]

## 2018-12-18 NOTE — Addendum Note (Signed)
Addended byCarrolyn Leigh on: 12/18/2018 04:51 PM   Modules accepted: Orders

## 2018-12-18 NOTE — Patient Instructions (Signed)
Schedule an appointment with your orthopedist for your shoulders.  Ok to use biofreeze.  Consider Voltaren gel applied 4 times daily if needed as well.  This would take the place of Aleve.   Rotator Cuff Tear  A rotator cuff tear is a partial or complete tear of the cord-like bands (tendons) that connect muscle to bone in the rotator cuff. The rotator cuff is a group of muscles and tendons that surround the shoulder joint and keep the upper arm bone (humerus) in the shoulder socket. The tear can occur suddenly (acute tear) or can develop over a long period of time (chronic tear). What are the causes? Acute tears may be caused by:  A fall, especially on an outstretched arm.  Lifting very heavy objects with a jerking motion. Chronic tears may be caused by overuse of the muscles. This may happen in sports, physical work, or activities in which your arm repeatedly moves over your head. What increases the risk? This condition is more likely to occur in:  Athletes and workers who frequently use their shoulder or reach over their heads. This may include activities such as: ? Tennis. ? Baseball and softball. ? Swimming and rowing. ? Weightlifting. ? Architect work. ? Painting.  People who smoke.  Older people who have arthritis or poor blood supply. These can make the muscles and tendons weaker. What are the signs or symptoms? Symptoms of this condition depend on the type and severity of the injury:  An acute tear may include a sudden tearing feeling, followed by severe pain that goes from your upper shoulder, down your arm, and toward your elbow.  A chronic tear includes a gradual weakness and decreased shoulder motion as the pain gets worse. The pain is usually worse at night. Both types may have symptoms such as:  Pain that spreads (radiates) from the shoulder to the upper arm.  Swelling and tenderness in front of the shoulder.  Decreased range of motion.  Pain when: ?  Reaching, pulling, or lifting the arm above the head. ? Lowering the arm from above the head.  Not being able to raise your arm out to the side.  Difficulty placing the arm behind your back. How is this diagnosed? This condition is diagnosed with a medical history and physical exam. Imaging tests may also be done, including:  X-rays.  MRI.  Ultrasound.  CT or MR arthrogram. During this test, a contrast material is injected into your shoulder and then images are taken. How is this treated? Treatment for this condition depends on the type and severity of the condition. In less severe cases, treatment may include:  Rest. This may be done with a sling that holds the shoulder still (immobilization). Your health care provider may also recommend avoiding activities that involve lifting your arm over your head.  Icing the shoulder.  Anti-inflammatory medicines, such as aspirin or ibuprofen.  Strengthening and stretching exercises. Your health care provider may recommend specific exercises to improve your range of motion and strengthen your shoulder. In more severe cases, treatment may include:  Physical therapy.  Steroid injections.  Surgery. Follow these instructions at home: Managing pain, stiffness, and swelling  If directed, put ice on the injured area. ? If you have a removable sling, remove it as told by your health care provider. ? Put ice in a plastic bag. ? Place a towel between your skin and the bag. ? Leave the ice on for 20 minutes, 2-3 times a day.  Raise (  elevate) the injured area above the level of your heart while you are lying down.  Find a comfortable sleeping position or sleep on a recliner, if available.  Move your fingers often to avoid stiffness and to lessen swelling.  Once the swelling has gone down, your health care provider may direct you to apply heat to relax the muscles. Use the heat source that your health care provider recommends, such as a moist  heat pack or a heating pad. ? Place a towel between your skin and the heat source. ? Leave the heat on for 20-30 minutes. ? Remove the heat if your skin turns bright red. This is especially important if you are unable to feel pain, heat, or cold. You may have a greater risk of getting burned. If you have a sling:  Wear the sling as told by your health care provider. Remove it only as told by your health care provider.  Loosen the sling if your fingers tingle, become numb, or turn cold and blue.  Keep the sling clean.  If the sling is not waterproof: ? Do not let it get wet. ? Cover it with a watertight covering when you take a bath or a shower. Driving  Do not drive or use heavy machinery while taking prescription pain medicine.  Ask your health care provider when it is safe to drive if you have a sling on your arm. Activity  Rest your shoulder as told by your health care provider.  Return to your normal activities as told by your health care provider. Ask your health care provider what activities are safe for you.  Do any exercises or stretches as told by your health care provider. General instructions  Do not use any products that contain nicotine or tobacco, such as cigarettes and e-cigarettes. If you need help quitting, ask your health care provider.  Take over-the-counter and prescription medicines only as told by your health care provider.  Keep all follow-up visits as told by your health care provider. This is important. Contact a health care provider if:  Your pain gets worse.  You have new pain in your arm, hands, or fingers.  Medicine does not help your pain. Get help right away if:  Your arm, hand, or fingers are numb or tingling.  Your arm, hand, or fingers are swollen or painful or they turn white or blue.  Your hand or fingers on your injured arm are colder than your other hand. Summary  A rotator cuff tear is a partial or complete tear of the cord-like  bands (tendons) that connect muscle to bone in the rotator cuff.  The tear can occur suddenly (acute tear) or can develop over a long period of time (chronic tear).  Treatment generally includes rest, anti-inflammatory medicines, and icing. In some cases, physical therapy and steroid injections may be needed. In severe cases, surgery may be needed. This information is not intended to replace advice given to you by your health care provider. Make sure you discuss any questions you have with your health care provider. Document Released: 12/31/1999 Document Revised: 12/15/2016 Document Reviewed: 03/20/2016 Elsevier Patient Education  2020 [employer].

## 2018-12-25 DIAGNOSIS — M25512 Pain in left shoulder: Secondary | ICD-10-CM | POA: Diagnosis not present

## 2018-12-25 DIAGNOSIS — M25511 Pain in right shoulder: Secondary | ICD-10-CM | POA: Diagnosis not present

## 2018-12-25 DIAGNOSIS — M7542 Impingement syndrome of left shoulder: Secondary | ICD-10-CM | POA: Diagnosis not present

## 2019-02-07 ENCOUNTER — Telehealth: Payer: Self-pay | Admitting: Family Medicine

## 2019-02-07 NOTE — Telephone Encounter (Signed)
She may require longer monitoring but I do not think allergy is a contraindication to the vaccine, so yes, I'd try and get it if she can.

## 2019-02-07 NOTE — Telephone Encounter (Signed)
Patient aware.

## 2019-03-24 ENCOUNTER — Other Ambulatory Visit: Payer: Self-pay | Admitting: Family Medicine

## 2019-03-26 ENCOUNTER — Ambulatory Visit (INDEPENDENT_AMBULATORY_CARE_PROVIDER_SITE_OTHER): Payer: Medicare Other | Admitting: *Deleted

## 2019-03-26 DIAGNOSIS — Z0000 Encounter for general adult medical examination without abnormal findings: Secondary | ICD-10-CM | POA: Diagnosis not present

## 2019-03-26 NOTE — Progress Notes (Signed)
MEDICARE ANNUAL WELLNESS VISIT  03/26/2019  Telephone Visit Disclaimer This Medicare AWV was conducted by telephone due to national recommendations for restrictions regarding the COVID-19 Pandemic (e.g. social distancing).  I verified, using two identifiers, that I am speaking with Leah [name] or their authorized healthcare agent. I discussed the limitations, risks, security, and privacy concerns of performing an evaluation and management service by telephone and the potential availability of an in-person appointment in the future. The patient expressed understanding and agreed to proceed.   Subjective:  Leah [name] is a 69 y.o. female patient of Janora Norlander, DO who had a Medicare Annual Wellness Visit today via telephone. Kanecia is Retired and lives alone. she has 2 children. she reports that she is socially active and does interact with friends/family regularly. she is moderately physically active and enjoys gardening, working in the yard, doing word search puzzles, singing and watching movies.  Patient Care Team: Janora Norlander, DO as PCP - General (Family Medicine) Burnell Blanks, MD as PCP - Cardiology (Cardiology) Daneil Dolin, MD as Consulting Physician (Gastroenterology)  Advanced Directives 03/26/2019 08/08/2017 01/17/2017 11/29/2012  Does Patient Have a Medical Advance Directive? No No No Patient does not have advance directive;Patient would not like information  Does patient want to make changes to medical advance directive? - Yes (MAU/Ambulatory/Procedural Areas - Information given) - -  Would patient like information on creating a medical advance directive? No - Patient declined - No - Patient declined -  Pre-existing out of facility DNR order (yellow form or pink MOST form) - - - No    Hospital Utilization Over the Past 12 Months: # of hospitalizations or ER visits: 0 # of surgeries: 0  Review of Systems    Patient reports that her  overall health is unchanged compared to last year.  History obtained from chart review  Patient Reported Readings (BP, Pulse, CBG, Weight, etc) none  Pain Assessment Pain : 0-10 Pain Score: 7  Pain Type: Other (Comment)(left rotator cuff-has surgery scheduled) Pain Location: Shoulder Pain Orientation: Left Pain Descriptors / Indicators: Aching, Constant, Restless, Sharp Pain Onset: More than a month ago Pain Frequency: Constant Pain Relieving Factors: Biofreeze Effect of Pain on Daily Activities: mild  Pain Relieving Factors: Biofreeze  Current Medications & Allergies (verified) Allergies as of 03/26/2019      Reactions   Codeine Nausea And Vomiting   Levaquin [levofloxacin] Other (See Comments)   Achilles tendon pain   Meat [alpha-gal]    Penicillins Other (See Comments)   Does not know   Praluent [alirocumab]    Rash and myalgias   Strawberry (diagnostic)       Medication List       Accurate as of March 26, 2019  9:53 AM. If you have any questions, ask your nurse or doctor.        albuterol (2.5 MG/3ML) 0.083% nebulizer solution Commonly known as: PROVENTIL Take 3 mLs (2.5 mg total) by nebulization every 6 (six) hours as needed for wheezing or shortness of breath.   ProAir HFA 108 (90 Base) MCG/ACT inhaler Generic drug: albuterol 2 PUFFS EVERY 6 HOURS AS NEEDED FOR WHEEZING OR SHORTNESS OF BREATH   Albuterol Sulfate 2.5 MG/0.5ML Nebu SMARTSIG:1 Vial(s) Via Nebulizer Every 6 Hours PRN   aspirin 81 MG tablet Take 81 mg by mouth daily.   calcium carbonate 500 MG chewable tablet Commonly known as: Tums Chew 1 tablet (200 mg of elemental calcium total) by  mouth as needed for indigestion or heartburn.   EMERGEN-C IMMUNE PO Take by mouth.   EpiPen 2-Pak 0.3 mg/0.3 mL Soaj injection Generic drug: EPINEPHrine Inject into the muscle once.   famotidine 40 MG tablet Commonly known as: PEPCID Take 1 tablet (40 mg total) by mouth daily.   fish oil-omega-3  fatty acids 1000 MG capsule Take 2 g by mouth daily.   fluticasone 50 MCG/ACT nasal spray Commonly known as: FLONASE Place 2 sprays into both nostrils daily.   loratadine 10 MG tablet Commonly known as: CLARITIN Take 10 mg by mouth 2 (two) times daily.   losartan-hydrochlorothiazide 100-12.5 MG tablet Commonly known as: HYZAAR TAKE 1 TABLET DAILY   Red Yeast Rice 600 MG Caps red yeast rice   Vitamin D 50 MCG (2000 UT) tablet Take 2,000 Units by mouth daily.       History (reviewed): Past Medical History:  Diagnosis Date  . Allergy   . Allergy to alpha-gal   . Anxiety   . Arthritis   . Asthma   . CAD (coronary artery disease)   . Colon polyp   . Depression   . Food allergy   . GERD (gastroesophageal reflux disease)   . Hyperlipidemia   . Hypertension   . Urticaria    Past Surgical History:  Procedure Laterality Date  . ABDOMINAL HYSTERECTOMY    . Carpel Tunnel    . CHOLECYSTECTOMY    . COLONOSCOPY  02/08/2005   [id number] hemorrhoids otherwise normal rectum/ A few scattered, shallow, left-sided sigmoid diverticula/The remainder of the colonic mucosa appeared normal  . COLONOSCOPY WITH ESOPHAGOGASTRODUODENOSCOPY (EGD) N/A 11/29/2012   Procedure: COLONOSCOPY WITH ESOPHAGOGASTRODUODENOSCOPY (EGD);  Surgeon: Daneil Dolin, MD;  Location: [facility];  Service: Endoscopy;  Laterality: N/A;  10:45  . CYST REMOVAL TRUNK    . HERNIA REPAIR     ventral X 2 with mesh, last one 2011 ([place]). complicated with 2 week hospital stay  . HERNIA REPAIR     with mesh  . NISSEN FUNDOPLICATION  0000000  . POLYPECTOMY    . TUBAL LIGATION     Family History  Problem Relation Age of Onset  . Heart disease Mother   . Alzheimer's disease Mother   . Diabetes Mother   . Other Father        deceased age 49 from some sort of GI problems but no malignancy  . Diabetes Sister   . Asthma Sister   . Colon polyps Sister   . Diabetes Brother   . Cancer Cousin         pancreatic  . Myasthenia gravis Daughter   . Diabetes Daughter   . Colon cancer Neg Hx   . Stomach cancer Neg Hx   . Esophageal cancer Neg Hx   . Rectal cancer Neg Hx    Social History   Socioeconomic History  . Marital status: Divorced    Spouse name: Not on file  . Number of children: 3  . Years of education: 12+  . Highest education level: Some college, no degree  Occupational History  . Occupation: private duty Manufacturing systems engineer  Tobacco Use  . Smoking status: Former Smoker    Packs/day: 0.50    Years: 28.00    Pack years: 14.00    Types: Cigarettes    Quit date: 01/17/1996    Years since quitting: 23.2  . Smokeless tobacco: Never Used  Substance and Sexual Activity  . Alcohol use: Yes  Alcohol/week: 2.0 standard drinks    Types: 2 Glasses of wine per week    Comment: occasional use of wine   . Drug use: No  . Sexual activity: Not Currently    Partners: Male    Birth control/protection: Surgical  Other Topics Concern  . Not on file  Social History Narrative  . Not on file   Social Determinants of Health   Financial Resource Strain: Low Risk   . Difficulty of Paying Living Expenses: Not hard at all  Food Insecurity: No Food Insecurity  . Worried About Charity fundraiser in the Last Year: Never true  . Ran Out of Food in the Last Year: Never true  Transportation Needs: No Transportation Needs  . Lack of Transportation (Medical): No  . Lack of Transportation (Non-Medical): No  Physical Activity: Insufficiently Active  . Days of Exercise per Week: 7 days  . Minutes of Exercise per Session: 20 min  Stress: Stress Concern Present  . Feeling of Stress : To some extent  Social Connections: Slightly Isolated  . Frequency of Communication with Friends and Family: More than three times a week  . Frequency of Social Gatherings with Friends and Family: More than three times a week  . Attends Religious Services: More than 4 times per year  . Active Member of Clubs or  Organizations: Yes  . Attends Archivist Meetings: More than 4 times per year  . Marital Status: Divorced    Activities of Daily Living In your present state of health, do you have any difficulty performing the following activities: 03/26/2019  Hearing? N  Vision? N  Comment wears glasses-gets yearly eye exams  Difficulty concentrating or making decisions? N  Walking or climbing stairs? N  Dressing or bathing? N  Doing errands, shopping? N  Preparing Food and eating ? N  Using the Toilet? N  Do you have problems with loss of bowel control? N  Managing your Medications? N  Managing your Finances? N  Housekeeping or managing your Housekeeping? N  Some recent data might be hidden    Patient Education/ Literacy How often do you need to have someone help you when you read instructions, pamphlets, or other written materials from your doctor or pharmacy?: 1 - Never What is the last grade level you completed in school?: Some College-No Degree  Exercise Current Exercise Habits: Home exercise routine, Type of exercise: walking, Time (Minutes): 20, Frequency (Times/Week): 7, Weekly Exercise (Minutes/Week): 140, Intensity: Mild, Exercise limited by: respiratory conditions(s)  Diet Patient reports consuming 2 meals a day and 1 snack(s) a day Patient reports that her primary diet is: Regular Patient reports that she does have regular access to food.   Depression Screen PHQ 2/9 Scores 03/26/2019 12/18/2018 08/08/2017 07/11/2017 06/01/2017 04/05/2017 03/23/2017  PHQ - 2 Score 0 0 0 0 0 0 0  PHQ- 9 Score 2 0 - - - - -     Fall Risk Fall Risk  03/26/2019 12/18/2018 01/30/2018 08/08/2017 07/11/2017  Falls in the past year? 0 0 0 Yes No  Number falls in past yr: - - - 2 or more -  Injury with Fall? - - - No -     Objective:  Leah [name] seemed alert and oriented and she participated appropriately during our telephone visit.  Blood Pressure Weight BMI  BP Readings from Last 3  Encounters:  12/18/18 124/70  08/22/18 (!) 152/92  07/29/18 115/75   Wt Readings from Last 3  Encounters:  12/18/18 198 lb (89.8 kg)  08/22/18 198 lb 9.6 oz (90.1 kg)  07/29/18 191 lb 6.4 oz (86.8 kg)   BMI Readings from Last 1 Encounters:  12/18/18 33.99 kg/m    *Unable to obtain current vital signs, weight, and BMI due to telephone visit type  Hearing/Vision  . Janisa did not seem to have difficulty with hearing/understanding during the telephone conversation . Reports that she has had a formal eye exam by an eye care professional within the past year . Reports that she has not had a formal hearing evaluation within the past year *Unable to fully assess hearing and vision during telephone visit type  Cognitive Function: 6CIT Screen 03/26/2019  What Year? 0 points  What month? 0 points  What time? 0 points  Count back from 20 0 points  Months in reverse 0 points  Repeat phrase 0 points  Total Score 0   (Normal:0-7, Significant for Dysfunction: >8)  Normal Cognitive Function Screening: Yes   Immunization & Health Maintenance Record Immunization History  Administered Date(s) Administered  . Fluad Quad(high Dose 65+) 12/11/2018  . Influenza,inj,Quad PF,6+ Mos 10/08/2012, 11/26/2013, 10/22/2014, 10/27/2015, 10/18/2016  . Influenza,inj,quad, With Preservative 12/11/2018  . Influenza-Unspecified 11/10/2016  . Pneumococcal Conjugate-13 12/18/2018  . Pneumococcal Polysaccharide-23 10/17/2011  . Td 02/16/2016  . Tdap 02/16/2016    Health Maintenance  Topic Date Due  . COLON CANCER SCREENING ANNUAL FOBT  02/26/2019  . PNA vac Low Risk Adult (2 of 2 - PPSV23) 12/18/2019  . MAMMOGRAM  08/15/2020  . DEXA SCAN  06/14/2022  . COLONOSCOPY  10/10/2022  . TETANUS/TDAP  02/15/2026  . INFLUENZA VACCINE  Completed  . Hepatitis C Screening  Completed       Assessment  This is a routine wellness examination for MARIONETTE MISA.  Health Maintenance: Due or Overdue Health  Maintenance Due  Topic Date Due  . COLON CANCER SCREENING ANNUAL FOBT  02/26/2019    Leah [name] does not need a referral for Community Assistance: Care Management:   no Social Work:    no Prescription Assistance:  no Nutrition/Diabetes Education:  no   Plan:  Personalized Goals Goals Addressed            This Visit's Progress   . DIET - INCREASE WATER INTAKE       Try to drink 6-8 glasses of water daily      Personalized Health Maintenance & Screening Recommendations  FOBT Shingles vaccine  Lung Cancer Screening Recommended: no (Low Dose CT Chest recommended if Age 8-80 years, 30 pack-year currently smoking OR have quit w/in past 15 years) Hepatitis C Screening recommended: no HIV Screening recommended: no  Advanced Directives: Written information was not prepared per patient's request.  Referrals & Orders No orders of the defined types were placed in this encounter.   Follow-up Plan . Follow-up with Janora Norlander, DO as planned . Consider Shingles vaccine at your next visit with your PCP . Make sure to get a FOBT at your next visit and return it for Colon Cancer Screening   I have personally reviewed and noted the following in the patient's chart:   . Medical and social history . Use of alcohol, tobacco or illicit drugs  . Current medications and supplements . Functional ability and status . Nutritional status . Physical activity . Advanced directives . List of other physicians . Hospitalizations, surgeries, and ER visits in previous 12 months . Vitals . Screenings to include cognitive,  depression, and falls . Referrals and appointments  In addition, I have reviewed and discussed with Leah [name] certain preventive protocols, quality metrics, and best practice recommendations. A written personalized care plan for preventive services as well as general preventive health recommendations is available and can be mailed to the patient at her  request.      Milas Hock, LPN  [id number]

## 2019-03-26 NOTE — Patient Instructions (Signed)
Preventive Care 38 Years and Older, Female Preventive care refers to lifestyle choices and visits with your health care provider that can promote health and wellness. This includes:  A yearly physical exam. This is also called an annual well check.  Regular dental and eye exams.  Immunizations.  Screening for certain conditions.  Healthy lifestyle choices, such as diet and exercise. What can I expect for my preventive care visit? Physical exam Your health care provider will check:  Height and weight. These may be used to calculate body mass index (BMI), which is a measurement that tells if you are at a healthy weight.  Heart rate and blood pressure.  Your skin for abnormal spots. Counseling Your health care provider may ask you questions about:  Alcohol, tobacco, and drug use.  Emotional well-being.  Home and relationship well-being.  Sexual activity.  Eating habits.  History of falls.  Memory and ability to understand (cognition).  Work and work Statistician.  Pregnancy and menstrual history. What immunizations do I need?  Influenza (flu) vaccine  This is recommended every year. Tetanus, diphtheria, and pertussis (Tdap) vaccine  You may need a Td booster every 10 years. Varicella (chickenpox) vaccine  You may need this vaccine if you have not already been vaccinated. Zoster (shingles) vaccine  You may need this after age 33. Pneumococcal conjugate (PCV13) vaccine  One dose is recommended after age 33. Pneumococcal polysaccharide (PPSV23) vaccine  One dose is recommended after age 72. Measles, mumps, and rubella (MMR) vaccine  You may need at least one dose of MMR if you were born in 1957 or later. You may also need a second dose. Meningococcal conjugate (MenACWY) vaccine  You may need this if you have certain conditions. Hepatitis A vaccine  You may need this if you have certain conditions or if you travel or work in places where you may be exposed  to hepatitis A. Hepatitis B vaccine  You may need this if you have certain conditions or if you travel or work in places where you may be exposed to hepatitis B. Haemophilus influenzae type b (Hib) vaccine  You may need this if you have certain conditions. You may receive vaccines as individual doses or as more than one vaccine together in one shot (combination vaccines). Talk with your health care provider about the risks and benefits of combination vaccines. What tests do I need? Blood tests  Lipid and cholesterol levels. These may be checked every 5 years, or more frequently depending on your overall health.  Hepatitis C test.  Hepatitis B test. Screening  Lung cancer screening. You may have this screening every year starting at age 39 if you have a 30-pack-year history of smoking and currently smoke or have quit within the past 15 years.  Colorectal cancer screening. All adults should have this screening starting at age 36 and continuing until age 15. Your health care provider may recommend screening at age 23 if you are at increased risk. You will have tests every 1-10 years, depending on your results and the type of screening test.  Diabetes screening. This is done by checking your blood sugar (glucose) after you have not eaten for a while (fasting). You may have this done every 1-3 years.  Mammogram. This may be done every 1-2 years. Talk with your health care provider about how often you should have regular mammograms.  BRCA-related cancer screening. This may be done if you have a family history of breast, ovarian, tubal, or peritoneal cancers.  Other tests  Sexually transmitted disease (STD) testing.  Bone density scan. This is done to screen for osteoporosis. You may have this done starting at age 44. Follow these instructions at home: Eating and drinking  Eat a diet that includes fresh fruits and vegetables, whole grains, lean protein, and low-fat dairy products. Limit  your intake of foods with high amounts of sugar, saturated fats, and salt.  Take vitamin and mineral supplements as recommended by your health care provider.  Do not drink alcohol if your health care provider tells you not to drink.  If you drink alcohol: ? Limit how much you have to 0-1 drink a day. ? Be aware of how much alcohol is in your drink. In the [place] one drink equals one 12 oz bottle of beer (355 mL), one 5 oz glass of wine (148 mL), or one 1 oz glass of hard liquor (44 mL). Lifestyle  Take daily care of your teeth and gums.  Stay active. Exercise for at least 30 minutes on 5 or more days each week.  Do not use any products that contain nicotine or tobacco, such as cigarettes, e-cigarettes, and chewing tobacco. If you need help quitting, ask your health care provider.  If you are sexually active, practice safe sex. Use a condom or other form of protection in order to prevent STIs (sexually transmitted infections).  Talk with your health care provider about taking a low-dose aspirin or statin. What's next?  Go to your health care provider once a year for a well check visit.  Ask your health care provider how often you should have your eyes and teeth checked.  Stay up to date on all vaccines. This information is not intended to replace advice given to you by your health care provider. Make sure you discuss any questions you have with your health care provider. Document Revised: 12/27/2017 Document Reviewed: 12/27/2017 Elsevier Patient Education  2020 [employer].

## 2019-03-27 ENCOUNTER — Other Ambulatory Visit: Payer: Self-pay

## 2019-03-28 ENCOUNTER — Encounter: Payer: Self-pay | Admitting: Family Medicine

## 2019-03-28 ENCOUNTER — Ambulatory Visit (INDEPENDENT_AMBULATORY_CARE_PROVIDER_SITE_OTHER): Payer: Medicare Other | Admitting: Family Medicine

## 2019-03-28 VITALS — BP 138/83 | HR 86 | Temp 96.6°F | Ht 64.0 in | Wt 200.6 lb

## 2019-03-28 DIAGNOSIS — E782 Mixed hyperlipidemia: Secondary | ICD-10-CM | POA: Diagnosis not present

## 2019-03-28 DIAGNOSIS — D709 Neutropenia, unspecified: Secondary | ICD-10-CM

## 2019-03-28 DIAGNOSIS — Z1211 Encounter for screening for malignant neoplasm of colon: Secondary | ICD-10-CM

## 2019-03-28 DIAGNOSIS — M2042 Other hammer toe(s) (acquired), left foot: Secondary | ICD-10-CM

## 2019-03-28 DIAGNOSIS — K219 Gastro-esophageal reflux disease without esophagitis: Secondary | ICD-10-CM | POA: Diagnosis not present

## 2019-03-28 DIAGNOSIS — I10 Essential (primary) hypertension: Secondary | ICD-10-CM | POA: Diagnosis not present

## 2019-03-28 DIAGNOSIS — E559 Vitamin D deficiency, unspecified: Secondary | ICD-10-CM

## 2019-03-28 DIAGNOSIS — B351 Tinea unguium: Secondary | ICD-10-CM

## 2019-03-28 MED ORDER — FAMOTIDINE 40 MG PO TABS: 40.0000 mg | ORAL_TABLET | Freq: Every day | ORAL | 3 refills | Status: DC

## 2019-03-28 MED ORDER — LOSARTAN POTASSIUM-HCTZ 100-12.5 MG PO TABS: 1.0000 | ORAL_TABLET | Freq: Every day | ORAL | 3 refills | Status: DC

## 2019-03-28 NOTE — Progress Notes (Signed)
Subjective: CC: f.u HTN PCP: Janora Norlander, DO [id number] Leah [name] is a 69 y.o. female presenting to clinic today for:  1.  Hypertension Patient reports compliance with losartan hydrochlorothiazide daily.  No chest pain, shortness of breath, visual disturbance, lower extremity edema.  2.  Foot concern Patient reports that she has been having what appears to be a hammertoe of the third digit on the left foot.  She points to the dorsum of the foot showing that there is a slight area of erythema.  She does note some discomfort surrounding this toe.  She has not seen a foot doctor but would be willing to see 1.  Does not report any sensory changes.  3.  GERD Patient reports good control of acid reflux symptoms with Pepcid.  Denies any nausea, vomiting, abdominal pain, unplanned weight loss, hematochezia or melena.  She is followed by Dr. Ardis Hughs for gastroenterology.   ROS: Per HPI  Allergies  Allergen Reactions  . Codeine Nausea And Vomiting  . Levaquin [Levofloxacin] Other (See Comments)    Achilles tendon pain  . Meat [Alpha-Gal]   . Penicillins Other (See Comments)    Does not know  . Praluent [Alirocumab]     Rash and myalgias  . Strawberry (Diagnostic)    Past Medical History:  Diagnosis Date  . Allergy   . Allergy to alpha-gal   . Anxiety   . Arthritis   . Asthma   . CAD (coronary artery disease)   . Colon polyp   . Depression   . Food allergy   . GERD (gastroesophageal reflux disease)   . Hyperlipidemia   . Hypertension   . Urticaria     Current Outpatient Medications:  .  albuterol (PROVENTIL) (2.5 MG/3ML) 0.083% nebulizer solution, Take 3 mLs (2.5 mg total) by nebulization every 6 (six) hours as needed for wheezing or shortness of breath., Disp: 150 mL, Rfl: 6 .  Albuterol Sulfate 2.5 MG/0.5ML NEBU, SMARTSIG:1 Vial(s) Via Nebulizer Every 6 Hours PRN, Disp: , Rfl:  .  aspirin 81 MG tablet, Take 81 mg by mouth daily., Disp: , Rfl:  .  calcium carbonate  (TUMS) 500 MG chewable tablet, Chew 1 tablet (200 mg of elemental calcium total) by mouth as needed for indigestion or heartburn., Disp: , Rfl:  .  Cholecalciferol (VITAMIN D) 2000 UNITS tablet, Take 2,000 Units by mouth daily., Disp: , Rfl:  .  EPINEPHrine (EPIPEN 2-PAK) 0.3 mg/0.3 mL IJ SOAJ injection, Inject into the muscle once., Disp: , Rfl:  .  famotidine (PEPCID) 40 MG tablet, Take 1 tablet (40 mg total) by mouth daily., Disp: 90 tablet, Rfl: 0 .  fish oil-omega-3 fatty acids 1000 MG capsule, Take 2 g by mouth daily. , Disp: , Rfl:  .  fluticasone (FLONASE) 50 MCG/ACT nasal spray, Place 2 sprays into both nostrils daily., Disp: 16 g, Rfl: 6 .  loratadine (CLARITIN) 10 MG tablet, Take 10 mg by mouth 2 (two) times daily., Disp: , Rfl:  .  losartan-hydrochlorothiazide (HYZAAR) 100-12.5 MG tablet, TAKE 1 TABLET DAILY, Disp: 90 tablet, Rfl: 1 .  Multiple Vitamins-Minerals (EMERGEN-C IMMUNE PO), Take by mouth., Disp: , Rfl:  .  PROAIR HFA 108 (90 Base) MCG/ACT inhaler, 2 PUFFS EVERY 6 HOURS AS NEEDED FOR WHEEZING OR SHORTNESS OF BREATH, Disp: 8.5 g, Rfl: 0 .  Red Yeast Rice 600 MG CAPS, red yeast rice, Disp: , Rfl:  Social History   Socioeconomic History  . Marital status: Divorced  Spouse name: Not on file  . Number of children: 3  . Years of education: 12+  . Highest education level: Some college, no degree  Occupational History  . Occupation: private duty Manufacturing systems engineer  Tobacco Use  . Smoking status: Former Smoker    Packs/day: 0.50    Years: 28.00    Pack years: 14.00    Types: Cigarettes    Quit date: 01/17/1996    Years since quitting: 23.2  . Smokeless tobacco: Never Used  Substance and Sexual Activity  . Alcohol use: Yes    Alcohol/week: 2.0 standard drinks    Types: 2 Glasses of wine per week    Comment: occasional use of wine   . Drug use: No  . Sexual activity: Not Currently    Partners: Male    Birth control/protection: Surgical  Other Topics Concern  . Not on  file  Social History Narrative  . Not on file   Social Determinants of Health   Financial Resource Strain: Low Risk   . Difficulty of Paying Living Expenses: Not hard at all  Food Insecurity: No Food Insecurity  . Worried About Charity fundraiser in the Last Year: Never true  . Ran Out of Food in the Last Year: Never true  Transportation Needs: No Transportation Needs  . Lack of Transportation (Medical): No  . Lack of Transportation (Non-Medical): No  Physical Activity: Insufficiently Active  . Days of Exercise per Week: 7 days  . Minutes of Exercise per Session: 20 min  Stress: Stress Concern Present  . Feeling of Stress : To some extent  Social Connections: Slightly Isolated  . Frequency of Communication with Friends and Family: More than three times a week  . Frequency of Social Gatherings with Friends and Family: More than three times a week  . Attends Religious Services: More than 4 times per year  . Active Member of Clubs or Organizations: Yes  . Attends Archivist Meetings: More than 4 times per year  . Marital Status: Divorced  Human resources officer Violence: Not At Risk  . Fear of Current or Ex-Partner: No  . Emotionally Abused: No  . Physically Abused: No  . Sexually Abused: No   Family History  Problem Relation Age of Onset  . Heart disease Mother   . Alzheimer's disease Mother   . Diabetes Mother   . Other Father        deceased age 54 from some sort of GI problems but no malignancy  . Diabetes Sister   . Asthma Sister   . Colon polyps Sister   . Diabetes Brother   . Cancer Cousin        pancreatic  . Myasthenia gravis Daughter   . Diabetes Daughter   . Colon cancer Neg Hx   . Stomach cancer Neg Hx   . Esophageal cancer Neg Hx   . Rectal cancer Neg Hx     Objective: Office vital signs reviewed. BP 138/83   Pulse 86   Temp (!) 96.6 F (35.9 C) (Temporal)   Ht '5\' 4"'$  (1.626 m)   Wt 200 lb 9.6 oz (91 kg)   SpO2 100%   BMI 34.43 kg/m    Physical Examination:  General: Awake, alert, well nourished, No acute distress HEENT: Normal, sclera white, MMM Cardio: regular rate and rhythm, S1S2 heard, no murmurs appreciated Pulm: clear to auscultation bilaterally, no wheezes, rhonchi or rales; normal work of breathing on room air Extremities: warm, well perfused,  No edema, cyanosis or clubbing; +2 pulses bilaterally MSK:  Left foot: Third digit of left foot with some hammer toe formation noted.  She has slight erythema over the PIP joint of this digit but there is no skin breakdown Skin: Onychomycotic changes noted to the toenails bilaterally  Assessment/ Plan: 69 y.o. female    1. Essential hypertension Controlled.  Continue current regimen.  Refill sent - Lipid panel; Future  2. Gastroesophageal reflux disease without esophagitis Stable with Pepcid.  Refill sent  3. Mixed hyperlipidemia She will come in for fasting labs - Lipid panel; Future - CMP14+EGFR; Future - TSH; Future  4. Vitamin D deficiency  5. Screen for colon cancer - Fecal occult blood, imunochemical; Future  6. Morbid obesity ([place]) - Lipid panel; Future - CMP14+EGFR; Future - TSH; Future - Bayer DCA Hb A1c Waived; Future  7. Neutropenia, unspecified type ([place]) - CBC; Future  8. Hammer toe of left foot Referral placed to podiatry in [place] - Ambulatory referral to Podiatry  9. Onychomycosis - Ambulatory referral to Podiatry    No orders of the defined types were placed in this encounter.  Meds ordered this encounter  Medications  . famotidine (PEPCID) 40 MG tablet    Sig: Take 1 tablet (40 mg total) by mouth daily.    Dispense:  90 tablet    Refill:  3  . losartan-hydrochlorothiazide (HYZAAR) 100-12.5 MG tablet    Sig: Take 1 tablet by mouth daily.    Dispense:  90 tablet    Refill:  [place], [place] [phone]

## 2019-03-28 NOTE — Patient Instructions (Signed)
Labs are in.  Come in fasting.  Bring back your stool sample.

## 2019-03-31 ENCOUNTER — Other Ambulatory Visit: Payer: Self-pay

## 2019-03-31 ENCOUNTER — Other Ambulatory Visit: Payer: Medicare Other

## 2019-03-31 DIAGNOSIS — Z1211 Encounter for screening for malignant neoplasm of colon: Secondary | ICD-10-CM

## 2019-03-31 DIAGNOSIS — E782 Mixed hyperlipidemia: Secondary | ICD-10-CM

## 2019-03-31 DIAGNOSIS — D709 Neutropenia, unspecified: Secondary | ICD-10-CM

## 2019-03-31 DIAGNOSIS — I10 Essential (primary) hypertension: Secondary | ICD-10-CM

## 2019-03-31 LAB — BAYER DCA HB A1C WAIVED: HB A1C (BAYER DCA - WAIVED): 5.9 % (ref <7.0)

## 2019-04-01 ENCOUNTER — Other Ambulatory Visit: Payer: Self-pay | Admitting: Family Medicine

## 2019-04-01 LAB — CMP14+EGFR
ALT: 23 IU/L (ref 0–32)
AST: 21 IU/L (ref 0–40)
Albumin/Globulin Ratio: 1.7 (ref 1.2–2.2)
Albumin: 3.8 g/dL (ref 3.8–4.8)
Alkaline Phosphatase: 93 IU/L (ref 39–117)
BUN/Creatinine Ratio: 21 (ref 12–28)
BUN: 20 mg/dL (ref 8–27)
Bilirubin Total: 0.3 mg/dL (ref 0.0–1.2)
CO2: 25 mmol/L (ref 20–29)
Calcium: 9.6 mg/dL (ref 8.7–10.3)
Chloride: 101 mmol/L (ref 96–106)
Creatinine, Ser: 0.94 mg/dL (ref 0.57–1.00)
GFR calc Af Amer: 73 mL/min/{1.73_m2} (ref >59)
GFR calc non Af Amer: 63 mL/min/{1.73_m2} (ref >59)
Globulin, Total: 2.2 g/dL (ref 1.5–4.5)
Glucose: 93 mg/dL (ref 65–99)
Potassium: 3.9 mmol/L (ref 3.5–5.2)
Sodium: 144 mmol/L (ref 134–144)
Total Protein: 6 g/dL (ref 6.0–8.5)

## 2019-04-01 LAB — CBC
Hematocrit: 38.4 % (ref 34.0–46.6)
Hemoglobin: 12.4 g/dL (ref 11.1–15.9)
MCH: 27.4 pg (ref 26.6–33.0)
MCHC: 32.3 g/dL (ref 31.5–35.7)
MCV: 85 fL (ref 79–97)
Platelets: 276 10*3/uL (ref 150–450)
RBC: 4.53 x10E6/uL (ref 3.77–5.28)
RDW: 14.2 % (ref 11.7–15.4)
WBC: 3.4 10*3/uL (ref 3.4–10.8)

## 2019-04-01 LAB — LIPID PANEL
Chol/HDL Ratio: 5.7 ratio — ABNORMAL HIGH (ref 0.0–4.4)
Cholesterol, Total: 264 mg/dL — ABNORMAL HIGH (ref 100–199)
HDL: 46 mg/dL (ref >39)
LDL Chol Calc (NIH): 195 mg/dL — ABNORMAL HIGH (ref 0–99)
Triglycerides: 126 mg/dL (ref 0–149)
VLDL Cholesterol Cal: 23 mg/dL (ref 5–40)

## 2019-04-01 LAB — TSH: TSH: 2.24 u[IU]/mL (ref 0.450–4.500)

## 2019-04-01 MED ORDER — ATORVASTATIN CALCIUM 20 MG PO TABS: 20.0000 mg | ORAL_TABLET | Freq: Every day | ORAL | 3 refills | Status: DC

## 2019-04-03 ENCOUNTER — Telehealth: Payer: Self-pay | Admitting: Family Medicine

## 2019-04-03 NOTE — Telephone Encounter (Signed)
Patient notified of lab results and verbalized understanding.  ° °

## 2019-04-04 HISTORY — PX: ROTATOR CUFF REPAIR: SHX139

## 2019-04-07 ENCOUNTER — Encounter: Payer: Self-pay | Admitting: Family Medicine

## 2019-04-07 ENCOUNTER — Telehealth (INDEPENDENT_AMBULATORY_CARE_PROVIDER_SITE_OTHER): Payer: Medicare Other | Admitting: Family Medicine

## 2019-04-07 DIAGNOSIS — K625 Hemorrhage of anus and rectum: Secondary | ICD-10-CM

## 2019-04-07 NOTE — Progress Notes (Signed)
Virtual Visit via MyChart Video Note Due to COVID-19 pandemic this visit was conducted virtually. This visit type was conducted due to national recommendations for restrictions regarding the COVID-19 Pandemic (e.g. social distancing, sheltering in place) in an effort to limit this patient's exposure and mitigate transmission in our community. All issues noted in this document were discussed and addressed.  A physical exam was not performed with this format.   I connected with Leah [name] on 04/07/2019 at 1000 by video and verified that I am speaking with the correct person using two identifiers. Leah [name] is currently located at home and no one is currently with them during visit. The provider, Monia Pouch, FNP is located in their office at time of visit.  I discussed the limitations, risks, security and privacy concerns of performing an evaluation and management service by video / telephone and the availability of in person appointments. I also discussed with the patient that there may be a patient responsible charge related to this service. The patient expressed understanding and agreed to proceed.  Subjective:  Patient ID: Leah [name], female    DOB: July 07, 1950, 69 y.o.   MRN: [id number]  Chief Complaint:  Blood in stool   HPI: Leah [name] is a 69 y.o. female presenting on 04/07/2019 for Blood in stool  Pt reports she has bright red bleeding from her rectum. States this started yesterday and is with every bowel movement. She states she was having a hard time having a bowel movement and took a laxative. States she has not felt any hemorrhoids externally. She denies weakness, shortness of breath, chest pain, dizziness, or palpitations.   Relevant past medical, surgical, family, and social history reviewed and updated as indicated.  Allergies and medications reviewed and updated.   Past Medical History:  Diagnosis Date  . Allergy   . Allergy to alpha-gal   . Anxiety    . Arthritis   . Asthma   . CAD (coronary artery disease)   . Colon polyp   . Depression   . Food allergy   . GERD (gastroesophageal reflux disease)   . Hyperlipidemia   . Hypertension   . Urticaria     Past Surgical History:  Procedure Laterality Date  . ABDOMINAL HYSTERECTOMY    . Carpel Tunnel    . CHOLECYSTECTOMY    . COLONOSCOPY  02/08/2005   [id number] hemorrhoids otherwise normal rectum/ A few scattered, shallow, left-sided sigmoid diverticula/The remainder of the colonic mucosa appeared normal  . COLONOSCOPY WITH ESOPHAGOGASTRODUODENOSCOPY (EGD) N/A 11/29/2012   Procedure: COLONOSCOPY WITH ESOPHAGOGASTRODUODENOSCOPY (EGD);  Surgeon: Daneil Dolin, MD;  Location: [facility];  Service: Endoscopy;  Laterality: N/A;  10:45  . CYST REMOVAL TRUNK    . HERNIA REPAIR     ventral X 2 with mesh, last one 2011 ([place]). complicated with 2 week hospital stay  . HERNIA REPAIR     with mesh  . NISSEN FUNDOPLICATION  0000000  . POLYPECTOMY    . TUBAL LIGATION      Social History   Socioeconomic History  . Marital status: Divorced    Spouse name: Not on file  . Number of children: 3  . Years of education: 12+  . Highest education level: Some college, no degree  Occupational History  . Occupation: private duty Manufacturing systems engineer  Tobacco Use  . Smoking status: Former Smoker    Packs/day: 0.50    Years: 28.00    Pack years: 14.00  Types: Cigarettes    Quit date: 01/17/1996    Years since quitting: 23.2  . Smokeless tobacco: Never Used  Substance and Sexual Activity  . Alcohol use: Yes    Alcohol/week: 2.0 standard drinks    Types: 2 Glasses of wine per week    Comment: occasional use of wine   . Drug use: No  . Sexual activity: Not Currently    Partners: Male    Birth control/protection: Surgical  Other Topics Concern  . Not on file  Social History Narrative  . Not on file   Social Determinants of Health   Financial Resource Strain: Low Risk   .  Difficulty of Paying Living Expenses: Not hard at all  Food Insecurity: No Food Insecurity  . Worried About Charity fundraiser in the Last Year: Never true  . Ran Out of Food in the Last Year: Never true  Transportation Needs: No Transportation Needs  . Lack of Transportation (Medical): No  . Lack of Transportation (Non-Medical): No  Physical Activity: Insufficiently Active  . Days of Exercise per Week: 7 days  . Minutes of Exercise per Session: 20 min  Stress: Stress Concern Present  . Feeling of Stress : To some extent  Social Connections: Slightly Isolated  . Frequency of Communication with Friends and Family: More than three times a week  . Frequency of Social Gatherings with Friends and Family: More than three times a week  . Attends Religious Services: More than 4 times per year  . Active Member of Clubs or Organizations: Yes  . Attends Archivist Meetings: More than 4 times per year  . Marital Status: Divorced  Human resources officer Violence: Not At Risk  . Fear of Current or Ex-Partner: No  . Emotionally Abused: No  . Physically Abused: No  . Sexually Abused: No    Outpatient Encounter Medications as of 04/07/2019  Medication Sig  . albuterol (PROVENTIL) (2.5 MG/3ML) 0.083% nebulizer solution Take 3 mLs (2.5 mg total) by nebulization every 6 (six) hours as needed for wheezing or shortness of breath.  . Albuterol Sulfate 2.5 MG/0.5ML NEBU SMARTSIG:1 Vial(s) Via Nebulizer Every 6 Hours PRN  . aspirin 81 MG tablet Take 81 mg by mouth daily.  Marland Kitchen atorvastatin (LIPITOR) 20 MG tablet Take 1 tablet (20 mg total) by mouth daily.  . calcium carbonate (TUMS) 500 MG chewable tablet Chew 1 tablet (200 mg of elemental calcium total) by mouth as needed for indigestion or heartburn.  . Cholecalciferol (VITAMIN D) 2000 UNITS tablet Take 2,000 Units by mouth daily.  Marland Kitchen EPINEPHrine (EPIPEN 2-PAK) 0.3 mg/0.3 mL IJ SOAJ injection Inject into the muscle once.  . famotidine (PEPCID) 40 MG  tablet Take 1 tablet (40 mg total) by mouth daily.  . fish oil-omega-3 fatty acids 1000 MG capsule Take 2 g by mouth daily.   . fluticasone (FLONASE) 50 MCG/ACT nasal spray Place 2 sprays into both nostrils daily.  Marland Kitchen loratadine (CLARITIN) 10 MG tablet Take 10 mg by mouth 2 (two) times daily.  Marland Kitchen losartan-hydrochlorothiazide (HYZAAR) 100-12.5 MG tablet Take 1 tablet by mouth daily.  . Multiple Vitamins-Minerals (EMERGEN-C IMMUNE PO) Take by mouth.  Marland Kitchen PROAIR HFA 108 (90 Base) MCG/ACT inhaler 2 PUFFS EVERY 6 HOURS AS NEEDED FOR WHEEZING OR SHORTNESS OF BREATH  . Red Yeast Rice 600 MG CAPS red yeast rice   No facility-administered encounter medications on file as of 04/07/2019.    Allergies  Allergen Reactions  . Codeine Nausea And Vomiting  .  Levaquin [Levofloxacin] Other (See Comments)    Achilles tendon pain  . Meat [Alpha-Gal]   . Penicillins Other (See Comments)    Does not know  . Praluent [Alirocumab]     Rash and myalgias  . Strawberry (Diagnostic)     Review of Systems  Constitutional: Negative for activity change, appetite change, chills, diaphoresis, fatigue, fever and unexpected weight change.  HENT: Negative.   Eyes: Negative.   Respiratory: Negative for cough, chest tightness and shortness of breath.   Cardiovascular: Negative for chest pain, palpitations and leg swelling.  Gastrointestinal: Positive for anal bleeding and blood in stool. Negative for abdominal distention, abdominal pain, constipation, diarrhea, nausea, rectal pain and vomiting.  Endocrine: Negative.   Genitourinary: Negative for decreased urine volume, difficulty urinating, dysuria, frequency and urgency.  Musculoskeletal: Negative for arthralgias and myalgias.  Skin: Negative.   Allergic/Immunologic: Negative.   Neurological: Negative for dizziness, tremors, seizures, syncope, facial asymmetry, speech difficulty, weakness, light-headedness, numbness and headaches.  Hematological: Negative.     Psychiatric/Behavioral: Negative for confusion, hallucinations, sleep disturbance and suicidal ideas.  All other systems reviewed and are negative.        Observations/Objective: No vital signs or physical exam, this was a telephone or virtual health encounter.  Pt alert and oriented, answers all questions appropriately, and able to speak in full sentences.  Pt did show provider stool via video. Bright red blood without clots in commode.   Assessment and Plan: Floe was seen today for blood in stool.  Diagnoses and all orders for this visit:  Bright red blood per rectum Will place urgent referral to GI. Pt aware of symptoms that require emergent evaluation in the ED. Pt aware to keeps stools soft with stool softeners and adequate hydration.  -     Ambulatory referral to Gastroenterology     Follow Up Instructions: Return if symptoms worsen or fail to improve.    I discussed the assessment and treatment plan with the patient. The patient was provided an opportunity to ask questions and all were answered. The patient agreed with the plan and demonstrated an understanding of the instructions.   The patient was advised to call back or seek an in-person evaluation if the symptoms worsen or if the condition fails to improve as anticipated.  The above assessment and management plan was discussed with the patient. The patient verbalized understanding of and has agreed to the management plan. Patient is aware to call the clinic if they develop any new symptoms or if symptoms persist or worsen. Patient is aware when to return to the clinic for a follow-up visit. Patient educated on when it is appropriate to go to the emergency department.    I provided 15 minutes of non-face-to-face time during this encounter. The video started at 1000. The video ended at 1015. The other time was used for coordination of care.    Monia Pouch, FNP-C [facility] [address] [place], [place] [address] [phone] 04/07/2019

## 2019-04-10 ENCOUNTER — Ambulatory Visit (INDEPENDENT_AMBULATORY_CARE_PROVIDER_SITE_OTHER): Payer: Medicare Other | Admitting: Gastroenterology

## 2019-04-10 ENCOUNTER — Encounter: Payer: Self-pay | Admitting: Gastroenterology

## 2019-04-10 VITALS — BP 122/82 | HR 73 | Temp 97.3°F | Ht 64.0 in | Wt 207.0 lb

## 2019-04-10 DIAGNOSIS — K648 Other hemorrhoids: Secondary | ICD-10-CM

## 2019-04-10 DIAGNOSIS — K5900 Constipation, unspecified: Secondary | ICD-10-CM | POA: Diagnosis not present

## 2019-04-10 DIAGNOSIS — K625 Hemorrhage of anus and rectum: Secondary | ICD-10-CM

## 2019-04-10 MED ORDER — HYDROCORTISONE (PERIANAL) 2.5 % EX CREA: 1.0000 "application " | TOPICAL_CREAM | Freq: Two times a day (BID) | CUTANEOUS | 0 refills | Status: DC

## 2019-04-10 NOTE — Progress Notes (Signed)
04/10/2019 Leah [name] [id number] 01/31/1950   HISTORY OF PRESENT ILLNESS: This is a pleasant 69 year old female who is a patient of Dr. Ardis Hughs.  She presents here today with complaints of rectal bleeding and constipation.  She had shoulder surgery last Friday, 6 days ago, and since then she has been dealing with constipation.  On Sunday she had a bowel movement that was hard to pass and she passed some bright red blood in the toilet bowl with that.  She showed me a picture,  it was bright red blood, fairly small volume.  Now she has not had a bowel movement since Sunday.  She has been using pain medication for her shoulder since Sunday as well.  Her abdomen feels very full and bloated since she has not been having regular bowel movements.  Her last colonoscopy was in September 2019 at which time she was found to have one to 2 mm polyp that was removed and diverticulosis.  This was a tubular adenoma on pathology.   Past Medical History:  Diagnosis Date  . Allergy   . Allergy to alpha-gal   . Anxiety   . Arthritis   . Asthma   . CAD (coronary artery disease)   . Colon polyp   . Depression   . Food allergy   . GERD (gastroesophageal reflux disease)   . Hyperlipidemia   . Hypertension   . Urticaria    Past Surgical History:  Procedure Laterality Date  . ABDOMINAL HYSTERECTOMY    . Carpel Tunnel    . CHOLECYSTECTOMY    . COLONOSCOPY  02/08/2005   [id number] hemorrhoids otherwise normal rectum/ A few scattered, shallow, left-sided sigmoid diverticula/The remainder of the colonic mucosa appeared normal  . COLONOSCOPY WITH ESOPHAGOGASTRODUODENOSCOPY (EGD) N/A 11/29/2012   Procedure: COLONOSCOPY WITH ESOPHAGOGASTRODUODENOSCOPY (EGD);  Surgeon: Daneil Dolin, MD;  [facility];  Service: Endoscopy;  Laterality: N/A;  10:45  . CYST REMOVAL TRUNK    . HERNIA REPAIR     ventral X 2 with mesh, last one 2011 ([place]). complicated with 2 week hospital stay  .  HERNIA REPAIR     with mesh  . NISSEN FUNDOPLICATION  0000000  . POLYPECTOMY    . ROTATOR CUFF REPAIR Left 04/04/2019  . TUBAL LIGATION      reports that she quit smoking about 23 years ago. Her smoking use included cigarettes. She has a 14.00 pack-year smoking history. She has never used smokeless tobacco. She reports current alcohol use of about 2.0 standard drinks of alcohol per week. She reports that she does not use drugs. family history includes Alzheimer's disease in her mother; Asthma in her sister; Cancer in her cousin; Colon polyps in her sister; Diabetes in her brother, daughter, mother, and sister; Heart disease in her mother; Myasthenia gravis in her daughter; Other in her father. Allergies  Allergen Reactions  . Codeine Nausea And Vomiting  . Levaquin [Levofloxacin] Other (See Comments)    Achilles tendon pain  . Meat [Alpha-Gal]   . Penicillins Other (See Comments)    Does not know  . Praluent [Alirocumab]     Rash and myalgias  . Strawberry (Diagnostic)       Outpatient Encounter Medications as of 04/10/2019  Medication Sig  . albuterol (PROVENTIL) (2.5 MG/3ML) 0.083% nebulizer solution Take 3 mLs (2.5 mg total) by nebulization every 6 (six) hours as needed for wheezing or shortness of breath.  . Albuterol Sulfate 2.5 MG/0.5ML NEBU SMARTSIG:1 Vial(s)  Via Nebulizer Every 6 Hours PRN  . aspirin 81 MG tablet Take 81 mg by mouth daily.  Marland Kitchen atorvastatin (LIPITOR) 20 MG tablet Take 1 tablet (20 mg total) by mouth daily.  . calcium carbonate (TUMS) 500 MG chewable tablet Chew 1 tablet (200 mg of elemental calcium total) by mouth as needed for indigestion or heartburn.  . Cholecalciferol (VITAMIN D) 2000 UNITS tablet Take 2,000 Units by mouth daily.  . cyclobenzaprine (FLEXERIL) 10 MG tablet Take 10 mg by mouth every 6 (six) hours as needed.  Marland Kitchen EPINEPHrine (EPIPEN 2-PAK) 0.3 mg/0.3 mL IJ SOAJ injection Inject into the muscle once.  . famotidine (PEPCID) 40 MG tablet Take 1 tablet  (40 mg total) by mouth daily.  . fish oil-omega-3 fatty acids 1000 MG capsule Take 2 g by mouth daily.   . fluticasone (FLONASE) 50 MCG/ACT nasal spray Place 2 sprays into both nostrils daily.  Marland Kitchen loratadine (CLARITIN) 10 MG tablet Take 10 mg by mouth 2 (two) times daily.  Marland Kitchen losartan-hydrochlorothiazide (HYZAAR) 100-12.5 MG tablet Take 1 tablet by mouth daily.  . Multiple Vitamins-Minerals (EMERGEN-C IMMUNE PO) Take by mouth.  . naproxen (NAPROSYN) 500 MG tablet Take 500 mg by mouth 2 (two) times daily.  . ondansetron (ZOFRAN) 4 MG tablet Zofran 4 mg tablet  1 q 6-8 hrs as needed for post op nausea  . oxyCODONE-acetaminophen (PERCOCET) 5-325 MG tablet Percocet 5 mg-325 mg tablet  Take 1 tablet every 4-6 hours by oral route.  Marland Kitchen PROAIR HFA 108 (90 Base) MCG/ACT inhaler 2 PUFFS EVERY 6 HOURS AS NEEDED FOR WHEEZING OR SHORTNESS OF BREATH  . Red Yeast Rice 600 MG CAPS red yeast rice   No facility-administered encounter medications on file as of 04/10/2019.     REVIEW OF SYSTEMS  : All other systems reviewed and negative except where noted in the History of Present Illness.   PHYSICAL EXAM: BP 122/82 (BP Location: Right Arm, Patient Position: Sitting, Cuff Size: Normal)   Pulse 73   Temp (!) 97.3 F (36.3 C)   Ht 5\' 4"  (1.626 m)   Wt 207 lb (93.9 kg)   SpO2 98%   BMI 35.53 kg/m  General: Well developed AA female in no acute distress Head: Normocephalic and atraumatic Eyes:  Sclerae anicteric, conjunctiva pink. Ears: Normal auditory acuity Lungs: Clear throughout to auscultation; no increased WOB. Heart: Regular rate and rhythm; no M/R/G. Abdomen: Soft, non-distended.  BS present.  Non-tender. Rectal:  No external abnormalities noted.  DRE did not reveal any masses.  Anoscopy revealed internal hemorrhoids with area of bleeding identified. Musculoskeletal: Symmetrical with no gross deformities  Skin: No lesions on visible extremities Extremities: No edema  Neurological: Alert oriented  x 4, grossly non-focal Psychological:  Alert and cooperative. Normal mood and affect  ASSESSMENT AND PLAN: *Rectal bleeding secondary to internal hemorrhoids seen on exam today in the setting of recent constipation.  Her insurance will likely not cover hydrocortisone suppositories.  She was directed to use Preparation H suppositories and to use the prescribed hydrocortisone cream applied to the suppository and insert that way.  Prescription for hydrocortisone cream was sent to the pharmacy. *Constipation: Likely related to her recent anesthesia and narcotic use since last Friday when she had shoulder surgery.  Last bowel movement was on Sunday, 4 days ago.  She was advised to obtain a bottle of magnesium citrate and to drink that this evening.  If she has good results then she needs to start taking MiraLAX twice  daily for the next several weeks until she is no longer using pain medication, etc.  When she discontinues narcotic use then she can decrease the MiraLAX to once daily and then discontinue it eventually if she feels like she is moving her bowels well.   CC:  Rakes, Connye Burkitt, FNP

## 2019-04-10 NOTE — Patient Instructions (Addendum)
If you are age 68 or older, your body mass index should be between 23-30. Your Body mass index is 35.53 kg/m. If this is out of the aforementioned range listed, please consider follow up with your Primary Care Provider.  If you are age 65 or younger, your body mass index should be between 19-25. Your Body mass index is 35.53 kg/m. If this is out of the aformentioned range listed, please consider follow up with your Primary Care Provider.   We have sent the following medications to your pharmacy for you to pick up at your convenience: Hydrocortisone 2.5 % cream apply small amount to suppositories twice daily for 14 days.  Please purchase the following medications over the counter and take as directed: Preparation H suppositories.  1 Bottle of magnesium citrate this evening.  Start Miralax twice daily for the next 2 weeks then may decrease once daily or as needed once symptoms improved.

## 2019-04-11 ENCOUNTER — Telehealth: Payer: Self-pay | Admitting: Gastroenterology

## 2019-04-11 ENCOUNTER — Encounter: Payer: Self-pay | Admitting: Gastroenterology

## 2019-04-11 DIAGNOSIS — K648 Other hemorrhoids: Secondary | ICD-10-CM | POA: Insufficient documentation

## 2019-04-11 DIAGNOSIS — K5900 Constipation, unspecified: Secondary | ICD-10-CM | POA: Insufficient documentation

## 2019-04-11 NOTE — Telephone Encounter (Signed)
Pt stated that she has followed instructions per OV regarding taking magnesium citrate and suppositories but has had no results.

## 2019-04-11 NOTE — Telephone Encounter (Signed)
Pt states she has done everything that was recommended yesterday at her visit. States she drank the whole bottle of mag citrate last evening and has not passed anything but gas. States she has not had a BM since Sunday. Reports she does not feel any urge to have BM, just gas. Please advise.

## 2019-04-14 ENCOUNTER — Telehealth: Payer: Self-pay | Admitting: Family Medicine

## 2019-04-14 ENCOUNTER — Other Ambulatory Visit: Payer: Self-pay | Admitting: Otolaryngology

## 2019-04-14 DIAGNOSIS — H903 Sensorineural hearing loss, bilateral: Secondary | ICD-10-CM

## 2019-04-14 DIAGNOSIS — H905 Unspecified sensorineural hearing loss: Secondary | ICD-10-CM

## 2019-04-14 NOTE — Telephone Encounter (Signed)
The pt states that she feels much better today and did have a "good BM".  She will call with any further concerns

## 2019-04-14 NOTE — Telephone Encounter (Signed)
Will you please check back in on her today and see what her status is?

## 2019-04-14 NOTE — Chronic Care Management (AMB) (Signed)
  Chronic Care Management   Note  04/14/2019 Name: CHANNELL QUATTRONE MRN: [id number] DOB: Dec 22, 1950  DELIANA AVALOS is a 69 y.o. year old female who is a primary care patient of Janora Norlander, DO. I reached out to [name] by phone today in response to a referral sent by Ms. Oletta Cohn Piccini's health plan.     Ms. Guilford was given information about Chronic Care Management services today including:  1. CCM service includes personalized support from designated clinical staff supervised by her physician, including individualized plan of care and coordination with other care providers 2. 24/7 contact phone numbers for assistance for urgent and routine care needs. 3. Service will only be billed when office clinical staff spend 20 minutes or more in a month to coordinate care. 4. Only one practitioner may furnish and bill the service in a calendar month. 5. The patient may stop CCM services at any time (effective at the end of the month) by phone call to the office staff. 6. The patient will be responsible for cost sharing (co-pay) of up to 20% of the service fee (after annual deductible is met).  Patient agreed to services and verbal consent obtained.   Follow up plan: Telephone appointment with care management team member scheduled for:11/21/2019  Noreene Larsson, [place], [place], [place] [address] Direct Dial: [phone] [email or web address]: [email or web address]

## 2019-04-15 NOTE — Progress Notes (Signed)
I agree with the above note, plan 

## 2019-05-07 ENCOUNTER — Other Ambulatory Visit: Payer: Self-pay

## 2019-05-07 ENCOUNTER — Ambulatory Visit
Admission: RE | Admit: 2019-05-07 | Discharge: 2019-05-07 | Disposition: A | Discharge status: Home / Self Care | Payer: Medicare Other | Source: Ambulatory Visit | Attending: Otolaryngology | Admitting: Otolaryngology

## 2019-05-07 DIAGNOSIS — H905 Unspecified sensorineural hearing loss: Secondary | ICD-10-CM

## 2019-05-07 DIAGNOSIS — H903 Sensorineural hearing loss, bilateral: Secondary | ICD-10-CM

## 2019-05-07 MED ORDER — GADOBENATE DIMEGLUMINE 529 MG/ML IV SOLN
19.0000 mL | Freq: Once | INTRAVENOUS | Status: AC | PRN
  Administered 2019-05-07: 19 mL via INTRAVENOUS

## 2019-06-09 ENCOUNTER — Encounter: Payer: Self-pay | Admitting: Cardiovascular Disease

## 2019-06-09 ENCOUNTER — Other Ambulatory Visit: Payer: Self-pay

## 2019-06-09 ENCOUNTER — Ambulatory Visit (INDEPENDENT_AMBULATORY_CARE_PROVIDER_SITE_OTHER): Payer: Medicare Other | Admitting: Cardiovascular Disease

## 2019-06-09 VITALS — BP 132/88 | HR 90 | Ht 64.0 in | Wt 197.0 lb

## 2019-06-09 DIAGNOSIS — I2510 Atherosclerotic heart disease of native coronary artery without angina pectoris: Secondary | ICD-10-CM | POA: Diagnosis not present

## 2019-06-09 NOTE — Patient Instructions (Signed)
Medication Instructions:  No changes *If you need a refill on your cardiac medications before your next appointment, please call your pharmacy*   Lab Work: none If you have labs (blood work) drawn today and your tests are completely normal, you will receive your results only by: Marland Kitchen MyChart Message (if you have MyChart) OR . A paper copy in the mail If you have any lab test that is abnormal or we need to change your treatment, we will call you to review the results.   Testing/Procedures: none   Follow-Up: At [facility] you and your health needs are our priority.  As part of our continuing mission to provide you with exceptional heart care, we have created designated Provider Care Teams.  These Care Teams include your primary Cardiologist (physician) and Advanced Practice Providers (APPs -  Physician Assistants and Nurse Practitioners) who all work together to provide you with the care you need, when you need it.  We recommend signing up for the patient portal called "MyChart".  Sign up information is provided on this After Visit Summary.  MyChart is used to connect with patients for Virtual Visits (Telemedicine).  Patients are able to view lab/test results, encounter notes, upcoming appointments, etc.  Non-urgent messages can be sent to your provider as well.   To learn more about what you can do with MyChart, go to [email or web address].    Your next appointment:   12 month(s)  The format for your next appointment:   Either In Person or Virtual  Provider:   You may see Lauree Chandler, MD or one of the following Advanced Practice Providers on your designated Care Team:    Melina Copa, PA-C  Ermalinda Barrios, PA-C   Other Instructions

## 2019-06-09 NOTE — Progress Notes (Signed)
Chief Complaint  Patient presents with  . Follow-up    CAD   History of Present Illness: 69 yo female with history of HTN, HLD and CAD who is here today for cardiac follow up. She was admitted to [facility] January 2019 with chest pain.and had a coronary CTA which showed mild non-obstructive CAD. She has been statin intolerant in the past. She has also not tolerated Zetia. She was seen in the lipid clinic 02/20/17 and was started on Praluent but she did not tolerate it. She has been on red yeast rice. She failed another trial of Lipitor.    She is here today for follow up. The patient denies any chest pain, dyspnea, palpitations, lower extremity edema, orthopnea, PND, dizziness, near syncope or syncope. She had left rotator cuff surgery. Her arm is feeling better.   Primary Care Physician: Janora Norlander, DO  Past Medical History:  Diagnosis Date  . Allergy   . Allergy to alpha-gal   . Anxiety   . Arthritis   . Asthma   . CAD (coronary artery disease)   . Colon polyp   . Depression   . Food allergy   . GERD (gastroesophageal reflux disease)   . Hyperlipidemia   . Hypertension   . Urticaria     Past Surgical History:  Procedure Laterality Date  . ABDOMINAL HYSTERECTOMY    . Carpel Tunnel    . CHOLECYSTECTOMY    . COLONOSCOPY  02/08/2005   [id number] hemorrhoids otherwise normal rectum/ A few scattered, shallow, left-sided sigmoid diverticula/The remainder of the colonic mucosa appeared normal  . COLONOSCOPY WITH ESOPHAGOGASTRODUODENOSCOPY (EGD) N/A 11/29/2012   Procedure: COLONOSCOPY WITH ESOPHAGOGASTRODUODENOSCOPY (EGD);  Surgeon: Daneil Dolin, MD;  [facility];  Service: Endoscopy;  Laterality: N/A;  10:45  . CYST REMOVAL TRUNK    . HERNIA REPAIR     ventral X 2 with mesh, last one 2011 ([place]). complicated with 2 week hospital stay  . HERNIA REPAIR     with mesh  . NISSEN FUNDOPLICATION  0000000  . POLYPECTOMY    . ROTATOR CUFF REPAIR Left 04/04/2019    . TUBAL LIGATION      Current Outpatient Medications  Medication Sig Dispense Refill  . albuterol (PROVENTIL) (2.5 MG/3ML) 0.083% nebulizer solution Take 3 mLs (2.5 mg total) by nebulization every 6 (six) hours as needed for wheezing or shortness of breath. 150 mL 6  . Albuterol Sulfate 2.5 MG/0.5ML NEBU SMARTSIG:1 Vial(s) Via Nebulizer Every 6 Hours PRN    . aspirin 81 MG tablet Take 81 mg by mouth daily.    . calcium carbonate (TUMS) 500 MG chewable tablet Chew 1 tablet (200 mg of elemental calcium total) by mouth as needed for indigestion or heartburn.    . Cholecalciferol (VITAMIN D) 2000 UNITS tablet Take 2,000 Units by mouth daily.    . cyclobenzaprine (FLEXERIL) 10 MG tablet Take 10 mg by mouth every 6 (six) hours as needed.    Marland Kitchen EPINEPHrine (EPIPEN 2-PAK) 0.3 mg/0.3 mL IJ SOAJ injection Inject into the muscle once.    . famotidine (PEPCID) 40 MG tablet Take 1 tablet (40 mg total) by mouth daily. 90 tablet 3  . fish oil-omega-3 fatty acids 1000 MG capsule Take 2 g by mouth daily.     . fluticasone (FLONASE) 50 MCG/ACT nasal spray Place 2 sprays into both nostrils daily. 16 g 6  . loratadine (CLARITIN) 10 MG tablet Take 10 mg by mouth 2 (two) times daily.    Marland Kitchen  losartan-hydrochlorothiazide (HYZAAR) 100-12.5 MG tablet Take 1 tablet by mouth daily. 90 tablet 3  . Multiple Vitamins-Minerals (EMERGEN-C IMMUNE PO) Take by mouth.    . naproxen (NAPROSYN) 500 MG tablet Take 500 mg by mouth 2 (two) times daily.    . ondansetron (ZOFRAN) 4 MG tablet Zofran 4 mg tablet  1 q 6-8 hrs as needed for post op nausea    . PROAIR HFA 108 (90 Base) MCG/ACT inhaler 2 PUFFS EVERY 6 HOURS AS NEEDED FOR WHEEZING OR SHORTNESS OF BREATH 8.5 g 0  . Red Yeast Rice 600 MG CAPS red yeast rice     No current facility-administered medications for this visit.    Allergies  Allergen Reactions  . Codeine Nausea And Vomiting  . Levaquin [Levofloxacin] Other (See Comments)    Achilles tendon pain  . Meat  [Alpha-Gal]   . Penicillins Other (See Comments)    Does not know  . Praluent [Alirocumab]     Rash and myalgias  . Strawberry (Diagnostic)     Social History   Socioeconomic History  . Marital status: Divorced    Spouse name: Not on file  . Number of children: 3  . Years of education: 12+  . Highest education level: Some college, no degree  Occupational History  . Occupation: private duty Manufacturing systems engineer  Tobacco Use  . Smoking status: Former Smoker    Packs/day: 0.50    Years: 28.00    Pack years: 14.00    Types: Cigarettes    Quit date: 01/17/1996    Years since quitting: 23.4  . Smokeless tobacco: Never Used  Substance and Sexual Activity  . Alcohol use: Yes    Alcohol/week: 2.0 standard drinks    Types: 2 Glasses of wine per week    Comment: occasional use of wine   . Drug use: No  . Sexual activity: Not Currently    Partners: Male    Birth control/protection: Surgical  Other Topics Concern  . Not on file  Social History Narrative  . Not on file   Social Determinants of Health   Financial Resource Strain: Low Risk   . Difficulty of Paying Living Expenses: Not hard at all  Food Insecurity: No Food Insecurity  . Worried About Charity fundraiser in the Last Year: Never true  . Ran Out of Food in the Last Year: Never true  Transportation Needs: No Transportation Needs  . Lack of Transportation (Medical): No  . Lack of Transportation (Non-Medical): No  Physical Activity: Insufficiently Active  . Days of Exercise per Week: 7 days  . Minutes of Exercise per Session: 20 min  Stress: Stress Concern Present  . Feeling of Stress : To some extent  Social Connections: Slightly Isolated  . Frequency of Communication with Friends and Family: More than three times a week  . Frequency of Social Gatherings with Friends and Family: More than three times a week  . Attends Religious Services: More than 4 times per year  . Active Member of Clubs or Organizations: Yes  .  Attends Archivist Meetings: More than 4 times per year  . Marital Status: Divorced  Human resources officer Violence: Not At Risk  . Fear of Current or Ex-Partner: No  . Emotionally Abused: No  . Physically Abused: No  . Sexually Abused: No    Family History  Problem Relation Age of Onset  . Heart disease Mother   . Alzheimer's disease Mother   . Diabetes Mother   .  Other Father        deceased age 56 from some sort of GI problems but no malignancy  . Diabetes Sister   . Asthma Sister   . Colon polyps Sister   . Diabetes Brother   . Cancer Cousin        pancreatic  . Myasthenia gravis Daughter   . Diabetes Daughter   . Colon cancer Neg Hx   . Stomach cancer Neg Hx   . Esophageal cancer Neg Hx   . Rectal cancer Neg Hx     Review of Systems:  As stated in the HPI and otherwise negative.   BP 132/88   Pulse 90   Ht 5\' 4"  (1.626 m)   Wt 197 lb (89.4 kg)   SpO2 99%   BMI 33.81 kg/m   Physical Examination: General: Well developed, well nourished, NAD  HEENT: OP clear, mucus membranes moist  SKIN: warm, dry. No rashes. Neuro: No focal deficits  Musculoskeletal: Muscle strength 5/5 all ext  Psychiatric: Mood and affect normal  Neck: No JVD, no carotid bruits, no thyromegaly, no lymphadenopathy.  Lungs:Clear bilaterally, no wheezes, rhonci, crackles Cardiovascular: Regular rate and rhythm. No murmurs, gallops or rubs. Abdomen:Soft. Bowel sounds present. Non-tender.  Extremities: No lower extremity edema. Pulses are 2 + in the bilateral DP/PT.  EKG:  EKG is ordered today. The ekg ordered today demonstrates sinus  Recent Labs: 03/31/2019: ALT 23; BUN 20; Creatinine, Ser 0.94; Hemoglobin 12.4; Platelets 276; Potassium 3.9; Sodium 144; TSH 2.240   Lipid Panel    Component Value Date/Time   CHOL 264 (H) 03/31/2019 0925   TRIG 126 03/31/2019 0925   TRIG 79 02/17/2016 0817   HDL 46 03/31/2019 0925   HDL 53 02/17/2016 0817   CHOLHDL 5.7 (H) 03/31/2019 0925    CHOLHDL 3.5 06/11/2007 0450   VLDL 20 06/11/2007 0450   LDLCALC 195 (H) 03/31/2019 0925   LDLCALC 171 (H) 08/25/2013 0810     Wt Readings from Last 3 Encounters:  06/09/19 197 lb (89.4 kg)  04/10/19 207 lb (93.9 kg)  03/28/19 200 lb 9.6 oz (91 kg)     Other studies Reviewed: Additional studies/ records that were reviewed today include: . Review of the above records demonstrates:    Assessment and Plan:   1. CAD without angina: Mild CAD noted on coronary CTA in 2019. She is statin intolerant and did not tolerate Praluent. Continue ASA.   2. HTN: BP controlled. No changes.   3. HLD: She has not tolerated statins or Praluent.   Current medicines are reviewed at length with the patient today.  The patient does not have concerns regarding medicines.  The following changes have been made:  no change  Labs/ tests ordered today include:   Orders Placed This Encounter  Procedures  . EKG 12-Lead     Disposition:   FU with me in 12 months   Signed, Lauree Chandler, MD 06/09/2019 11:11 AM    [facility] [place], [place], [place]  [address] Phone: [phone]; Fax: [phone]

## 2019-09-03 ENCOUNTER — Other Ambulatory Visit: Payer: Self-pay | Admitting: Family Medicine

## 2019-09-03 DIAGNOSIS — Z1231 Encounter for screening mammogram for malignant neoplasm of breast: Secondary | ICD-10-CM

## 2019-09-10 ENCOUNTER — Other Ambulatory Visit: Payer: Self-pay

## 2019-09-10 ENCOUNTER — Ambulatory Visit
Admission: RE | Admit: 2019-09-10 | Discharge: 2019-09-10 | Disposition: A | Discharge status: Home / Self Care | Payer: Medicare Other | Source: Ambulatory Visit | Attending: Family Medicine | Admitting: Family Medicine

## 2019-09-10 DIAGNOSIS — Z1231 Encounter for screening mammogram for malignant neoplasm of breast: Secondary | ICD-10-CM

## 2019-10-06 ENCOUNTER — Encounter: Payer: Self-pay | Admitting: Family Medicine

## 2019-10-06 ENCOUNTER — Other Ambulatory Visit: Payer: Self-pay

## 2019-10-06 ENCOUNTER — Ambulatory Visit (INDEPENDENT_AMBULATORY_CARE_PROVIDER_SITE_OTHER): Payer: Medicare Other | Admitting: Family Medicine

## 2019-10-06 VITALS — BP 139/82 | HR 74 | Temp 97.1°F | Ht 64.0 in | Wt 198.0 lb

## 2019-10-06 DIAGNOSIS — M2042 Other hammer toe(s) (acquired), left foot: Secondary | ICD-10-CM

## 2019-10-06 DIAGNOSIS — S93312A Subluxation of tarsal joint of left foot, initial encounter: Secondary | ICD-10-CM

## 2019-10-06 DIAGNOSIS — E782 Mixed hyperlipidemia: Secondary | ICD-10-CM | POA: Diagnosis not present

## 2019-10-06 DIAGNOSIS — I10 Essential (primary) hypertension: Secondary | ICD-10-CM

## 2019-10-06 NOTE — Progress Notes (Signed)
Subjective: CC: Foot pain PCP: Janora Norlander, DO Leah [name] is a 69 y.o. female presenting to clinic today for:  1.  Foot pain Patient reports that her left-sided foot pain did seem to get better shortly after I saw her but then over the last 3 weeks has suddenly gotten worse such that she is altering her gait.  She feels that the hammertoe in the left foot seems to be getting worse and that she may be developing one similarly on the right.  She would like me to put a new referral in as she does want to go ahead and proceed with the evaluation.  Symptoms are refractory to NSAIDs.  2.  Hypertension Patient compliant with Hyzaar.  No chest pain, shortness of breath, dizziness.  3.  Hyperlipidemia Patient did not start the cholesterol medicine.  Instead she is been taking red yeast rice.  She is fasting this morning would like to get a checkup on this cholesterol.   ROS: Per HPI  Allergies  Allergen Reactions  . Codeine Nausea And Vomiting  . Levaquin [Levofloxacin] Other (See Comments)    Achilles tendon pain  . Meat [Alpha-Gal]   . Penicillins Other (See Comments)    Does not know  . Praluent [Alirocumab]     Rash and myalgias  . Strawberry (Diagnostic)    Past Medical History:  Diagnosis Date  . Allergy   . Allergy to alpha-gal   . Anxiety   . Arthritis   . Asthma   . CAD (coronary artery disease)   . Colon polyp   . Depression   . Food allergy   . GERD (gastroesophageal reflux disease)   . Hyperlipidemia   . Hypertension   . Urticaria     Current Outpatient Medications:  .  albuterol (PROVENTIL) (2.5 MG/3ML) 0.083% nebulizer solution, Take 3 mLs (2.5 mg total) by nebulization every 6 (six) hours as needed for wheezing or shortness of breath., Disp: 150 mL, Rfl: 6 .  Albuterol Sulfate 2.5 MG/0.5ML NEBU, SMARTSIG:1 Vial(s) Via Nebulizer Every 6 Hours PRN, Disp: , Rfl:  .  aspirin 81 MG tablet, Take 81 mg by mouth daily., Disp: , Rfl:  .  calcium  carbonate (TUMS) 500 MG chewable tablet, Chew 1 tablet (200 mg of elemental calcium total) by mouth as needed for indigestion or heartburn., Disp: , Rfl:  .  Cholecalciferol (VITAMIN D) 2000 UNITS tablet, Take 2,000 Units by mouth daily., Disp: , Rfl:  .  cyclobenzaprine (FLEXERIL) 10 MG tablet, Take 10 mg by mouth every 6 (six) hours as needed., Disp: , Rfl:  .  EPINEPHrine (EPIPEN 2-PAK) 0.3 mg/0.3 mL IJ SOAJ injection, Inject into the muscle once., Disp: , Rfl:  .  famotidine (PEPCID) 40 MG tablet, Take 1 tablet (40 mg total) by mouth daily., Disp: 90 tablet, Rfl: 3 .  fish oil-omega-3 fatty acids 1000 MG capsule, Take 2 g by mouth daily. , Disp: , Rfl:  .  fluticasone (FLONASE) 50 MCG/ACT nasal spray, Place 2 sprays into both nostrils daily., Disp: 16 g, Rfl: 6 .  loratadine (CLARITIN) 10 MG tablet, Take 10 mg by mouth 2 (two) times daily., Disp: , Rfl:  .  losartan-hydrochlorothiazide (HYZAAR) 100-12.5 MG tablet, Take 1 tablet by mouth daily., Disp: 90 tablet, Rfl: 3 .  Multiple Vitamins-Minerals (EMERGEN-C IMMUNE PO), Take by mouth., Disp: , Rfl:  .  naproxen (NAPROSYN) 500 MG tablet, Take 500 mg by mouth 2 (two) times daily., Disp: , Rfl:  .  ondansetron (ZOFRAN) 4 MG tablet, Zofran 4 mg tablet  1 q 6-8 hrs as needed for post op nausea, Disp: , Rfl:  .  PROAIR HFA 108 (90 Base) MCG/ACT inhaler, 2 PUFFS EVERY 6 HOURS AS NEEDED FOR WHEEZING OR SHORTNESS OF BREATH, Disp: 8.5 g, Rfl: 0 .  Red Yeast Rice 600 MG CAPS, red yeast rice, Disp: , Rfl:  Social History   Socioeconomic History  . Marital status: Divorced    Spouse name: Not on file  . Number of children: 3  . Years of education: 12+  . Highest education level: Some college, no degree  Occupational History  . Occupation: private duty Manufacturing systems engineer  Tobacco Use  . Smoking status: Former Smoker    Packs/day: 0.50    Years: 28.00    Pack years: 14.00    Types: Cigarettes    Quit date: 01/17/1996    Years since quitting: 23.7  .  Smokeless tobacco: Never Used  Vaping Use  . Vaping Use: Never used  Substance and Sexual Activity  . Alcohol use: Yes    Alcohol/week: 2.0 standard drinks    Types: 2 Glasses of wine per week    Comment: occasional use of wine   . Drug use: No  . Sexual activity: Not Currently    Partners: Male    Birth control/protection: Surgical  Other Topics Concern  . Not on file  Social History Narrative  . Not on file   Social Determinants of Health   Financial Resource Strain: Low Risk   . Difficulty of Paying Living Expenses: Not hard at all  Food Insecurity: No Food Insecurity  . Worried About Charity fundraiser in the Last Year: Never true  . Ran Out of Food in the Last Year: Never true  Transportation Needs: No Transportation Needs  . Lack of Transportation (Medical): No  . Lack of Transportation (Non-Medical): No  Physical Activity: Insufficiently Active  . Days of Exercise per Week: 7 days  . Minutes of Exercise per Session: 20 min  Stress: Stress Concern Present  . Feeling of Stress : To some extent  Social Connections: Moderately Integrated  . Frequency of Communication with Friends and Family: More than three times a week  . Frequency of Social Gatherings with Friends and Family: More than three times a week  . Attends Religious Services: More than 4 times per year  . Active Member of Clubs or Organizations: Yes  . Attends Archivist Meetings: More than 4 times per year  . Marital Status: Divorced  Human resources officer Violence: Not At Risk  . Fear of Current or Ex-Partner: No  . Emotionally Abused: No  . Physically Abused: No  . Sexually Abused: No   Family History  Problem Relation Age of Onset  . Heart disease Mother   . Alzheimer's disease Mother   . Diabetes Mother   . Other Father        deceased age 53 from some sort of GI problems but no malignancy  . Diabetes Sister   . Asthma Sister   . Colon polyps Sister   . Diabetes Brother   . Cancer  Cousin        pancreatic  . Myasthenia gravis Daughter   . Diabetes Daughter   . Colon cancer Neg Hx   . Stomach cancer Neg Hx   . Esophageal cancer Neg Hx   . Rectal cancer Neg Hx     Objective: Office vital signs reviewed.  BP 139/82   Pulse 74   Temp (!) 97.1 F (36.2 C) (Temporal)   Ht 5\' 4"  (1.626 m)   Wt 198 lb (89.8 kg)   SpO2 98%   BMI 33.99 kg/m   Physical Examination:  General: Awake, alert, well nourished, No acute distress HEENT: Normal; sclera white Cardio: regular rate and rhythm, S1S2 heard, no murmurs appreciated Pulm: clear to auscultation bilaterally, no wheezes, rhonchi or rales; normal work of breathing on room air Extremities: warm, well perfused, No edema, cyanosis or clubbing; +2 pulses bilaterally MSK: mildly antalgic gait and station   Left foot: She has tenderness to palpation of the cuboid.  There is mild soft tissue swelling.  No palpable bony abnormalities.  Mild hammertoe noted of the second digit.  She has bunions bilaterally. Skin: dry; intact; no rashes or lesions Neuro: Sensation intact  Assessment/ Plan: 69 y.o. female   1. Essential hypertension Well-controlled.  Continue current regimen  2. Hammer toe of left foot Referral to podiatry placed. - Ambulatory referral to Podiatry  3. Cuboid syndrome of left foot Suspect cuboid syndrome.  Ice, NSAIDs - Ambulatory referral to Podiatry  4. Mixed hyperlipidemia Check fasting lipid. - Lipid Panel   Orders Placed This Encounter  Procedures  . Lipid Panel  . Ambulatory referral to Podiatry    Referral Priority:   Routine    Referral Type:   Consultation    Referral Reason:   Specialty Services Required    Requested Specialty:   Podiatry    Number of Visits Requested:   1   No orders of the defined types were placed in this encounter.    Janora Norlander, DO [place] [phone]

## 2019-10-07 LAB — LIPID PANEL
Chol/HDL Ratio: 5.1 ratio — ABNORMAL HIGH (ref 0.0–4.4)
Cholesterol, Total: 258 mg/dL — ABNORMAL HIGH (ref 100–199)
HDL: 51 mg/dL (ref >39)
LDL Chol Calc (NIH): 177 mg/dL — ABNORMAL HIGH (ref 0–99)
Triglycerides: 165 mg/dL — ABNORMAL HIGH (ref 0–149)
VLDL Cholesterol Cal: 30 mg/dL (ref 5–40)

## 2019-10-08 ENCOUNTER — Other Ambulatory Visit: Payer: Self-pay | Admitting: *Deleted

## 2019-10-08 MED ORDER — ATORVASTATIN CALCIUM 20 MG PO TABS
20.0000 mg | ORAL_TABLET | Freq: Every day | ORAL | 3 refills | Status: DC
Start: 2019-10-08 — End: 2019-12-31

## 2019-10-28 ENCOUNTER — Ambulatory Visit (INDEPENDENT_AMBULATORY_CARE_PROVIDER_SITE_OTHER): Payer: Medicare Other

## 2019-10-28 ENCOUNTER — Encounter: Payer: Self-pay | Admitting: Nurse Practitioner

## 2019-10-28 ENCOUNTER — Ambulatory Visit (INDEPENDENT_AMBULATORY_CARE_PROVIDER_SITE_OTHER): Payer: Medicare Other | Admitting: Nurse Practitioner

## 2019-10-28 ENCOUNTER — Other Ambulatory Visit: Payer: Self-pay

## 2019-10-28 VITALS — BP 123/80 | HR 74 | Temp 98.8°F | Ht 64.0 in | Wt 196.0 lb

## 2019-10-28 DIAGNOSIS — R109 Unspecified abdominal pain: Secondary | ICD-10-CM | POA: Diagnosis not present

## 2019-10-28 DIAGNOSIS — Z23 Encounter for immunization: Secondary | ICD-10-CM | POA: Diagnosis not present

## 2019-10-28 DIAGNOSIS — M25561 Pain in right knee: Secondary | ICD-10-CM

## 2019-10-28 DIAGNOSIS — M25559 Pain in unspecified hip: Secondary | ICD-10-CM | POA: Diagnosis not present

## 2019-10-28 DIAGNOSIS — R1031 Right lower quadrant pain: Secondary | ICD-10-CM | POA: Diagnosis not present

## 2019-10-28 MED ORDER — PREDNISONE 10 MG (21) PO TBPK: ORAL_TABLET | ORAL | 0 refills | Status: DC

## 2019-10-28 MED ORDER — IBUPROFEN 600 MG PO TABS: 600.0000 mg | ORAL_TABLET | Freq: Three times a day (TID) | ORAL | 0 refills | Status: DC | PRN

## 2019-10-28 NOTE — Assessment & Plan Note (Signed)
Patient is reporting new pain of the right knee.  Pain started about a week ago and symptoms are worse.  Patient describes pain as aching and 6 out of 10.  Pain radiates from the knee to right upper hip.  Patient has not had any knee or hip surgery in the past.  On assessment knee is warm to touch, right knee is swollen. Right hip, right knee x-ray ordered Provided education to patient with printed handouts given Advised patient to apply ice, anti-inflammatory, prednisone taper, and elevation. Follow-up with worsening or unresolved symptoms  Rx sent to pharmacy.

## 2019-10-28 NOTE — Patient Instructions (Addendum)
Abdominal Pain, Adult Many things can cause belly (abdominal) pain. Most times, belly pain is not dangerous. Many cases of belly pain can be watched and treated at home. Sometimes, though, belly pain is serious. Your doctor will try to find the cause of your belly pain. Follow these instructions at home:  Medicines  Take over-the-counter and prescription medicines only as told by your doctor.  Do not take medicines that help you poop (laxatives) unless told by your doctor. General instructions  Watch your belly pain for any changes.  Drink enough fluid to keep your pee (urine) pale yellow.  Keep all follow-up visits as told by your doctor. This is important. Contact a doctor if:  Your belly pain changes or gets worse.  You are not hungry, or you lose weight without trying.  You are having trouble pooping (constipated) or have watery poop (diarrhea) for more than 2-3 days.  You have pain when you pee or poop.  Your belly pain wakes you up at night.  Your pain gets worse with meals, after eating, or with certain foods.  You are vomiting and cannot keep anything down.  You have a fever.  You have blood in your pee. Get help right away if:  Your pain does not go away as soon as your doctor says it should.  You cannot stop vomiting.  Your pain is only in areas of your belly, such as the right side or the left lower part of the belly.  You have bloody or black poop, or poop that looks like tar.  You have very bad pain, cramping, or bloating in your belly.  You have signs of not having enough fluid or water in your body (dehydration), such as: ? Dark pee, very little pee, or no pee. ? Cracked lips. ? Dry mouth. ? Sunken eyes. ? Sleepiness. ? Weakness.  You have trouble breathing or chest pain. Summary  Many cases of belly pain can be watched and treated at home.  Watch your belly pain for any changes.  Take over-the-counter and prescription medicines only as  told by your doctor.  Contact a doctor if your belly pain changes or gets worse.  Get help right away if you have very bad pain, cramping, or bloating in your belly. This information is not intended to replace advice given to you by your health care provider. Make sure you discuss any questions you have with your health care provider. Document Revised: 05/13/2018 Document Reviewed: 05/13/2018 Elsevier Patient Education  [place]. Acute Knee Pain, Adult Many things can cause knee pain. Sometimes, knee pain is sudden (acute) and may be caused by damage, swelling, or irritation of the muscles and tissues that support your knee. The pain often goes away on its own with time and rest. If the pain does not go away, tests may be done to find out what is causing the pain. Follow these instructions at home: Pay attention to any changes in your symptoms. Take these actions to relieve your pain. If you have a knee sleeve or brace:   Wear the sleeve or brace as told by your doctor. Remove it only as told by your doctor.  Loosen the sleeve or brace if your toes: ? Tingle. ? Become numb. ? Turn cold and blue.  Keep the sleeve or brace clean.  If the sleeve or brace is not waterproof: ? Do not let it get wet. ? Cover it with a watertight covering when you take a bath  or shower. Activity  Rest your knee.  Do not do things that cause pain.  Avoid activities where both feet leave the ground at the same time (high-impact activities). Examples are running, jumping rope, and doing jumping jacks.  Work with a physical therapist to make a safe exercise program, as told by your doctor. Managing pain, stiffness, and swelling   If told, put ice on the knee: ? Put ice in a plastic bag. ? Place a towel between your skin and the bag. ? Leave the ice on for 20 minutes, 2-3 times a day.  If told, put pressure (compression) on your injured knee to control swelling, give support, and help with  discomfort. Compression may be done with an elastic bandage. General instructions  Take all medicines only as told by your doctor.  Raise (elevate) your knee while you are sitting or lying down. Make sure your knee is higher than your heart.  Sleep with a pillow under your knee.  Do not use any products that contain nicotine or tobacco. These include cigarettes, e-cigarettes, and chewing tobacco. These products may slow down healing. If you need help quitting, ask your doctor.  If you are overweight, work with your doctor and a food expert (dietitian) to set goals to lose weight. Being overweight can make your knee hurt more.  Keep all follow-up visits as told by your doctor. This is important. Contact a doctor if:  The knee pain does not stop.  The knee pain changes or gets worse.  You have a fever along with knee pain.  Your knee feels warm when you touch it.  Your knee gives out or locks up. Get help right away if:  Your knee swells, and the swelling gets worse.  You cannot move your knee.  You have very bad knee pain. Summary  Many things can cause knee pain. The pain often goes away on its own with time and rest.  Your doctor may do tests to find out the cause of the pain.  Pay attention to any changes in your symptoms. Relieve your pain with rest, medicines, light activity, and use of ice.  Get help right away if you cannot move your knee or your knee pain is very bad. This information is not intended to replace advice given to you by your health care provider. Make sure you discuss any questions you have with your health care provider. Document Revised: 06/14/2017 Document Reviewed: 06/14/2017 Elsevier Patient Education  [place].

## 2019-10-28 NOTE — Progress Notes (Signed)
Acute Office Visit  Subjective:    Patient ID: Leah [name], female    DOB: 15-Aug-1950, 69 y.o.   MRN: [id number]  Chief Complaint  Patient presents with   Knee Pain    right    Abdominal Pain    right side     Knee Pain  The incident occurred 5 to 7 days ago. The incident occurred at home. There was no injury mechanism. The pain is present in the right hip and right knee. The quality of the pain is described as aching. The pain is at a severity of 6/10. The pain has been intermittent since onset. Associated symptoms include an inability to bear weight. She reports no foreign bodies present. The symptoms are aggravated by movement and palpation. She has tried nothing for the symptoms.  Abdominal Pain This is a new problem. The current episode started in the past 7 days. The onset quality is undetermined. The problem occurs constantly. The problem has been unchanged. The pain is located in the RUQ. The pain is at a severity of 4/10. The quality of the pain is aching. The abdominal pain does not radiate. Pertinent negatives include no constipation, diarrhea, fever, nausea, vomiting or weight loss. Nothing aggravates the pain. The pain is relieved by nothing.    Past Medical History:  Diagnosis Date   Allergy    Allergy to alpha-gal    Anxiety    Arthritis    Asthma    CAD (coronary artery disease)    Colon polyp    Depression    Food allergy    GERD (gastroesophageal reflux disease)    Hyperlipidemia    Hypertension    Urticaria     Past Surgical History:  Procedure Laterality Date   ABDOMINAL HYSTERECTOMY     Carpel Tunnel     CHOLECYSTECTOMY     COLONOSCOPY  02/08/2005   BZJ:IRCVELFY hemorrhoids otherwise normal rectum/ A few scattered, shallow, left-sided sigmoid diverticula/The remainder of the colonic mucosa appeared normal   COLONOSCOPY WITH ESOPHAGOGASTRODUODENOSCOPY (EGD) N/A 11/29/2012   Procedure: COLONOSCOPY WITH  ESOPHAGOGASTRODUODENOSCOPY (EGD);  Surgeon: Daneil Dolin, MD;  Location: [facility];  Service: Endoscopy;  Laterality: N/A;  10:45   CYST REMOVAL TRUNK     HERNIA REPAIR     ventral X 2 with mesh, last one 2011 ([place]). complicated with 2 week hospital stay   [facility]     with mesh   NISSEN FUNDOPLICATION  1017   POLYPECTOMY     ROTATOR CUFF REPAIR Left 04/04/2019   TUBAL LIGATION      Family History  Problem Relation Age of Onset   Heart disease Mother    Alzheimer's disease Mother    Diabetes Mother    Other Father        deceased age 50 from some sort of GI problems but no malignancy   Diabetes Sister    Asthma Sister    Colon polyps Sister    Diabetes Brother    Cancer Cousin        pancreatic   Myasthenia gravis Daughter    Diabetes Daughter    Colon cancer Neg Hx    Stomach cancer Neg Hx    Esophageal cancer Neg Hx    Rectal cancer Neg Hx     Social History   Socioeconomic History   Marital status: Divorced    Spouse name: Not on file   Number of children: 3   Years of education:  12+   Highest education level: Some college, no degree  Occupational History   Occupation: private duty Manufacturing systems engineer  Tobacco Use   Smoking status: Former Smoker    Packs/day: 0.50    Years: 28.00    Pack years: 14.00    Types: Cigarettes    Quit date: 01/17/1996    Years since quitting: 23.7   Smokeless tobacco: Never Used  Vaping Use   Vaping Use: Never used  Substance and Sexual Activity   Alcohol use: Yes    Alcohol/week: 2.0 standard drinks    Types: 2 Glasses of wine per week    Comment: occasional use of wine    Drug use: No   Sexual activity: Not Currently    Partners: Male    Birth control/protection: Surgical  Other Topics Concern   Not on file  Social History Narrative   Not on file   Social Determinants of Health   Financial Resource Strain: Low Risk    Difficulty of Paying Living Expenses: Not hard at  all  Food Insecurity: No Food Insecurity   Worried About Charity fundraiser in the Last Year: Never true   [place] in the Last Year: Never true  Transportation Needs: No Transportation Needs   Lack of Transportation (Medical): No   Lack of Transportation (Non-Medical): No  Physical Activity: Insufficiently Active   Days of Exercise per Week: 7 days   Minutes of Exercise per Session: 20 min  Stress: Stress Concern Present   Feeling of Stress : To some extent  Social Connections: Moderately Integrated   Frequency of Communication with Friends and Family: More than three times a week   Frequency of Social Gatherings with Friends and Family: More than three times a week   Attends Religious Services: More than 4 times per year   Active Member of [employer] or Organizations: Yes   Attends Music therapist: More than 4 times per year   Marital Status: Divorced  Human resources officer Violence: Not At Risk   Fear of Current or Ex-Partner: No   Emotionally Abused: No   Physically Abused: No   Sexually Abused: No    Outpatient Medications Prior to Visit  Medication Sig Dispense Refill   albuterol (PROVENTIL) (2.5 MG/3ML) 0.083% nebulizer solution Take 3 mLs (2.5 mg total) by nebulization every 6 (six) hours as needed for wheezing or shortness of breath. 150 mL 6   Albuterol Sulfate 2.5 MG/0.5ML NEBU SMARTSIG:1 Vial(s) Via Nebulizer Every 6 Hours PRN     aspirin 81 MG tablet Take 81 mg by mouth daily.     atorvastatin (LIPITOR) 20 MG tablet Take 1 tablet (20 mg total) by mouth daily. 90 tablet 3   calcium carbonate (TUMS) 500 MG chewable tablet Chew 1 tablet (200 mg of elemental calcium total) by mouth as needed for indigestion or heartburn.     Cholecalciferol (VITAMIN D) 2000 UNITS tablet Take 2,000 Units by mouth daily.     EPINEPHrine (EPIPEN 2-PAK) 0.3 mg/0.3 mL IJ SOAJ injection Inject into the muscle once.     famotidine (PEPCID) 40 MG tablet Take 1  tablet (40 mg total) by mouth daily. 90 tablet 3   fish oil-omega-3 fatty acids 1000 MG capsule Take 2 g by mouth daily.      fluticasone (FLONASE) 50 MCG/ACT nasal spray Place 2 sprays into both nostrils daily. 16 g 6   loratadine (CLARITIN) 10 MG tablet Take 10 mg by mouth 2 (two)  times daily.     losartan-hydrochlorothiazide (HYZAAR) 100-12.5 MG tablet Take 1 tablet by mouth daily. 90 tablet 3   Multiple Vitamins-Minerals (EMERGEN-C IMMUNE PO) Take by mouth.     PROAIR HFA 108 (90 Base) MCG/ACT inhaler 2 PUFFS EVERY 6 HOURS AS NEEDED FOR WHEEZING OR SHORTNESS OF BREATH 8.5 g 0   cyclobenzaprine (FLEXERIL) 10 MG tablet Take 10 mg by mouth every 6 (six) hours as needed.     naproxen (NAPROSYN) 500 MG tablet Take 500 mg by mouth 2 (two) times daily.     ondansetron (ZOFRAN) 4 MG tablet Zofran 4 mg tablet  1 q 6-8 hrs as needed for post op nausea     Red Yeast Rice 600 MG CAPS red yeast rice     No facility-administered medications prior to visit.    Allergies  Allergen Reactions   Codeine Nausea And Vomiting   Levaquin [Levofloxacin] Other (See Comments)    Achilles tendon pain   Meat [Alpha-Gal]    Penicillins Other (See Comments)    Does not know   Praluent [Alirocumab]     Rash and myalgias   Strawberry (Diagnostic)     Review of Systems  Constitutional: Negative for fever and weight loss.  Gastrointestinal: Positive for abdominal pain. Negative for constipation, diarrhea, nausea and vomiting.  Musculoskeletal: Positive for joint swelling.       Right knee pain, right hip pain.  All other systems reviewed and are negative.      Objective:    Physical Exam Vitals reviewed.  Constitutional:      Appearance: She is well-developed. She is obese.  HENT:     Head: Normocephalic.     Mouth/Throat:     Mouth: Mucous membranes are moist.  Cardiovascular:     Rate and Rhythm: Normal rate and regular rhythm.     Pulses: Normal pulses.     Heart sounds:  Normal heart sounds.  Pulmonary:     Effort: Pulmonary effort is normal.     Breath sounds: Normal breath sounds.  Abdominal:     General: Bowel sounds are normal.     Palpations: Abdomen is soft.     Tenderness: There is abdominal tenderness in the right lower quadrant.     Comments: Palpable lumps  Musculoskeletal:        General: Swelling and tenderness present.     Cervical back: Normal range of motion.     Comments: Right knee edema  Skin:    General: Skin is warm.  Neurological:     Mental Status: She is alert and oriented to person, place, and time.  Psychiatric:        Mood and Affect: Mood normal.        Behavior: Behavior normal.     BP 123/80    Pulse 74    Temp 98.8 F (37.1 C) (Temporal)    Ht 5\' 4"  (1.626 m)    Wt 196 lb (88.9 kg)    SpO2 100%    BMI 33.64 kg/m  Wt Readings from Last 3 Encounters:  10/28/19 196 lb (88.9 kg)  10/06/19 198 lb (89.8 kg)  06/09/19 197 lb (89.4 kg)    Health Maintenance Due  Topic Date Due   COLON CANCER SCREENING ANNUAL FOBT  02/26/2019   INFLUENZA VACCINE  08/17/2019    There are no preventive care reminders to display for this patient.   Lab Results  Component Value Date   TSH 2.240 03/31/2019  Lab Results  Component Value Date   WBC 3.4 03/31/2019   HGB 12.4 03/31/2019   HCT 38.4 03/31/2019   MCV 85 03/31/2019   PLT 276 03/31/2019   Lab Results  Component Value Date   NA 144 03/31/2019   K 3.9 03/31/2019   CO2 25 03/31/2019   GLUCOSE 93 03/31/2019   BUN 20 03/31/2019   CREATININE 0.94 03/31/2019   BILITOT 0.3 03/31/2019   ALKPHOS 93 03/31/2019   AST 21 03/31/2019   ALT 23 03/31/2019   PROT 6.0 03/31/2019   ALBUMIN 3.8 03/31/2019   CALCIUM 9.6 03/31/2019   ANIONGAP 10 08/22/2018   Lab Results  Component Value Date   CHOL 258 (H) 10/06/2019   Lab Results  Component Value Date   HDL 51 10/06/2019   Lab Results  Component Value Date   LDLCALC 177 (H) 10/06/2019   Lab Results  Component  Value Date   TRIG 165 (H) 10/06/2019   Lab Results  Component Value Date   CHOLHDL 5.1 (H) 10/06/2019   Lab Results  Component Value Date   HGBA1C 5.9 03/31/2019       Assessment & Plan:  .p Problem List Items Addressed This Visit      Other   Hip pain   Relevant Orders   DG HIP UNILAT W OR W/O PELVIS 2-3 VIEWS RIGHT   Acute pain of right knee - Primary    Patient is reporting new pain of the right knee.  Pain started about a week ago and symptoms are worse.  Patient describes pain as aching and 6 out of 10.  Pain radiates from the knee to right upper hip.  Patient has not had any knee or hip surgery in the past.  On assessment knee is warm to touch, right knee is swollen. Right hip, right knee x-ray ordered Provided education to patient with printed handouts given Advised patient to apply ice, anti-inflammatory, prednisone taper, and elevation. Follow-up with worsening or unresolved symptoms  Rx sent to pharmacy.      Relevant Orders   DG Knee 1-2 Views Right   Right lower quadrant abdominal pain    Patient is reporting new right lower quadrant abdominal pain in the last 5 to 7 days.  On assessment, palpable lumps right lower quadrant area.  Patient describes pain as aching, patient reports pain 5 out of 10 on a scale of 0-10.  Patient has not used to alleviate pain.  Patient has had a history of multiple abdominal surgeries in the past.  Patient is not reporting any nausea, diarrhea vomiting or fever. Ordered KUB.  Provided education to patient with printed handouts given. Follow-up with worsening or unresolved symptoms.      Relevant Orders   DG Abd 1 View       No orders of the defined types were placed in this encounter.    Ivy Lynn, NP

## 2019-10-28 NOTE — Assessment & Plan Note (Signed)
Patient is reporting new right lower quadrant abdominal pain in the last 5 to 7 days.  On assessment, palpable lumps right lower quadrant area.  Patient describes pain as aching, patient reports pain 5 out of 10 on a scale of 0-10.  Patient has not used to alleviate pain.  Patient has had a history of multiple abdominal surgeries in the past.  Patient is not reporting any nausea, diarrhea vomiting or fever. Ordered KUB.  Provided education to patient with printed handouts given. Follow-up with worsening or unresolved symptoms.

## 2019-11-07 ENCOUNTER — Ambulatory Visit (INDEPENDENT_AMBULATORY_CARE_PROVIDER_SITE_OTHER): Payer: Medicare Other | Admitting: Podiatry

## 2019-11-07 ENCOUNTER — Other Ambulatory Visit: Payer: Self-pay

## 2019-11-07 ENCOUNTER — Ambulatory Visit (INDEPENDENT_AMBULATORY_CARE_PROVIDER_SITE_OTHER): Payer: Medicare Other

## 2019-11-07 ENCOUNTER — Encounter: Payer: Self-pay | Admitting: Podiatry

## 2019-11-07 DIAGNOSIS — M2011 Hallux valgus (acquired), right foot: Secondary | ICD-10-CM

## 2019-11-07 DIAGNOSIS — M7731 Calcaneal spur, right foot: Secondary | ICD-10-CM | POA: Diagnosis not present

## 2019-11-07 DIAGNOSIS — M21619 Bunion of unspecified foot: Secondary | ICD-10-CM

## 2019-11-07 DIAGNOSIS — M2042 Other hammer toe(s) (acquired), left foot: Secondary | ICD-10-CM | POA: Diagnosis not present

## 2019-11-07 DIAGNOSIS — M779 Enthesopathy, unspecified: Secondary | ICD-10-CM

## 2019-11-07 DIAGNOSIS — M7732 Calcaneal spur, left foot: Secondary | ICD-10-CM

## 2019-11-07 DIAGNOSIS — M79672 Pain in left foot: Secondary | ICD-10-CM

## 2019-11-07 DIAGNOSIS — M2012 Hallux valgus (acquired), left foot: Secondary | ICD-10-CM | POA: Diagnosis not present

## 2019-11-07 DIAGNOSIS — I2510 Atherosclerotic heart disease of native coronary artery without angina pectoris: Secondary | ICD-10-CM | POA: Diagnosis not present

## 2019-11-07 DIAGNOSIS — M79671 Pain in right foot: Secondary | ICD-10-CM | POA: Diagnosis not present

## 2019-11-07 MED ORDER — NONFORMULARY OR COMPOUNDED ITEM: 3 refills | Status: DC

## 2019-11-07 NOTE — Patient Instructions (Signed)
I have ordered a medication for you that will come from [facility] in [place]. They should be calling you to verify insurance and will mail the medication to you. If you live close by then you can go by their pharmacy to pick up the medication. Their phone number is [phone]. If you do not hear from them in the next few days, please give [place] a call at [phone].    Bunion  A bunion is a bump on the base of the big toe that forms when the bones of the big toe joint move out of position. Bunions may be small at first, but they often get larger over time. They can make walking painful. What are the causes? A bunion may be caused by:  Wearing narrow or pointed shoes that force the big toe to press against the other toes.  Abnormal foot development that causes the foot to roll inward (pronate).  Changes in the foot that are caused by certain diseases, such as rheumatoid arthritis or polio.  A foot injury. What increases the risk? The following factors may make you more likely to develop this condition:  Wearing shoes that squeeze the toes together.  Having certain diseases, such as: ? Rheumatoid arthritis. ? Polio. ? Cerebral palsy.  Having family members who have bunions.  Being born with a foot deformity, such as flat feet or low arches.  Doing activities that put a lot of pressure on the feet, such as ballet dancing. What are the signs or symptoms? The main symptom of a bunion is a noticeable bump on the big toe. Other symptoms may include:  Pain.  Swelling around the big toe.  Redness and inflammation.  Thick or hardened skin on the big toe or between the toes.  Stiffness or loss of motion in the big toe.  Trouble with walking. How is this diagnosed? A bunion may be diagnosed based on your symptoms, medical history, and activities. You may have tests, such as:  X-rays. These allow your health care provider to check the position of the bones in your  foot and look for damage to your joint. They also help your health care provider determine the severity of your bunion and the best way to treat it.  Joint aspiration. In this test, a sample of fluid is removed from the toe joint. This test may be done if you are in a lot of pain. It helps rule out diseases that cause painful swelling of the joints, such as arthritis. How is this treated? Treatment depends on the severity of your symptoms. The goal of treatment is to relieve symptoms and prevent the bunion from getting worse. Your health care provider may recommend:  Wearing shoes that have a wide toe box.  Using bunion pads to cushion the affected area.  Taping your toes together to keep them in a normal position.  Placing a device inside your shoe (orthotics) to help reduce pressure on your toe joint.  Taking medicine to ease pain, inflammation, and swelling.  Applying heat or ice to the affected area.  Doing stretching exercises.  Surgery to remove scar tissue and move the toes back into their normal position. This treatment is rare. Follow these instructions at home: Managing pain, stiffness, and swelling   If directed, put ice on the painful area: ? Put ice in a plastic bag. ? Place a towel between your skin and the bag. ? Leave the ice on for 20 minutes, 2-3 times a day.  Activity   If directed, apply heat to the affected area before you exercise. Use the heat source that your health care provider recommends, such as a moist heat pack or a heating pad. ? Place a towel between your skin and the heat source. ? Leave the heat on for 20-30 minutes. ? Remove the heat if your skin turns bright red. This is especially important if you are unable to feel pain, heat, or cold. You may have a greater risk of getting burned.  Do exercises as told by your health care provider. General instructions  Support your toe joint with proper footwear, shoe padding, or taping as told by your  health care provider.  Take over-the-counter and prescription medicines only as told by your health care provider.  Keep all follow-up visits as told by your health care provider. This is important. Contact a health care provider if your symptoms:  Get worse.  Do not improve in 2 weeks. Get help right away if you have:  Severe pain and trouble with walking. Summary  A bunion is a bump on the base of the big toe that forms when the bones of the big toe joint move out of position.  Bunions can make walking painful.  Treatment depends on the severity of your symptoms.  Support your toe joint with proper footwear, shoe padding, or taping as told by your health care provider. This information is not intended to replace advice given to you by your health care provider. Make sure you discuss any questions you have with your health care provider. Document Revised: 07/09/2017 Document Reviewed: 05/15/2017 Elsevier Patient Education  [place] Toe  Hammer toe is a change in the shape (a deformity) of your toe. The deformity causes the middle joint of your toe to stay bent. This causes pain, especially when you are wearing shoes. Hammer toe starts gradually. At first, the toe can be straightened. Gradually over time, the deformity becomes stiff and permanent. Early treatments to keep the toe straight may relieve pain. As the deformity becomes stiff and permanent, surgery may be needed to straighten the toe. What are the causes? Hammer toe is caused by abnormal bending of the toe joint that is closest to your foot. It happens gradually over time. This pulls on the muscles and connections (tendons) of the toe joint, making them weak and stiff. It is often related to wearing shoes that are too short or narrow and do not let your toes straighten. What increases the risk? You may be at greater risk for hammer toe if you:  Are female.  Are older.  Wear shoes that are too  small.  Wear high-heeled shoes that pinch your toes.  Are a Engineer, mining.  Have a second toe that is longer than your big toe (first toe).  Injure your foot or toe.  Have arthritis.  Have a family history of hammer toe.  Have a nerve or muscle disorder. What are the signs or symptoms? The main symptoms of this condition are pain and deformity of the toe. The pain is worse when wearing shoes, walking, or running. Other symptoms may include:  Corns or calluses over the bent part of the toe or between the toes.  Redness and a burning feeling on the toe.  An open sore that forms on the top of the toe.  Not being able to straighten the toe. How is this diagnosed? This condition is diagnosed based on your symptoms and  a physical exam. During the exam, your health care provider will try to straighten your toe to see how stiff the deformity is. You may also have tests, such as:  A blood test to check for rheumatoid arthritis.  An X-ray to show how severe the deformity is. How is this treated? Treatment for this condition will depend on how stiff the deformity is. Surgery is often needed. However, sometimes a hammer toe can be straightened without surgery. Treatments that do not involve surgery include:  Taping the toe into a straightened position.  Using pads and cushions to protect the toe (orthotics).  Wearing shoes that provide enough room for the toes.  Doing toe-stretching exercises at home.  Taking an NSAID to reduce pain and swelling. If these treatments do not help or the toe cannot be straightened, surgery is the next option. The most common surgeries used to straighten a hammer toe include:  Arthroplasty. In this procedure, part of the joint is removed, and that allows the toe to straighten.  Fusion. In this procedure, cartilage between the two bones of the joint is taken out and the bones are fused together into one longer bone.  Implantation. In this procedure,  part of the bone is removed and replaced with an implant to let the toe move again.  Flexor tendon transfer. In this procedure, the tendons that curl the toes down (flexor tendons) are repositioned. Follow these instructions at home:  Take over-the-counter and prescription medicines only as told by your health care provider.  Do toe straightening and stretching exercises as told by your health care provider.  Keep all follow-up visits as told by your health care provider. This is important. How is this prevented?  Wear shoes that give your toes enough room and do not cause pain.  Do not wear high-heeled shoes. Contact a health care provider if:  Your pain gets worse.  Your toe becomes red or swollen.  You develop an open sore on your toe. This information is not intended to replace advice given to you by your health care provider. Make sure you discuss any questions you have with your health care provider. Document Revised: 12/15/2016 Document Reviewed: 04/28/2015 Elsevier Patient Education  2020 [employer].

## 2019-11-10 NOTE — Progress Notes (Signed)
Subjective:   Patient ID: Leah [name], female   DOB: 69 y.o.   MRN: [id number]   HPI 69 year old female presents the office today for concerns of a hammertoe of the left second toe, bunion on the right foot as well as pain to the lateral aspect of the left foot.  She states that the hammertoe started about 6 months ago.  The bunions been ongoing for some time.  The pain the lateral left foot is been ongoing for 2 months.  She occasionally burning to the lateral aspect of the foot on the left side.  She denies any recent injury or falls and she said no recent treatment.  She has no other concerns today.   Review of Systems  All other systems reviewed and are negative.  Past Medical History:  Diagnosis Date  . Allergy   . Allergy to alpha-gal   . Anxiety   . Arthritis   . Asthma   . CAD (coronary artery disease)   . Colon polyp   . Depression   . Food allergy   . GERD (gastroesophageal reflux disease)   . Hyperlipidemia   . Hypertension   . Urticaria     Past Surgical History:  Procedure Laterality Date  . ABDOMINAL HYSTERECTOMY    . Carpel Tunnel    . CHOLECYSTECTOMY    . COLONOSCOPY  02/08/2005   WUJ:[id number] hemorrhoids otherwise normal rectum/ A few scattered, shallow, left-sided sigmoid diverticula/The remainder of the colonic mucosa appeared normal  . COLONOSCOPY WITH ESOPHAGOGASTRODUODENOSCOPY (EGD) N/A 11/29/2012   Procedure: COLONOSCOPY WITH ESOPHAGOGASTRODUODENOSCOPY (EGD);  Surgeon: Daneil Dolin, MD;  Location: [facility];  Service: Endoscopy;  Laterality: N/A;  10:45  . CYST REMOVAL TRUNK    . HERNIA REPAIR     ventral X 2 with mesh, last one 2011 ([place]). complicated with 2 week hospital stay  . HERNIA REPAIR     with mesh  . NISSEN FUNDOPLICATION  2956  . POLYPECTOMY    . ROTATOR CUFF REPAIR Left 04/04/2019  . TUBAL LIGATION       Current Outpatient Medications:  .  albuterol (PROVENTIL) (2.5 MG/3ML) 0.083% nebulizer solution, Take 3 mLs  (2.5 mg total) by nebulization every 6 (six) hours as needed for wheezing or shortness of breath., Disp: 150 mL, Rfl: 6 .  Albuterol Sulfate 2.5 MG/0.5ML NEBU, SMARTSIG:1 Vial(s) Via Nebulizer Every 6 Hours PRN, Disp: , Rfl:  .  aspirin 81 MG tablet, Take 81 mg by mouth daily., Disp: , Rfl:  .  atorvastatin (LIPITOR) 20 MG tablet, Take 1 tablet (20 mg total) by mouth daily., Disp: 90 tablet, Rfl: 3 .  calcium carbonate (TUMS) 500 MG chewable tablet, Chew 1 tablet (200 mg of elemental calcium total) by mouth as needed for indigestion or heartburn., Disp: , Rfl:  .  Cholecalciferol (VITAMIN D) 2000 UNITS tablet, Take 2,000 Units by mouth daily., Disp: , Rfl:  .  EPINEPHrine (EPIPEN 2-PAK) 0.3 mg/0.3 mL IJ SOAJ injection, Inject into the muscle once., Disp: , Rfl:  .  famotidine (PEPCID) 40 MG tablet, Take 1 tablet (40 mg total) by mouth daily., Disp: 90 tablet, Rfl: 3 .  fish oil-omega-3 fatty acids 1000 MG capsule, Take 2 g by mouth daily. , Disp: , Rfl:  .  fluticasone (FLONASE) 50 MCG/ACT nasal spray, Place 2 sprays into both nostrils daily., Disp: 16 g, Rfl: 6 .  ibuprofen (ADVIL) 600 MG tablet, Take 1 tablet (600 mg total) by mouth every 8 (  eight) hours as needed., Disp: 30 tablet, Rfl: 0 .  loratadine (CLARITIN) 10 MG tablet, Take 10 mg by mouth 2 (two) times daily., Disp: , Rfl:  .  losartan-hydrochlorothiazide (HYZAAR) 100-12.5 MG tablet, Take 1 tablet by mouth daily., Disp: 90 tablet, Rfl: 3 .  Multiple Vitamins-Minerals (EMERGEN-C IMMUNE PO), Take by mouth., Disp: , Rfl:  .  NONFORMULARY OR COMPOUNDED ITEM, Antifungal solution: Terbinafine 3%, Fluconazole 2%, Tea Tree Oil 5%, Urea 10%, Ibuprofen 2% in DMSO suspension #56mL, Disp: 1 each, Rfl: 3 .  predniSONE (STERAPRED UNI-PAK 21 TAB) 10 MG (21) TBPK tablet, 6 tablets by mouth day 1, 5 tablets day 2, 4 tablet day 3, 3 tablets day 4, 2 tablets day 5, 1 tablet day 6., Disp: 1 each, Rfl: 0 .  PROAIR HFA 108 (90 Base) MCG/ACT inhaler, 2 PUFFS  EVERY 6 HOURS AS NEEDED FOR WHEEZING OR SHORTNESS OF BREATH, Disp: 8.5 g, Rfl: 0  Allergies  Allergen Reactions  . Codeine Nausea And Vomiting  . Levaquin [Levofloxacin] Other (See Comments)    Achilles tendon pain  . Meat [Alpha-Gal]   . Penicillins Other (See Comments)    Does not know  . Praluent [Alirocumab]     Rash and myalgias  . Strawberry (Diagnostic)          Objective:  Physical Exam  General: AAO x3, NAD  Dermatological: Skin is warm, dry and supple bilateral.There are no open sores, no preulcerative lesions, no rash or signs of infection present.  Vascular: Dorsalis Pedis artery and Posterior Tibial artery pedal pulses are 2/4 bilateral with immedate capillary fill time. There is no pain with calf compression, swelling, warmth, erythema.   Neruologic: Grossly intact via light touch bilateral.  Sensation intact with Semmes Weinstein monofilament  Musculoskeletal: Moderate bunion is present bilaterally the right side is symptomatic with shoes and pressure.  Mild erythema along the medial first metatarsal heads bilaterally from irritation from shoe gear but there is no skin breakdown or warmth.  Hammertoe contracture present of the left second toe which is semirigid.  Tenderness to the lateral aspect of left foot on fifth metatarsal base.  There is no pain of the peroneal tendon otherwise.  No other areas of discomfort.  Flexor, extensor tendons appear to be intact.  Muscular strength 5/5 in all groups tested bilateral.  Gait: Unassisted, Nonantalgic.       Assessment:   69 year old female with insertional peroneal tendinitis left foot, symptomatic bunion/hammertoe deformities     Plan:  -Treatment options discussed including all alternatives, risks, and complications -Etiology of symptoms were discussed -X-rays obtained and reviewed I independently reviewed them.  Bunion deformity present also hammertoe.  There is no evidence of acute fracture.  Waiting radiology  report.  1.  Left fifth metatarsal base pain -Likely result of insertional peroneal tendinitis.  There is no significant pain of the cuboid.  Steroid injection performed.  Skin was cleaned with alcohol and a mixture of half cc dexamethasone phosphate, half cc of Marcaine plain was infiltrated into the area of maximal tenderness without complications.  Postinjection care discussed.  Discussion modifications and orthotics  2.  Left second digit symptomatic hammertoe -Dispensed toe crest.  We discussed shoe modifications.  Consider surgical intervention if needed.  3.  Right foot bunion -I reviewed the x-rays with her.  Prescription for conservative as well as surgical treatment options.  For now discussed offloading pads and shoe modifications.  Discussed surgical intervention likely first metatarsal offloading with screw fixation.  She may consider this in the future.  45 minutes were spent with the patient coordinating care and greater than 50% of time was in face-to-face contact.  Trula Slade DPM

## 2019-11-21 ENCOUNTER — Telehealth: Payer: Medicare Other

## 2019-12-23 ENCOUNTER — Telehealth: Payer: Self-pay | Admitting: *Deleted

## 2019-12-23 ENCOUNTER — Ambulatory Visit: Payer: Medicare Other | Admitting: *Deleted

## 2019-12-23 DIAGNOSIS — E782 Mixed hyperlipidemia: Secondary | ICD-10-CM

## 2019-12-23 DIAGNOSIS — I10 Essential (primary) hypertension: Secondary | ICD-10-CM

## 2019-12-23 NOTE — Telephone Encounter (Signed)
Ok to see another provider for acute issue

## 2019-12-23 NOTE — Chronic Care Management (AMB) (Signed)
  Chronic Care Management   Initial Visit Note  12/23/2019 Name: Leah [name] MRN: [id number] DOB: 11/30/1950  Referred by: Leah Norlander, DO Reason for referral : Chronic Care Management (Initial Visit)   Leah [name] is a 70 y.o. year old female who is a primary care patient of Leah Norlander, DO. The CCM team was consulted for assistance with chronic disease management and care coordination needs related to HTN and HLD  Review of patient status, including review of consultants reports, relevant laboratory and other test results was performed prior to outreach.   I spoke with Leah [name] by telephone today regarding management of their chronic medical conditions. They do not have any CCM or resource needs and feel that their medical conditions are well managed. They appreciated the outreach, but do not feel that CCM services are needed at this time. They will reach out in the future if a need arises.   SDOH (Social Determinants of Health) assessments performed: Yes See Care Plan activities for detailed interventions related to SDOH     Plan:  . The patient has been provided with contact information for the care management team and has been advised to call if they would like to re-enroll in the CCM program to receive care management and care coordination services.  . CCM enrollment status changed to "previously enrolled" as per patient request on 12/23/2019  to discontinue enrollment. Case closed to case management services in primary care home.  . Patient was provided with general disease management education and encouraged to follow-up with their PCP and specialists as recommended.   Leah [name], BSN, RN-BC Embedded Chronic Care Manager [facility] [place] Management Direct Dial: [phone]

## 2019-12-23 NOTE — Telephone Encounter (Signed)
12/23/2019  I spoke with Leah [name] today regarding management of her chronic medical conditions. Acutely she complains of posterior left thigh pain x 3 weeks. Describes it like a "charlie horse". The pain is mild constantly but also has episodes of severe cramping. Nothing seems to make it worse and she is drinking vinegar putting mustard under her tongue to decrease the cramping. This may help some. She would like an office visit for evaluation. Advised to call the office if she develops any new or worsening symptoms.   Forwarding to [facility] clinical staff for outreach and scheduling.   Chong Sicilian, BSN, RN-BC Embedded Chronic Care Manager [facility] [place] Management Direct Dial: [phone]

## 2019-12-23 NOTE — Patient Instructions (Signed)
   Plan:  . The patient has been provided with contact information for the care management team and has been advised to call if they would like to re-enroll in the CCM program to receive care management and care coordination services.  . CCM enrollment status changed to "previously enrolled" as per patient request on 12/23/2019  to discontinue enrollment. Case closed to case management services in primary care home.  . Patient was provided with general disease management education and encouraged to follow-up with their PCP and specialists as recommended.   Chong Sicilian, BSN, RN-BC Embedded Chronic Care Manager [facility] / [place] Management Direct Dial: [phone]

## 2019-12-24 NOTE — Telephone Encounter (Signed)
Patient only wants to see Dr Kellie Shropshire first available appt for next week. Patent notified

## 2019-12-30 ENCOUNTER — Telehealth: Payer: Self-pay | Admitting: Family Medicine

## 2019-12-30 DIAGNOSIS — M5136 Other intervertebral disc degeneration, lumbar region: Secondary | ICD-10-CM

## 2019-12-30 DIAGNOSIS — M25561 Pain in right knee: Secondary | ICD-10-CM

## 2019-12-31 ENCOUNTER — Encounter: Payer: Self-pay | Admitting: Family Medicine

## 2019-12-31 ENCOUNTER — Ambulatory Visit: Payer: Medicare Other

## 2019-12-31 ENCOUNTER — Other Ambulatory Visit: Payer: Self-pay

## 2019-12-31 ENCOUNTER — Ambulatory Visit (INDEPENDENT_AMBULATORY_CARE_PROVIDER_SITE_OTHER): Payer: Medicare Other | Admitting: Family Medicine

## 2019-12-31 ENCOUNTER — Ambulatory Visit (INDEPENDENT_AMBULATORY_CARE_PROVIDER_SITE_OTHER): Payer: Medicare Other

## 2019-12-31 VITALS — BP 126/79 | HR 86 | Temp 96.9°F | Ht 64.0 in | Wt 193.0 lb

## 2019-12-31 DIAGNOSIS — E782 Mixed hyperlipidemia: Secondary | ICD-10-CM

## 2019-12-31 DIAGNOSIS — R252 Cramp and spasm: Secondary | ICD-10-CM | POA: Diagnosis not present

## 2019-12-31 DIAGNOSIS — M25561 Pain in right knee: Secondary | ICD-10-CM

## 2019-12-31 MED ORDER — CYCLOBENZAPRINE HCL 5 MG PO TABS
5.0000 mg | ORAL_TABLET | Freq: Three times a day (TID) | ORAL | 1 refills | Status: DC | PRN
Start: 2019-12-31 — End: 2021-02-07

## 2019-12-31 MED ORDER — ROSUVASTATIN CALCIUM 20 MG PO TABS: 20.0000 mg | ORAL_TABLET | Freq: Every day | ORAL | 3 refills | Status: DC

## 2019-12-31 MED ORDER — IBUPROFEN 800 MG PO TABS
800.0000 mg | ORAL_TABLET | Freq: Every evening | ORAL | 0 refills | Status: DC | PRN
Start: 2019-12-31 — End: 2020-08-27

## 2019-12-31 NOTE — Progress Notes (Signed)
Subjective: CC: Thigh pain PCP: Janora Norlander, DO [id number] Leah [name] is a 69 y.o. female presenting to clinic today for:  1.  Thigh pain/ HLD Patient reports left-sided posterior thigh pain that she describes as a cramping in her thigh.  It seems to be fairly consistent but is certainly worse at nighttime.  She is only able to lay on certain positions otherwise her whole body seems to ache.  She does admit that pain seems to be worse since going onto the Lipitor and would like to switch her cholesterol medicine out of possible.  She has found Motrin 800 mg to be somewhat alleviating.  Denies any sensory changes, falls.  2.  Right knee pain Patient reports that she gets stiffness, particularly posteriorly to the right knee.  This waxes and wanes.  She again uses the oral analgesic with some improvement but she worries about the recurrent nature of this.  Denies any preceding injury   ROS: Per HPI  Allergies  Allergen Reactions  . Codeine Nausea And Vomiting  . Levaquin [Levofloxacin] Other (See Comments)    Achilles tendon pain  . Meat [Alpha-Gal]   . Penicillins Other (See Comments)    Does not know  . Praluent [Alirocumab]     Rash and myalgias  . Strawberry (Diagnostic)    Past Medical History:  Diagnosis Date  . Allergy   . Allergy to alpha-gal   . Anxiety   . Arthritis   . Asthma   . CAD (coronary artery disease)   . Colon polyp   . Depression   . Food allergy   . GERD (gastroesophageal reflux disease)   . Hyperlipidemia   . Hypertension   . Urticaria     Current Outpatient Medications:  .  albuterol (PROVENTIL) (2.5 MG/3ML) 0.083% nebulizer solution, Take 3 mLs (2.5 mg total) by nebulization every 6 (six) hours as needed for wheezing or shortness of breath., Disp: 150 mL, Rfl: 6 .  Albuterol Sulfate 2.5 MG/0.5ML NEBU, SMARTSIG:1 Vial(s) Via Nebulizer Every 6 Hours PRN, Disp: , Rfl:  .  aspirin 81 MG tablet, Take 81 mg by mouth daily., Disp: , Rfl:  .   atorvastatin (LIPITOR) 20 MG tablet, Take 1 tablet (20 mg total) by mouth daily., Disp: 90 tablet, Rfl: 3 .  calcium carbonate (TUMS) 500 MG chewable tablet, Chew 1 tablet (200 mg of elemental calcium total) by mouth as needed for indigestion or heartburn., Disp: , Rfl:  .  Cholecalciferol (VITAMIN D) 2000 UNITS tablet, Take 2,000 Units by mouth daily., Disp: , Rfl:  .  EPINEPHrine (EPIPEN 2-PAK) 0.3 mg/0.3 mL IJ SOAJ injection, Inject into the muscle once., Disp: , Rfl:  .  famotidine (PEPCID) 40 MG tablet, Take 1 tablet (40 mg total) by mouth daily., Disp: 90 tablet, Rfl: 3 .  fish oil-omega-3 fatty acids 1000 MG capsule, Take 2 g by mouth daily. , Disp: , Rfl:  .  fluticasone (FLONASE) 50 MCG/ACT nasal spray, Place 2 sprays into both nostrils daily., Disp: 16 g, Rfl: 6 .  ibuprofen (ADVIL) 600 MG tablet, Take 1 tablet (600 mg total) by mouth every 8 (eight) hours as needed., Disp: 30 tablet, Rfl: 0 .  loratadine (CLARITIN) 10 MG tablet, Take 10 mg by mouth 2 (two) times daily., Disp: , Rfl:  .  losartan-hydrochlorothiazide (HYZAAR) 100-12.5 MG tablet, Take 1 tablet by mouth daily., Disp: 90 tablet, Rfl: 3 .  Multiple Vitamins-Minerals (EMERGEN-C IMMUNE PO), Take by mouth., Disp: , Rfl:  .  NONFORMULARY OR COMPOUNDED ITEM, Antifungal solution: Terbinafine 3%, Fluconazole 2%, Tea Tree Oil 5%, Urea 10%, Ibuprofen 2% in DMSO suspension #76mL, Disp: 1 each, Rfl: 3 .  predniSONE (STERAPRED UNI-PAK 21 TAB) 10 MG (21) TBPK tablet, 6 tablets by mouth day 1, 5 tablets day 2, 4 tablet day 3, 3 tablets day 4, 2 tablets day 5, 1 tablet day 6., Disp: 1 each, Rfl: 0 .  PROAIR HFA 108 (90 Base) MCG/ACT inhaler, 2 PUFFS EVERY 6 HOURS AS NEEDED FOR WHEEZING OR SHORTNESS OF BREATH, Disp: 8.5 g, Rfl: 0 Social History   Socioeconomic History  . Marital status: Divorced    Spouse name: Not on file  . Number of children: 3  . Years of education: 12+  . Highest education level: Some college, no degree  Occupational  History  . Occupation: private duty Engineer, manufacturing systems  Tobacco Use  . Smoking status: Former Smoker    Packs/day: 0.50    Years: 28.00    Pack years: 14.00    Types: Cigarettes    Quit date: 01/17/1996    Years since quitting: 23.9  . Smokeless tobacco: Never Used  Vaping Use  . Vaping Use: Never used  Substance and Sexual Activity  . Alcohol use: Yes    Alcohol/week: 2.0 standard drinks    Types: 2 Glasses of wine per week    Comment: occasional use of wine   . Drug use: No  . Sexual activity: Not Currently    Partners: Male    Birth control/protection: Surgical  Other Topics Concern  . Not on file  Social History Narrative  . Not on file   Social Determinants of Health   Financial Resource Strain: Low Risk   . Difficulty of Paying Living Expenses: Not hard at all  Food Insecurity: No Food Insecurity  . Worried About Programme researcher, broadcasting/film/video in the Last Year: Never true  . Ran Out of Food in the Last Year: Never true  Transportation Needs: No Transportation Needs  . Lack of Transportation (Medical): No  . Lack of Transportation (Non-Medical): No  Physical Activity: Insufficiently Active  . Days of Exercise per Week: 7 days  . Minutes of Exercise per Session: 20 min  Stress: Stress Concern Present  . Feeling of Stress : To some extent  Social Connections: Moderately Integrated  . Frequency of Communication with Friends and Family: More than three times a week  . Frequency of Social Gatherings with Friends and Family: More than three times a week  . Attends Religious Services: More than 4 times per year  . Active Member of Clubs or Organizations: Yes  . Attends Banker Meetings: More than 4 times per year  . Marital Status: Divorced  Catering manager Violence: Not At Risk  . Fear of Current or Ex-Partner: No  . Emotionally Abused: No  . Physically Abused: No  . Sexually Abused: No   Family History  Problem Relation Age of Onset  . Heart disease Mother   .  Alzheimer's disease Mother   . Diabetes Mother   . Other Father        deceased age 43 from some sort of GI problems but no malignancy  . Diabetes Sister   . Asthma Sister   . Colon polyps Sister   . Diabetes Brother   . Cancer Cousin        pancreatic  . Myasthenia gravis Daughter   . Diabetes Daughter   . Colon cancer Neg  Hx   . Stomach cancer Neg Hx   . Esophageal cancer Neg Hx   . Rectal cancer Neg Hx     Objective: Office vital signs reviewed. BP 126/79   Pulse 86   Temp (!) 96.9 F (36.1 C) (Temporal)   Ht $R'5\' 4"'Wj$  (1.626 m)   Wt 193 lb (87.5 kg)   BMI 33.13 kg/m   Physical Examination:  General: Awake, alert, well nourished, No acute distress HEENT: Normal; sclera white Extremities: warm, well perfused, No edema, cyanosis or clubbing; +2 pulses bilaterally MSK: ambulating independently  Left thigh: Symmetric to the right thigh.  No gross edema, tenderness to palpation.  Right knee: No significant soft tissue swelling appreciated  Lumbar spine: No midline tenderness palpation.  She does have tenderness palpation over the left lumbosacral region.  No SI tenderness to palpation.  Her range of motion is fairly preserved Neuro: Light touch sensation grossly intact  Assessment/ Plan: 69 y.o. female   Mixed hyperlipidemia - Plan: CMP14+EGFR, Lipid Panel, rosuvastatin (CRESTOR) 20 MG tablet  Muscle cramp - Plan: Magnesium, DG Lumbar Spine 2-3 Views, ibuprofen (ADVIL) 800 MG tablet, cyclobenzaprine (FLEXERIL) 5 MG tablet  Acute pain of right knee - Plan: ibuprofen (ADVIL) 800 MG tablet  Switch atorvastatin to Crestor.  We discussed that if daily dosing is not manageable we could stretch out to every other day dosing.  Check fasting lipid panel and liver function tests today  I think that the cramping in her posterior thigh is likely due to her back but will check for metabolic etiology.  X-rays were ordered.  Advil prescribed.  Flexeril prescribed.  Caution sedation  I  suspect a Baker's cyst in the back of the right knee.  I have prescribed Advil.  We discussed that we may need to consider referral back to her orthopedist pending x-ray results.  She voiced good understanding and was in agreement of plan  No orders of the defined types were placed in this encounter.  No orders of the defined types were placed in this encounter.    Janora Norlander, DO [place] [phone]

## 2019-12-31 NOTE — Patient Instructions (Signed)
You had labs performed today.  You will be contacted with the results of the labs once they are available, usually in the next 3 business days for routine lab work.  If you have an active my chart account, they will be released to your MyChart.  If you prefer to have these labs released to you via telephone, please let us know.  If you had a pap smear or biopsy performed, expect to be contacted in about 7-10 days.   Baker Cyst  A Baker cyst, also called a popliteal cyst, is a growth that forms at the back of the knee. The cyst forms when the fluid-filled sac (bursa) that cushions the knee joint becomes enlarged. What are the causes? In most cases, a Baker cyst results from another knee problem that causes swelling inside the knee. This makes the fluid inside the knee joint (synovial fluid) flow into the bursa behind the knee, causing the bursa to enlarge. What increases the risk? You may be more likely to develop a Baker cyst if you already have a knee problem, such as:  A tear in cartilage that cushions the knee joint (meniscal tear).  A tear in the tissues that connect the bones of the knee joint (ligament tear).  Knee swelling from osteoarthritis, rheumatoid arthritis, or gout. What are the signs or symptoms? The main symptom of this condition is a lump behind the knee. This may be the only symptom of the condition. The lump may be painful, especially when the knee is straightened. If the lump is painful, the pain may come and go. The knee may also be stiff. Symptoms may quickly get more severe if the cyst breaks open (ruptures). If the cyst ruptures, you may feel the following in your knee and calf:  Sudden or worsening pain.  Swelling.  Bruising.  Redness in the calf. A Baker cyst does not always cause symptoms. How is this diagnosed? This condition may be diagnosed based on your symptoms and medical history. Your health care provider will also do a physical exam. This may  include:  Feeling the cyst to check whether it is tender.  Checking your knee for signs of another knee condition that causes swelling. You may have imaging tests, such as:  X-rays.  MRI.  Ultrasound. How is this treated? A Baker cyst that is not painful may go away without treatment. If the cyst gets large or painful, it will likely get better if the underlying knee problem is treated. If needed, treatment for a Baker cyst may include:  Resting.  Keeping weight off of the knee. This means not leaning on the knee to support your body weight.  Taking NSAIDs, such as ibuprofen, to reduce pain and swelling.  Having a procedure to drain the fluid from the cyst with a needle (aspiration). You may also get an injection of a medicine that reduces swelling (steroid).  Having surgery. This may be needed if other treatments do not work. This usually involves correcting knee damage and removing the cyst. Follow these instructions at home:  Activity  Rest as told by your health care provider.  Avoid activities that make pain or swelling worse.  Return to your normal activities as told by your health care provider. Ask your health care provider what activities are safe for you.  Do not use the injured limb to support your body weight until your health care provider says that you can. Use crutches as told by your health care provider. General  instructions  Take over-the-counter and prescription medicines only as told by your health care provider.  Keep all follow-up visits as told by your health care provider. This is important. Contact a health care provider if:  You have knee pain, stiffness, or swelling that does not get better. Get help right away if:  You have sudden or worsening pain and swelling in your calf area. Summary  A Baker cyst, also called a popliteal cyst, is a growth that forms at the back of the knee.  In most cases, a Baker cyst results from another knee  problem that causes swelling inside the knee.  A Baker cyst that is not painful may go away without treatment.  If needed, treatment for a Baker cyst may include resting, keeping weight off of the knee, medicines, or draining fluid from the cyst.  Surgery may be needed if other treatments are not effective. This information is not intended to replace advice given to you by your health care provider. Make sure you discuss any questions you have with your health care provider. Document Revised: 05/17/2018 Document Reviewed: 05/17/2018 Elsevier Patient Education  [employer].

## 2020-01-01 LAB — LIPID PANEL
Chol/HDL Ratio: 3.5 ratio (ref 0.0–4.4)
Cholesterol, Total: 208 mg/dL — ABNORMAL HIGH (ref 100–199)
HDL: 59 mg/dL (ref >39)
LDL Chol Calc (NIH): 132 mg/dL — ABNORMAL HIGH (ref 0–99)
Triglycerides: 96 mg/dL (ref 0–149)
VLDL Cholesterol Cal: 17 mg/dL (ref 5–40)

## 2020-01-01 LAB — CMP14+EGFR
ALT: 20 IU/L (ref 0–32)
AST: 17 IU/L (ref 0–40)
Albumin/Globulin Ratio: 1.9 (ref 1.2–2.2)
Albumin: 4.3 g/dL (ref 3.8–4.8)
Alkaline Phosphatase: 107 IU/L (ref 44–121)
BUN/Creatinine Ratio: 20 (ref 12–28)
BUN: 17 mg/dL (ref 8–27)
Bilirubin Total: 0.4 mg/dL (ref 0.0–1.2)
CO2: 24 mmol/L (ref 20–29)
Calcium: 9.3 mg/dL (ref 8.7–10.3)
Chloride: 107 mmol/L — ABNORMAL HIGH (ref 96–106)
Creatinine, Ser: 0.86 mg/dL (ref 0.57–1.00)
GFR calc Af Amer: 80 mL/min/{1.73_m2} (ref >59)
GFR calc non Af Amer: 70 mL/min/{1.73_m2} (ref >59)
Globulin, Total: 2.3 g/dL (ref 1.5–4.5)
Glucose: 88 mg/dL (ref 65–99)
Potassium: 4.4 mmol/L (ref 3.5–5.2)
Sodium: 144 mmol/L (ref 134–144)
Total Protein: 6.6 g/dL (ref 6.0–8.5)

## 2020-01-01 LAB — MAGNESIUM: Magnesium: 2 mg/dL (ref 1.6–2.3)

## 2020-01-01 NOTE — Telephone Encounter (Signed)
-----   Message from Janora Norlander, DO sent at 12/31/2019  5:02 PM EST ----- Please inform patient that she does have some degenerative changes noted at the L4 on L5 and L5 on S1 facets.  There is some osteophyte formation as well again suggestive of degenerative arthritis. I suspect that these changes may be contributing to the sensation that she is been having in her thigh.  I am glad to place referral back to her orthopedist office if she would like for further evaluation.  Please let me know

## 2020-01-01 NOTE — Progress Notes (Signed)
Patient returned call.  Results were given and patient would like to be referred back to orthopedist.  REFERRAL REQUEST Telephone Note  Have you been seen at our office for this problem? yes (Advise that they may need an appointment with their PCP before a referral can be done)  Reason for Referral:  Thigh Referral discussed with patient: yes  Best contact number of patient for referral team: [phone] or [phone]   Has patient been seen by a specialist for this issue before: yes Patient provider preference for referral: Dr. Onnie Graham Patient location preference for referral:    Patient notified that referrals can take up to a week or longer to process. If they haven't heard anything within a week they should call back and speak with the referral department.

## 2020-01-01 NOTE — Telephone Encounter (Signed)
Patient returned call.  Gave her results of xray and patient states she would like to be referred back to the orthopedist.  REFERRAL REQUEST Telephone Note  Have you been seen at our office for this problem? yes (Advise that they may need an appointment with their PCP before a referral can be done)  Reason for Referral: Thigh Referral discussed with patient: yes Best contact number of patient for referral team: [phone]  / [phone]   Has patient been seen by a specialist for this issue before:  Patient provider preference for referral: Dr. Onnie Graham Patient location preference for referral:    Patient notified that referrals can take up to a week or longer to process. If they haven't heard anything within a week they should call back and speak with the referral department.

## 2020-02-10 DIAGNOSIS — M5459 Other low back pain: Secondary | ICD-10-CM | POA: Diagnosis not present

## 2020-02-10 DIAGNOSIS — M25561 Pain in right knee: Secondary | ICD-10-CM | POA: Diagnosis not present

## 2020-02-12 DIAGNOSIS — M25561 Pain in right knee: Secondary | ICD-10-CM | POA: Diagnosis not present

## 2020-02-12 DIAGNOSIS — M5459 Other low back pain: Secondary | ICD-10-CM | POA: Diagnosis not present

## 2020-02-17 DIAGNOSIS — M25561 Pain in right knee: Secondary | ICD-10-CM | POA: Diagnosis not present

## 2020-02-17 DIAGNOSIS — M5459 Other low back pain: Secondary | ICD-10-CM | POA: Diagnosis not present

## 2020-02-19 DIAGNOSIS — M5459 Other low back pain: Secondary | ICD-10-CM | POA: Diagnosis not present

## 2020-02-19 DIAGNOSIS — M25561 Pain in right knee: Secondary | ICD-10-CM | POA: Diagnosis not present

## 2020-02-24 DIAGNOSIS — M5459 Other low back pain: Secondary | ICD-10-CM | POA: Diagnosis not present

## 2020-02-24 DIAGNOSIS — M25561 Pain in right knee: Secondary | ICD-10-CM | POA: Diagnosis not present

## 2020-02-26 DIAGNOSIS — M25561 Pain in right knee: Secondary | ICD-10-CM | POA: Diagnosis not present

## 2020-02-26 DIAGNOSIS — M5459 Other low back pain: Secondary | ICD-10-CM | POA: Diagnosis not present

## 2020-03-02 DIAGNOSIS — M25561 Pain in right knee: Secondary | ICD-10-CM | POA: Diagnosis not present

## 2020-03-02 DIAGNOSIS — M5459 Other low back pain: Secondary | ICD-10-CM | POA: Diagnosis not present

## 2020-03-04 DIAGNOSIS — M25561 Pain in right knee: Secondary | ICD-10-CM | POA: Diagnosis not present

## 2020-03-04 DIAGNOSIS — M5459 Other low back pain: Secondary | ICD-10-CM | POA: Diagnosis not present

## 2020-03-10 ENCOUNTER — Encounter: Payer: Self-pay | Admitting: Family Medicine

## 2020-03-10 ENCOUNTER — Ambulatory Visit (INDEPENDENT_AMBULATORY_CARE_PROVIDER_SITE_OTHER): Payer: Medicare Other | Admitting: Family Medicine

## 2020-03-10 ENCOUNTER — Other Ambulatory Visit: Payer: Self-pay

## 2020-03-10 ENCOUNTER — Ambulatory Visit (INDEPENDENT_AMBULATORY_CARE_PROVIDER_SITE_OTHER): Payer: Medicare Other

## 2020-03-10 VITALS — BP 127/82 | HR 89 | Temp 97.4°F | Resp 20 | Ht 64.0 in | Wt 195.1 lb

## 2020-03-10 DIAGNOSIS — K279 Peptic ulcer, site unspecified, unspecified as acute or chronic, without hemorrhage or perforation: Secondary | ICD-10-CM | POA: Diagnosis not present

## 2020-03-10 DIAGNOSIS — R109 Unspecified abdominal pain: Secondary | ICD-10-CM

## 2020-03-10 LAB — URINALYSIS, COMPLETE
Bilirubin, UA: NEGATIVE
Glucose, UA: NEGATIVE
Ketones, UA: NEGATIVE
Nitrite, UA: NEGATIVE
Protein,UA: NEGATIVE
RBC, UA: NEGATIVE
Specific Gravity, UA: 1.01 (ref 1.005–1.030)
Urobilinogen, Ur: 0.2 mg/dL (ref 0.2–1.0)
pH, UA: 7 (ref 5.0–7.5)

## 2020-03-10 LAB — MICROSCOPIC EXAMINATION: RBC, Urine: NONE SEEN /hpf (ref 0–2)

## 2020-03-10 MED ORDER — PANTOPRAZOLE SODIUM 40 MG PO TBEC: 40.0000 mg | DELAYED_RELEASE_TABLET | Freq: Every day | ORAL | 11 refills | Status: DC

## 2020-03-10 NOTE — Progress Notes (Signed)
Subjective:  Patient ID: Leah Leah [name], female    DOB: Nov 08, 1950  Age: 70 y.o. MRN: [id number]  CC: Abdominal Pain   HPI Leah Leah [name] presents for 2 weeks of belly pain with gas. Awakening at night with a stomach ache. Radiates to J[place]. Feels like this this morning's oatmeal is sticking in her throat.  Constant gas. Not constipated. No diarrhea. Denies HA, but Leah [name] a little nagging sensation for 2 days. Occipital. No fever. Denies cough. Vomited twice last week. Appetite is good. No diarrhea. Pt. Had remote cholecystectomy. Leah Leah [name] mesh for ventral hernia over anterior abd.   Depression screen [name] 2/9 03/10/2020 12/31/2019 10/28/2019  Decreased Interest 0 0 0  Down, Depressed, Hopeless 0 0 0  PHQ - 2 Score 0 0 0  Altered sleeping - 0 -  Tired, decreased energy - 0 -  Change in appetite - 0 -  Feeling bad or failure about yourself  - 0 -  Trouble concentrating - 0 -  Moving slowly or fidgety/restless - 0 -  Suicidal thoughts - 0 -  PHQ-9 Score - 0 -  Difficult doing work/chores - - -  Some recent data might be hidden    History Leah Leah [name] Leah [name] a past medical history of Allergy, Allergy to alpha-gal, Anxiety, Arthritis, Asthma, CAD (coronary artery disease), Colon polyp, Depression, Food allergy, GERD (gastroesophageal reflux disease), Hyperlipidemia, Hypertension, and Urticaria.   Leah Leah [name] a past surgical history that includes Abdominal hysterectomy; Cholecystectomy; Hernia repair; Carpel Tunnel; Tubal ligation; Colonoscopy (02/08/2005); Nissen fundoplication (6063); Colonoscopy with esophagogastroduodenoscopy (egd) (N/A, 11/29/2012); Cyst removal trunk; Polypectomy; Hernia repair; and Rotator cuff repair (Left, 04/04/2019).   Her family history includes Alzheimer's disease in her mother; Asthma in her sister; Cancer in her cousin; Colon polyps in her sister; Diabetes in her brother, daughter, mother, and sister; Heart disease in her mother; Myasthenia gravis in her daughter; Other in  her father.Leah reports that Leah quit smoking about 24 years ago. Her smoking use included cigarettes. Leah Leah [name] a 14.00 pack-year smoking history. Leah Leah [name] never used smokeless tobacco. Leah reports current alcohol use of about 2.0 standard drinks of alcohol per week. Leah reports that Leah does not use drugs.    ROS Review of Systems  Constitutional: Negative.  Negative for activity change and appetite change.  HENT: Positive for trouble swallowing (food sticks in throat at times).   Eyes: Negative for visual disturbance.  Respiratory: Negative for shortness of breath.   Cardiovascular: Negative for chest pain.  Gastrointestinal: Positive for abdominal pain, nausea and vomiting. Negative for constipation and diarrhea.  Musculoskeletal: Negative for arthralgias.    Objective:  BP 127/82   Pulse 89   Temp (!) 97.4 F (36.3 C) (Temporal)   Resp 20   Ht $R'5\' 4"'Iu$  (1.626 m)   Wt 195 lb 2 oz (88.5 kg)   SpO2 99%   BMI 33.49 kg/m   BP Readings from Last 3 Encounters:  03/10/20 127/82  12/31/19 126/79  10/28/19 123/80    Wt Readings from Last 3 Encounters:  03/10/20 195 lb 2 oz (88.5 kg)  12/31/19 193 lb (87.5 kg)  10/28/19 196 lb (88.9 kg)     Physical Exam Constitutional:      Appearance: Leah is well-developed and well-nourished.  HENT:     Head: Normocephalic and atraumatic.  Cardiovascular:     Rate and Rhythm: Normal rate and regular rhythm.     Heart sounds: No murmur heard.   Pulmonary:  Effort: Pulmonary effort is normal.     Breath sounds: Normal breath sounds.  Abdominal:     General: Bowel sounds are normal.     Palpations: Abdomen is soft. There is no mass.     Tenderness: There is abdominal tenderness in the epigastric area and left upper quadrant. There is no guarding or rebound.  Skin:    General: Skin is warm and dry.  Neurological:     Mental Status: Leah is alert and oriented to person, place, and time.  Psychiatric:        Mood and Affect: Mood and  affect normal.        Behavior: Behavior normal.    Flat and upright abdomen are unremarkable, no acute disease, normal bowel gas pattern. Preliminary reading done by Randell Loop     Assessment & Plan:   Ranata was seen today for abdominal pain.  Diagnoses and all orders for this visit:  Abdominal pain, unspecified abdominal location -     Urinalysis, Complete -     Urine Culture -     CBC with Differential/Platelet -     CMP14+EGFR -     Amylase -     Lipase -     DG Abd 2 Views; Future  PUD (peptic ulcer disease) -     pantoprazole (PROTONIX) 40 MG tablet; Take 1 tablet (40 mg total) by mouth daily. For stomach       I have discontinued Shelanda Duvall. Meyn's predniSONE and NONFORMULARY OR COMPOUNDED ITEM. I am also having her start on pantoprazole. Additionally, I am having her maintain her aspirin, fish oil-omega-3 fatty acids, Vitamin D, calcium carbonate, EPINEPHrine, loratadine, albuterol, ProAir HFA, fluticasone, Albuterol Sulfate, Multiple Vitamins-Minerals (EMERGEN-C IMMUNE PO), famotidine, losartan-hydrochlorothiazide, rosuvastatin, ibuprofen, and cyclobenzaprine.  Allergies as of 03/10/2020      Reactions   Codeine Nausea And Vomiting   Levaquin [levofloxacin] Other (See Comments)   Achilles tendon pain   Meat [alpha-gal]    Penicillins Other (See Comments)   Does not know   Praluent [alirocumab]    Rash and myalgias   Strawberry (diagnostic)       Medication List       Accurate as of March 10, 2020 12:37 PM. If you have any questions, ask your nurse or doctor.        STOP taking these medications   NONFORMULARY OR COMPOUNDED ITEM Stopped by: Claretta Fraise, MD   predniSONE 10 MG (21) Tbpk tablet Commonly known as: STERAPRED UNI-PAK 21 TAB Stopped by: Claretta Fraise, MD     TAKE these medications   albuterol (2.5 MG/3ML) 0.083% nebulizer solution Commonly known as: PROVENTIL Take 3 mLs (2.5 mg total) by nebulization every 6 (six) hours  as needed for wheezing or shortness of breath.   ProAir HFA 108 (90 Base) MCG/ACT inhaler Generic drug: albuterol 2 PUFFS EVERY 6 HOURS AS NEEDED FOR WHEEZING OR SHORTNESS OF BREATH   Albuterol Sulfate 2.5 MG/0.5ML Nebu SMARTSIG:1 Vial(s) Via Nebulizer Every 6 Hours PRN   aspirin 81 MG tablet Take 81 mg by mouth daily.   calcium carbonate 500 MG chewable tablet Commonly known as: Tums Chew 1 tablet (200 mg of elemental calcium total) by mouth as needed for indigestion or heartburn.   cyclobenzaprine 5 MG tablet Commonly known as: FLEXERIL Take 1 tablet (5 mg total) by mouth 3 (three) times daily as needed for muscle spasms.   EMERGEN-C IMMUNE PO Take by mouth.   EPINEPHrine 0.3 mg/0.3 mL  Soaj injection Commonly known as: EPI-PEN Inject into the muscle once.   famotidine 40 MG tablet Commonly known as: PEPCID Take 1 tablet (40 mg total) by mouth daily.   fish oil-omega-3 fatty acids 1000 MG capsule Take 2 g by mouth daily.   fluticasone 50 MCG/ACT nasal spray Commonly known as: FLONASE Place 2 sprays into both nostrils daily.   ibuprofen 800 MG tablet Commonly known as: ADVIL Take 1 tablet (800 mg total) by mouth at bedtime as needed for moderate pain or cramping.   loratadine 10 MG tablet Commonly known as: CLARITIN Take 10 mg by mouth 2 (two) times daily.   losartan-hydrochlorothiazide 100-12.5 MG tablet Commonly known as: HYZAAR Take 1 tablet by mouth daily.   pantoprazole 40 MG tablet Commonly known as: PROTONIX Take 1 tablet (40 mg total) by mouth daily. For stomach Started by: Claretta Fraise, MD   rosuvastatin 20 MG tablet Commonly known as: Crestor Take 1 tablet (20 mg total) by mouth daily.   Vitamin D 50 MCG (2000 UT) tablet Take 2,000 Units by mouth daily.        Follow-up: No follow-ups on file.  Claretta Fraise, M.D.

## 2020-03-11 LAB — CBC WITH DIFFERENTIAL/PLATELET
Basophils Absolute: 0 10*3/uL (ref 0.0–0.2)
Basos: 1 %
EOS (ABSOLUTE): 0.1 10*3/uL (ref 0.0–0.4)
Eos: 3 %
Hematocrit: 39.8 % (ref 34.0–46.6)
Hemoglobin: 13 g/dL (ref 11.1–15.9)
Immature Grans (Abs): 0 10*3/uL (ref 0.0–0.1)
Immature Granulocytes: 0 %
Lymphocytes Absolute: 1.2 10*3/uL (ref 0.7–3.1)
Lymphs: 37 %
MCH: 27.1 pg (ref 26.6–33.0)
MCHC: 32.7 g/dL (ref 31.5–35.7)
MCV: 83 fL (ref 79–97)
Monocytes Absolute: 0.3 10*3/uL (ref 0.1–0.9)
Monocytes: 10 %
Neutrophils Absolute: 1.6 10*3/uL (ref 1.4–7.0)
Neutrophils: 49 %
Platelets: 277 10*3/uL (ref 150–450)
RBC: 4.79 x10E6/uL (ref 3.77–5.28)
RDW: 13.7 % (ref 11.7–15.4)
WBC: 3.2 10*3/uL — ABNORMAL LOW (ref 3.4–10.8)

## 2020-03-11 LAB — CMP14+EGFR
ALT: 30 IU/L (ref 0–32)
AST: 24 IU/L (ref 0–40)
Albumin/Globulin Ratio: 2 (ref 1.2–2.2)
Albumin: 4.2 g/dL (ref 3.8–4.8)
Alkaline Phosphatase: 107 IU/L (ref 44–121)
BUN/Creatinine Ratio: 13 (ref 12–28)
BUN: 12 mg/dL (ref 8–27)
Bilirubin Total: 0.2 mg/dL (ref 0.0–1.2)
CO2: 25 mmol/L (ref 20–29)
Calcium: 9.1 mg/dL (ref 8.7–10.3)
Chloride: 104 mmol/L (ref 96–106)
Creatinine, Ser: 0.96 mg/dL (ref 0.57–1.00)
GFR calc Af Amer: 70 mL/min/{1.73_m2} (ref >59)
GFR calc non Af Amer: 61 mL/min/{1.73_m2} (ref >59)
Globulin, Total: 2.1 g/dL (ref 1.5–4.5)
Glucose: 107 mg/dL — ABNORMAL HIGH (ref 65–99)
Potassium: 4.1 mmol/L (ref 3.5–5.2)
Sodium: 143 mmol/L (ref 134–144)
Total Protein: 6.3 g/dL (ref 6.0–8.5)

## 2020-03-11 LAB — AMYLASE: Amylase: 173 U/L — ABNORMAL HIGH (ref 31–110)

## 2020-03-11 LAB — LIPASE: Lipase: 28 U/L (ref 14–72)

## 2020-03-12 LAB — URINE CULTURE

## 2020-03-15 ENCOUNTER — Telehealth: Payer: Self-pay | Admitting: Family Medicine

## 2020-03-15 NOTE — Telephone Encounter (Signed)
Pt calling about x ray and labs that were completed on 2/23 - no notes in chart about either. Would like to talk to someone that can review these with her.

## 2020-03-15 NOTE — Telephone Encounter (Signed)
Patient show Leah [name] 03/10/20.   Please review and advise

## 2020-03-15 NOTE — Telephone Encounter (Signed)
See the lab review notes that I generated for this.

## 2020-03-16 NOTE — Telephone Encounter (Signed)
Left message to call back  

## 2020-03-24 NOTE — Telephone Encounter (Signed)
Results have been discussed with patient previously

## 2020-04-05 ENCOUNTER — Other Ambulatory Visit: Payer: Self-pay

## 2020-04-05 ENCOUNTER — Encounter: Payer: Self-pay | Admitting: Family Medicine

## 2020-04-05 ENCOUNTER — Ambulatory Visit (INDEPENDENT_AMBULATORY_CARE_PROVIDER_SITE_OTHER): Payer: Medicare Other | Admitting: Family Medicine

## 2020-04-05 VITALS — BP 123/74 | HR 70 | Temp 98.4°F | Resp 20 | Ht 64.0 in | Wt 192.0 lb

## 2020-04-05 DIAGNOSIS — G8929 Other chronic pain: Secondary | ICD-10-CM

## 2020-04-05 DIAGNOSIS — Z1211 Encounter for screening for malignant neoplasm of colon: Secondary | ICD-10-CM | POA: Diagnosis not present

## 2020-04-05 DIAGNOSIS — E782 Mixed hyperlipidemia: Secondary | ICD-10-CM | POA: Diagnosis not present

## 2020-04-05 DIAGNOSIS — Z9889 Other specified postprocedural states: Secondary | ICD-10-CM | POA: Diagnosis not present

## 2020-04-05 DIAGNOSIS — I10 Essential (primary) hypertension: Secondary | ICD-10-CM | POA: Diagnosis not present

## 2020-04-05 DIAGNOSIS — M25511 Pain in right shoulder: Secondary | ICD-10-CM

## 2020-04-05 DIAGNOSIS — Z0000 Encounter for general adult medical examination without abnormal findings: Secondary | ICD-10-CM

## 2020-04-05 DIAGNOSIS — Z806 Family history of leukemia: Secondary | ICD-10-CM

## 2020-04-05 DIAGNOSIS — R1013 Epigastric pain: Secondary | ICD-10-CM | POA: Diagnosis not present

## 2020-04-05 DIAGNOSIS — Z800 Family history of malignant neoplasm of digestive organs: Secondary | ICD-10-CM

## 2020-04-05 DIAGNOSIS — Z23 Encounter for immunization: Secondary | ICD-10-CM

## 2020-04-05 DIAGNOSIS — R112 Nausea with vomiting, unspecified: Secondary | ICD-10-CM

## 2020-04-05 MED ORDER — FAMOTIDINE 40 MG PO TABS: 40.0000 mg | ORAL_TABLET | Freq: Every day | ORAL | 3 refills | Status: DC

## 2020-04-05 NOTE — Progress Notes (Signed)
Leah [name] is a 70 y.o. female presents to office today for annual physical exam examination.    Concerns today include: 1.  Epigastric pain Patient reports that she has been having ongoing epigastric pain that seems to be getting worse.  She reports that sometimes it so severe that it causes her to become nauseated and vomit.  Denies any blood in stool, unplanned weight loss.  She is compliant with her Pepcid and PPI.  She has not seen her gastroenterologist for this but notes history of polyps on previous colonoscopy.  Medical history significant for GERD, Nissen fundoplication.  Family history significant for pancreatic cancer in 2 first cousins, both if you develop this in their 71s.  Her brother was recently diagnosed with leukemia (CLL).  Avoids NSAID use because this exacerbates GERD.  2.  Right knee pain Patient with ongoing right knee pain.  This has been present for about 3 weeks.  She status post fluid removal with her orthopedist.  She feels that symptoms seem to be worse going upstairs.  She has not followed up with him again because they were hoping that the treatment would resolve issue.  3.  Right shoulder pain Patient with history of left shoulder repair with Dr. Onnie Graham.  She had right shoulder pain at that time but it was less severe than the left.  She had some type of treatment done while she was under for the left shoulder and that seemed to hold the pain pretty well for a while.  Most recently she started developing the pain again and she finds it is painful when she rolls over it whilst in bed.  She reports pain with certain movements.  Advil does not relieve.  She uses it sparingly.  Diet: Fair, Exercise: No structured Last eye exam: Up-to-date Last colonoscopy: Needs Last mammogram: Up-to-date Last pap smear: N/A Refills needed today: Pepcid Immunizations needed: Needs pneumococcal  Past Medical History:  Diagnosis Date  . Allergy   . Allergy to alpha-gal    . Anxiety   . Arthritis   . Asthma   . CAD (coronary artery disease)   . Colon polyp   . Depression   . Food allergy   . GERD (gastroesophageal reflux disease)   . Hyperlipidemia   . Hypertension   . Urticaria    Social History   Socioeconomic History  . Marital status: Divorced    Spouse name: Not on file  . Number of children: 3  . Years of education: 12+  . Highest education level: Some college, no degree  Occupational History  . Occupation: private duty Manufacturing systems engineer  Tobacco Use  . Smoking status: Former Smoker    Packs/day: 0.50    Years: 28.00    Pack years: 14.00    Types: Cigarettes    Quit date: 01/17/1996    Years since quitting: 24.2  . Smokeless tobacco: Never Used  Vaping Use  . Vaping Use: Never used  Substance and Sexual Activity  . Alcohol use: Yes    Alcohol/week: 2.0 standard drinks    Types: 2 Glasses of wine per week    Comment: occasional use of wine   . Drug use: No  . Sexual activity: Not Currently    Partners: Male    Birth control/protection: Surgical  Other Topics Concern  . Not on file  Social History Narrative  . Not on file   Social Determinants of Health   Financial Resource Strain: Low Risk   .  Difficulty of Paying Living Expenses: Not hard at all  Food Insecurity: No Food Insecurity  . Worried About Charity fundraiser in the Last Year: Never true  . Ran Out of Food in the Last Year: Never true  Transportation Needs: No Transportation Needs  . Lack of Transportation (Medical): No  . Lack of Transportation (Non-Medical): No  Physical Activity: Not on file  Stress: Not on file  Social Connections: Moderately Integrated  . Frequency of Communication with Friends and Family: More than three times a week  . Frequency of Social Gatherings with Friends and Family: More than three times a week  . Attends Religious Services: More than 4 times per year  . Active Member of Clubs or Organizations: Yes  . Attends Theatre manager Meetings: More than 4 times per year  . Marital Status: Divorced  Human resources officer Violence: Not on file   Past Surgical History:  Procedure Laterality Date  . ABDOMINAL HYSTERECTOMY    . Carpel Tunnel    . CHOLECYSTECTOMY    . COLONOSCOPY  02/08/2005   [id number] hemorrhoids otherwise normal rectum/ A few scattered, shallow, left-sided sigmoid diverticula/The remainder of the colonic mucosa appeared normal  . COLONOSCOPY WITH ESOPHAGOGASTRODUODENOSCOPY (EGD) N/A 11/29/2012   Procedure: COLONOSCOPY WITH ESOPHAGOGASTRODUODENOSCOPY (EGD);  Surgeon: Daneil Dolin, MD;  Location: [facility];  Service: Endoscopy;  Laterality: N/A;  10:45  . CYST REMOVAL TRUNK    . HERNIA REPAIR     ventral X 2 with mesh, last one 2011 ([place]). complicated with 2 week hospital stay  . HERNIA REPAIR     with mesh  . NISSEN FUNDOPLICATION  9629  . POLYPECTOMY    . ROTATOR CUFF REPAIR Left 04/04/2019  . TUBAL LIGATION     Family History  Problem Relation Age of Onset  . Heart disease Mother   . Alzheimer's disease Mother   . Diabetes Mother   . Other Father        deceased age 11 from some sort of GI problems but no malignancy  . Diabetes Sister   . Leukemia Brother        CLL  . Asthma Sister   . Colon polyps Sister   . Diabetes Brother   . Cancer Cousin        pancreatic  . Myasthenia gravis Daughter   . Diabetes Daughter   . Cancer Cousin        pancreatic  . Colon cancer Neg Hx   . Stomach cancer Neg Hx   . Esophageal cancer Neg Hx   . Rectal cancer Neg Hx     Current Outpatient Medications:  .  albuterol (PROVENTIL) (2.5 MG/3ML) 0.083% nebulizer solution, Take 3 mLs (2.5 mg total) by nebulization every 6 (six) hours as needed for wheezing or shortness of breath., Disp: 150 mL, Rfl: 6 .  Albuterol Sulfate 2.5 MG/0.5ML NEBU, SMARTSIG:1 Vial(s) Via Nebulizer Every 6 Hours PRN, Disp: , Rfl:  .  aspirin 81 MG tablet, Take 81 mg by mouth daily., Disp: , Rfl:  .   calcium carbonate (TUMS) 500 MG chewable tablet, Chew 1 tablet (200 mg of elemental calcium total) by mouth as needed for indigestion or heartburn., Disp: , Rfl:  .  Cholecalciferol (VITAMIN D) 2000 UNITS tablet, Take 2,000 Units by mouth daily., Disp: , Rfl:  .  cyclobenzaprine (FLEXERIL) 5 MG tablet, Take 1 tablet (5 mg total) by mouth 3 (three) times daily as needed for muscle spasms., Disp:  30 tablet, Rfl: 1 .  EPINEPHrine 0.3 mg/0.3 mL IJ SOAJ injection, Inject into the muscle once., Disp: , Rfl:  .  fish oil-omega-3 fatty acids 1000 MG capsule, Take 2 g by mouth daily. , Disp: , Rfl:  .  fluticasone (FLONASE) 50 MCG/ACT nasal spray, Place 2 sprays into both nostrils daily., Disp: 16 g, Rfl: 6 .  ibuprofen (ADVIL) 800 MG tablet, Take 1 tablet (800 mg total) by mouth at bedtime as needed for moderate pain or cramping., Disp: 30 tablet, Rfl: 0 .  loratadine (CLARITIN) 10 MG tablet, Take 10 mg by mouth 2 (two) times daily., Disp: , Rfl:  .  losartan-hydrochlorothiazide (HYZAAR) 100-12.5 MG tablet, Take 1 tablet by mouth daily., Disp: 90 tablet, Rfl: 3 .  Multiple Vitamins-Minerals (EMERGEN-C IMMUNE PO), Take by mouth., Disp: , Rfl:  .  pantoprazole (PROTONIX) 40 MG tablet, Take 1 tablet (40 mg total) by mouth daily. For stomach, Disp: 30 tablet, Rfl: 11 .  PROAIR HFA 108 (90 Base) MCG/ACT inhaler, 2 PUFFS EVERY 6 HOURS AS NEEDED FOR WHEEZING OR SHORTNESS OF BREATH, Disp: 8.5 g, Rfl: 0 .  rosuvastatin (CRESTOR) 20 MG tablet, Take 1 tablet (20 mg total) by mouth daily., Disp: 90 tablet, Rfl: 3 .  famotidine (PEPCID) 40 MG tablet, Take 1 tablet (40 mg total) by mouth daily., Disp: 90 tablet, Rfl: 3  Allergies  Allergen Reactions  . Codeine Nausea And Vomiting  . Levaquin [Levofloxacin] Other (See Comments)    Achilles tendon pain  . Meat [Alpha-Gal]   . Penicillins Other (See Comments)    Does not know  . Praluent [Alirocumab]     Rash and myalgias  . Strawberry (Diagnostic)       ROS: Review of Systems Pertinent items noted in HPI and remainder of comprehensive ROS otherwise negative.    Physical exam BP 123/74   Pulse 70   Temp 98.4 F (36.9 C)   Resp 20   Ht 5\' 4"  (1.626 m)   Wt 192 lb (87.1 kg)   SpO2 100%   BMI 32.96 kg/m  General appearance: alert, cooperative, appears stated age and no distress Head: Normocephalic, without obvious abnormality, atraumatic Eyes: negative findings: lids and lashes normal, conjunctivae and sclerae normal, corneas clear and pupils equal, round, reactive to light and accomodation Ears: normal TM's and external ear canals both ears Nose: Nares normal. Septum midline. Mucosa normal. No drainage or sinus tenderness. Throat: lips, mucosa, and tongue normal; teeth and gums normal Neck: no adenopathy, supple, symmetrical, trachea midline and thyroid not enlarged, symmetric, no tenderness/mass/nodules Back: symmetric, no curvature. ROM normal. No CVA tenderness., Painful arc sign noted in the right upper extremity. Lungs: clear to auscultation bilaterally Heart: regular rate and rhythm, S1, S2 normal, no murmur, click, rub or gallop Abdomen: Epigastric tenderness palpation present.  Abdomen is soft.  No palpable masses. Extremities: extremities normal, atraumatic, no cyanosis or edema Pulses: 2+ and symmetric Lymph nodes: Cervical, supraclavicular, and axillary nodes normal. Neurologic: Alert and oriented X 3, normal strength and tone. Normal symmetric reflexes. Normal coordination and gait Psych: Mood stable.  Speech normal.   Assessment/ Plan: [name] here for annual physical exam.   Annual physical exam  Screen for colon cancer - Plan: Fecal occult blood, imunochemical  Essential hypertension  Mixed hyperlipidemia - Plan: Lipid Panel  Epigastric pain - Plan: Ambulatory referral to Gastroenterology, Lipase, CT Abdomen Pelvis W Contrast, Amylase, CANCELED: CBC with Differential  Family history of  pancreatic cancer -  Plan: Ambulatory referral to Gastroenterology, CT Abdomen Pelvis W Contrast, CANCELED: CBC with Differential  History of Nissen fundoplication - Plan: Ambulatory referral to Gastroenterology, CT Abdomen Pelvis W Contrast  Non-intractable vomiting with nausea, unspecified vomiting type - Plan: CT Abdomen Pelvis W Contrast  Family history of CLL (chronic lymphoid leukemia) - Plan: Pathologist smear review, CANCELED: CBC with Differential  Chronic right shoulder pain - Plan: Ambulatory referral to Orthopedic Surgery  FOBT obtained but referral to gastroenterology also placed.  Blood pressure is normal  Check fasting lipid  Epigastric pain may be due to history of GERD/Nissen fundoplication.  I think less likely pancreatic cancer.  Will obtain CT abdomen pelvis given history of Nissen to ensure that there is not been any compromise of that.  I have referred her back to gastroenterology for further evaluation management.  Pap smear review ordered given history of leukopenia and now new diagnosis of CLL in her brother.  May need to consider referral back to hematology/oncology  New referral placed to Dr. Onnie Graham for patient's right shoulder.  Counseled on healthy lifestyle choices, including diet (rich in fruits, vegetables and lean meats and low in salt and simple carbohydrates) and exercise (at least 30 minutes of moderate physical activity daily).  Patient to follow up in 1 year for annual exam or sooner if needed.  Ashly M. Lajuana Ripple, DO

## 2020-04-05 NOTE — Patient Instructions (Addendum)
You had labs performed today.  You will be contacted with the results of the labs once they are available, usually in the next 3 business days for routine lab work.  If you have an active my chart account, they will be released to your MyChart.  If you prefer to have these labs released to you via telephone, please let us know.  If you had a pap smear or biopsy performed, expect to be contacted in about 7-10 days.  Preventive Care 65 Years and Older, Female Preventive care refers to lifestyle choices and visits with your health care provider that can promote health and wellness. This includes:  A yearly physical exam. This is also called an annual wellness visit.  Regular dental and eye exams.  Immunizations.  Screening for certain conditions.  Healthy lifestyle choices, such as: ? Eating a healthy diet. ? Getting regular exercise. ? Not using drugs or products that contain nicotine and tobacco. ? Limiting alcohol use. What can I expect for my preventive care visit? Physical exam Your health care provider will check your:  Height and weight. These may be used to calculate your BMI (body mass index). BMI is a measurement that tells if you are at a healthy weight.  Heart rate and blood pressure.  Body temperature.  Skin for abnormal spots. Counseling Your health care provider may ask you questions about your:  Past medical problems.  Family's medical history.  Alcohol, tobacco, and drug use.  Emotional well-being.  Home life and relationship well-being.  Sexual activity.  Diet, exercise, and sleep habits.  History of falls.  Memory and ability to understand (cognition).  Work and work environment.  Pregnancy and menstrual history.  Access to firearms. What immunizations do I need? Vaccines are usually given at various ages, according to a schedule. Your health care provider will recommend vaccines for you based on your age, medical history, and lifestyle or  other factors, such as travel or where you work.   What tests do I need? Blood tests  Lipid and cholesterol levels. These may be checked every 5 years, or more often depending on your overall health.  Hepatitis C test.  Hepatitis B test. Screening  Lung cancer screening. You may have this screening every year starting at age 55 if you have a 30-pack-year history of smoking and currently smoke or have quit within the past 15 years.  Colorectal cancer screening. ? All adults should have this screening starting at age 50 and continuing until age 75. ? Your health care provider may recommend screening at age 45 if you are at increased risk. ? You will have tests every 1-10 years, depending on your results and the type of screening test.  Diabetes screening. ? This is done by checking your blood sugar (glucose) after you have not eaten for a while (fasting). ? You may have this done every 1-3 years.  Mammogram. ? This may be done every 1-2 years. ? Talk with your health care provider about how often you should have regular mammograms.  Abdominal aortic aneurysm (AAA) screening. You may need this if you are a current or former smoker.  BRCA-related cancer screening. This may be done if you have a family history of breast, ovarian, tubal, or peritoneal cancers. Other tests  STD (sexually transmitted disease) testing, if you are at risk.  Bone density scan. This is done to screen for osteoporosis. You may have this done starting at age 65. Talk with your health care provider   about your test results, treatment options, and if necessary, the need for more tests. Follow these instructions at home: Eating and drinking  Eat a diet that includes fresh fruits and vegetables, whole grains, lean protein, and low-fat dairy products. Limit your intake of foods with high amounts of sugar, saturated fats, and salt.  Take vitamin and mineral supplements as recommended by your health care  provider.  Do not drink alcohol if your health care provider tells you not to drink.  If you drink alcohol: ? Limit how much you have to 0-1 drink a day. ? Be aware of how much alcohol is in your drink. In the [place] one drink equals one 12 oz bottle of beer (355 mL), one 5 oz glass of wine (148 mL), or one 1 oz glass of hard liquor (44 mL).   Lifestyle  Take daily care of your teeth and gums. Brush your teeth every morning and night with fluoride toothpaste. Floss one time each day.  Stay active. Exercise for at least 30 minutes 5 or more days each week.  Do not use any products that contain nicotine or tobacco, such as cigarettes, e-cigarettes, and chewing tobacco. If you need help quitting, ask your health care provider.  Do not use drugs.  If you are sexually active, practice safe sex. Use a condom or other form of protection in order to prevent STIs (sexually transmitted infections).  Talk with your health care provider about taking a low-dose aspirin or statin.  Find healthy ways to cope with stress, such as: ? Meditation, yoga, or listening to music. ? Journaling. ? Talking to a trusted person. ? Spending time with friends and family. Safety  Always wear your seat belt while driving or riding in a vehicle.  Do not drive: ? If you have been drinking alcohol. Do not ride with someone who has been drinking. ? When you are tired or distracted. ? While texting.  Wear a helmet and other protective equipment during sports activities.  If you have firearms in your house, make sure you follow all gun safety procedures. What's next?  Visit your health care provider once a year for an annual wellness visit.  Ask your health care provider how often you should have your eyes and teeth checked.  Stay up to date on all vaccines. This information is not intended to replace advice given to you by your health care provider. Make sure you discuss any questions you have with your  health care provider. Document Revised: 12/24/2019 Document Reviewed: 12/27/2017 Elsevier Patient Education  2021 Elsevier Inc.  

## 2020-04-06 ENCOUNTER — Telehealth: Payer: Self-pay | Admitting: Family Medicine

## 2020-04-06 LAB — LIPID PANEL
Chol/HDL Ratio: 3 ratio (ref 0.0–4.4)
Cholesterol, Total: 162 mg/dL (ref 100–199)
HDL: 54 mg/dL (ref >39)
LDL Chol Calc (NIH): 93 mg/dL (ref 0–99)
Triglycerides: 78 mg/dL (ref 0–149)
VLDL Cholesterol Cal: 15 mg/dL (ref 5–40)

## 2020-04-06 LAB — AMYLASE: Amylase: 126 U/L — ABNORMAL HIGH (ref 31–110)

## 2020-04-06 LAB — LIPASE: Lipase: 23 U/L (ref 14–72)

## 2020-04-07 LAB — PATHOLOGIST SMEAR REVIEW
Basophils Absolute: 0 10*3/uL (ref 0.0–0.2)
Basos: 1 %
EOS (ABSOLUTE): 0.1 10*3/uL (ref 0.0–0.4)
Eos: 2 %
Hematocrit: 38.8 % (ref 34.0–46.6)
Hemoglobin: 12.7 g/dL (ref 11.1–15.9)
Immature Grans (Abs): 0 10*3/uL (ref 0.0–0.1)
Immature Granulocytes: 0 %
Lymphocytes Absolute: 1.2 10*3/uL (ref 0.7–3.1)
Lymphs: 37 %
MCH: 27.3 pg (ref 26.6–33.0)
MCHC: 32.7 g/dL (ref 31.5–35.7)
MCV: 83 fL (ref 79–97)
Monocytes Absolute: 0.3 10*3/uL (ref 0.1–0.9)
Monocytes: 9 %
Neutrophils Absolute: 1.6 10*3/uL (ref 1.4–7.0)
Neutrophils: 51 %
Path Rev PLTs: NORMAL
Path Rev RBC: NORMAL
Platelets: 294 10*3/uL (ref 150–450)
RBC: 4.65 x10E6/uL (ref 3.77–5.28)
RDW: 13.2 % (ref 11.7–15.4)
WBC: 3.2 10*3/uL — ABNORMAL LOW (ref 3.4–10.8)

## 2020-04-07 NOTE — Telephone Encounter (Signed)
Pt called back to get referral info. Gave pt the date/time/location of CT appt. Pt says she does not want to go to [facility]. Please schedule her for CT anywhere besides [facility].  Call pt with new appt info.

## 2020-04-15 ENCOUNTER — Ambulatory Visit (HOSPITAL_COMMUNITY)
Admission: RE | Admit: 2020-04-15 | Discharge: 2020-04-15 | Disposition: A | Discharge status: Home / Self Care | Payer: Medicare Other | Source: Ambulatory Visit | Attending: Family Medicine | Admitting: Family Medicine

## 2020-04-15 ENCOUNTER — Other Ambulatory Visit: Payer: Self-pay

## 2020-04-15 ENCOUNTER — Encounter (HOSPITAL_COMMUNITY): Payer: Self-pay

## 2020-04-15 DIAGNOSIS — Z9889 Other specified postprocedural states: Secondary | ICD-10-CM | POA: Diagnosis not present

## 2020-04-15 DIAGNOSIS — R112 Nausea with vomiting, unspecified: Secondary | ICD-10-CM | POA: Diagnosis not present

## 2020-04-15 DIAGNOSIS — R1013 Epigastric pain: Secondary | ICD-10-CM

## 2020-04-15 DIAGNOSIS — Z800 Family history of malignant neoplasm of digestive organs: Secondary | ICD-10-CM | POA: Insufficient documentation

## 2020-04-15 DIAGNOSIS — R1110 Vomiting, unspecified: Secondary | ICD-10-CM | POA: Diagnosis not present

## 2020-04-15 LAB — POCT I-STAT CREATININE: Creatinine, Ser: 1 mg/dL (ref 0.44–1.00)

## 2020-04-15 MED ORDER — IOHEXOL 300 MG/ML  SOLN
100.0000 mL | Freq: Once | INTRAMUSCULAR | Status: AC | PRN
  Administered 2020-04-15: 100 mL via INTRAVENOUS

## 2020-04-16 ENCOUNTER — Telehealth: Payer: Self-pay | Admitting: Family Medicine

## 2020-04-16 ENCOUNTER — Ambulatory Visit (HOSPITAL_COMMUNITY): Admission: RE | Admit: 2020-04-16 | Payer: Medicare Other | Source: Ambulatory Visit

## 2020-04-16 ENCOUNTER — Other Ambulatory Visit: Payer: Self-pay | Admitting: Family Medicine

## 2020-04-16 ENCOUNTER — Ambulatory Visit (HOSPITAL_COMMUNITY)
Admission: RE | Admit: 2020-04-16 | Discharge: 2020-04-16 | Disposition: A | Discharge status: Home / Self Care | Payer: Medicare Other | Source: Ambulatory Visit | Attending: Family Medicine | Admitting: Family Medicine

## 2020-04-16 DIAGNOSIS — N2889 Other specified disorders of kidney and ureter: Secondary | ICD-10-CM | POA: Diagnosis not present

## 2020-04-16 DIAGNOSIS — Z9889 Other specified postprocedural states: Secondary | ICD-10-CM | POA: Diagnosis not present

## 2020-04-16 DIAGNOSIS — I700 Atherosclerosis of aorta: Secondary | ICD-10-CM | POA: Diagnosis not present

## 2020-04-16 DIAGNOSIS — Z9049 Acquired absence of other specified parts of digestive tract: Secondary | ICD-10-CM | POA: Diagnosis not present

## 2020-04-16 MED ORDER — GADOBUTROL 1 MMOL/ML IV SOLN
8.0000 mL | Freq: Once | INTRAVENOUS | Status: AC | PRN
  Administered 2020-04-16: 8 mL via INTRAVENOUS

## 2020-04-16 NOTE — Progress Notes (Signed)
I attempted to reach the patient regarding her recent CT, which showed some irregularities in the left kidney.  Renal cell carcinoma cannot be excluded without further imaging.  I left a message informing that further testing was needed and she would be called to schedule but did NOT leave details about the CT due to HIPAA.  MRI was ordered STAT and Courtney informed.

## 2020-04-16 NOTE — Progress Notes (Signed)
Dr Lajuana Ripple was able to reach patient and results were discussed.

## 2020-04-19 ENCOUNTER — Telehealth: Payer: Self-pay | Admitting: Family Medicine

## 2020-04-19 DIAGNOSIS — C642 Malignant neoplasm of left kidney, except renal pelvis: Secondary | ICD-10-CM

## 2020-04-19 NOTE — Telephone Encounter (Signed)
CT results consistent with renal cell carcinoma.  Attempted to reach patient regarding results at both her home phone number (no answer) and her cell (LVM, HIPAA compliant)  Will place stat order for oncology and urologic consult.  Buckley Bradly M. Lajuana Ripple, [facility]

## 2020-04-21 DIAGNOSIS — M25511 Pain in right shoulder: Secondary | ICD-10-CM | POA: Diagnosis not present

## 2020-04-27 ENCOUNTER — Ambulatory Visit (HOSPITAL_COMMUNITY): Payer: Medicare Other | Admitting: Hematology

## 2020-05-03 ENCOUNTER — Ambulatory Visit (HOSPITAL_COMMUNITY): Payer: Medicare Other

## 2020-05-13 DIAGNOSIS — M25561 Pain in right knee: Secondary | ICD-10-CM | POA: Diagnosis not present

## 2020-05-14 DIAGNOSIS — M25561 Pain in right knee: Secondary | ICD-10-CM | POA: Diagnosis not present

## 2020-05-16 DIAGNOSIS — U071 COVID-19: Secondary | ICD-10-CM

## 2020-05-16 HISTORY — DX: COVID-19: U07.1

## 2020-05-19 ENCOUNTER — Encounter: Payer: Self-pay | Admitting: Urology

## 2020-05-19 ENCOUNTER — Other Ambulatory Visit: Payer: Self-pay

## 2020-05-19 ENCOUNTER — Ambulatory Visit (INDEPENDENT_AMBULATORY_CARE_PROVIDER_SITE_OTHER): Payer: Medicare Other | Admitting: Urology

## 2020-05-19 VITALS — BP 113/74 | HR 92 | Temp 98.3°F | Ht 64.0 in | Wt 191.0 lb

## 2020-05-19 DIAGNOSIS — C642 Malignant neoplasm of left kidney, except renal pelvis: Secondary | ICD-10-CM

## 2020-05-19 DIAGNOSIS — C641 Malignant neoplasm of right kidney, except renal pelvis: Secondary | ICD-10-CM | POA: Diagnosis not present

## 2020-05-19 LAB — URINALYSIS, ROUTINE W REFLEX MICROSCOPIC
Bilirubin, UA: NEGATIVE
Glucose, UA: NEGATIVE
Nitrite, UA: NEGATIVE
Specific Gravity, UA: >1.03 — ABNORMAL HIGH (ref 1.005–1.030)
Urobilinogen, Ur: 0.2 mg/dL (ref 0.2–1.0)
pH, UA: 6.5 (ref 5.0–7.5)

## 2020-05-19 LAB — MICROSCOPIC EXAMINATION

## 2020-05-19 NOTE — Progress Notes (Addendum)
05/19/2020 11:30 AM   Leah [name] 03/22/1950 [id number]  Referring provider: Janora Norlander, DO [address] [place],  [place] [address]  Left renal mass  HPI: [name] is a 7TK here for evaluation of a left renal mass. She underwent CT March 31st which showed questionable left renal mass. MRI April 1st which shows a 1.3cm left mid pole mesophytic mass. No LUTS. No hematuria. She has a hx of hernia repair with abdominal mesh 2010. No other complaints today   PMH: Past Medical History:  Diagnosis Date  . Allergy   . Allergy to alpha-gal   . Anxiety   . Arthritis   . Asthma   . CAD (coronary artery disease)   . Colon polyp   . Depression   . Food allergy   . GERD (gastroesophageal reflux disease)   . Hyperlipidemia   . Hypertension   . Urticaria     Surgical History: Past Surgical History:  Procedure Laterality Date  . ABDOMINAL HYSTERECTOMY    . Carpel Tunnel    . CHOLECYSTECTOMY    . COLONOSCOPY  02/08/2005   ZSW:FUXNATFT hemorrhoids otherwise normal rectum/ A few scattered, shallow, left-sided sigmoid diverticula/The remainder of the colonic mucosa appeared normal  . COLONOSCOPY WITH ESOPHAGOGASTRODUODENOSCOPY (EGD) N/A 11/29/2012   Procedure: COLONOSCOPY WITH ESOPHAGOGASTRODUODENOSCOPY (EGD);  Surgeon: Daneil Dolin, MD;  Location: [facility];  Service: Endoscopy;  Laterality: N/A;  10:45  . CYST REMOVAL TRUNK    . HERNIA REPAIR     ventral X 2 with mesh, last one 2011 ([place]). complicated with 2 week hospital stay  . HERNIA REPAIR     with mesh  . NISSEN FUNDOPLICATION  7322  . POLYPECTOMY    . ROTATOR CUFF REPAIR Left 04/04/2019  . TUBAL LIGATION      Home Medications:  Allergies as of 05/19/2020      Reactions   Codeine Nausea And Vomiting   Levaquin [levofloxacin] Other (See Comments)   Achilles tendon pain   Meat [alpha-gal]    Penicillins Other (See Comments)   Does not know   Praluent [alirocumab]    Rash and myalgias    Strawberry (diagnostic)       Medication List       Accurate as of May 19, 2020 11:30 AM. If you have any questions, ask your nurse or doctor.        albuterol (2.5 MG/3ML) 0.083% nebulizer solution Commonly known as: PROVENTIL Take 3 mLs (2.5 mg total) by nebulization every 6 (six) hours as needed for wheezing or shortness of breath.   ProAir HFA 108 (90 Base) MCG/ACT inhaler Generic drug: albuterol 2 PUFFS EVERY 6 HOURS AS NEEDED FOR WHEEZING OR SHORTNESS OF BREATH   Albuterol Sulfate 2.5 MG/0.5ML Nebu SMARTSIG:1 Vial(s) Via Nebulizer Every 6 Hours PRN   aspirin 81 MG tablet Take 81 mg by mouth daily.   calcium carbonate 500 MG chewable tablet Commonly known as: Tums Chew 1 tablet (200 mg of elemental calcium total) by mouth as needed for indigestion or heartburn.   cyclobenzaprine 5 MG tablet Commonly known as: FLEXERIL Take 1 tablet (5 mg total) by mouth 3 (three) times daily as needed for muscle spasms.   EMERGEN-C IMMUNE PO Take by mouth.   EPINEPHrine 0.3 mg/0.3 mL Soaj injection Commonly known as: EPI-PEN Inject into the muscle once.   famotidine 40 MG tablet Commonly known as: PEPCID Take 1 tablet (40 mg total) by mouth daily.   fish  oil-omega-3 fatty acids 1000 MG capsule Take 2 g by mouth daily.   fluticasone 50 MCG/ACT nasal spray Commonly known as: FLONASE Place 2 sprays into both nostrils daily.   ibuprofen 800 MG tablet Commonly known as: ADVIL Take 1 tablet (800 mg total) by mouth at bedtime as needed for moderate pain or cramping.   loratadine 10 MG tablet Commonly known as: CLARITIN Take 10 mg by mouth 2 (two) times daily.   losartan-hydrochlorothiazide 100-12.5 MG tablet Commonly known as: HYZAAR Take 1 tablet by mouth daily.   pantoprazole 40 MG tablet Commonly known as: PROTONIX Take 1 tablet (40 mg total) by mouth daily. For stomach   rosuvastatin 20 MG tablet Commonly known as: Crestor Take 1 tablet (20 mg total) by mouth  daily.   Vitamin D 50 MCG (2000 UT) tablet Take 2,000 Units by mouth daily.       Allergies:  Allergies  Allergen Reactions  . Codeine Nausea And Vomiting  . Levaquin [Levofloxacin] Other (See Comments)    Achilles tendon pain  . Meat [Alpha-Gal]   . Penicillins Other (See Comments)    Does not know  . Praluent [Alirocumab]     Rash and myalgias  . Strawberry (Diagnostic)     Family History: Family History  Problem Relation Age of Onset  . Heart disease Mother   . Alzheimer's disease Mother   . Diabetes Mother   . Other Father        deceased age 45 from some sort of GI problems but no malignancy  . Diabetes Sister   . Leukemia Brother        CLL  . Asthma Sister   . Colon polyps Sister   . Diabetes Brother   . Cancer Cousin        pancreatic  . Myasthenia gravis Daughter   . Diabetes Daughter   . Cancer Cousin        pancreatic  . Colon cancer Neg Hx   . Stomach cancer Neg Hx   . Esophageal cancer Neg Hx   . Rectal cancer Neg Hx     Social History:  reports that she quit smoking about 24 years ago. Her smoking use included cigarettes. She has a 14.00 pack-year smoking history. She has never used smokeless tobacco. She reports current alcohol use of about 2.0 standard drinks of alcohol per week. She reports that she does not use drugs.  ROS: All other review of systems were reviewed and are negative except what is noted above in HPI  Physical Exam: BP 113/74   Pulse 92   Temp 98.3 F (36.8 C)   Ht 5\' 4"  (1.626 m)   Wt 191 lb (86.6 kg)   BMI 32.79 kg/m   Constitutional:  Alert and oriented, No acute distress. HEENT: [place] AT, moist mucus membranes.  Trachea midline, no masses. Cardiovascular: No clubbing, cyanosis, or edema. Respiratory: Normal respiratory effort, no increased work of breathing. GI: Abdomen is soft, nontender, nondistended, no abdominal masses GU: No CVA tenderness.  Lymph: No cervical or inguinal lymphadenopathy. Skin: No rashes,  bruises or suspicious lesions. Neurologic: Grossly intact, no focal deficits, moving all 4 extremities. Psychiatric: Normal mood and affect.  Laboratory Data: Lab Results  Component Value Date   WBC 3.2 (L) 04/05/2020   HGB 12.7 04/05/2020   HCT 38.8 04/05/2020   MCV 83 04/05/2020   PLT 294 04/05/2020    Lab Results  Component Value Date   CREATININE 1.00 04/15/2020  No results found for: PSA  No results found for: TESTOSTERONE  Lab Results  Component Value Date   HGBA1C 5.9 03/31/2019    Urinalysis    Component Value Date/Time   COLORURINE YELLOW 06/10/2007 1500   APPEARANCEUR Clear 03/10/2020 1204   LABSPEC 1.006 06/10/2007 1500   PHURINE 7.0 06/10/2007 1500   GLUCOSEU Negative 03/10/2020 1204   HGBUR NEGATIVE 06/10/2007 1500   BILIRUBINUR Negative 03/10/2020 [place] 06/10/2007 1500   PROTEINUR Negative 03/10/2020 1204   PROTEINUR NEGATIVE 06/10/2007 1500   UROBILINOGEN negative 11/26/2012 1526   UROBILINOGEN 0.2 06/10/2007 1500   NITRITE Negative 03/10/2020 1204   NITRITE NEGATIVE 06/10/2007 1500   LEUKOCYTESUR 2+ (A) 03/10/2020 1204    Lab Results  Component Value Date   LABMICR See below: 03/10/2020   WBCUA 0-5 03/10/2020   RBCUA None seen 12/15/2016   LABEPIT 0-10 03/10/2020   MUCUS neg 11/26/2012   BACTERIA Few 03/10/2020    Pertinent Imaging: MRI 04/16/2020: Images reviewed and discussed with the patient  Results for orders placed in visit on 10/28/19  DG Abd 1 View  Narrative CLINICAL DATA:  Abdominal pain.  EXAM: ABDOMEN - 1 VIEW  COMPARISON:  CT 07/23/2014  FINDINGS: The bowel gas pattern is normal. Moderate colonic stool burden. Approximately 4 mm rounded density inferior to the left L2 transverse process may represent a calculus in the proximal left ureter. Calcified phleboliths in the anatomic pelvis. Cholecystectomy clips. Postsurgical changes related to hernia repair.  IMPRESSION: 1. Approximately 4 mm  rounded density inferior to the left L2 transverse process is may represent a calculus in the proximal left ureter. 2. Nonobstructive bowel gas pattern.  Moderate colonic stool burden.   Electronically Signed By: Margaretha Sheffield MD On: 10/29/2019 15:49  No results found for this or any previous visit.  No results found for this or any previous visit.  No results found for this or any previous visit.  No results found for this or any previous visit.  No results found for this or any previous visit.  No results found for this or any previous visit.  No results found for this or any previous visit.   Assessment & Plan:    1. Renal cell carcinoma of left kidney (HCC) We discussed the natural hx of renal masses and the 80/20 malignant/benign likelihood. We disucssed the treatment options including active surveillance. Renal ablation, partial and radical nephrectomy. AFter discussing the options the patient wishesot proceed with left robotic partial nephrectomy     No follow-ups on file.  Nicolette Bang, MD  [facility] [place]

## 2020-05-19 NOTE — Patient Instructions (Signed)

## 2020-05-19 NOTE — Progress Notes (Signed)
Urological Symptom Review  Patient is experiencing the following symptoms: Frequent urination Get up at night to urinate Stream starts and stops   Review of Systems  Gastrointestinal (upper)  : Negative for upper GI symptoms  Gastrointestinal (lower) : Negative for lower GI symptoms  Constitutional : Night Sweats  Skin: Negative for skin symptoms  Eyes: Negative for eye symptoms  Ear/Nose/Throat : Negative for Ear/Nose/Throat symptoms  Hematologic/Lymphatic: Negative for Hematologic/Lymphatic symptoms  Cardiovascular : Negative for cardiovascular symptoms  Respiratory : Negative for respiratory symptoms  Endocrine: Negative for endocrine symptoms  Musculoskeletal: Back pain  Neurological: Negative for neurological symptoms  Psychologic: Negative for psychiatric symptoms

## 2020-05-20 DIAGNOSIS — S83249A Other tear of medial meniscus, current injury, unspecified knee, initial encounter: Secondary | ICD-10-CM | POA: Insufficient documentation

## 2020-05-20 DIAGNOSIS — S83241A Other tear of medial meniscus, current injury, right knee, initial encounter: Secondary | ICD-10-CM | POA: Diagnosis not present

## 2020-05-26 ENCOUNTER — Encounter: Payer: Self-pay | Admitting: Family Medicine

## 2020-05-26 ENCOUNTER — Ambulatory Visit (INDEPENDENT_AMBULATORY_CARE_PROVIDER_SITE_OTHER): Payer: Medicare Other | Admitting: Family Medicine

## 2020-05-26 VITALS — BP 126/74 | HR 100 | Temp 98.0°F

## 2020-05-26 DIAGNOSIS — J4521 Mild intermittent asthma with (acute) exacerbation: Secondary | ICD-10-CM

## 2020-05-26 MED ORDER — ALBUTEROL SULFATE HFA 108 (90 BASE) MCG/ACT IN AERS: 2.0000 | INHALATION_SPRAY | RESPIRATORY_TRACT | 3 refills | Status: DC | PRN

## 2020-05-26 MED ORDER — ALBUTEROL SULFATE 2.5 MG/0.5ML IN NEBU: 2.5000 mg | INHALATION_SOLUTION | Freq: Four times a day (QID) | RESPIRATORY_TRACT | 3 refills | Status: AC | PRN

## 2020-05-26 MED ORDER — BENZONATATE 100 MG PO CAPS: 100.0000 mg | ORAL_CAPSULE | Freq: Two times a day (BID) | ORAL | 0 refills | Status: DC | PRN

## 2020-05-26 MED ORDER — PREDNISONE 20 MG PO TABS: ORAL_TABLET | ORAL | 0 refills | Status: DC

## 2020-05-26 NOTE — Progress Notes (Signed)
BP 126/74   Pulse 100   Temp 98 F (36.7 C)   SpO2 100%    Subjective:   Patient ID: Leah [name], female    DOB: 1950/09/11, 70 y.o.   MRN: [id number]  HPI: Leah [name] is a 70 y.o. female presenting on 05/26/2020 for URI (Cough, fever, feels unwell. Asthma attack last pm. First in a long time)   HPI Patient is coming in today with complaints of cough and fever and congestion.  She states that she has been having this for about the past 3 days but feels like it is worsening.  She said she had a fever of 100.9 yesterday and then today has been in the 99 range.  She says last night she started having asthma and wheezing along with her cough and fever last the first time in a long time that she has had that.  She does feel little tight and a little bit shortness of breath with it.  She has been using her albuterol inhalers and they have been helping but not clearing this.  She was at a graduation over last weekend and so she may been exposed to all kinds of illnesses that she does not know of. She is vaccinated for both COVID and flu  Relevant past medical, surgical, family and social history reviewed and updated as indicated. Interim medical history since our last visit reviewed. Allergies and medications reviewed and updated.  Review of Systems  Constitutional: Positive for chills and fever.  HENT: Positive for congestion, postnasal drip, rhinorrhea, sinus pressure, sneezing and sore throat. Negative for ear discharge and ear pain.   Eyes: Negative for pain, redness and visual disturbance.  Respiratory: Positive for cough, shortness of breath and wheezing. Negative for chest tightness.   Cardiovascular: Negative for chest pain and leg swelling.  Musculoskeletal: Positive for myalgias. Negative for back pain and gait problem.  Skin: Negative for rash.  Neurological: Negative for light-headedness and headaches.  Psychiatric/Behavioral: Negative for agitation and behavioral  problems.  All other systems reviewed and are negative.   Per HPI unless specifically indicated above   Allergies as of 05/26/2020      Reactions   Codeine Nausea And Vomiting   Levaquin [levofloxacin] Other (See Comments)   Achilles tendon pain   Meat [alpha-gal]    Penicillins Other (See Comments)   Does not know   Praluent [alirocumab]    Rash and myalgias   Strawberry (diagnostic)       Medication List       Accurate as of May 26, 2020  4:24 PM. If you have any questions, ask your nurse or doctor.        albuterol (2.5 MG/3ML) 0.083% nebulizer solution Commonly known as: PROVENTIL Take 3 mLs (2.5 mg total) by nebulization every 6 (six) hours as needed for wheezing or shortness of breath.   ProAir HFA 108 (90 Base) MCG/ACT inhaler Generic drug: albuterol 2 PUFFS EVERY 6 HOURS AS NEEDED FOR WHEEZING OR SHORTNESS OF BREATH   Albuterol Sulfate 2.5 MG/0.5ML Nebu SMARTSIG:1 Vial(s) Via Nebulizer Every 6 Hours PRN   aspirin 81 MG tablet Take 81 mg by mouth daily.   benzonatate 100 MG capsule Commonly known as: TESSALON Take 1 capsule (100 mg total) by mouth 2 (two) times daily as needed for cough. Started by: Worthy Rancher, MD   calcium carbonate 500 MG chewable tablet Commonly known as: Tums Chew 1 tablet (200 mg of elemental calcium total) by  mouth as needed for indigestion or heartburn.   cyclobenzaprine 5 MG tablet Commonly known as: FLEXERIL Take 1 tablet (5 mg total) by mouth 3 (three) times daily as needed for muscle spasms.   EMERGEN-C IMMUNE PO Take by mouth.   EPINEPHrine 0.3 mg/0.3 mL Soaj injection Commonly known as: EPI-PEN Inject into the muscle once.   famotidine 40 MG tablet Commonly known as: PEPCID Take 1 tablet (40 mg total) by mouth daily.   fish oil-omega-3 fatty acids 1000 MG capsule Take 2 g by mouth daily.   fluticasone 50 MCG/ACT nasal spray Commonly known as: FLONASE Place 2 sprays into both nostrils daily.   ibuprofen  800 MG tablet Commonly known as: ADVIL Take 1 tablet (800 mg total) by mouth at bedtime as needed for moderate pain or cramping.   loratadine 10 MG tablet Commonly known as: CLARITIN Take 10 mg by mouth 2 (two) times daily.   losartan-hydrochlorothiazide 100-12.5 MG tablet Commonly known as: HYZAAR Take 1 tablet by mouth daily.   pantoprazole 40 MG tablet Commonly known as: PROTONIX Take 1 tablet (40 mg total) by mouth daily. For stomach   predniSONE 20 MG tablet Commonly known as: DELTASONE 2 po at same time daily for 5 days Started by: Fransisca Kaufmann Rika Daughdrill, MD   rosuvastatin 20 MG tablet Commonly known as: Crestor Take 1 tablet (20 mg total) by mouth daily.   Vitamin D 50 MCG (2000 UT) tablet Take 2,000 Units by mouth daily.        Objective:   BP 126/74   Pulse 100   Temp 98 F (36.7 C)   SpO2 100%   Wt Readings from Last 3 Encounters:  05/19/20 191 lb (86.6 kg)  04/05/20 192 lb (87.1 kg)  03/10/20 195 lb 2 oz (88.5 kg)    Physical Exam Vitals reviewed.  Constitutional:      General: She is not in acute distress.    Appearance: She is well-developed. She is not diaphoretic.  HENT:     Right Ear: Tympanic membrane, ear canal and external ear normal.     Left Ear: Tympanic membrane, ear canal and external ear normal.     Nose: Mucosal edema and rhinorrhea present.     Right Sinus: No maxillary sinus tenderness or frontal sinus tenderness.     Left Sinus: No maxillary sinus tenderness or frontal sinus tenderness.     Mouth/Throat:     Pharynx: Uvula midline. Posterior oropharyngeal erythema present. No oropharyngeal exudate.     Tonsils: No tonsillar abscesses.  Eyes:     Conjunctiva/sclera: Conjunctivae normal.  Cardiovascular:     Rate and Rhythm: Normal rate and regular rhythm.     Heart sounds: Normal heart sounds. No murmur heard.   Pulmonary:     Effort: Pulmonary effort is normal. No respiratory distress.     Breath sounds: Rhonchi present. No  wheezing or rales.  Chest:     Chest wall: Tenderness (Chest wall tenderness both anterior and posteriorly) present.  Musculoskeletal:        General: No tenderness. Normal range of motion.  Skin:    General: Skin is warm and dry.     Findings: No rash.  Neurological:     Mental Status: She is alert and oriented to person, place, and time.     Coordination: Coordination normal.  Psychiatric:        Behavior: Behavior normal.       Assessment & Plan:   Problem List  Items Addressed This Visit   None   Visit Diagnoses    Mild intermittent asthma with exacerbation    -  Primary   Relevant Medications   predniSONE (DELTASONE) 20 MG tablet   benzonatate (TESSALON) 100 MG capsule   Albuterol Sulfate 2.5 MG/0.5ML NEBU   albuterol (PROAIR HFA) 108 (90 Base) MCG/ACT inhaler   Other Relevant Orders   Novel Coronavirus, NAA ([facility])   Veritor Flu A/B Waived       Follow up plan: Return if symptoms worsen or fail to improve.  Counseling provided for all of the vaccine components Orders Placed This Encounter  Procedures  . Novel Coronavirus, NAA ([facility])  . Veritor Flu A/B Ulyses Southward, MD [facility] 05/26/2020, 4:24 PM

## 2020-05-27 ENCOUNTER — Other Ambulatory Visit: Payer: Self-pay | Admitting: Family Medicine

## 2020-05-27 LAB — NOVEL CORONAVIRUS, NAA: SARS-CoV-2, NAA: DETECTED — AB

## 2020-05-27 LAB — SARS-COV-2, NAA 2 DAY TAT

## 2020-05-27 LAB — VERITOR FLU A/B WAIVED
Influenza A: NEGATIVE
Influenza B: NEGATIVE

## 2020-05-28 ENCOUNTER — Encounter: Payer: Self-pay | Admitting: Nurse Practitioner

## 2020-05-28 ENCOUNTER — Telehealth: Payer: Self-pay

## 2020-05-28 ENCOUNTER — Other Ambulatory Visit: Payer: Self-pay | Admitting: Nurse Practitioner

## 2020-05-28 DIAGNOSIS — I2510 Atherosclerotic heart disease of native coronary artery without angina pectoris: Secondary | ICD-10-CM | POA: Insufficient documentation

## 2020-05-28 DIAGNOSIS — I10 Essential (primary) hypertension: Secondary | ICD-10-CM

## 2020-05-28 DIAGNOSIS — U071 COVID-19: Secondary | ICD-10-CM

## 2020-05-28 MED ORDER — NIRMATRELVIR/RITONAVIR (PAXLOVID)TABLET: 3.0000 | ORAL_TABLET | Freq: Two times a day (BID) | ORAL | 0 refills | Status: AC

## 2020-05-28 NOTE — Telephone Encounter (Signed)
Patient is returning a call to [place] regarding covid symptoms.  Please call patient back at [phone]

## 2020-05-28 NOTE — Telephone Encounter (Addendum)
Called to discuss with patient about COVID-19 symptoms and the use of one of the available treatments for those with mild to moderate Covid symptoms and at a high risk of hospitalization.  Pt appears to qualify for outpatient treatment due to co-morbid conditions and/or a member of an at-risk group in accordance with the FDA Emergency Use Authorization.    Symptom onset: 05/25/20 Chills,fever,sore throat,sinus congestion,cough Vaccinated: Yes Booster? Yes Immunocompromised? No Qualifiers: Asthma,CAD,HTN NIH Criteria: Tier 1  Unable to reach pt - left message and call back number [phone].  Pt. Called back and would like to speak with APP. Marcello Moores

## 2020-05-28 NOTE — Progress Notes (Signed)
Outpatient Oral COVID Treatment Note  I connected with Leah [name] on 05/28/2020/1:43 PM by telephone and verified that I am speaking with the correct person using two identifiers.  I discussed the limitations, risks, security, and privacy concerns of performing an evaluation and management service by telephone and the availability of in person appointments. I also discussed with the patient that there may be a patient responsible charge related to this service. The patient expressed understanding and agreed to proceed.  Patient location: Home  Provider location: Office  Diagnosis: COVID-19 infection  Purpose of visit: Discussion of potential use of Molnupiravir or Paxlovid, a new treatment for mild to moderate COVID-19 viral infection in non-hospitalized patients.   Subjective: Patient is a 70 y.o. female who has been diagnosed with COVID 19 viral infection.  Their symptoms began on 05/24/2020 with congestion, fever, and chills.    Past Medical History:  Diagnosis Date  . Allergy   . Allergy to alpha-gal   . Anxiety   . Arthritis   . Asthma   . CAD (coronary artery disease)   . Colon polyp   . COVID-19 virus infection 05/2020  . Depression   . Food allergy   . GERD (gastroesophageal reflux disease)   . Hyperlipidemia   . Hypertension   . Urticaria     Allergies  Allergen Reactions  . Codeine Nausea And Vomiting  . Levaquin [Levofloxacin] Other (See Comments)    Achilles tendon pain  . Meat [Alpha-Gal]   . Penicillins Other (See Comments)    Does not know  . Praluent [Alirocumab]     Rash and myalgias  . Strawberry (Diagnostic)      Current Outpatient Medications:  .  albuterol (PROAIR HFA) 108 (90 Base) MCG/ACT inhaler, Inhale 2 puffs into the lungs every 4 (four) hours as needed for wheezing or shortness of breath., Disp: 8.5 g, Rfl: 3 .  albuterol (PROVENTIL) (2.5 MG/3ML) 0.083% nebulizer solution, Take 3 mLs (2.5 mg total) by nebulization every 6 (six) hours as  needed for wheezing or shortness of breath., Disp: 150 mL, Rfl: 6 .  Albuterol Sulfate 2.5 MG/0.5ML NEBU, Take 0.5 mLs (2.5 mg total) by nebulization every 6 (six) hours as needed., Disp: 20 mL, Rfl: 3 .  aspirin 81 MG tablet, Take 81 mg by mouth daily., Disp: , Rfl:  .  benzonatate (TESSALON) 100 MG capsule, Take 1 capsule (100 mg total) by mouth 2 (two) times daily as needed for cough., Disp: 20 capsule, Rfl: 0 .  calcium carbonate (TUMS) 500 MG chewable tablet, Chew 1 tablet (200 mg of elemental calcium total) by mouth as needed for indigestion or heartburn., Disp: , Rfl:  .  Cholecalciferol (VITAMIN D) 2000 UNITS tablet, Take 2,000 Units by mouth daily., Disp: , Rfl:  .  cyclobenzaprine (FLEXERIL) 5 MG tablet, Take 1 tablet (5 mg total) by mouth 3 (three) times daily as needed for muscle spasms., Disp: 30 tablet, Rfl: 1 .  EPINEPHrine 0.3 mg/0.3 mL IJ SOAJ injection, Inject into the muscle once., Disp: , Rfl:  .  famotidine (PEPCID) 40 MG tablet, Take 1 tablet (40 mg total) by mouth daily., Disp: 90 tablet, Rfl: 3 .  fish oil-omega-3 fatty acids 1000 MG capsule, Take 2 g by mouth daily. , Disp: , Rfl:  .  fluticasone (FLONASE) 50 MCG/ACT nasal spray, Place 2 sprays into both nostrils daily., Disp: 16 g, Rfl: 6 .  ibuprofen (ADVIL) 800 MG tablet, Take 1 tablet (800 mg total) by  mouth at bedtime as needed for moderate pain or cramping., Disp: 30 tablet, Rfl: 0 .  loratadine (CLARITIN) 10 MG tablet, Take 10 mg by mouth 2 (two) times daily., Disp: , Rfl:  .  losartan-hydrochlorothiazide (HYZAAR) 100-12.5 MG tablet, TAKE ONE TABLET BY MOUTH DAILY, Disp: 90 tablet, Rfl: 1 .  Multiple Vitamins-Minerals (EMERGEN-C IMMUNE PO), Take by mouth., Disp: , Rfl:  .  pantoprazole (PROTONIX) 40 MG tablet, Take 1 tablet (40 mg total) by mouth daily. For stomach, Disp: 30 tablet, Rfl: 11 .  predniSONE (DELTASONE) 20 MG tablet, 2 po at same time daily for 5 days, Disp: 10 tablet, Rfl: 0 .  rosuvastatin (CRESTOR) 20  MG tablet, Take 1 tablet (20 mg total) by mouth daily., Disp: 90 tablet, Rfl: 3  Objective: Patient sounds well.  They are in no apparent distress.  Breathing is non labored.  Mood and behavior are normal.  Laboratory Data:  Recent Results (from the past 2160 hour(s))  Urinalysis, Complete     Status: Abnormal   Collection Time: 03/10/20 12:04 PM  Result Value Ref Range   Specific Gravity, UA 1.010 1.005 - 1.030   pH, UA 7.0 5.0 - 7.5   Color, UA Yellow Yellow   Appearance Ur Clear Clear   Leukocytes,UA 2+ (A) Negative   Protein,UA Negative Negative/Trace   Glucose, UA Negative Negative   Ketones, UA Negative Negative   RBC, UA Negative Negative   Bilirubin, UA Negative Negative   Urobilinogen, Ur 0.2 0.2 - 1.0 mg/dL   Nitrite, UA Negative Negative   Microscopic Examination See below:   Microscopic Examination     Status: None   Collection Time: 03/10/20 12:04 PM   Urine  Result Value Ref Range   WBC, UA 0-5 0 - 5 /hpf   RBC None seen 0 - 2 /hpf   Epithelial Cells (non renal) 0-10 0 - 10 /hpf   Bacteria, UA Few None seen/Few  CBC with Differential/Platelet     Status: Abnormal   Collection Time: 03/10/20 12:37 PM  Result Value Ref Range   WBC 3.2 (L) 3.4 - 10.8 x10E3/uL   RBC 4.79 3.77 - 5.28 x10E6/uL   Hemoglobin 13.0 11.1 - 15.9 g/dL   Hematocrit 39.8 34.0 - 46.6 %   MCV 83 79 - 97 fL   MCH 27.1 26.6 - 33.0 pg   MCHC 32.7 31.5 - 35.7 g/dL   RDW 13.7 11.7 - 15.4 %   Platelets 277 150 - 450 x10E3/uL   Neutrophils 49 Not Estab. %   Lymphs 37 Not Estab. %   Monocytes 10 Not Estab. %   Eos 3 Not Estab. %   Basos 1 Not Estab. %   Neutrophils Absolute 1.6 1.4 - 7.0 x10E3/uL   Lymphocytes Absolute 1.2 0.7 - 3.1 x10E3/uL   Monocytes Absolute 0.3 0.1 - 0.9 x10E3/uL   EOS (ABSOLUTE) 0.1 0.0 - 0.4 x10E3/uL   Basophils Absolute 0.0 0.0 - 0.2 x10E3/uL   Immature Granulocytes 0 Not Estab. %   Immature Grans (Abs) 0.0 0.0 - 0.1 x10E3/uL  CMP14+EGFR     Status: Abnormal    Collection Time: 03/10/20 12:37 PM  Result Value Ref Range   Glucose 107 (H) 65 - 99 mg/dL   BUN 12 8 - 27 mg/dL   Creatinine, Ser 0.96 0.57 - 1.00 mg/dL    Comment:                **Effective March 15, 2020 [facility] will begin**  reporting the 2021 CKD-EPI creatinine equation that                  estimates kidney function without a race variable.    GFR calc non Af Amer 61 >59 mL/min/1.73   GFR calc Af Amer 70 >59 mL/min/1.73    Comment: **In accordance with recommendations from the NKF-ASN Task force,**   [facility] is in the process of updating its eGFR calculation to the   2021 CKD-EPI creatinine equation that estimates kidney function   without a race variable.    BUN/Creatinine Ratio 13 12 - 28   Sodium 143 134 - 144 mmol/L   Potassium 4.1 3.5 - 5.2 mmol/L   Chloride 104 96 - 106 mmol/L   CO2 25 20 - 29 mmol/L   Calcium 9.1 8.7 - 10.3 mg/dL   Total Protein 6.3 6.0 - 8.5 g/dL   Albumin 4.2 3.8 - 4.8 g/dL   Globulin, Total 2.1 1.5 - 4.5 g/dL   Albumin/Globulin Ratio 2.0 1.2 - 2.2   Bilirubin Total 0.2 0.0 - 1.2 mg/dL   Alkaline Phosphatase 107 44 - 121 IU/L   AST 24 0 - 40 IU/L   ALT 30 0 - 32 IU/L  Amylase     Status: Abnormal   Collection Time: 03/10/20 12:37 PM  Result Value Ref Range   Amylase 173 (H) 31 - 110 U/L  Lipase     Status: None   Collection Time: 03/10/20 12:37 PM  Result Value Ref Range   Lipase 28 14 - 72 U/L  Urine Culture     Status: None   Collection Time: 03/10/20 12:57 PM   Specimen: Urine   UC  Result Value Ref Range   Urine Culture, Routine Final report    Organism ID, Bacteria Comment     Comment: Mixed urogenital flora Less than 10,000 colonies/mL   Lipid Panel     Status: None   Collection Time: 04/05/20 10:11 AM  Result Value Ref Range   Cholesterol, Total 162 100 - 199 mg/dL   Triglycerides 78 0 - 149 mg/dL   HDL 54 >39 mg/dL   VLDL Cholesterol Cal 15 5 - 40 mg/dL   LDL Chol Calc (NIH) 93 0 - 99 mg/dL   Chol/HDL  Ratio 3.0 0.0 - 4.4 ratio    Comment:                                   T. Chol/HDL Ratio                                             Men  Women                               1/2 Avg.Risk  3.4    3.3                                   Avg.Risk  5.0    4.4                                2X Avg.Risk  9.6  7.1                                3X Avg.Risk 23.4   11.0   Lipase     Status: None   Collection Time: 04/05/20 10:11 AM  Result Value Ref Range   Lipase 23 14 - 72 U/L  Amylase     Status: Abnormal   Collection Time: 04/05/20 10:11 AM  Result Value Ref Range   Amylase 126 (H) 31 - 110 U/L  Pathologist smear review     Status: Abnormal   Collection Time: 04/05/20 10:20 AM  Result Value Ref Range   Path Rev WBC Comment (A)     Comment: Leukopenia due to low normal absolute counts for more than one subset. WBCs are mature.    Path Rev RBC Appear normal.    Path Rev PLTs Appear normal.    PATH INTERP BLD-IMP Comment     Comment: No pathologic diagnosis.   PATHOLOGIST NAME Comment     Comment: Reviewed by:  Davonna Belling, MD, Pathologist   WBC 3.2 (L) 3.4 - 10.8 x10E3/uL   RBC 4.65 3.77 - 5.28 x10E6/uL   Hemoglobin 12.7 11.1 - 15.9 g/dL   Hematocrit 38.8 34.0 - 46.6 %   MCV 83 79 - 97 fL   MCH 27.3 26.6 - 33.0 pg   MCHC 32.7 31.5 - 35.7 g/dL   RDW 13.2 11.7 - 15.4 %   Platelets 294 150 - 450 x10E3/uL   Neutrophils 51 Not Estab. %   Lymphs 37 Not Estab. %   Monocytes 9 Not Estab. %   Eos 2 Not Estab. %   Basos 1 Not Estab. %   Neutrophils Absolute 1.6 1.4 - 7.0 x10E3/uL   Lymphocytes Absolute 1.2 0.7 - 3.1 x10E3/uL   Monocytes Absolute 0.3 0.1 - 0.9 x10E3/uL   EOS (ABSOLUTE) 0.1 0.0 - 0.4 x10E3/uL   Basophils Absolute 0.0 0.0 - 0.2 x10E3/uL   Immature Granulocytes 0 Not Estab. %   Immature Grans (Abs) 0.0 0.0 - 0.1 x10E3/uL  I-STAT creatinine     Status: None   Collection Time: 04/15/20  3:45 PM  Result Value Ref Range   Creatinine, Ser 1.00 0.44 - 1.00 mg/dL   Urinalysis, Routine w reflex microscopic     Status: Abnormal   Collection Time: 05/19/20 11:07 AM  Result Value Ref Range   Specific Gravity, UA >1.030 (H) 1.005 - 1.030   pH, UA 6.5 5.0 - 7.5   Color, UA Yellow Yellow   Appearance Ur Clear Clear   Leukocytes,UA 1+ (A) Negative   Protein,UA Trace (A) Negative/Trace   Glucose, UA Negative Negative   Ketones, UA Trace (A) Negative   RBC, UA Trace (A) Negative   Bilirubin, UA Negative Negative   Urobilinogen, Ur 0.2 0.2 - 1.0 mg/dL   Nitrite, UA Negative Negative   Microscopic Examination See below:   Microscopic Examination     Status: Abnormal   Collection Time: 05/19/20 11:07 AM   Urine  Result Value Ref Range   WBC, UA 6-10 (A) 0 - 5 /hpf   RBC 0-2 0 - 2 /hpf   Epithelial Cells (non renal) 0-10 0 - 10 /hpf   Renal Epithel, UA 0-10 (A) None seen /hpf   Mucus, UA Present Not Estab.   Bacteria, UA Moderate (A) None seen/Few  Novel Coronavirus, NAA ([facility])     Status: Abnormal  Collection Time: 05/26/20 12:00 AM   Specimen: Nasopharyngeal(NP) swabs in vial transport medium   Nasopharynge  Result Value Ref Range   SARS-CoV-2, NAA Detected (A) Not Detected    Comment: Patients who have a positive COVID-19 test result may now have treatment options. Treatment options are available for patients with mild to moderate symptoms and for hospitalized patients. Visit our website at [email or web address] for resources and information. This nucleic acid amplification test was developed and its performance characteristics determined by [employer]. Nucleic acid amplification tests include RT-PCR and TMA. This test has not been FDA cleared or approved. This test has been authorized by FDA under an Emergency Use Authorization (EUA). This test is only authorized for the duration of time the declaration that circumstances exist justifying the authorization of the emergency use of in vitro diagnostic tests for  detection of SARS-CoV-2 virus and/or diagnosis of COVID-19 infection under section 564(b)(1) of the Act, 21 U.S.C. 469GEX-5(M) (1), unless the authorization is terminated or revoked sooner. When diagnostic testing is negativ e, the possibility of a false negative result should be considered in the context of a patient's recent exposures and the presence of clinical signs and symptoms consistent with COVID-19. An individual without symptoms of COVID-19 and who is not shedding SARS-CoV-2 virus would expect to have a negative (not detected) result in this assay.   SARS-COV-2, NAA 2 DAY TAT     Status: None   Collection Time: 05/26/20 12:00 AM   Nasopharynge  Result Value Ref Range   SARS-CoV-2, NAA 2 DAY TAT Performed   Veritor Flu A/B Waived     Status: None   Collection Time: 05/26/20  4:22 PM  Result Value Ref Range   Influenza A Negative Negative   Influenza B Negative Negative    Comment: If the test is negative for the presence of influenza A or influenza B antigen, infection due to influenza cannot be ruled-out because the antigen present in the sample may be below the detection limit of the test. It is recommended that these results be confirmed by viral culture or an FDA-cleared influenza A and B molecular assay.      Assessment: 70 y.o. female with mild/moderate COVID 19 viral infection diagnosed on 05/26/2020 at high risk for progression to severe COVID 19.  Plan:  This patient is a 70 y.o. female that meets the following criteria for Emergency Use Authorization of: Paxlovid 1. Age >12 yr AND > 40 kg 2. SARS-COV-2 positive test 3. Symptom onset < 5 days 4. Mild-to-moderate COVID disease with high risk for severe progression to hospitalization or death  I have spoken and communicated the following to the patient or parent/caregiver regarding: 1. Paxlovid is an unapproved drug that is authorized for use under an Emergency Use Authorization.  2. There are no adequate,  approved, available products for the treatment of COVID-19 in adults who have mild-to-moderate COVID-19 and are at high risk for progressing to severe COVID-19, including hospitalization or death. 3. Other therapeutics are currently authorized. For additional information on all products authorized for treatment or prevention of COVID-19, please see [email or web address].  4. There are benefits and risks of taking this treatment as outlined in the "Fact Sheet for Patients and Caregivers."  5. "Fact Sheet for Patients and Caregivers" was reviewed with patient. A hard copy will be provided to patient from pharmacy prior to the patient receiving treatment. 6. Patients should continue to self-isolate and use infection control measures (e.g., wear mask, isolate, social distance,  avoid sharing personal items, clean and disinfect "high touch" surfaces, and frequent handwashing) according to CDC guidelines.  7. The patient or parent/caregiver has the option to accept or refuse treatment. 8. Patient medication history was reviewed for potential drug interactions:Interaction with home meds: Advised to hold Flonase (not currently using, Prednisone (just rx 5/11), and rosuvastatin (chronic med) throughout the course of Paxlovid therapy. 9. Patient's GFR was calculated to be 61.36, and they were therefore prescribed Normal dose (GFR>60) - nirmatrelvir $RemoveBefore'150mg'[id number]$  tab (2 tablet) by mouth twice daily AND ritonavir $RemoveBefor'100mg'[id number]$  tab (1 tablet) by mouth twice daily   After reviewing above information with the patient, the patient agrees to receive Paxlovid.  Follow up instructions:    . Take prescription BID x 5 days as directed . Reach out to pharmacist for counseling on medication if desired . For concerns regarding further COVID symptoms please follow up with your PCP or urgent care . For urgent or life-threatening issues, seek  care at your local emergency department  The patient was provided an opportunity to ask questions, and all were answered. The patient agreed with the plan and demonstrated an understanding of the instructions.   Script sent to [facility] and opted to pick up RX.  The patient was advised to call their PCP or seek an in-person evaluation if the symptoms worsen or if the condition fails to improve as anticipated.   I provided 30 minutes of non face-to-face telephone visit time during this encounter, and > 50% was spent counseling as documented under my assessment & plan.  Murray Hodgkins, NP 05/28/2020 /1:43 PM

## 2020-06-01 ENCOUNTER — Telehealth: Payer: Self-pay | Admitting: Urology

## 2020-06-01 NOTE — Telephone Encounter (Signed)
Pt called wanting to know about a surgery date for June. Please call her when you can. Thanks

## 2020-06-02 ENCOUNTER — Telehealth: Payer: Self-pay

## 2020-06-02 DIAGNOSIS — Z0000 Encounter for general adult medical examination without abnormal findings: Secondary | ICD-10-CM

## 2020-06-02 MED ORDER — ALBUTEROL SULFATE (2.5 MG/3ML) 0.083% IN NEBU
2.5000 mg | INHALATION_SOLUTION | Freq: Four times a day (QID) | RESPIRATORY_TRACT | 6 refills | Status: DC | PRN
Start: 2020-06-02 — End: 2021-07-27

## 2020-06-02 NOTE — Telephone Encounter (Signed)
Per last OV note patient wishes to proceed with surgery. I don't have a posting sheet for this am.

## 2020-06-02 NOTE — Telephone Encounter (Signed)
Pt calling about this again.

## 2020-06-02 NOTE — Telephone Encounter (Signed)
I sent albuterol to [facility]

## 2020-06-03 ENCOUNTER — Other Ambulatory Visit: Payer: Self-pay

## 2020-06-03 DIAGNOSIS — C642 Malignant neoplasm of left kidney, except renal pelvis: Secondary | ICD-10-CM

## 2020-06-03 MED ORDER — MAGNESIUM CITRATE PO SOLN: 1.0000 | Freq: Once | ORAL | 0 refills | Status: AC

## 2020-06-03 NOTE — Telephone Encounter (Signed)
Received posting sheet from MD on 06/02/2020. Patient called and made aware of date of July 28th- pt voiced understanding.

## 2020-06-07 ENCOUNTER — Other Ambulatory Visit: Payer: Self-pay

## 2020-06-07 ENCOUNTER — Encounter: Payer: Self-pay | Admitting: Nurse Practitioner

## 2020-06-07 ENCOUNTER — Ambulatory Visit (INDEPENDENT_AMBULATORY_CARE_PROVIDER_SITE_OTHER): Payer: Medicare Other

## 2020-06-07 ENCOUNTER — Ambulatory Visit (INDEPENDENT_AMBULATORY_CARE_PROVIDER_SITE_OTHER): Payer: Medicare Other | Admitting: Nurse Practitioner

## 2020-06-07 VITALS — BP 122/77 | HR 84 | Temp 96.9°F | Ht 64.0 in | Wt 192.0 lb

## 2020-06-07 DIAGNOSIS — Z0389 Encounter for observation for other suspected diseases and conditions ruled out: Secondary | ICD-10-CM | POA: Diagnosis not present

## 2020-06-07 DIAGNOSIS — U071 COVID-19: Secondary | ICD-10-CM | POA: Diagnosis not present

## 2020-06-07 DIAGNOSIS — R059 Cough, unspecified: Secondary | ICD-10-CM | POA: Diagnosis not present

## 2020-06-07 NOTE — Assessment & Plan Note (Addendum)
Patient was positive for COVID-19 05/26/2020.  Patient continues to have worsening cough symptoms and presents to clinic today for retesting of COVID-19.  COVID-19 swab completed.  Education provided to patient that patient's test results may still be positive for at least a few months or weeks after testing positive.  Education provided to patient with printed handouts given on signs and symptoms of COVID and preventative measures.  Patient verbalized understanding. Patient also is requesting a chest x-ray to rule out pneumonia as she continues to report chest tightness congestion and cough.  On assessment patient's chest is clear with no wheezing or chest tightness.  Chest x-ray completed results pending.

## 2020-06-07 NOTE — Progress Notes (Addendum)
Acute Office Visit  Subjective:    Patient ID: Leah [name], female    DOB: 07-10-1950, 70 y.o.   MRN: [id number]  Chief Complaint  Patient presents with  . Cough    Cough This is a recurrent problem. The current episode started in the past 7 days. The problem has been gradually worsening. The problem occurs constantly. The cough is productive of sputum. Pertinent negatives include no chest pain, chills, ear congestion, ear pain, fever, heartburn or sore throat. Nothing (Positive for COVID) aggravates the symptoms. She has tried oral steroids for the symptoms. The treatment provided mild relief. Her past medical history is significant for asthma.      Past Medical History:  Diagnosis Date  . Allergy   . Allergy to alpha-gal   . Anxiety   . Arthritis   . Asthma   . CAD (coronary artery disease)   . Colon polyp   . COVID-19 virus infection 05/2020  . Depression   . Food allergy   . GERD (gastroesophageal reflux disease)   . Hyperlipidemia   . Hypertension   . Urticaria     Past Surgical History:  Procedure Laterality Date  . ABDOMINAL HYSTERECTOMY    . Carpel Tunnel    . CHOLECYSTECTOMY    . COLONOSCOPY  02/08/2005   [id number] hemorrhoids otherwise normal rectum/ A few scattered, shallow, left-sided sigmoid diverticula/The remainder of the colonic mucosa appeared normal  . COLONOSCOPY WITH ESOPHAGOGASTRODUODENOSCOPY (EGD) N/A 11/29/2012   Procedure: COLONOSCOPY WITH ESOPHAGOGASTRODUODENOSCOPY (EGD);  Surgeon: Daneil Dolin, MD;  Location: [facility];  Service: Endoscopy;  Laterality: N/A;  10:45  . CYST REMOVAL TRUNK    . HERNIA REPAIR     ventral X 2 with mesh, last one 2011 ([place]). complicated with 2 week hospital stay  . HERNIA REPAIR     with mesh  . NISSEN FUNDOPLICATION  1761  . POLYPECTOMY    . ROTATOR CUFF REPAIR Left 04/04/2019  . TUBAL LIGATION      Family History  Problem Relation Age of Onset  . Heart disease Mother   .  Alzheimer's disease Mother   . Diabetes Mother   . Other Father        deceased age 40 from some sort of GI problems but no malignancy  . Diabetes Sister   . Leukemia Brother        CLL  . Asthma Sister   . Colon polyps Sister   . Diabetes Brother   . Cancer Cousin        pancreatic  . Myasthenia gravis Daughter   . Diabetes Daughter   . Cancer Cousin        pancreatic  . Colon cancer Neg Hx   . Stomach cancer Neg Hx   . Esophageal cancer Neg Hx   . Rectal cancer Neg Hx     Social History   Socioeconomic History  . Marital status: Divorced    Spouse name: Not on file  . Number of children: 3  . Years of education: 12+  . Highest education level: Some college, no degree  Occupational History  . Occupation: private duty Manufacturing systems engineer  Tobacco Use  . Smoking status: Former Smoker    Packs/day: 0.50    Years: 28.00    Pack years: 14.00    Types: Cigarettes    Quit date: 01/17/1996    Years since quitting: 24.4  . Smokeless tobacco: Never Used  Vaping Use  . Vaping  Use: Never used  Substance and Sexual Activity  . Alcohol use: Yes    Alcohol/week: 2.0 standard drinks    Types: 2 Glasses of wine per week    Comment: occasional use of wine   . Drug use: No  . Sexual activity: Not Currently    Partners: Male    Birth control/protection: Surgical  Other Topics Concern  . Not on file  Social History Narrative  . Not on file   Social Determinants of Health   Financial Resource Strain: Low Risk   . Difficulty of Paying Living Expenses: Not hard at all  Food Insecurity: No Food Insecurity  . Worried About Charity fundraiser in the Last Year: Never true  . Ran Out of Food in the Last Year: Never true  Transportation Needs: No Transportation Needs  . Lack of Transportation (Medical): No  . Lack of Transportation (Non-Medical): No  Physical Activity: Not on file  Stress: Not on file  Social Connections: Moderately Integrated  . Frequency of Communication with  Friends and Family: More than three times a week  . Frequency of Social Gatherings with Friends and Family: More than three times a week  . Attends Religious Services: More than 4 times per year  . Active Member of Clubs or Organizations: Yes  . Attends Archivist Meetings: More than 4 times per year  . Marital Status: Divorced  Human resources officer Violence: Not on file    Outpatient Medications Prior to Visit  Medication Sig Dispense Refill  . albuterol (PROAIR HFA) 108 (90 Base) MCG/ACT inhaler Inhale 2 puffs into the lungs every 4 (four) hours as needed for wheezing or shortness of breath. 8.5 g 3  . albuterol (PROVENTIL) (2.5 MG/3ML) 0.083% nebulizer solution Take 3 mLs (2.5 mg total) by nebulization every 6 (six) hours as needed for wheezing or shortness of breath. 150 mL 6  . Albuterol Sulfate 2.5 MG/0.5ML NEBU Take 0.5 mLs (2.5 mg total) by nebulization every 6 (six) hours as needed. 20 mL 3  . aspirin 81 MG tablet Take 81 mg by mouth daily.    . benzonatate (TESSALON) 100 MG capsule Take 1 capsule (100 mg total) by mouth 2 (two) times daily as needed for cough. 20 capsule 0  . calcium carbonate (TUMS) 500 MG chewable tablet Chew 1 tablet (200 mg of elemental calcium total) by mouth as needed for indigestion or heartburn.    . Cholecalciferol (VITAMIN D) 2000 UNITS tablet Take 2,000 Units by mouth daily.    . cyclobenzaprine (FLEXERIL) 5 MG tablet Take 1 tablet (5 mg total) by mouth 3 (three) times daily as needed for muscle spasms. 30 tablet 1  . EPINEPHrine 0.3 mg/0.3 mL IJ SOAJ injection Inject into the muscle once.    . famotidine (PEPCID) 40 MG tablet Take 1 tablet (40 mg total) by mouth daily. 90 tablet 3  . fish oil-omega-3 fatty acids 1000 MG capsule Take 2 g by mouth daily.     . fluticasone (FLONASE) 50 MCG/ACT nasal spray Place 2 sprays into both nostrils daily. 16 g 6  . ibuprofen (ADVIL) 800 MG tablet Take 1 tablet (800 mg total) by mouth at bedtime as needed for  moderate pain or cramping. 30 tablet 0  . loratadine (CLARITIN) 10 MG tablet Take 10 mg by mouth 2 (two) times daily.    Marland Kitchen losartan-hydrochlorothiazide (HYZAAR) 100-12.5 MG tablet TAKE ONE TABLET BY MOUTH DAILY 90 tablet 1  . Multiple Vitamins-Minerals (EMERGEN-C IMMUNE PO)  Take by mouth.    . pantoprazole (PROTONIX) 40 MG tablet Take 1 tablet (40 mg total) by mouth daily. For stomach 30 tablet 11  . rosuvastatin (CRESTOR) 20 MG tablet Take 1 tablet (20 mg total) by mouth daily. 90 tablet 3  . predniSONE (DELTASONE) 20 MG tablet 2 po at same time daily for 5 days (Patient not taking: Reported on 06/07/2020) 10 tablet 0   No facility-administered medications prior to visit.    Allergies  Allergen Reactions  . Codeine Nausea And Vomiting  . Levaquin [Levofloxacin] Other (See Comments)    Achilles tendon pain  . Meat [Alpha-Gal]   . Penicillins Other (See Comments)    Does not know  . Praluent [Alirocumab]     Rash and myalgias  . Strawberry (Diagnostic)     Review of Systems  Constitutional: Negative for chills and fever.  HENT: Positive for congestion. Negative for ear pain and sore throat.   Respiratory: Positive for cough and chest tightness.   Cardiovascular: Negative for chest pain.  Gastrointestinal: Negative for heartburn.  All other systems reviewed and are negative.      Objective:    Physical Exam Vitals and nursing note reviewed.  Constitutional:      Appearance: Normal appearance.  HENT:     Head: Normocephalic.     Right Ear: External ear normal.     Left Ear: External ear normal.     Nose: Congestion present.  Eyes:     Conjunctiva/sclera: Conjunctivae normal.  Cardiovascular:     Rate and Rhythm: Normal rate and regular rhythm.     Pulses: Normal pulses.     Heart sounds: Normal heart sounds.  Pulmonary:     Effort: Pulmonary effort is normal.     Breath sounds: Normal breath sounds.  Abdominal:     General: Bowel sounds are normal.  Skin:     General: Skin is warm.     Findings: No rash.  Neurological:     Mental Status: She is alert and oriented to person, place, and time.     BP 122/77   Pulse 84   Temp (!) 96.9 F (36.1 C) (Temporal)   Ht 5\' 4"  (1.626 m)   Wt 192 lb (87.1 kg)   BMI 32.96 kg/m  Wt Readings from Last 3 Encounters:  06/07/20 192 lb (87.1 kg)  05/19/20 191 lb (86.6 kg)  04/05/20 192 lb (87.1 kg)    Health Maintenance Due  Topic Date Due  . COLON CANCER SCREENING ANNUAL FOBT  02/26/2019  . COVID-19 Vaccine (4 - Booster for Moderna series) 12/17/2019    There are no preventive care reminders to display for this patient.   Lab Results  Component Value Date   TSH 2.240 03/31/2019   Lab Results  Component Value Date   WBC 3.2 (L) 04/05/2020   HGB 12.7 04/05/2020   HCT 38.8 04/05/2020   MCV 83 04/05/2020   PLT 294 04/05/2020   Lab Results  Component Value Date   NA 143 03/10/2020   K 4.1 03/10/2020   CO2 25 03/10/2020   GLUCOSE 107 (H) 03/10/2020   BUN 12 03/10/2020   CREATININE 1.00 04/15/2020   BILITOT 0.2 03/10/2020   ALKPHOS 107 03/10/2020   AST 24 03/10/2020   ALT 30 03/10/2020   PROT 6.3 03/10/2020   ALBUMIN 4.2 03/10/2020   CALCIUM 9.1 03/10/2020   ANIONGAP 10 08/22/2018   Lab Results  Component Value Date   CHOL 162  04/05/2020   Lab Results  Component Value Date   HDL 54 04/05/2020   Lab Results  Component Value Date   LDLCALC 93 04/05/2020   Lab Results  Component Value Date   TRIG 78 04/05/2020   Lab Results  Component Value Date   CHOLHDL 3.0 04/05/2020   Lab Results  Component Value Date   HGBA1C 5.9 03/31/2019       Assessment & Plan:   Problem List Items Addressed This Visit      Other   [id number] - Primary    Patient was positive for COVID-19 05/26/2020.  Patient continues to have worsening cough symptoms and presents to clinic today for retesting of COVID-19.  COVID-19 swab completed.  Education provided to patient that patient's test  results may still be positive for at least a few months or weeks after testing positive.  Education provided to patient with printed handouts given on signs and symptoms of COVID and preventative measures.  Patient verbalized understanding. Patient also is requesting a chest x-ray to rule out pneumonia as she continues to report chest tightness congestion and cough.  On assessment patient's chest is clear with no wheezing or chest tightness.  Chest x-ray completed results pending.        Relevant Orders   Coronavirus ([id number]) with Influenza A and Influenza B   DG Chest 2 View (Completed)   Cough    Symptoms are resolved, ongoing in the last 11 days when patient tested positive for COVID-19.  Chest x-ray requested by patient completed results pending.  Education provided to patient with printed handouts given.  Patient understands that cough takes a while to resolve, and positive COVID-19 test may still be positive after 11 days and few weeks to months after testing positive. Advised patient to continue medication as prescribed and follow-up with worsening or unresolved symptoms.          No orders of the defined types were placed in this encounter.    Ivy Lynn, NP

## 2020-06-07 NOTE — Patient Instructions (Signed)

## 2020-06-07 NOTE — Assessment & Plan Note (Signed)
Symptoms are resolved, ongoing in the last 11 days when patient tested positive for COVID-19.  Chest x-ray requested by patient completed results pending.  Education provided to patient with printed handouts given.  Patient understands that cough takes a while to resolve, and positive COVID-19 test may still be positive after 11 days and few weeks to months after testing positive. Advised patient to continue medication as prescribed and follow-up with worsening or unresolved symptoms.

## 2020-06-08 LAB — COVID-19, FLU A+B NAA
Influenza A, NAA: NOT DETECTED
Influenza B, NAA: NOT DETECTED
SARS-CoV-2, NAA: NOT DETECTED

## 2020-06-15 ENCOUNTER — Other Ambulatory Visit: Payer: Self-pay

## 2020-06-15 ENCOUNTER — Encounter: Payer: Self-pay | Admitting: Nurse Practitioner

## 2020-06-15 ENCOUNTER — Ambulatory Visit (INDEPENDENT_AMBULATORY_CARE_PROVIDER_SITE_OTHER): Payer: Medicare Other | Admitting: Nurse Practitioner

## 2020-06-15 VITALS — BP 106/73 | HR 87 | Temp 97.5°F | Ht 64.0 in | Wt 194.0 lb

## 2020-06-15 DIAGNOSIS — R102 Pelvic and perineal pain: Secondary | ICD-10-CM | POA: Diagnosis not present

## 2020-06-15 LAB — MICROSCOPIC EXAMINATION: RBC, Urine: NONE SEEN /hpf (ref 0–2)

## 2020-06-15 LAB — URINALYSIS, ROUTINE W REFLEX MICROSCOPIC
Bilirubin, UA: NEGATIVE
Glucose, UA: NEGATIVE
Ketones, UA: NEGATIVE
Nitrite, UA: NEGATIVE
Protein,UA: NEGATIVE
RBC, UA: NEGATIVE
Specific Gravity, UA: 1.015 (ref 1.005–1.030)
Urobilinogen, Ur: 0.2 mg/dL (ref 0.2–1.0)
pH, UA: 6.5 (ref 5.0–7.5)

## 2020-06-15 MED ORDER — NAPROXEN 500 MG PO TABS: 500.0000 mg | ORAL_TABLET | Freq: Two times a day (BID) | ORAL | 0 refills | Status: DC

## 2020-06-15 NOTE — Progress Notes (Signed)
Acute Office Visit  Subjective:    Patient ID: Leah [name], female    DOB: December 14, 1950, 70 y.o.   MRN: [id number]  Chief Complaint  Patient presents with  . Pelvic Pain    Pelvic Pain The patient's primary symptoms include pelvic pain. This is a new problem. The current episode started 1 to 4 weeks ago. The problem occurs constantly. The problem has been rapidly worsening. The pain is severe. The problem affects the left side. She is not pregnant. Pertinent negatives include no chills, dysuria, fever, flank pain or frequency. Nothing aggravates the symptoms. She has tried nothing for the symptoms. She is not sexually active. She is postmenopausal.    Past Medical History:  Diagnosis Date  . Allergy   . Allergy to alpha-gal   . Anxiety   . Arthritis   . Asthma   . CAD (coronary artery disease)   . Colon polyp   . COVID-19 virus infection 05/2020  . Depression   . Food allergy   . GERD (gastroesophageal reflux disease)   . Hyperlipidemia   . Hypertension   . Urticaria     Past Surgical History:  Procedure Laterality Date  . ABDOMINAL HYSTERECTOMY    . Carpel Tunnel    . CHOLECYSTECTOMY    . COLONOSCOPY  02/08/2005   [id number] hemorrhoids otherwise normal rectum/ A few scattered, shallow, left-sided sigmoid diverticula/The remainder of the colonic mucosa appeared normal  . COLONOSCOPY WITH ESOPHAGOGASTRODUODENOSCOPY (EGD) N/A 11/29/2012   Procedure: COLONOSCOPY WITH ESOPHAGOGASTRODUODENOSCOPY (EGD);  Surgeon: Daneil Dolin, MD;  Location: [facility];  Service: Endoscopy;  Laterality: N/A;  10:45  . CYST REMOVAL TRUNK    . HERNIA REPAIR     ventral X 2 with mesh, last one 2011 ([place]). complicated with 2 week hospital stay  . HERNIA REPAIR     with mesh  . NISSEN FUNDOPLICATION  7408  . POLYPECTOMY    . ROTATOR CUFF REPAIR Left 04/04/2019  . TUBAL LIGATION      Family History  Problem Relation Age of Onset  . Heart disease Mother   . Alzheimer's  disease Mother   . Diabetes Mother   . Other Father        deceased age 52 from some sort of GI problems but no malignancy  . Diabetes Sister   . Leukemia Brother        CLL  . Asthma Sister   . Colon polyps Sister   . Diabetes Brother   . Cancer Cousin        pancreatic  . Myasthenia gravis Daughter   . Diabetes Daughter   . Cancer Cousin        pancreatic  . Colon cancer Neg Hx   . Stomach cancer Neg Hx   . Esophageal cancer Neg Hx   . Rectal cancer Neg Hx     Social History   Socioeconomic History  . Marital status: Divorced    Spouse name: Not on file  . Number of children: 3  . Years of education: 12+  . Highest education level: Some college, no degree  Occupational History  . Occupation: private duty Manufacturing systems engineer  Tobacco Use  . Smoking status: Former Smoker    Packs/day: 0.50    Years: 28.00    Pack years: 14.00    Types: Cigarettes    Quit date: 01/17/1996    Years since quitting: 24.4  . Smokeless tobacco: Never Used  Vaping Use  .  Vaping Use: Never used  Substance and Sexual Activity  . Alcohol use: Yes    Alcohol/week: 2.0 standard drinks    Types: 2 Glasses of wine per week    Comment: occasional use of wine   . Drug use: No  . Sexual activity: Not Currently    Partners: Male    Birth control/protection: Surgical  Other Topics Concern  . Not on file  Social History Narrative  . Not on file   Social Determinants of Health   Financial Resource Strain: Low Risk   . Difficulty of Paying Living Expenses: Not hard at all  Food Insecurity: No Food Insecurity  . Worried About Charity fundraiser in the Last Year: Never true  . Ran Out of Food in the Last Year: Never true  Transportation Needs: No Transportation Needs  . Lack of Transportation (Medical): No  . Lack of Transportation (Non-Medical): No  Physical Activity: Not on file  Stress: Not on file  Social Connections: Moderately Integrated  . Frequency of Communication with Friends and  Family: More than three times a week  . Frequency of Social Gatherings with Friends and Family: More than three times a week  . Attends Religious Services: More than 4 times per year  . Active Member of Clubs or Organizations: Yes  . Attends Archivist Meetings: More than 4 times per year  . Marital Status: Divorced  Human resources officer Violence: Not on file    Outpatient Medications Prior to Visit  Medication Sig Dispense Refill  . albuterol (PROAIR HFA) 108 (90 Base) MCG/ACT inhaler Inhale 2 puffs into the lungs every 4 (four) hours as needed for wheezing or shortness of breath. 8.5 g 3  . albuterol (PROVENTIL) (2.5 MG/3ML) 0.083% nebulizer solution Take 3 mLs (2.5 mg total) by nebulization every 6 (six) hours as needed for wheezing or shortness of breath. 150 mL 6  . Albuterol Sulfate 2.5 MG/0.5ML NEBU Take 0.5 mLs (2.5 mg total) by nebulization every 6 (six) hours as needed. 20 mL 3  . aspirin 81 MG tablet Take 81 mg by mouth daily.    . benzonatate (TESSALON) 100 MG capsule Take 1 capsule (100 mg total) by mouth 2 (two) times daily as needed for cough. 20 capsule 0  . calcium carbonate (TUMS) 500 MG chewable tablet Chew 1 tablet (200 mg of elemental calcium total) by mouth as needed for indigestion or heartburn.    . Cholecalciferol (VITAMIN D) 2000 UNITS tablet Take 2,000 Units by mouth daily.    . cyclobenzaprine (FLEXERIL) 5 MG tablet Take 1 tablet (5 mg total) by mouth 3 (three) times daily as needed for muscle spasms. 30 tablet 1  . EPINEPHrine 0.3 mg/0.3 mL IJ SOAJ injection Inject into the muscle once.    . famotidine (PEPCID) 40 MG tablet Take 1 tablet (40 mg total) by mouth daily. 90 tablet 3  . fish oil-omega-3 fatty acids 1000 MG capsule Take 2 g by mouth daily.     . fluticasone (FLONASE) 50 MCG/ACT nasal spray Place 2 sprays into both nostrils daily. 16 g 6  . ibuprofen (ADVIL) 800 MG tablet Take 1 tablet (800 mg total) by mouth at bedtime as needed for moderate pain  or cramping. 30 tablet 0  . loratadine (CLARITIN) 10 MG tablet Take 10 mg by mouth 2 (two) times daily.    Marland Kitchen losartan-hydrochlorothiazide (HYZAAR) 100-12.5 MG tablet TAKE ONE TABLET BY MOUTH DAILY 90 tablet 1  . Multiple Vitamins-Minerals (EMERGEN-C IMMUNE  PO) Take by mouth.    . pantoprazole (PROTONIX) 40 MG tablet Take 1 tablet (40 mg total) by mouth daily. For stomach 30 tablet 11  . rosuvastatin (CRESTOR) 20 MG tablet Take 1 tablet (20 mg total) by mouth daily. 90 tablet 3   No facility-administered medications prior to visit.    Allergies  Allergen Reactions  . Codeine Nausea And Vomiting  . Levaquin [Levofloxacin] Other (See Comments)    Achilles tendon pain  . Meat [Alpha-Gal]   . Penicillins Other (See Comments)    Does not know  . Praluent [Alirocumab]     Rash and myalgias  . Strawberry (Diagnostic)     Review of Systems  Constitutional: Negative for chills and fever.  Respiratory: Negative.   Cardiovascular: Negative.   Genitourinary: Positive for pelvic pain. Negative for dysuria, flank pain and frequency.  All other systems reviewed and are negative.      Objective:    Physical Exam Vitals reviewed.  Constitutional:      Appearance: Normal appearance.  HENT:     Head: Normocephalic.     Nose: Nose normal.  Eyes:     Conjunctiva/sclera: Conjunctivae normal.  Cardiovascular:     Rate and Rhythm: Normal rate and regular rhythm.  Pulmonary:     Effort: Pulmonary effort is normal.     Breath sounds: Normal breath sounds.  Abdominal:     General: Bowel sounds are normal.     Palpations: Abdomen is soft.     Tenderness: There is abdominal tenderness in the left lower quadrant. There is no right CVA tenderness or left CVA tenderness.  Skin:    Findings: No erythema or rash.  Neurological:     Mental Status: She is alert and oriented to person, place, and time.  Psychiatric:        Behavior: Behavior normal.     BP 106/73   Pulse 87   Temp (!) 97.5  F (36.4 C) (Temporal)   Ht 5\' 4"  (1.626 m)   Wt 194 lb (88 kg)   SpO2 100%   BMI 33.30 kg/m  Wt Readings from Last 3 Encounters:  06/15/20 194 lb (88 kg)  06/07/20 192 lb (87.1 kg)  05/19/20 191 lb (86.6 kg)    Health Maintenance Due  Topic Date Due  . Zoster Vaccines- Shingrix (1 of 2) Never done  . COLON CANCER SCREENING ANNUAL FOBT  02/26/2019  . COVID-19 Vaccine (4 - Booster for Moderna series) 12/17/2019    There are no preventive care reminders to display for this patient.   Lab Results  Component Value Date   TSH 2.240 03/31/2019   Lab Results  Component Value Date   WBC 3.2 (L) 04/05/2020   HGB 12.7 04/05/2020   HCT 38.8 04/05/2020   MCV 83 04/05/2020   PLT 294 04/05/2020   Lab Results  Component Value Date   NA 143 03/10/2020   K 4.1 03/10/2020   CO2 25 03/10/2020   GLUCOSE 107 (H) 03/10/2020   BUN 12 03/10/2020   CREATININE 1.00 04/15/2020   BILITOT 0.2 03/10/2020   ALKPHOS 107 03/10/2020   AST 24 03/10/2020   ALT 30 03/10/2020   PROT 6.3 03/10/2020   ALBUMIN 4.2 03/10/2020   CALCIUM 9.1 03/10/2020   ANIONGAP 10 08/22/2018   Lab Results  Component Value Date   CHOL 162 04/05/2020   Lab Results  Component Value Date   HDL 54 04/05/2020   Lab Results  Component Value  Date   LDLCALC 93 04/05/2020   Lab Results  Component Value Date   TRIG 78 04/05/2020   Lab Results  Component Value Date   CHOLHDL 3.0 04/05/2020   Lab Results  Component Value Date   HGBA1C 5.9 03/31/2019       Assessment & Plan:   Problem List Items Addressed This Visit      Other   Pelvic pain - Primary    Symptoms of pelvic pain in the last week and a half.  Patient is reporting pain of a 10 out of 10 from a scale of 0-10.  Pain is severe with no nausea, fever, chills or dysuria.  Patient is not sexually active, this is new.  No CVA tenderness, or vaginal discharge.  Patient is limping and guarding.  Completed urinalysis-results pending.  Transvaginal  ultrasound completed to rule out ovarian cyst.  Patient has a history of a partial hysterectomy with ovaries still intact. Education provided to patient with printed handouts given.  Naproxen 500 mg tablet twice daily as needed for pain, warm compress.  Follow-up with worsening unresolved symptoms.  Rx sent to pharmacy.      Relevant Medications   naproxen (NAPROSYN) 500 MG tablet   Other Relevant Orders   Urinalysis, Routine w reflex microscopic   Urinalysis, Routine w reflex microscopic   US Pelvic Complete With Transvaginal       Meds ordered this encounter  Medications  . naproxen (NAPROSYN) 500 MG tablet    Sig: Take 1 tablet (500 mg total) by mouth 2 (two) times daily with a meal.    Dispense:  30 tablet    Refill:  0    Order Specific Question:   Supervising Provider    Answer:   Janora Norlander [[id number]]     Ivy Lynn, NP

## 2020-06-15 NOTE — Assessment & Plan Note (Signed)
Symptoms of pelvic pain in the last week and a half.  Patient is reporting pain of a 10 out of 10 from a scale of 0-10.  Pain is severe with no nausea, fever, chills or dysuria.  Patient is not sexually active, this is new.  No CVA tenderness, or vaginal discharge.  Patient is limping and guarding.  Completed urinalysis-results pending.  Transvaginal ultrasound completed to rule out ovarian cyst.  Patient has a history of a partial hysterectomy with ovaries still intact. Education provided to patient with printed handouts given.  Naproxen 500 mg tablet twice daily as needed for pain, warm compress.  Follow-up with worsening unresolved symptoms.  Rx sent to pharmacy.

## 2020-06-15 NOTE — Patient Instructions (Signed)
Pelvic Pain, Female Pelvic pain is pain in your lower belly (abdomen), below your belly button and between your hips. The pain may start suddenly (be acute), keep coming back (be recurring), or last a long time (become chronic). Pelvic pain that lasts longer than 6 months is called chronic pelvic pain. There are many causes of pelvic pain. Sometimes the cause of pelvic pain is not known. Follow these instructions at home:  Take over-the-counter and prescription medicines only as told by your doctor.  Rest as told by your doctor.  Do not have sex if it hurts.  Keep a journal of your pelvic pain. Write down: ? When the pain started. ? Where the pain is located. ? What seems to make the pain better or worse, such as food or your period (menstrual cycle). ? Any symptoms you have along with the pain.  Keep all follow-up visits as told by your doctor. This is important.   Contact a doctor if:  Medicine does not help your pain.  Your pain comes back.  You have new symptoms.  You have unusual discharge or bleeding from your vagina.  You have a fever or chills.  You are having trouble pooping (constipation).  You have blood in your pee (urine) or poop (stool).  Your pee smells bad.  You feel weak or light-headed. Get help right away if:  You have sudden pain that is very bad.  Your pain keeps getting worse.  You have very bad pain and also have any of these symptoms: ? A fever. ? Feeling sick to your stomach (nausea). ? Throwing up (vomiting). ? Being very sweaty.  You pass out (lose consciousness). Summary  Pelvic pain is pain in your lower belly (abdomen), below your belly button and between your hips.  There are many possible causes of pelvic pain.  Keep a journal of your pelvic pain. This information is not intended to replace advice given to you by your health care provider. Make sure you discuss any questions you have with your health care provider. Document  Revised: 06/20/2017 Document Reviewed: 06/20/2017 Elsevier Patient Education  [place].

## 2020-06-18 ENCOUNTER — Other Ambulatory Visit: Payer: Self-pay | Admitting: Nurse Practitioner

## 2020-06-18 ENCOUNTER — Ambulatory Visit (HOSPITAL_COMMUNITY)
Admission: RE | Admit: 2020-06-18 | Discharge: 2020-06-18 | Disposition: A | Discharge status: Home / Self Care | Payer: Medicare Other | Source: Ambulatory Visit | Attending: Nurse Practitioner | Admitting: Nurse Practitioner

## 2020-06-18 ENCOUNTER — Other Ambulatory Visit: Payer: Self-pay

## 2020-06-18 DIAGNOSIS — R102 Pelvic and perineal pain: Secondary | ICD-10-CM | POA: Diagnosis not present

## 2020-06-18 DIAGNOSIS — Z90710 Acquired absence of both cervix and uterus: Secondary | ICD-10-CM | POA: Diagnosis not present

## 2020-06-21 ENCOUNTER — Ambulatory Visit (HOSPITAL_COMMUNITY): Payer: Medicare Other

## 2020-06-21 ENCOUNTER — Other Ambulatory Visit: Payer: Medicare Other

## 2020-07-02 ENCOUNTER — Encounter: Payer: Self-pay | Admitting: Gastroenterology

## 2020-07-02 ENCOUNTER — Ambulatory Visit (INDEPENDENT_AMBULATORY_CARE_PROVIDER_SITE_OTHER): Payer: Medicare Other | Admitting: Gastroenterology

## 2020-07-02 VITALS — BP 138/90 | HR 88 | Ht 63.25 in | Wt 195.5 lb

## 2020-07-02 DIAGNOSIS — R1084 Generalized abdominal pain: Secondary | ICD-10-CM | POA: Diagnosis not present

## 2020-07-02 DIAGNOSIS — R140 Abdominal distension (gaseous): Secondary | ICD-10-CM

## 2020-07-02 DIAGNOSIS — T1502XA Foreign body in cornea, left eye, initial encounter: Secondary | ICD-10-CM | POA: Diagnosis not present

## 2020-07-02 DIAGNOSIS — R143 Flatulence: Secondary | ICD-10-CM

## 2020-07-02 MED ORDER — HYOSCYAMINE SULFATE 0.125 MG SL SUBL: 0.1250 mg | SUBLINGUAL_TABLET | Freq: Three times a day (TID) | SUBLINGUAL | 1 refills | Status: DC | PRN

## 2020-07-02 NOTE — Patient Instructions (Signed)
We have sent the following medications to your pharmacy for you to pick up at your convenience: Levsin 0.125 mg SL tablet every 6-8 hours as needed.   Start IBgard daily.   If you are age 69 or older, your body mass index should be between 23-30. Your Body mass index is 34.36 kg/m. If this is out of the aforementioned range listed, please consider follow up with your Primary Care Provider.  If you are age 103 or younger, your body mass index should be between 19-25. Your Body mass index is 34.36 kg/m. If this is out of the aformentioned range listed, please consider follow up with your Primary Care Provider.   __________________________________________________________  The [place] [facility] providers would like to encourage you to use [facility] to communicate with providers for non-urgent requests or questions.  Due to long hold times on the telephone, sending your provider a message by [facility] may be a faster and more efficient way to get a response.  Please allow 48 business hours for a response.  Please remember that this is for non-urgent requests.

## 2020-07-02 NOTE — Progress Notes (Signed)
07/02/2020 Leah [name] [id number] 03-29-1950   HISTORY OF PRESENT ILLNESS: This is a pleasant 70 year old who is a patient of Dr. Ardis Hughs.  She is here today with complaints of abdominal pain in both her upper and lower abdomen.  She tells me that the one day she woke up from sleep and the pain is so severe that she could barely get out of bed.  She had a CT scan of the abdomen pelvis with contrast that showed a renal lesion suspicious for renal cell carcinoma.  She then had a follow-up MRI as well to confirm that.  She is scheduled for surgery next month for that.  Imaging did not show any acute issues.  Overall she seems to be moving her bowels okay.  She says that she used Gas-X and that really seemed to help with the pain.  She says that she did not even know about this appointment until 2 days ago when she got the reminder call.  She says she was not sure if she really needed to come.  Colonoscopy September 2019 showed the following: - One 2 mm polyp in the ascending colon, removed with a cold snare. Resected and retrieved. - Diverticulosis in the left colon. - The examination was otherwise normal on direct and retroflexion views.  Pathology was a tubular adenoma.  EGD September 2019 showed the following: - Typical appearing post fundoplication anatomy. The UGI tract was otherwise normal.   Past Medical History:  Diagnosis Date   Allergy    Allergy to alpha-gal    Anxiety    Arthritis    Asthma    CAD (coronary artery disease)    Colon polyp    COVID-19 virus infection 05/2020   Depression    Food allergy    GERD (gastroesophageal reflux disease)    Hyperlipidemia    Hypertension    Renal cell carcinoma (HCC)    Urticaria    Past Surgical History:  Procedure Laterality Date   ABDOMINAL HYSTERECTOMY     Carpel Tunnel     CHOLECYSTECTOMY     COLONOSCOPY  02/08/2005   EPP:IRJJOACZ hemorrhoids otherwise normal rectum/ A few scattered, shallow, left-sided sigmoid  diverticula/The remainder of the colonic mucosa appeared normal   COLONOSCOPY WITH ESOPHAGOGASTRODUODENOSCOPY (EGD) N/A 11/29/2012   Procedure: COLONOSCOPY WITH ESOPHAGOGASTRODUODENOSCOPY (EGD);  Surgeon: Daneil Dolin, MD;  Location: [facility];  Service: Endoscopy;  Laterality: N/A;  10:45   CYST REMOVAL TRUNK     HERNIA REPAIR     ventral X 2 with mesh, last one 2011 ([place]). complicated with 2 week hospital stay   [place]     with mesh   NISSEN FUNDOPLICATION  6606   POLYPECTOMY     ROTATOR CUFF REPAIR Left 04/04/2019   TUBAL LIGATION      reports that she quit smoking about 24 years ago. Her smoking use included cigarettes. She has a 14.00 pack-year smoking history. She has never used smokeless tobacco. She reports current alcohol use of about 2.0 standard drinks of alcohol per week. She reports that she does not use drugs. family history includes Alzheimer's disease in her mother; Asthma in her sister; Cancer in her cousin and cousin; Colon polyps in her sister; Diabetes in her brother, daughter, mother, and sister; Heart disease in her mother; Leukemia in her brother; Myasthenia gravis in her daughter; Other in her father. Allergies  Allergen Reactions   Codeine Nausea And Vomiting   Levaquin [Levofloxacin] Other (  See Comments)    Achilles tendon pain   Meat [Alpha-Gal]    Penicillins Other (See Comments)    Does not know   Praluent [Alirocumab]     Rash and myalgias   Strawberry (Diagnostic)       Outpatient Encounter Medications as of 07/02/2020  Medication Sig   albuterol (PROAIR HFA) 108 (90 Base) MCG/ACT inhaler Inhale 2 puffs into the lungs every 4 (four) hours as needed for wheezing or shortness of breath.   albuterol (PROVENTIL) (2.5 MG/3ML) 0.083% nebulizer solution Take 3 mLs (2.5 mg total) by nebulization every 6 (six) hours as needed for wheezing or shortness of breath.   Albuterol Sulfate 2.5 MG/0.5ML NEBU Take 0.5 mLs (2.5 mg total) by nebulization  every 6 (six) hours as needed.   aspirin 81 MG tablet Take 81 mg by mouth daily.   benzonatate (TESSALON) 100 MG capsule Take 1 capsule (100 mg total) by mouth 2 (two) times daily as needed for cough.   calcium carbonate (TUMS) 500 MG chewable tablet Chew 1 tablet (200 mg of elemental calcium total) by mouth as needed for indigestion or heartburn.   Cholecalciferol (VITAMIN D) 2000 UNITS tablet Take 2,000 Units by mouth daily.   cyclobenzaprine (FLEXERIL) 5 MG tablet Take 1 tablet (5 mg total) by mouth 3 (three) times daily as needed for muscle spasms.   famotidine (PEPCID) 40 MG tablet Take 1 tablet (40 mg total) by mouth daily. (Patient taking differently: Take 40 mg by mouth as needed.)   fish oil-omega-3 fatty acids 1000 MG capsule Take 2 g by mouth daily.    fluticasone (FLONASE) 50 MCG/ACT nasal spray Place 2 sprays into both nostrils daily.   ibuprofen (ADVIL) 800 MG tablet Take 1 tablet (800 mg total) by mouth at bedtime as needed for moderate pain or cramping.   loratadine (CLARITIN) 10 MG tablet Take 10 mg by mouth 2 (two) times daily.   losartan-hydrochlorothiazide (HYZAAR) 100-12.5 MG tablet TAKE ONE TABLET BY MOUTH DAILY   Multiple Vitamins-Minerals (EMERGEN-C IMMUNE PO) Take by mouth.   naproxen (NAPROSYN) 500 MG tablet Take 1 tablet (500 mg total) by mouth 2 (two) times daily with a meal.   pantoprazole (PROTONIX) 40 MG tablet Take 1 tablet (40 mg total) by mouth daily. For stomach   rosuvastatin (CRESTOR) 20 MG tablet Take 1 tablet (20 mg total) by mouth daily.   Simethicone (GAS-X PO) Take 2 tablets by mouth daily.   EPINEPHrine 0.3 mg/0.3 mL IJ SOAJ injection Inject into the muscle once. (Patient not taking: Reported on 07/02/2020)   No facility-administered encounter medications on file as of 07/02/2020.     REVIEW OF SYSTEMS  : All other systems reviewed and negative except where noted in the History of Present Illness.   PHYSICAL EXAM: BP 138/90 (BP Location: Left Arm,  Patient Position: Sitting, Cuff Size: Normal)   Pulse 88   Ht 5' 3.25" (1.607 m) Comment: height measured without shoes  Wt 195 lb 8 oz (88.7 kg)   BMI 34.36 kg/m  General: Well developed AA female in no acute distress Head: Normocephalic and atraumatic Eyes:  Sclerae anicteric, conjunctiva pink. Ears: Normal auditory acuity Lungs: Clear throughout to auscultation; no W/R/R. Heart: Regular rate and rhythm; no M/R/G. Abdomen: Soft, non-distended.  BS present.  Non-tender.  Multiple scars from previous surgeries noted on abdomen. Musculoskeletal: Symmetrical with no gross deformities  Skin: No lesions on visible extremities Extremities: No edema  Neurological: Alert oriented x 4, grossly non-focal  Psychological:  Alert and cooperative. Normal mood and affect  ASSESSMENT AND PLAN: *70 year-old female with complaints of generalized, both upper and lower abdominal pains with associated gas, much improved/about resolved with the use of Gas-X.  She has had both recent CT scan of the abdomen and pelvis and MRI of the abdomen that showed a renal cell carcinoma for which she is going to be having surgery, but no acute issues.  EGD and colonoscopy in September 2019 were unremarkable.  She has a lot of scars on her abdomen from previous surgeries.  She likely has adhesions/scar tissue and I wonder if maybe she gets intermittent partial obstructions from those.  Nonetheless, the Gas-X has helped her.  She may very well continue that as needed.  We also discussed even trying IBgard to see if that helps.  I am also going to send a prescription for Levsin 0.125 mg for her to take as needed.  This is sent to her pharmacy.  CC:  Janora Norlander, DO

## 2020-07-04 NOTE — Progress Notes (Signed)
I agree with the above note, plan 

## 2020-07-06 ENCOUNTER — Telehealth: Payer: Self-pay | Admitting: Family Medicine

## 2020-07-06 NOTE — Telephone Encounter (Signed)
Spoke with patient, she has upcoming surgery planned on 08/12/20 to remove part of the kidney for possible renal carcinoma.  She is unsure if she wants to have surgery and would like to talk to Dr. Lajuana Ripple and get her opinion.  Appointment scheduled 07/27/20 at 4:15 pm.

## 2020-07-06 NOTE — Telephone Encounter (Signed)
Pt having surgery on July 28th and would like to talk to Dr Darnell Level before then because she wants a second opinion.

## 2020-07-26 ENCOUNTER — Ambulatory Visit (INDEPENDENT_AMBULATORY_CARE_PROVIDER_SITE_OTHER): Payer: Medicare Other

## 2020-07-26 VITALS — BP 170/90 | Ht 63.0 in | Wt 192.0 lb

## 2020-07-26 DIAGNOSIS — Z0000 Encounter for general adult medical examination without abnormal findings: Secondary | ICD-10-CM | POA: Diagnosis not present

## 2020-07-26 NOTE — Progress Notes (Signed)
Subjective:   Leah [name] is a 70 y.o. female who presents for Medicare Annual (Subsequent) preventive examination.  Virtual Visit via Telephone Note  I connected with  Leah [name] on 07/26/20 at  4:15 PM EDT by telephone and verified that I am speaking with the correct person using two identifiers.  Location: Patient: Home Provider: [facility] Persons participating in the virtual visit: patient/Nurse Health Advisor   I discussed the limitations, risks, security and privacy concerns of performing an evaluation and management service by telephone and the availability of in person appointments. The patient expressed understanding and agreed to proceed.  Interactive audio and video telecommunications were attempted between this nurse and patient, however failed, due to patient having technical difficulties OR patient did not have access to video capability.  We continued and completed visit with audio only.  Some vital signs may be absent or patient reported.   Leah Hascall E Laporcha Marchesi, LPN   Review of Systems     Cardiac Risk Factors include: advanced age (>32men, >61 women);dyslipidemia;hypertension;obesity (BMI >30kg/m2);sedentary lifestyle     Objective:    Today's Vitals   07/26/20 1559  BP: (!) 170/90  Weight: 192 lb (87.1 kg)  Height: 5\' 3"  (1.6 m)   Body mass index is 34.01 kg/m.  Advanced Directives 07/26/2020 03/26/2019 08/08/2017 01/17/2017 11/29/2012  Does Patient Have a Medical Advance Directive? No No No No Patient does not have advance directive;Patient would not like information  Does patient want to make changes to medical advance directive? - - Yes (MAU/Ambulatory/Procedural Areas - Information given) - -  Would patient like information on creating a medical advance directive? No - Patient declined No - Patient declined - No - Patient declined -  Pre-existing out of facility DNR order (yellow form or pink MOST form) - - - - No    Current Medications  (verified) Outpatient Encounter Medications as of 07/26/2020  Medication Sig   albuterol (PROAIR HFA) 108 (90 Base) MCG/ACT inhaler Inhale 2 puffs into the lungs every 4 (four) hours as needed for wheezing or shortness of breath.   albuterol (PROVENTIL) (2.5 MG/3ML) 0.083% nebulizer solution Take 3 mLs (2.5 mg total) by nebulization every 6 (six) hours as needed for wheezing or shortness of breath.   Albuterol Sulfate 2.5 MG/0.5ML NEBU Take 0.5 mLs (2.5 mg total) by nebulization every 6 (six) hours as needed.   aspirin 81 MG tablet Take 81 mg by mouth daily.   benzonatate (TESSALON) 100 MG capsule Take 1 capsule (100 mg total) by mouth 2 (two) times daily as needed for cough.   calcium carbonate (TUMS) 500 MG chewable tablet Chew 1 tablet (200 mg of elemental calcium total) by mouth as needed for indigestion or heartburn.   Cholecalciferol (VITAMIN D) 2000 UNITS tablet Take 2,000 Units by mouth daily.   famotidine (PEPCID) 40 MG tablet Take 1 tablet (40 mg total) by mouth daily. (Patient taking differently: Take 40 mg by mouth as needed.)   fish oil-omega-3 fatty acids 1000 MG capsule Take 2 g by mouth daily.    fluticasone (FLONASE) 50 MCG/ACT nasal spray Place 2 sprays into both nostrils daily.   ibuprofen (ADVIL) 800 MG tablet Take 1 tablet (800 mg total) by mouth at bedtime as needed for moderate pain or cramping.   loratadine (CLARITIN) 10 MG tablet Take 10 mg by mouth 2 (two) times daily.   losartan-hydrochlorothiazide (HYZAAR) 100-12.5 MG tablet TAKE ONE TABLET BY MOUTH DAILY   Multiple Vitamins-Minerals (EMERGEN-C IMMUNE PO)  Take by mouth.   pantoprazole (PROTONIX) 40 MG tablet Take 1 tablet (40 mg total) by mouth daily. For stomach   rosuvastatin (CRESTOR) 20 MG tablet Take 1 tablet (20 mg total) by mouth daily.   Simethicone (GAS-X PO) Take 2 tablets by mouth daily.   cyclobenzaprine (FLEXERIL) 5 MG tablet Take 1 tablet (5 mg total) by mouth 3 (three) times daily as needed for muscle  spasms. (Patient not taking: Reported on 07/26/2020)   EPINEPHrine 0.3 mg/0.3 mL IJ SOAJ injection Inject into the muscle once. (Patient not taking: Reported on 07/26/2020)   hyoscyamine (LEVSIN SL) 0.125 MG SL tablet Place 1 tablet (0.125 mg total) under the tongue every 8 (eight) hours as needed. (Patient not taking: Reported on 07/26/2020)   naproxen (NAPROSYN) 500 MG tablet Take 1 tablet (500 mg total) by mouth 2 (two) times daily with a meal. (Patient not taking: Reported on 07/26/2020)   No facility-administered encounter medications on file as of 07/26/2020.    Allergies (verified) Codeine, Levaquin [levofloxacin], Meat [alpha-gal], Penicillins, Praluent [alirocumab], and Strawberry (diagnostic)   History: Past Medical History:  Diagnosis Date   Allergy    Allergy to alpha-gal    Anxiety    Arthritis    Asthma    CAD (coronary artery disease)    Colon polyp    COVID-19 virus infection 05/2020   Depression    Food allergy    GERD (gastroesophageal reflux disease)    Hyperlipidemia    Hypertension    Renal cell carcinoma ([place])    Urticaria    Past Surgical History:  Procedure Laterality Date   ABDOMINAL HYSTERECTOMY     Carpel Tunnel     CHOLECYSTECTOMY     COLONOSCOPY  02/08/2005   IEP:PIRJJOAC hemorrhoids otherwise normal rectum/ A few scattered, shallow, left-sided sigmoid diverticula/The remainder of the colonic mucosa appeared normal   COLONOSCOPY WITH ESOPHAGOGASTRODUODENOSCOPY (EGD) N/A 11/29/2012   Procedure: COLONOSCOPY WITH ESOPHAGOGASTRODUODENOSCOPY (EGD);  Surgeon: Daneil Dolin, MD;  Location: [facility];  Service: Endoscopy;  Laterality: N/A;  10:45   CYST REMOVAL TRUNK     HERNIA REPAIR     ventral X 2 with mesh, last one 2011 ([place]). complicated with 2 week hospital stay   [place]     with mesh   NISSEN FUNDOPLICATION  1660   POLYPECTOMY     ROTATOR CUFF REPAIR Left 04/04/2019   TUBAL LIGATION     Family History  Problem Relation Age  of Onset   Heart disease Mother    Alzheimer's disease Mother    Diabetes Mother    Other Father        deceased age 60 from some sort of GI problems but no malignancy   Diabetes Sister    Leukemia Brother        CLL   Asthma Sister    Colon polyps Sister    Diabetes Brother    Cancer Cousin        pancreatic   Myasthenia gravis Daughter    Diabetes Daughter    Cancer Cousin        pancreatic   Colon cancer Neg Hx    Stomach cancer Neg Hx    Esophageal cancer Neg Hx    Rectal cancer Neg Hx    Social History   Socioeconomic History   Marital status: Divorced    Spouse name: Not on file   Number of children: 3   Years of education: 12+  Highest education level: Some college, no degree  Occupational History   Occupation: private duty Manufacturing systems engineer  Tobacco Use   Smoking status: Former    Packs/day: 0.50    Years: 28.00    Pack years: 14.00    Types: Cigarettes    Quit date: 01/17/1996    Years since quitting: 24.5   Smokeless tobacco: Never  Vaping Use   Vaping Use: Never used  Substance and Sexual Activity   Alcohol use: Yes    Alcohol/week: 2.0 standard drinks    Types: 2 Glasses of wine per week    Comment: occasional use of wine    Drug use: No   Sexual activity: Not Currently    Partners: Male    Birth control/protection: Surgical  Other Topics Concern   Not on file  Social History Narrative   Lives alone.    Social Determinants of Health   Financial Resource Strain: Low Risk    Difficulty of Paying Living Expenses: Not hard at all  Food Insecurity: No Food Insecurity   Worried About Charity fundraiser in the Last Year: Never true   [place] in the Last Year: Never true  Transportation Needs: No Transportation Needs   Lack of Transportation (Medical): No   Lack of Transportation (Non-Medical): No  Physical Activity: Insufficiently Active   Days of Exercise per Week: 7 days   Minutes of Exercise per Session: 20 min  Stress: No Stress  Concern Present   Feeling of Stress : Not at all  Social Connections: Moderately Integrated   Frequency of Communication with Friends and Family: More than three times a week   Frequency of Social Gatherings with Friends and Family: More than three times a week   Attends Religious Services: More than 4 times per year   Active Member of [employer] or Organizations: Yes   Attends Music therapist: More than 4 times per year   Marital Status: Divorced    Tobacco Counseling Counseling given: Not Answered   Clinical Intake:  Pre-visit preparation completed: Yes  Pain : No/denies pain     BMI - recorded: 34.01 Nutritional Status: BMI > 30  Obese Nutritional Risks: Nausea/ vomitting/ diarrhea Diabetes: No  How often do you need to have someone help you when you read instructions, pamphlets, or other written materials from your doctor or pharmacy?: 1 - Never  Diabetic? No  Interpreter Needed?: No  Information entered by :: Elston Aldape, LPN   Activities of Daily Living In your present state of health, do you have any difficulty performing the following activities: 07/26/2020 12/23/2019  Hearing? N N  Vision? N N  Difficulty concentrating or making decisions? N N  Walking or climbing stairs? N N  Dressing or bathing? N N  Doing errands, shopping? N N  Preparing Food and eating ? N N  Using the Toilet? N N  In the past six months, have you accidently leaked urine? N N  Do you have problems with loss of bowel control? N N  Managing your Medications? N N  Managing your Finances? N N  Housekeeping or managing your Housekeeping? N N  Some recent data might be hidden    Patient Care Team: Janora Norlander, DO as PCP - General (Family Medicine) Burnell Blanks, MD as PCP - Cardiology (Cardiology) Milus Banister, MD as Attending Physician (Gastroenterology) McKenzie, Candee Furbish, MD as Consulting Physician (Urology) Netta Cedars, MD as Consulting Physician  (Orthopedic  Surgery) Jola Schmidt, MD as Consulting Physician (Ophthalmology)  Indicate any recent Medical Services you may have received from other than [facility] providers in the past year (date may be approximate).     Assessment:   This is a routine wellness examination for Mariona.  Hearing/Vision screen Hearing Screening - Comments:: Denies hearing difficulties  Vision Screening - Comments:: Wears eyeglasses - up to date with annual eye exam with Dr Valetta Close  Dietary issues and exercise activities discussed: Current Exercise Habits: Home exercise routine, Type of exercise: walking;strength training/weights, Time (Minutes): 30, Frequency (Times/Week): 7, Weekly Exercise (Minutes/Week): 210, Intensity: Mild, Exercise limited by: orthopedic condition(s)   Goals Addressed             This Visit's Progress    DIET - INCREASE WATER INTAKE   On track    Try to drink 6-8 glasses of water daily      Increase physical activity   On track    Start exercising again Find a companion       Prevent falls   On track    Stay active        Depression Screen PHQ 2/9 Scores 07/26/2020 06/07/2020 04/05/2020 03/10/2020 12/31/2019 10/28/2019 03/28/2019  PHQ - 2 Score 0 2 0 0 0 0 0  PHQ- 9 Score 0 6 - - 0 - 3    Fall Risk Fall Risk  07/26/2020 04/05/2020 03/10/2020 12/31/2019 10/28/2019  Falls in the past year? 0 0 0 0 0  Number falls in past yr: 0 - - - -  Injury with Fall? 0 - - - -  Risk for fall due to : Impaired vision;No Fall Risks - - - -  Follow up Falls prevention discussed - Falls evaluation completed - -    FALL RISK PREVENTION PERTAINING TO THE HOME:  Any stairs in or around the home? Yes  If so, are there any without handrails? No  Home free of loose throw rugs in walkways, pet beds, electrical cords, etc? Yes  Adequate lighting in your home to reduce risk of falls? Yes   ASSISTIVE DEVICES UTILIZED TO PREVENT FALLS:  Life alert? No  Use of a cane, walker or w/c? No  Grab  bars in the bathroom? Yes  Shower chair or bench in shower? No  Elevated toilet seat or a handicapped toilet? No   TIMED UP AND GO:  Was the test performed? No . Telephonic visit  Cognitive Function: Normal cognitive status assessed by direct observation by this Nurse Health Advisor. No abnormalities found.    MMSE - Mini Mental State Exam 08/08/2017  Orientation to time 5  Orientation to Place 5  Registration 3  Attention/ Calculation 5  Recall 3  Language- name 2 objects 2  Language- repeat 1  Language- follow 3 step command 3  Language- read & follow direction 1  Write a sentence 1  Copy design 1  Total score 30     6CIT Screen 03/26/2019  What Year? 0 points  What month? 0 points  What time? 0 points  Count back from 20 0 points  Months in reverse 0 points  Repeat phrase 0 points  Total Score 0    Immunizations Immunization History  Administered Date(s) Administered   Fluad Quad(high Dose 65+) 12/11/2018, 10/28/2019   Influenza,inj,Quad PF,6+ Mos 10/08/2012, 11/26/2013, 10/22/2014, 10/27/2015, 10/18/2016   Influenza,inj,quad, With Preservative 12/11/2018   Influenza-Unspecified 11/10/2016   Moderna Sars-Covid-2 Vaccination 02/21/2019, 03/22/2019, 09/17/2019   Pneumococcal Conjugate-13 12/18/2018  Pneumococcal Polysaccharide-23 10/17/2011, 04/05/2020   Td 02/16/2016   Tdap 02/16/2016    TDAP status: Up to date  Flu Vaccine status: Up to date  Pneumococcal vaccine status: Up to date  Covid-19 vaccine status: Information provided on how to obtain vaccines.   Qualifies for Shingles Vaccine? Yes   Zostavax completed No   Shingrix Completed?: No.    Education has been provided regarding the importance of this vaccine. Patient has been advised to call insurance company to determine out of pocket expense if they have not yet received this vaccine. Advised may also receive vaccine at local pharmacy or Health Dept. Verbalized acceptance and  understanding.  Screening Tests Health Maintenance  Topic Date Due   Zoster Vaccines- Shingrix (1 of 2) Never done   COLON CANCER SCREENING ANNUAL FOBT  02/26/2019   COVID-19 Vaccine (4 - Booster for Moderna series) 12/17/2019   INFLUENZA VACCINE  08/16/2020   MAMMOGRAM  09/09/2020   DEXA SCAN  06/14/2022   COLONOSCOPY (Pts 45-38yrs Insurance coverage will need to be confirmed)  10/10/2022   TETANUS/TDAP  02/15/2026   Hepatitis C Screening  Completed   PNA vac Low Risk Adult  Completed   HPV VACCINES  Aged Out    Health Maintenance  Health Maintenance Due  Topic Date Due   Zoster Vaccines- Shingrix (1 of 2) Never done   COLON CANCER SCREENING ANNUAL FOBT  02/26/2019   COVID-19 Vaccine (4 - Booster for Moderna series) 12/17/2019    Colorectal cancer screening: Type of screening: Colonoscopy. Completed 06/13/2017. Repeat every 5 years  Mammogram status: Completed 09/10/2019. Repeat every year  Bone Density status: Completed 06/13/2017. Results reflect: Bone density results: NORMAL. Repeat every 5 years.  Lung Cancer Screening: (Low Dose CT Chest recommended if Age 48-80 years, 30 pack-year currently smoking OR have quit w/in 15years.) does not qualify.   Additional Screening:  Hepatitis C Screening: does qualify; Completed 08/22/2018  Vision Screening: Recommended annual ophthalmology exams for early detection of glaucoma and other disorders of the eye. Is the patient up to date with their annual eye exam?  Yes  Who is the provider or what is the name of the office in which the patient attends annual eye exams? [facility] If pt is not established with a provider, would they like to be referred to a provider to establish care? No .   Dental Screening: Recommended annual dental exams for proper oral hygiene  Community Resource Referral / Chronic Care Management: CRR required this visit?  No   CCM required this visit?  No      Plan:     I have personally reviewed and noted  the following in the patient's chart:   Medical and social history Use of alcohol, tobacco or illicit drugs  Current medications and supplements including opioid prescriptions.  Functional ability and status Nutritional status Physical activity Advanced directives List of other physicians Hospitalizations, surgeries, and ER visits in previous 12 months Vitals Screenings to include cognitive, depression, and falls Referrals and appointments  In addition, I have reviewed and discussed with patient certain preventive protocols, quality metrics, and best practice recommendations. A written personalized care plan for preventive services as well as general preventive health recommendations were provided to patient.     Sandrea Hammond, LPN   [id number]/40/0867   Nurse Notes: None

## 2020-07-26 NOTE — Patient Instructions (Signed)
Leah [name] , Thank you for taking time to come for your Medicare Wellness Visit. I appreciate your ongoing commitment to your health goals. Please review the following plan we discussed and let me know if I can assist you in the future.   Screening recommendations/referrals: Colonoscopy: Done 10/09/2017 - Repeat in 5 years  Mammogram: Done 09/10/19 - Repeat annually  Bone Density: Done 06/13/2017 - Repeat in 5 years  Recommended yearly ophthalmology/optometry visit for glaucoma screening and checkup Recommended yearly dental visit for hygiene and checkup  Vaccinations: Influenza vaccine: Done 10/28/2019 - Repeat annually  Pneumococcal vaccine: Done 12/18/2018 & 04/05/2020 Tdap vaccine: Done 02/16/2016 - Repeat in 10 years  Shingles vaccine: DUE. Shingrix discussed. Please contact your pharmacy for coverage information.     Covid-19:Done 02/21/19, 03/22/19, & 09/17/19 - had covid last month - Discuss booster with Dr Lajuana Ripple  Advanced directives: Advance directive discussed with you today. Even though you declined this today, please call our office should you change your mind, and we can give you the proper paperwork for you to fill out.   Conditions/risks identified: Aim for 30 minutes of exercise or brisk walking each day, drink 6-8 glasses of water and eat lots of fruits and vegetables.   Next appointment: Follow up in one year for your annual wellness visit    Preventive Care 65 Years and Older, Female Preventive care refers to lifestyle choices and visits with your health care provider that can promote health and wellness. What does preventive care include? A yearly physical exam. This is also called an annual well check. Dental exams once or twice a year. Routine eye exams. Ask your health care provider how often you should have your eyes checked. Personal lifestyle choices, including: Daily care of your teeth and gums. Regular physical activity. Eating a healthy diet. Avoiding tobacco  and drug use. Limiting alcohol use. Practicing safe sex. Taking low-dose aspirin every day. Taking vitamin and mineral supplements as recommended by your health care provider. What happens during an annual well check? The services and screenings done by your health care provider during your annual well check will depend on your age, overall health, lifestyle risk factors, and family history of disease. Counseling  Your health care provider may ask you questions about your: Alcohol use. Tobacco use. Drug use. Emotional well-being. Home and relationship well-being. Sexual activity. Eating habits. History of falls. Memory and ability to understand (cognition). Work and work Statistician. Reproductive health. Screening  You may have the following tests or measurements: Height, weight, and BMI. Blood pressure. Lipid and cholesterol levels. These may be checked every 5 years, or more frequently if you are over 54 years old. Skin check. Lung cancer screening. You may have this screening every year starting at age 39 if you have a 30-pack-year history of smoking and currently smoke or have quit within the past 15 years. Fecal occult blood test (FOBT) of the stool. You may have this test every year starting at age 62. Flexible sigmoidoscopy or colonoscopy. You may have a sigmoidoscopy every 5 years or a colonoscopy every 10 years starting at age 25. Hepatitis C blood test. Hepatitis B blood test. Sexually transmitted disease (STD) testing. Diabetes screening. This is done by checking your blood sugar (glucose) after you have not eaten for a while (fasting). You may have this done every 1-3 years. Bone density scan. This is done to screen for osteoporosis. You may have this done starting at age 34. Mammogram. This may be done  every 1-2 years. Talk to your health care provider about how often you should have regular mammograms. Talk with your health care provider about your test results,  treatment options, and if necessary, the need for more tests. Vaccines  Your health care provider may recommend certain vaccines, such as: Influenza vaccine. This is recommended every year. Tetanus, diphtheria, and acellular pertussis (Tdap, Td) vaccine. You may need a Td booster every 10 years. Zoster vaccine. You may need this after age 63. Pneumococcal 13-valent conjugate (PCV13) vaccine. One dose is recommended after age 22. Pneumococcal polysaccharide (PPSV23) vaccine. One dose is recommended after age 34. Talk to your health care provider about which screenings and vaccines you need and how often you need them. This information is not intended to replace advice given to you by your health care provider. Make sure you discuss any questions you have with your health care provider. Document Released: 01/29/2015 Document Revised: 09/22/2015 Document Reviewed: 11/03/2014 Elsevier Interactive Patient Education  2017 [place] Prevention in the Home Falls can cause injuries. They can happen to people of all ages. There are many things you can do to make your home safe and to help prevent falls. What can I do on the outside of my home? Regularly fix the edges of walkways and driveways and fix any cracks. Remove anything that might make you trip as you walk through a door, such as a raised step or threshold. Trim any bushes or trees on the path to your home. Use bright outdoor lighting. Clear any walking paths of anything that might make someone trip, such as rocks or tools. Regularly check to see if handrails are loose or broken. Make sure that both sides of any steps have handrails. Any raised decks and porches should have guardrails on the edges. Have any leaves, snow, or ice cleared regularly. Use sand or salt on walking paths during winter. Clean up any spills in your garage right away. This includes oil or grease spills. What can I do in the bathroom? Use night  lights. Install grab bars by the toilet and in the tub and shower. Do not use towel bars as grab bars. Use non-skid mats or decals in the tub or shower. If you need to sit down in the shower, use a plastic, non-slip stool. Keep the floor dry. Clean up any water that spills on the floor as soon as it happens. Remove soap buildup in the tub or shower regularly. Attach bath mats securely with double-sided non-slip rug tape. Do not have throw rugs and other things on the floor that can make you trip. What can I do in the bedroom? Use night lights. Make sure that you have a light by your bed that is easy to reach. Do not use any sheets or blankets that are too big for your bed. They should not hang down onto the floor. Have a firm chair that has side arms. You can use this for support while you get dressed. Do not have throw rugs and other things on the floor that can make you trip. What can I do in the kitchen? Clean up any spills right away. Avoid walking on wet floors. Keep items that you use a lot in easy-to-reach places. If you need to reach something above you, use a strong step stool that has a grab bar. Keep electrical cords out of the way. Do not use floor polish or wax that makes floors slippery. If you must use wax, use  non-skid floor wax. Do not have throw rugs and other things on the floor that can make you trip. What can I do with my stairs? Do not leave any items on the stairs. Make sure that there are handrails on both sides of the stairs and use them. Fix handrails that are broken or loose. Make sure that handrails are as long as the stairways. Check any carpeting to make sure that it is firmly attached to the stairs. Fix any carpet that is loose or worn. Avoid having throw rugs at the top or bottom of the stairs. If you do have throw rugs, attach them to the floor with carpet tape. Make sure that you have a light switch at the top of the stairs and the bottom of the stairs. If  you do not have them, ask someone to add them for you. What else can I do to help prevent falls? Wear shoes that: Do not have high heels. Have rubber bottoms. Are comfortable and fit you well. Are closed at the toe. Do not wear sandals. If you use a stepladder: Make sure that it is fully opened. Do not climb a closed stepladder. Make sure that both sides of the stepladder are locked into place. Ask someone to hold it for you, if possible. Clearly mark and make sure that you can see: Any grab bars or handrails. First and last steps. Where the edge of each step is. Use tools that help you move around (mobility aids) if they are needed. These include: Canes. Walkers. Scooters. Crutches. Turn on the lights when you go into a dark area. Replace any light bulbs as soon as they burn out. Set up your furniture so you have a clear path. Avoid moving your furniture around. If any of your floors are uneven, fix them. If there are any pets around you, be aware of where they are. Review your medicines with your doctor. Some medicines can make you feel dizzy. This can increase your chance of falling. Ask your doctor what other things that you can do to help prevent falls. This information is not intended to replace advice given to you by your health care provider. Make sure you discuss any questions you have with your health care provider. Document Released: 10/29/2008 Document Revised: 06/10/2015 Document Reviewed: 02/06/2014 Elsevier Interactive Patient Education  2017 [employer].

## 2020-07-27 ENCOUNTER — Other Ambulatory Visit: Payer: Self-pay

## 2020-07-27 ENCOUNTER — Ambulatory Visit (INDEPENDENT_AMBULATORY_CARE_PROVIDER_SITE_OTHER): Payer: Medicare Other | Admitting: Family Medicine

## 2020-07-27 ENCOUNTER — Encounter: Payer: Self-pay | Admitting: Family Medicine

## 2020-07-27 VITALS — BP 114/70 | HR 89 | Temp 98.6°F | Ht 63.0 in | Wt 195.8 lb

## 2020-07-27 DIAGNOSIS — R102 Pelvic and perineal pain: Secondary | ICD-10-CM

## 2020-07-27 DIAGNOSIS — C642 Malignant neoplasm of left kidney, except renal pelvis: Secondary | ICD-10-CM | POA: Diagnosis not present

## 2020-07-27 DIAGNOSIS — I2510 Atherosclerotic heart disease of native coronary artery without angina pectoris: Secondary | ICD-10-CM | POA: Diagnosis not present

## 2020-07-27 NOTE — Progress Notes (Signed)
Subjective: CC: ?RCC PCP: Janora Norlander, DO VQQ:[id number] Leah [name] is a 70 y.o. female presenting to clinic today for:  1.  Renal mass/pelvic floor pain Patient was discovered to have a left-sided renal mass and was subsequently evaluated recently by urology, Dr. Jeffie Pollock with [facility].   Had initially elected surgery but she really did not feel that she had enough information to make a good decision.  She is asking for second opinion and is here today to get this referral.  She does report increased urine output but denies any hematuria.  She continues to have some left-sided discomfort as well as pelvic pain that is intermittent.  She is not sexually active and has not been active in over 7 years.  No reports of abnormal vaginal discharge or bleeding.  She has a surgically absent uterus that was evident on recent pelvic ultrasound but ovaries were not well visualized due to overlying gas in the bowel.  Her MRI obtained in April showed lesion in the left kidney consistent with a renal cell carcinoma but there was no evidence of metastatic disease.  This is consistent with the CT scan that had been obtained a few days prior.  Her bladder at that time was unremarkable without evidence of urinary stones.  Adnexa were also unremarkable in appearance.  ROS: Per HPI  Allergies  Allergen Reactions   Codeine Nausea And Vomiting   Levaquin [Levofloxacin] Other (See Comments)    Achilles tendon pain   Meat [Alpha-Gal]    Penicillins Other (See Comments)    Does not know   Praluent [Alirocumab]     Rash and myalgias   Strawberry (Diagnostic)    Past Medical History:  Diagnosis Date   Allergy    Allergy to alpha-gal    Anxiety    Arthritis    Asthma    CAD (coronary artery disease)    Colon polyp    COVID-19 virus infection 05/2020   Depression    Food allergy    GERD (gastroesophageal reflux disease)    Hyperlipidemia    Hypertension    Renal cell carcinoma (HCC)     Urticaria     Current Outpatient Medications:    albuterol (PROAIR HFA) 108 (90 Base) MCG/ACT inhaler, Inhale 2 puffs into the lungs every 4 (four) hours as needed for wheezing or shortness of breath., Disp: 8.5 g, Rfl: 3   albuterol (PROVENTIL) (2.5 MG/3ML) 0.083% nebulizer solution, Take 3 mLs (2.5 mg total) by nebulization every 6 (six) hours as needed for wheezing or shortness of breath., Disp: 150 mL, Rfl: 6   Albuterol Sulfate 2.5 MG/0.5ML NEBU, Take 0.5 mLs (2.5 mg total) by nebulization every 6 (six) hours as needed., Disp: 20 mL, Rfl: 3   aspirin 81 MG tablet, Take 81 mg by mouth daily., Disp: , Rfl:    calcium carbonate (TUMS) 500 MG chewable tablet, Chew 1 tablet (200 mg of elemental calcium total) by mouth as needed for indigestion or heartburn., Disp: , Rfl:    Cholecalciferol (VITAMIN D) 2000 UNITS tablet, Take 2,000 Units by mouth daily., Disp: , Rfl:    cyclobenzaprine (FLEXERIL) 5 MG tablet, Take 1 tablet (5 mg total) by mouth 3 (three) times daily as needed for muscle spasms., Disp: 30 tablet, Rfl: 1   famotidine (PEPCID) 40 MG tablet, Take 1 tablet (40 mg total) by mouth daily. (Patient taking differently: Take 40 mg by mouth as needed.), Disp: 90 tablet, Rfl: 3   fish oil-omega-3  fatty acids 1000 MG capsule, Take 2 g by mouth daily. , Disp: , Rfl:    fluticasone (FLONASE) 50 MCG/ACT nasal spray, Place 2 sprays into both nostrils daily., Disp: 16 g, Rfl: 6   ibuprofen (ADVIL) 800 MG tablet, Take 1 tablet (800 mg total) by mouth at bedtime as needed for moderate pain or cramping., Disp: 30 tablet, Rfl: 0   loratadine (CLARITIN) 10 MG tablet, Take 10 mg by mouth 2 (two) times daily., Disp: , Rfl:    losartan-hydrochlorothiazide (HYZAAR) 100-12.5 MG tablet, TAKE ONE TABLET BY MOUTH DAILY, Disp: 90 tablet, Rfl: 1   Multiple Vitamins-Minerals (EMERGEN-C IMMUNE PO), Take by mouth., Disp: , Rfl:    rosuvastatin (CRESTOR) 20 MG tablet, Take 1 tablet (20 mg total) by mouth daily., Disp:  90 tablet, Rfl: 3   Simethicone (GAS-X PO), Take 2 tablets by mouth daily., Disp: , Rfl:    EPINEPHrine 0.3 mg/0.3 mL IJ SOAJ injection, Inject into the muscle once. (Patient not taking: No sig reported), Disp: , Rfl:    naproxen (NAPROSYN) 500 MG tablet, Take 1 tablet (500 mg total) by mouth 2 (two) times daily with a meal. (Patient not taking: No sig reported), Disp: 30 tablet, Rfl: 0   pantoprazole (PROTONIX) 40 MG tablet, Take 1 tablet (40 mg total) by mouth daily. For stomach (Patient not taking: Reported on 07/27/2020), Disp: 30 tablet, Rfl: 11 Social History   Socioeconomic History   Marital status: Divorced    Spouse name: Not on file   Number of children: 3   Years of education: 12+   Highest education level: Some college, no degree  Occupational History   Occupation: private duty Manufacturing systems engineer  Tobacco Use   Smoking status: Former    Packs/day: 0.50    Years: 28.00    Pack years: 14.00    Types: Cigarettes    Quit date: 01/17/1996    Years since quitting: 24.5   Smokeless tobacco: Never  Vaping Use   Vaping Use: Never used  Substance and Sexual Activity   Alcohol use: Yes    Alcohol/week: 2.0 standard drinks    Types: 2 Glasses of wine per week    Comment: occasional use of wine    Drug use: No   Sexual activity: Not Currently    Partners: Male    Birth control/protection: Surgical  Other Topics Concern   Not on file  Social History Narrative   Lives alone.    Social Determinants of Health   Financial Resource Strain: Low Risk    Difficulty of Paying Living Expenses: Not hard at all  Food Insecurity: No Food Insecurity   Worried About Charity fundraiser in the Last Year: Never true   [place] in the Last Year: Never true  Transportation Needs: No Transportation Needs   Lack of Transportation (Medical): No   Lack of Transportation (Non-Medical): No  Physical Activity: Insufficiently Active   Days of Exercise per Week: 7 days   Minutes of Exercise per  Session: 20 min  Stress: No Stress Concern Present   Feeling of Stress : Not at all  Social Connections: Moderately Integrated   Frequency of Communication with Friends and Family: More than three times a week   Frequency of Social Gatherings with Friends and Family: More than three times a week   Attends Religious Services: More than 4 times per year   Active Member of [employer] or Organizations: Yes   Attends Archivist  Meetings: More than 4 times per year   Marital Status: Divorced  Human resources officer Violence: Not At Risk   Fear of Current or Ex-Partner: No   Emotionally Abused: No   Physically Abused: No   Sexually Abused: No   Family History  Problem Relation Age of Onset   Heart disease Mother    Alzheimer's disease Mother    Diabetes Mother    Other Father        deceased age 85 from some sort of GI problems but no malignancy   Diabetes Sister    Leukemia Brother        CLL   Asthma Sister    Colon polyps Sister    Diabetes Brother    Cancer Cousin        pancreatic   Myasthenia gravis Daughter    Diabetes Daughter    Cancer Cousin        pancreatic   Colon cancer Neg Hx    Stomach cancer Neg Hx    Esophageal cancer Neg Hx    Rectal cancer Neg Hx     Objective: Office vital signs reviewed. BP 114/70   Pulse 89   Temp 98.6 F (37 C)   Ht _0  (1.6 m)   Wt 195 lb 12.8 oz (88.8 kg)   SpO2 98%   BMI 34.68 kg/m   Physical Examination:  General: Awake, alert, well nourished, well appearing. No acute distress    Assessment/ Plan: 70 y.o. female   Renal cell carcinoma of left kidney ([place]) - Plan: CMP14+EGFR, CBC, Ambulatory referral to Urology  Coronary artery disease involving native coronary artery of native heart without angina pectoris - Plan: CMP14+EGFR, CBC  Pelvic pain - Plan: Ambulatory referral to Physical Therapy  Second opinion has been placed.  May benefit from urogynecology at some point to given pelvic pain of uncertain etiology.   Referral to pelvic floor rehabilitation in [place] placed.  I agree that surgical resection is unlikely in her future and have encouraged her to go ahead and schedule a visit with her cardiologist for cardiac clearance so as not to delay care.  I read Dr. Ralene Muskrat recommendations and agree that if this is in fact a renal cell carcinoma it does need to be resected.  Her surgically most certainly will be complicated by previous intra-abdominal surgeries including mesh placement for hernia repair.  I think she simply needs more information and a clear plan/ expectations postop before she is willing to proceed with any interventions.  Orders Placed This Encounter  Procedures   CMP14+EGFR   CBC   Ambulatory referral to Urology    Referral Priority:   Urgent    Referral Type:   Consultation    Referral Reason:   Second Opinion    Requested Specialty:   Urology    Number of Visits Requested:   1   Ambulatory referral to Physical Therapy    Referral Priority:   Routine    Referral Type:   Physical Medicine    Referral Reason:   Specialty Services Required    Requested Specialty:   Physical Therapy    Number of Visits Requested:   1   No orders of the defined types were placed in this encounter.   Total time spent with patient 31 minutes.  Greater than 50% of encounter spent in coordination of care/counseling.  Janora Norlander, DO [place] [phone]

## 2020-07-28 ENCOUNTER — Telehealth: Payer: Self-pay

## 2020-07-28 NOTE — Telephone Encounter (Signed)
Patient called office to cancel surgery posted for 07/28. Pt would like to have a second opinion before proceeding with surgery.  Cancelled surgery with Colletta [place] at scheduling.

## 2020-07-29 ENCOUNTER — Encounter: Payer: Self-pay | Admitting: Physical Therapy

## 2020-07-29 ENCOUNTER — Other Ambulatory Visit: Payer: Self-pay

## 2020-07-29 ENCOUNTER — Telehealth: Payer: Self-pay | Admitting: Family Medicine

## 2020-07-29 ENCOUNTER — Ambulatory Visit: Payer: Medicare Other | Attending: Family Medicine | Admitting: Physical Therapy

## 2020-07-29 DIAGNOSIS — M79609 Pain in unspecified limb: Secondary | ICD-10-CM | POA: Insufficient documentation

## 2020-07-29 NOTE — Telephone Encounter (Signed)
Spoke with patient she is aware you are off today. She got the referral for PT for the pelvic pain, but she would like the referral to a GYN because she would like to know why she is having the pain. Please advise.

## 2020-07-29 NOTE — Therapy (Addendum)
[facility] [address] [place], [place], [address] Phone: [phone]   Fax:  [phone]  Physical Therapy Evaluation  Patient Details  Name: Leah [name] MRN: [id number] Date of Birth: [birth year] No data recorded  Encounter Date: 07/29/2020   PT End of Session - 07/29/20 1532     Visit Number 1    Authorization Type --    Authorization - Visit Number --    PT Start Time --    PT Stop Time --    PT Time Calculation (min) --             Past Medical History:  Diagnosis Date   Allergy    Allergy to alpha-gal    Anxiety    Arthritis    Asthma    CAD (coronary artery disease)    Colon polyp    COVID-19 virus infection 05/2020   Depression    Food allergy    GERD (gastroesophageal reflux disease)    Hyperlipidemia    Hypertension    Renal cell carcinoma ([place])    Urticaria     Past Surgical History:  Procedure Laterality Date   ABDOMINAL HYSTERECTOMY     Carpel Tunnel     CHOLECYSTECTOMY     COLONOSCOPY  02/08/2005   OAC:[id number] hemorrhoids otherwise normal rectum/ A few scattered, shallow, left-sided sigmoid diverticula/The remainder of the colonic mucosa appeared normal   COLONOSCOPY WITH ESOPHAGOGASTRODUODENOSCOPY (EGD) N/A 11/29/2012   Procedure: COLONOSCOPY WITH ESOPHAGOGASTRODUODENOSCOPY (EGD);  Surgeon: Daneil Dolin, MD;  Location: [facility];  Service: Endoscopy;  Laterality: N/A;  10:45   CYST REMOVAL TRUNK     HERNIA REPAIR     ventral X 2 with mesh, last one 2011 ([place]). complicated with 2 week hospital stay   [place]     with mesh   NISSEN FUNDOPLICATION  [id number]   POLYPECTOMY     ROTATOR CUFF REPAIR Left 04/04/2019   TUBAL LIGATION      There were no vitals filed for this visit.    Subjective Assessment - 07/29/20 1530     Subjective Pt reports she has been having pelvic pain which limits ability to walk due to this pain. It is not constant and reports  some days without pain. Pt reports low back pain on bil sides as well as frequent urination every 35-40 mins with strong urge. After discussion with patient about what pelvic floor evaluation would consist of this date, pt reported she thought she was going to see a gynecologist and once clear on today's appointment she did not want to proceed with pelvic floor evaluation. Pt was educated on pelvic floor PT services and pt verbalized understanding and thankful for infomation but politely declined additional services. Pt educated to contact referring DO Gottschalk and to return or [place] back if needed in the future. Pt agreed.    Limitations --                            Objective measurements completed on examination: See above findings.     Pt denied evaluation this date. Educated on pelvic floor and evaluation but declined services prior to starting evaluation.                        Plan - 07/29/20 1533     Clinical Impression Statement --  Patient will benefit from skilled therapeutic intervention in order to improve the following deficits and impairments:     Visit Diagnosis: Pain in extremity, unspecified extremity     Problem List Patient Active Problem List   Diagnosis Date Noted   Bloating 07/02/2020   Flatulence 07/02/2020   Generalized abdominal pain 07/02/2020   Pelvic pain 06/15/2020   Cough 06/07/2020   CAD (coronary artery disease)    Hypertension    Tear of medial meniscus of knee 05/20/2020   COVID-19 05/2020   Hip pain 10/28/2019   Acute pain of right knee 10/28/2019   Right lower quadrant abdominal pain 10/28/2019   Internal hemorrhoids 04/11/2019   Constipation 04/11/2019   Rectal bleeding 07/13/2017   Arthropathy of left shoulder 07/13/2017   Hypokalemia 02/20/2017   Chest pain 01/17/2017   Adjustment disorder with mixed anxiety and depressed mood 03/25/2013   Epigastric pain 11/12/2012   Nausea  alone 11/12/2012   History of colonic polyps 11/12/2012   Hypertension, benign essential, goal below 140/90 10/30/2012   Hyperlipidemia 10/22/2012   GERD (gastroesophageal reflux disease) 10/22/2012   Asthma 10/22/2012    Stacy Gardner, PT 07/30/2208:53 AM   [facility] [address], [place], [place], [address] Phone: [phone]   Fax:  [phone]  Name: Leah [name] MRN: [id number] Date of Birth: February 13, 1950

## 2020-07-30 NOTE — Telephone Encounter (Signed)
As I discussed with her during the OV, referral to PT for pelvic pain. Referral to urology for second opinion.  The urologists with [facility] have a URO/GYN there and will refer her separately one her other issue is take care of.

## 2020-08-02 NOTE — Telephone Encounter (Signed)
Pt rc--checking in on referral change

## 2020-08-02 NOTE — Telephone Encounter (Signed)
LMTCB

## 2020-08-03 ENCOUNTER — Encounter (HOSPITAL_COMMUNITY): Payer: Medicare Other

## 2020-08-12 ENCOUNTER — Telehealth: Payer: Self-pay | Admitting: Cardiovascular Disease

## 2020-08-12 ENCOUNTER — Inpatient Hospital Stay: Admit: 2020-08-12 | Payer: Medicare Other | Admitting: Urology

## 2020-08-12 SURGERY — NEPHRECTOMY, PARTIAL, ROBOT-ASSISTED
Anesthesia: General | Laterality: Left

## 2020-08-12 NOTE — Telephone Encounter (Signed)
Left a message to call back. This is a Dr. Angelena Form patient.

## 2020-08-12 NOTE — Telephone Encounter (Signed)
Patient returning call.

## 2020-08-12 NOTE — Telephone Encounter (Signed)
Spoke with the patient. She needs an appointment with Dr. Angelena Form for pre op clearance.

## 2020-08-12 NOTE — Telephone Encounter (Signed)
New Message:     Patient says she needs to have emergency surgery for Cancer. She wants to know if there any way that Dr C can work her in to see him for her surgical clearance. She said she only wants to see Dr C.

## 2020-08-17 DIAGNOSIS — N2889 Other specified disorders of kidney and ureter: Secondary | ICD-10-CM | POA: Insufficient documentation

## 2020-08-17 DIAGNOSIS — Z8719 Personal history of other diseases of the digestive system: Secondary | ICD-10-CM | POA: Diagnosis not present

## 2020-08-17 DIAGNOSIS — Z9889 Other specified postprocedural states: Secondary | ICD-10-CM | POA: Diagnosis not present

## 2020-08-18 ENCOUNTER — Telehealth: Payer: Self-pay | Admitting: Cardiovascular Disease

## 2020-08-18 NOTE — Telephone Encounter (Signed)
   Pt c/o of Chest Pain: STAT if CP now or developed within 24 hours  1. Are you having CP right now? no  2. Are you experiencing any other symptoms (ex. SOB, nausea, vomiting, sweating)? sob  3. How long have you been experiencing CP? Last week  4. Is your CP continuous or coming and going? Coming and going  5. Have you taken Nitroglycerin? No   Pt said she's been experiencing pain on her left upper side of her chest, she said she doesn't feel it often but when she does feel it it takes her breath away, she wanted to get an appt with [facility] if possible sooner than January ?

## 2020-08-18 NOTE — Telephone Encounter (Signed)
Message from Dr. Angelena Form: Continue with plan for f/u in 9 days. She should go to the ED with worsened symptoms. Chris  Pt has been informed and is in agreement with this plan.

## 2020-08-18 NOTE — Telephone Encounter (Signed)
Pt reports CP that just hits me, comes and goes, last about 5 min then gone. Occurs maybe once a week/every other week. Takes her breath away when occurs, extreme pain. Pain felt in shoulders and down left arm. Feels like she is going to pass out. Issue has occurred since Covid exposure in May, states it started around then. Will forward to RN to review w/ MD.

## 2020-08-25 ENCOUNTER — Ambulatory Visit: Payer: Medicare Other | Admitting: Urology

## 2020-08-25 DIAGNOSIS — C642 Malignant neoplasm of left kidney, except renal pelvis: Secondary | ICD-10-CM

## 2020-08-26 NOTE — Progress Notes (Signed)
Cardiology Office Note    Date:  08/27/2020   ID:  XEE GOARD, DOB [birth year], MRN [id number]  PCP:  Janora Norlander, DO  Cardiologist:  Lauree Chandler, MD  Electrophysiologist:  None   Chief Complaint: chest pain  History of Present Illness:   Leah [name] is a 70 y.o. female with history of HTN, HLD, nonobstructive CAD by coronary CT 2019, alpha gal allergy, depression, arthritis, anxiety, prior statin intolerance who presents for evaluation of chest pain. She was previously admitted to [facility] in 2019 with chest pain with coronary CTA showing mild nonobstructive CAD as below, no significant extracardiac findings She has been statin intolerant in the past with Lipitor as well as Zetia. She also did not tolerate Praluent. She has been on rosuvastatin and feeling better on this.  Earlier this year by MR 04/2020 she was found to have a kidney mass, pending urology workup. Per urology note 08/17/20, "Patient has been diagnosed with incidental left renal masses. Patient is not having any bothersome urinary symptoms. Denies hematuria. Considering the small size of the renal mass (less than 3 cm), we discussed various options including active surveillance, ablative therapies and partial nephrectomy but the patient. Patient would like to continue with monitoring of the renal masses at this time." The patient reports this will be followed back up in February at which time she'll consider surgical evaluation.  She is seen back for evaluation of chest pain. Looking back this has been present for about a year but recently over the last few months has increased in frequency of symptoms. She describes it happening once every 2 weeks in the left upper chest, very sudden, so intense that it stuns her and she feels like she may freeze or pass out. It lasts about 5 minutes. Symptoms occur totally at random and not specifically with exertion. It got worse after she had Covid in May. She has not had  any exertional angina. No dyspnea, orthopnea, PND or lower extremity edema. She is inquiring about testing for PAD today. She reports occasional pain in her legs at night when she rests. This seems concentrated towards her feet. This has also been present for several months. No exertional claudication. She has no lower extremity edema, tenderness, or erythema. She is not tachycardic, tachypneic or hypoxic. She feels well in clinic today.   Labwork independently reviewed: 02/2020 CMET ok ex glu 107; K 4.1, WBC 3.2, Hgb 13 12/2019 LDL 132, Mg 2.0 03/2019 TSH wnl   Past Medical History:  Diagnosis Date   Allergy    Allergy to alpha-gal    Anxiety    Arthritis    Asthma    CAD (coronary artery disease)    Colon polyp    COVID-19 virus infection 05/2020   Depression    Food allergy    GERD (gastroesophageal reflux disease)    Hyperlipidemia    Hypertension    Renal cell carcinoma (HCC)    Urticaria     Past Surgical History:  Procedure Laterality Date   ABDOMINAL HYSTERECTOMY     Carpel Tunnel     CHOLECYSTECTOMY     COLONOSCOPY  02/08/2005   [id number] hemorrhoids otherwise normal rectum/ A few scattered, shallow, left-sided sigmoid diverticula/The remainder of the colonic mucosa appeared normal   COLONOSCOPY WITH ESOPHAGOGASTRODUODENOSCOPY (EGD) N/A 11/29/2012   Procedure: COLONOSCOPY WITH ESOPHAGOGASTRODUODENOSCOPY (EGD);  Surgeon: Daneil Dolin, MD;  Location: [facility];  Service: Endoscopy;  Laterality: N/A;  10:45  CYST REMOVAL TRUNK     HERNIA REPAIR     ventral X 2 with mesh, last one 2011 ([place]). complicated with 2 week hospital stay   HERNIA REPAIR     with mesh   NISSEN FUNDOPLICATION  0000000   POLYPECTOMY     ROTATOR CUFF REPAIR Left 04/04/2019   TUBAL LIGATION      Current Medications: Current Meds  Medication Sig   albuterol (PROAIR HFA) 108 (90 Base) MCG/ACT inhaler Inhale 2 puffs into the lungs every 4 (four) hours as needed for wheezing or  shortness of breath.   albuterol (PROVENTIL) (2.5 MG/3ML) 0.083% nebulizer solution Take 3 mLs (2.5 mg total) by nebulization every 6 (six) hours as needed for wheezing or shortness of breath.   Albuterol Sulfate 2.5 MG/0.5ML NEBU Take 0.5 mLs (2.5 mg total) by nebulization every 6 (six) hours as needed.   aspirin 81 MG tablet Take 81 mg by mouth daily.   calcium carbonate (TUMS) 500 MG chewable tablet Chew 1 tablet (200 mg of elemental calcium total) by mouth as needed for indigestion or heartburn.   Cholecalciferol (VITAMIN D) 2000 UNITS tablet Take 2,000 Units by mouth daily.   cyclobenzaprine (FLEXERIL) 5 MG tablet Take 1 tablet (5 mg total) by mouth 3 (three) times daily as needed for muscle spasms.   EPINEPHrine 0.3 mg/0.3 mL IJ SOAJ injection Inject into the muscle once.   famotidine (PEPCID) 40 MG tablet Take 1 tablet (40 mg total) by mouth daily. (Patient taking differently: Take 40 mg by mouth as needed.)   fish oil-omega-3 fatty acids 1000 MG capsule Take 2 g by mouth daily.    fluticasone (FLONASE) 50 MCG/ACT nasal spray Place 2 sprays into both nostrils daily.   loratadine (CLARITIN) 10 MG tablet Take 10 mg by mouth 2 (two) times daily.   losartan-hydrochlorothiazide (HYZAAR) 100-12.5 MG tablet TAKE ONE TABLET BY MOUTH DAILY   Multiple Vitamins-Minerals (EMERGEN-C IMMUNE PO) Take by mouth.   pantoprazole (PROTONIX) 40 MG tablet Take 1 tablet (40 mg total) by mouth daily. For stomach   rosuvastatin (CRESTOR) 20 MG tablet Take 1 tablet (20 mg total) by mouth daily.      Allergies:   Codeine, Levaquin [levofloxacin], Meat [alpha-gal], Penicillins, Praluent [alirocumab], and Strawberry (diagnostic)   Social History   Socioeconomic History   Marital status: Divorced    Spouse name: Not on file   Number of children: 3   Years of education: 12+   Highest education level: Some college, no degree  Occupational History   Occupation: private duty Manufacturing systems engineer  Tobacco Use    Smoking status: Former    Packs/day: 0.50    Years: 28.00    Pack years: 14.00    Types: Cigarettes    Quit date: 01/17/1996    Years since quitting: 24.6   Smokeless tobacco: Never  Vaping Use   Vaping Use: Never used  Substance and Sexual Activity   Alcohol use: Yes    Alcohol/week: 2.0 standard drinks    Types: 2 Glasses of wine per week    Comment: occasional use of wine    Drug use: No   Sexual activity: Not Currently    Partners: Male    Birth control/protection: Surgical  Other Topics Concern   Not on file  Social History Narrative   Lives alone.    Social Determinants of Health   Financial Resource Strain: Low Risk    Difficulty of Paying Living Expenses: Not hard at all  Food Insecurity: No Food Insecurity   Worried About Charity fundraiser in the Last Year: Never true   Ran Out of Food in the Last Year: Never true  Transportation Needs: No Transportation Needs   Lack of Transportation (Medical): No   Lack of Transportation (Non-Medical): No  Physical Activity: Insufficiently Active   Days of Exercise per Week: 7 days   Minutes of Exercise per Session: 20 min  Stress: No Stress Concern Present   Feeling of Stress : Not at all  Social Connections: Moderately Integrated   Frequency of Communication with Friends and Family: More than three times a week   Frequency of Social Gatherings with Friends and Family: More than three times a week   Attends Religious Services: More than 4 times per year   Active Member of [employer] or Organizations: Yes   Attends Music therapist: More than 4 times per year   Marital Status: Divorced     Family History:  The patient's family history includes Alzheimer's disease in her mother; Asthma in her sister; Cancer in her cousin and cousin; Colon polyps in her sister; Diabetes in her brother, daughter, mother, and sister; Heart disease in her mother; Leukemia in her brother; Myasthenia gravis in her daughter; Other in her  father. There is no history of Colon cancer, Stomach cancer, Esophageal cancer, or Rectal cancer.  ROS:   Please see the history of present illness. ] All other systems are reviewed and otherwise negative.    EKGs/Labs/Other Studies Reviewed:    Studies reviewed are outlined and summarized above. Reports included below if pertinent.  2019 Cor CT Cardiac CTA   MEDICATIONS: Sub lingual nitro .'4mg'$  and lopressor '5mg'$  I   TECHNIQUE: The patient was scanned on a Siemens [id number] slice scanner. Gantry rotation speed was 240 msecs. Collimation was 0.6 mm. A 100 kV prospective scan was triggered in the ascending thoracic aorta at 35-75% of the R-R interval. Average HR during the scan was 60 bpm. The 3D data set was interpreted on a dedicated work station using MPR, MIP and VRT modes. A total of 80cc of contrast was used.   FINDINGS: Non-cardiac: See separate report from [facility].   Calcium Score:  83.5 Agatston units.   Coronary Arteries: Right dominant with no anomalies   LM:  Calcified plaque at the ostium without significant stenosis.   LAD system:  Mixed plaque with mild stenosis at the ostium.   Circumflex system: Large ramus, mixed plaque with mild stenosis proximally. No plaque or stenosis in the AV LCx.   RCA system:  No plaque or stenosis.   IMPRESSION: 1. Coronary artery calcium score 83.5 Agatston units. This placed the patient in the 79th percentile for age and gender, suggesting intermediate to high risk for future cardiac events.   2.  Nonobstructive mild coronary disease.   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 On: 01/18/2017 16:58    EKG:  EKG is ordered today, personally reviewed, demonstrating NSR 80bpm, no acute STT changes   Recent Labs: 12/31/2019: Magnesium 2.0 03/10/2020: ALT 30; BUN 12; Potassium 4.1; Sodium 143 04/05/2020: Hemoglobin 12.7; Platelets 294 04/15/2020: Creatinine, Ser 1.00  Recent Lipid Panel     Component Value Date/Time   CHOL 162 04/05/2020 1011   TRIG 78 04/05/2020 1011   TRIG 79 02/17/2016 0817   HDL 54 04/05/2020 1011   HDL 53 02/17/2016 0817   CHOLHDL 3.0 04/05/2020 1011   CHOLHDL 3.5 06/11/2007  0450   VLDL 20 06/11/2007 0450   LDLCALC 93 04/05/2020 1011   LDLCALC 171 (H) 08/25/2013 0810    PHYSICAL EXAM:    VS:  BP 110/86   Pulse 80   Ht '5\' 4"'$  (1.626 m)   Wt 196 lb 3.2 oz (89 kg)   SpO2 97%   BMI 33.68 kg/m   BMI: Body mass index is 33.68 kg/m.  GEN: Well nourished, well developed female in no acute distress HEENT: normocephalic, atraumatic Neck: no JVD, carotid bruits, or masses Cardiac: RRR; no murmurs, rubs, or gallops, no edema, 1+ pedal pulses bilaterally Respiratory:  clear to auscultation bilaterally, normal work of breathing GI: soft, nontender, nondistended, + BS MS: no deformity or atrophy Skin: warm and dry, no rash Neuro:  Alert and Oriented x 3, Strength and sensation are intact, follows commands Psych: euthymic mood, full affect  Wt Readings from Last 3 Encounters:  08/27/20 196 lb 3.2 oz (89 kg)  07/27/20 195 lb 12.8 oz (88.8 kg)  07/26/20 192 lb (87.1 kg)     ASSESSMENT & PLAN:   1. Chest pain - symptoms are somewhat atypical in nature. EKG reassuring. She may require kidney surgery in the next year. We will update her coronary CTA for further evaluation along with echocardiogram given her sensation of near-syncope with the discomfort. We will have her hold her losartan/HCTZ the day of the study so that we can dose the necessary metoprolol prior to CT study. PE was considered in the differential but this seems unlikely - the prolonged chronicity does not fit. She is not tachycardic, tachypneic or hypoxic. She has no signs of VTE on exam. If cardiac testing is negative, query whether this represents a neuropathic process. Cannot exclude coronary vasospasm as well. She is already on a PPI plus H2 blocker. We will check basic labs including  CBC, CMET, TSH today.  2. Pre-operative cardiovascular evaluation - clearance has not yet been formally requested as patient has elected clinical surveillance for now, but this can be revisited in the future if she decides to pursue kidney surgery.  3. Mild CAD with HLD goal LDL <70 - evaluation planned as above. Continue ASA, statin. Check lipid profile and CMET today (had banana only).  4. Essential HTN - controlled on present regimen. No changes made today. Updating labs as above.  5. Bilateral leg pain (mostly cramping at night in her feet) - somewhat atypical although pedal pulses are slightly diminished. Patient inquires about testing for vascular disease. We'll obtain noninvasive PV studies. No signs of VTE on exam. Check TSH, electrolytes and CK. If this symptom persists, consideration could be given to statin holiday to see if this resolves symptoms, but given workup for chest pain we will hold tight for now unless labs dictate otherwise.   Disposition: F/u with me in 6-8 weeks.  Medication Adjustments/Labs and Tests Ordered: Current medicines are reviewed at length with the patient today.  Concerns regarding medicines are outlined above. Medication changes, Labs and Tests ordered today are summarized above and listed in the Patient Instructions accessible in Encounters.   Signed, Leah Pitter, PA-C  08/27/2020 10:31 AM    [address], [place], [place]  [address] Phone: [phone]; Fax: [phone]

## 2020-08-27 ENCOUNTER — Other Ambulatory Visit (HOSPITAL_COMMUNITY): Payer: Self-pay | Admitting: Radiology

## 2020-08-27 ENCOUNTER — Encounter: Payer: Self-pay | Admitting: Physician Assistant

## 2020-08-27 ENCOUNTER — Other Ambulatory Visit: Payer: Self-pay

## 2020-08-27 ENCOUNTER — Ambulatory Visit (INDEPENDENT_AMBULATORY_CARE_PROVIDER_SITE_OTHER): Payer: Medicare Other | Admitting: Physician Assistant

## 2020-08-27 VITALS — BP 110/86 | HR 80 | Ht 64.0 in | Wt 196.2 lb

## 2020-08-27 DIAGNOSIS — R072 Precordial pain: Secondary | ICD-10-CM

## 2020-08-27 DIAGNOSIS — M79604 Pain in right leg: Secondary | ICD-10-CM

## 2020-08-27 DIAGNOSIS — E785 Hyperlipidemia, unspecified: Secondary | ICD-10-CM

## 2020-08-27 DIAGNOSIS — I10 Essential (primary) hypertension: Secondary | ICD-10-CM

## 2020-08-27 DIAGNOSIS — Z01810 Encounter for preprocedural cardiovascular examination: Secondary | ICD-10-CM | POA: Diagnosis not present

## 2020-08-27 DIAGNOSIS — M79605 Pain in left leg: Secondary | ICD-10-CM | POA: Diagnosis not present

## 2020-08-27 DIAGNOSIS — R079 Chest pain, unspecified: Secondary | ICD-10-CM

## 2020-08-27 DIAGNOSIS — I2510 Atherosclerotic heart disease of native coronary artery without angina pectoris: Secondary | ICD-10-CM | POA: Diagnosis not present

## 2020-08-27 LAB — COMPREHENSIVE METABOLIC PANEL
ALT: 27 IU/L (ref 0–32)
AST: 25 IU/L (ref 0–40)
Albumin/Globulin Ratio: 2 (ref 1.2–2.2)
Albumin: 4.2 g/dL (ref 3.8–4.8)
Alkaline Phosphatase: 97 IU/L (ref 44–121)
BUN/Creatinine Ratio: 14 (ref 12–28)
BUN: 13 mg/dL (ref 8–27)
Bilirubin Total: 0.4 mg/dL (ref 0.0–1.2)
CO2: 26 mmol/L (ref 20–29)
Calcium: 9.2 mg/dL (ref 8.7–10.3)
Chloride: 101 mmol/L (ref 96–106)
Creatinine, Ser: 0.94 mg/dL (ref 0.57–1.00)
Globulin, Total: 2.1 g/dL (ref 1.5–4.5)
Glucose: 100 mg/dL — ABNORMAL HIGH (ref 65–99)
Potassium: 4.1 mmol/L (ref 3.5–5.2)
Sodium: 140 mmol/L (ref 134–144)
Total Protein: 6.3 g/dL (ref 6.0–8.5)
eGFR: 66 mL/min/{1.73_m2} (ref >59)

## 2020-08-27 LAB — CBC
Hematocrit: 37.9 % (ref 34.0–46.6)
Hemoglobin: 12.9 g/dL (ref 11.1–15.9)
MCH: 27.6 pg (ref 26.6–33.0)
MCHC: 34 g/dL (ref 31.5–35.7)
MCV: 81 fL (ref 79–97)
Platelets: 269 10*3/uL (ref 150–450)
RBC: 4.67 x10E6/uL (ref 3.77–5.28)
RDW: 12.9 % (ref 11.7–15.4)
WBC: 3.2 10*3/uL — ABNORMAL LOW (ref 3.4–10.8)

## 2020-08-27 LAB — LIPID PANEL
Chol/HDL Ratio: 3.2 ratio (ref 0.0–4.4)
Cholesterol, Total: 168 mg/dL (ref 100–199)
HDL: 53 mg/dL (ref >39)
LDL Chol Calc (NIH): 96 mg/dL (ref 0–99)
Triglycerides: 107 mg/dL (ref 0–149)
VLDL Cholesterol Cal: 19 mg/dL (ref 5–40)

## 2020-08-27 LAB — CK: Total CK: 276 U/L — ABNORMAL HIGH (ref 32–182)

## 2020-08-27 LAB — MAGNESIUM: Magnesium: 2 mg/dL (ref 1.6–2.3)

## 2020-08-27 LAB — TSH: TSH: 1.29 u[IU]/mL (ref 0.450–4.500)

## 2020-08-27 MED ORDER — METOPROLOL TARTRATE 100 MG PO TABS: 100.0000 mg | ORAL_TABLET | ORAL | 0 refills | Status: DC

## 2020-08-27 NOTE — Progress Notes (Signed)
Full lab panel pending at this time but please let pt know her CK level is persistently elevated - this is a muscle breakdown product. Would stop rosuvastatin for now and recheck CK level in 10-14 days. In the meantime would suggest she follow-up with primary care to discuss further work-up - may need to see rheumatology as it looks like this was elevated in 2018 even when she was not on statins per med rec, so I am not sure this has anything to do with her statin. May need to see lipid clinic in follow-up but needs to have this evaluated in the meantime.

## 2020-08-27 NOTE — Patient Instructions (Addendum)
Medication Instructions:  Your physician recommends that you continue on your current medications as directed. Please refer to the Current Medication list given to you today.  *If you need a refill on your cardiac medications before your next appointment, please call your pharmacy*   Lab Work: Cmp, Mag, Cbc, Tsh, Lipids, Ck- Today   If you have labs (blood work) drawn today and your tests are completely normal, you will receive your results only by: [place] (if you have MyChart) OR A paper copy in the mail If you have any lab test that is abnormal or we need to change your treatment, we will call you to review the results.   Testing/Procedures: Your physician has requested that you have an echocardiogram. Echocardiography is a painless test that uses sound waves to create images of your heart. It provides your doctor with information about the size and shape of your heart and how well your heart's chambers and valves are working. This procedure takes approximately one hour. There are no restrictions for this procedure.  Your physician has requested that you have a lower extremity arterial exercise duplex. During this test, exercise and ultrasound are used to evaluate arterial blood flow in the legs. Allow one hour for this exam. There are no restrictions or special instructions.  Your physician has requested that you have a Coronary CTA *Instructions below*   Follow-Up: At [facility], you and your health needs are our priority.  As part of our continuing mission to provide you with exceptional heart care, we have created designated Provider Care Teams.  These Care Teams include your primary Cardiologist (physician) and Advanced Practice Providers (APPs -  Physician Assistants and Nurse Practitioners) who all work together to provide you with the care you need, when you need it.  We recommend signing up for the patient portal called "MyChart".  Sign up information is provided on  this After Visit Summary.  MyChart is used to connect with patients for Virtual Visits (Telemedicine).  Patients are able to view lab/test results, encounter notes, upcoming appointments, etc.  Non-urgent messages can be sent to your provider as well.   To learn more about what you can do with MyChart, go to [email or web address].    Your next appointment:   6 -8week(s)  The format for your next appointment:   In Person  Provider:   You may see Lauree Chandler, MD or one of the following Advanced Practice Providers on your designated Care Team:   Melina Copa, PA-C Ermalinda Barrios, PA-C   Other Instructions   Your cardiac CT will be scheduled at one of the below locations:   [facility] [address] [place], [place] [address] [phone]  If scheduled at [facility], please arrive at the [facility] main entrance (entrance A) of [facility] 30 minutes prior to test start time. Proceed to the [facility] Radiology Department (first floor) to check-in and test prep.   On the Night Before the Test: Be sure to Drink plenty of water. Do not consume any caffeinated/decaffeinated beverages or chocolate 12 hours prior to your test. Do not take any antihistamines 12 hours prior to your test.   On the Day of the Test: Drink plenty of water until 1 hour prior to the test. Do not eat any food 4 hours prior to the test. You may take your regular medications prior to the test.  Hold your Losartan- HCTZ the morning of your test  Take metoprolol (  Lopressor) two hours prior to test. FEMALES- please wear underwire-free bra if available, avoid dresses & tight clothing       After the Test: Drink plenty of water. After receiving IV contrast, you may experience a mild flushed feeling. This is normal. On occasion, you may experience a mild rash up to 24 hours after the test. This is not dangerous. If this occurs, you can take Benadryl 25 mg and increase  your fluid intake. If you experience trouble breathing, this can be serious. If it is severe call 911 IMMEDIATELY. If it is mild, please call our office. If you take any of these medications: Glipizide/Metformin, Avandament, Glucavance, please do not take 48 hours after completing test unless otherwise instructed.  Please allow 2-4 weeks for scheduling of routine cardiac CTs. Some insurance companies require a pre-authorization which may delay scheduling of this test.   For non-scheduling related questions, please contact the cardiac imaging nurse navigator should you have any questions/concerns: Marchia Bond, Cardiac Imaging Nurse Navigator Gordy Clement, Cardiac Imaging Nurse Navigator [facility] Direct Office Dial: [phone]   For scheduling needs, including cancellations and rescheduling, please call [place], [phone].

## 2020-08-30 ENCOUNTER — Telehealth: Payer: Self-pay | Admitting: Cardiovascular Disease

## 2020-08-30 DIAGNOSIS — E785 Hyperlipidemia, unspecified: Secondary | ICD-10-CM

## 2020-08-30 NOTE — Telephone Encounter (Signed)
Pt returning phone call for results... please advise °

## 2020-08-30 NOTE — Telephone Encounter (Signed)
Spoke with patient and reviewed her lab results from [facility].  See lab notes 08/27/20. Pt will return on 8/30 for repeat CK level.  Will hold rosuvastatin until she hears back from our office.

## 2020-09-09 ENCOUNTER — Other Ambulatory Visit: Payer: Self-pay | Admitting: Physician Assistant

## 2020-09-09 DIAGNOSIS — M79604 Pain in right leg: Secondary | ICD-10-CM

## 2020-09-09 DIAGNOSIS — I739 Peripheral vascular disease, unspecified: Secondary | ICD-10-CM

## 2020-09-10 ENCOUNTER — Telehealth (HOSPITAL_COMMUNITY): Payer: Self-pay | Admitting: Emergency Medicine

## 2020-09-10 NOTE — Telephone Encounter (Signed)
Attempted to call patient regarding upcoming cardiac CT appointment. °Left message on voicemail with name and callback number °Abri Vacca RN Navigator Cardiac Imaging °[facility] °[phone] Office °[phone] Cell ° °

## 2020-09-13 ENCOUNTER — Telehealth (HOSPITAL_COMMUNITY): Payer: Self-pay | Admitting: Emergency Medicine

## 2020-09-13 NOTE — Telephone Encounter (Signed)
Reaching out to patient to offer assistance regarding upcoming cardiac imaging study; pt verbalizes understanding of appt date/time, parking situation and where to check in, pre-test NPO status and medications ordered, and verified current allergies; name and call back number provided for further questions should they arise Leah Bond RN Navigator Cardiac Imaging [facility] [phone] office [phone] cell  '100mg'$  metoprolol  Denies iv issues Denies claustro

## 2020-09-14 ENCOUNTER — Ambulatory Visit (HOSPITAL_COMMUNITY)
Admission: RE | Admit: 2020-09-14 | Discharge: 2020-09-14 | Disposition: A | Discharge status: Home / Self Care | Payer: Medicare Other | Source: Ambulatory Visit | Attending: Physician Assistant | Admitting: Physician Assistant

## 2020-09-14 ENCOUNTER — Other Ambulatory Visit: Payer: Self-pay

## 2020-09-14 ENCOUNTER — Other Ambulatory Visit: Payer: Medicare Other | Admitting: *Deleted

## 2020-09-14 DIAGNOSIS — E785 Hyperlipidemia, unspecified: Secondary | ICD-10-CM | POA: Diagnosis not present

## 2020-09-14 DIAGNOSIS — R072 Precordial pain: Secondary | ICD-10-CM | POA: Diagnosis not present

## 2020-09-14 LAB — CK: Total CK: 257 U/L — ABNORMAL HIGH (ref 32–182)

## 2020-09-14 MED ORDER — NITROGLYCERIN 0.4 MG SL SUBL
0.8000 mg | SUBLINGUAL_TABLET | Freq: Once | SUBLINGUAL | Status: AC
  Administered 2020-09-14: 0.8 mg via SUBLINGUAL

## 2020-09-14 MED ORDER — NITROGLYCERIN 0.4 MG SL SUBL
SUBLINGUAL_TABLET | SUBLINGUAL | Status: AC
  Filled 2020-09-14: qty 2

## 2020-09-14 MED ORDER — IOHEXOL 350 MG/ML SOLN
100.0000 mL | Freq: Once | INTRAVENOUS | Status: AC | PRN
  Administered 2020-09-14: 100 mL via INTRAVENOUS

## 2020-09-15 ENCOUNTER — Other Ambulatory Visit: Payer: Self-pay | Admitting: Physician Assistant

## 2020-09-15 ENCOUNTER — Telehealth: Payer: Self-pay

## 2020-09-15 ENCOUNTER — Ambulatory Visit (HOSPITAL_COMMUNITY)
Admission: RE | Admit: 2020-09-15 | Discharge: 2020-09-15 | Disposition: A | Discharge status: Home / Self Care | Payer: Medicare Other | Source: Ambulatory Visit | Attending: Internal Medicine | Admitting: Internal Medicine

## 2020-09-15 DIAGNOSIS — I739 Peripheral vascular disease, unspecified: Secondary | ICD-10-CM | POA: Diagnosis not present

## 2020-09-15 DIAGNOSIS — R202 Paresthesia of skin: Secondary | ICD-10-CM | POA: Diagnosis not present

## 2020-09-15 DIAGNOSIS — M79604 Pain in right leg: Secondary | ICD-10-CM | POA: Diagnosis not present

## 2020-09-15 DIAGNOSIS — M79605 Pain in left leg: Secondary | ICD-10-CM | POA: Diagnosis not present

## 2020-09-15 DIAGNOSIS — R200 Anesthesia of skin: Secondary | ICD-10-CM | POA: Diagnosis not present

## 2020-09-15 DIAGNOSIS — E785 Hyperlipidemia, unspecified: Secondary | ICD-10-CM

## 2020-09-15 NOTE — Telephone Encounter (Signed)
Pt aware of results and recommendations. She agrees to d/c statin and to be referred to lipid clinic. She will call her PCP regarding CK.

## 2020-09-15 NOTE — Telephone Encounter (Signed)
-----   Message from Charlie Pitter, [place] sent at 09/15/2020  7:06 AM EDT ----- Please let patient know CK level remains elevated. Remain off statin. Needs f/u primary care to discuss further evaluation, will CC PCP with results here (has been elevated in the past as well). Please set up referral to our lipid clinic to consider alternates in the meantime for her cholesterol. Keep f/u studies and workup as planned.

## 2020-09-17 ENCOUNTER — Other Ambulatory Visit: Payer: Self-pay

## 2020-09-17 ENCOUNTER — Ambulatory Visit (INDEPENDENT_AMBULATORY_CARE_PROVIDER_SITE_OTHER): Payer: Medicare Other | Admitting: Pharmacist

## 2020-09-17 DIAGNOSIS — I2510 Atherosclerotic heart disease of native coronary artery without angina pectoris: Secondary | ICD-10-CM | POA: Diagnosis not present

## 2020-09-17 DIAGNOSIS — E782 Mixed hyperlipidemia: Secondary | ICD-10-CM

## 2020-09-17 NOTE — Progress Notes (Signed)
Patient ID: Leah [name]                 DOB: 25-Dec-1950                    MRN: [place]:[id number]     HPI: TRISTI ECONOMY is a 70 y.o. female patient of Dr. Camillia Herter referred to lipid clinic by Melina Copa, PA. PMH is significant for HTN, HLD, nonobstructive CAD by coronary CT 2019, alpha gal allergy, depression, arthritis, anxiety, prior statin intolerance and renal mass pending surgical evaluation.  She was previously admitted to [facility] in 2019 with chest pain with coronary CTA showing mild nonobstructive CAD as below, no significant extracardiac findings She has been statin intolerant in the past with Lipitor as well as Zetia. She also did not tolerate Praluent. She had been on rosuvastatin and was feeling better on this. However she complained of bilateral leg pain at visit on 8/12. CK was checked and was high at 276. CK repeated 3 days ago and was 257. CK has been elevated in the past when not on a statin.  Patient presents today to lipid clinic. She reports that she has not heard anything from her PCP about her CK. She does not remember taking Zetia and I cannot find it in her past medications. Had a full body rash with Praluent. Knows that her cholesterol is probably genetic (baseline LDL >190) but also admits that she could eat better and does not exercise. Does not really want to take more pills. Is on a tight budget.  Current Medications: none Intolerances: praluent (full body rash/myalgias), rosuvastatin 5, 10, 20, '40mg'$ , atorvastatin '20mg'$  and '40mg'$ , ezetimibe '10mg'$ , pitavastatin '2mg'$  Risk Factors: CAC score 83.5 (79th percentile) LDL goal: <70  Diet: knows she needs to cut out more sugar, does not eat fried foods  Exercise: none  Family History: The patient's family history includes Alzheimer's disease in her mother; Asthma in her sister; Cancer in her cousin and cousin; Colon polyps in her sister; Diabetes in her brother, daughter, mother, and sister; Heart disease in her mother; Leukemia  in her brother; Myasthenia gravis in her daughter; Other in her father. There is no history of Colon cancer, Stomach cancer, Esophageal cancer, or Rectal cancer.  Social History:  Social History   Socioeconomic History   Marital status: Divorced    Spouse name: Not on file   Number of children: 3   Years of education: 12+   Highest education level: Some college, no degree  Occupational History   Occupation: private duty Manufacturing systems engineer  Tobacco Use   Smoking status: Former    Packs/day: 0.50    Years: 28.00    Pack years: 14.00    Types: Cigarettes    Quit date: 01/17/1996    Years since quitting: 24.6   Smokeless tobacco: Never  Vaping Use   Vaping Use: Never used  Substance and Sexual Activity   Alcohol use: Yes    Alcohol/week: 2.0 standard drinks    Types: 2 Glasses of wine per week    Comment: occasional use of wine    Drug use: No   Sexual activity: Not Currently    Partners: Male    Birth control/protection: Surgical  Other Topics Concern   Not on file  Social History Narrative   Lives alone.    Social Determinants of Health   Financial Resource Strain: Low Risk    Difficulty of Paying Living Expenses: Not hard at all  Food Insecurity: No Food Insecurity   Worried About Charity fundraiser in the Last Year: Never true   Ran Out of Food in the Last Year: Never true  Transportation Needs: No Transportation Needs   Lack of Transportation (Medical): No   Lack of Transportation (Non-Medical): No  Physical Activity: Insufficiently Active   Days of Exercise per Week: 7 days   Minutes of Exercise per Session: 20 min  Stress: No Stress Concern Present   Feeling of Stress : Not at all  Social Connections: Moderately Integrated   Frequency of Communication with Friends and Family: More than three times a week   Frequency of Social Gatherings with Friends and Family: More than three times a week   Attends Religious Services: More than 4 times per year   Active Member  of [employer] or Organizations: Yes   Attends Music therapist: More than 4 times per year   Marital Status: Divorced  Human resources officer Violence: Not At Risk   Fear of Current or Ex-Partner: No   Emotionally Abused: No   Physically Abused: No   Sexually Abused: No     Labs: TC 168, TG 107, HDL 53, LDL 96 (rosuvastatin '20mg'$  daily)  Past Medical History:  Diagnosis Date   Allergy    Allergy to alpha-gal    Anxiety    Arthritis    Asthma    CAD (coronary artery disease)    Colon polyp    COVID-19 virus infection 05/2020   Depression    Food allergy    GERD (gastroesophageal reflux disease)    Hyperlipidemia    Hypertension    Renal cell carcinoma (HCC)    Urticaria     Current Outpatient Medications on File Prior to Visit  Medication Sig Dispense Refill   albuterol (PROAIR HFA) 108 (90 Base) MCG/ACT inhaler Inhale 2 puffs into the lungs every 4 (four) hours as needed for wheezing or shortness of breath. 8.5 g 3   albuterol (PROVENTIL) (2.5 MG/3ML) 0.083% nebulizer solution Take 3 mLs (2.5 mg total) by nebulization every 6 (six) hours as needed for wheezing or shortness of breath. 150 mL 6   Albuterol Sulfate 2.5 MG/0.5ML NEBU Take 0.5 mLs (2.5 mg total) by nebulization every 6 (six) hours as needed. 20 mL 3   aspirin 81 MG tablet Take 81 mg by mouth daily.     calcium carbonate (TUMS) 500 MG chewable tablet Chew 1 tablet (200 mg of elemental calcium total) by mouth as needed for indigestion or heartburn.     Cholecalciferol (VITAMIN D) 2000 UNITS tablet Take 2,000 Units by mouth daily.     cyclobenzaprine (FLEXERIL) 5 MG tablet Take 1 tablet (5 mg total) by mouth 3 (three) times daily as needed for muscle spasms. 30 tablet 1   EPINEPHrine 0.3 mg/0.3 mL IJ SOAJ injection Inject into the muscle once.     famotidine (PEPCID) 40 MG tablet Take 1 tablet (40 mg total) by mouth daily. (Patient taking differently: Take 40 mg by mouth as needed.) 90 tablet 3   fish oil-omega-3  fatty acids 1000 MG capsule Take 2 g by mouth daily.      fluticasone (FLONASE) 50 MCG/ACT nasal spray Place 2 sprays into both nostrils daily. 16 g 6   loratadine (CLARITIN) 10 MG tablet Take 10 mg by mouth 2 (two) times daily.     losartan-hydrochlorothiazide (HYZAAR) 100-12.5 MG tablet TAKE ONE TABLET BY MOUTH DAILY 90 tablet 1   metoprolol tartrate (  LOPRESSOR) 100 MG tablet Take 1 tablet (100 mg total) by mouth as directed. Take 1 tablet by mouth 2 hours before before your CT 1 tablet 0   Multiple Vitamins-Minerals (EMERGEN-C IMMUNE PO) Take by mouth.     pantoprazole (PROTONIX) 40 MG tablet Take 1 tablet (40 mg total) by mouth daily. For stomach 30 tablet 11   rosuvastatin (CRESTOR) 20 MG tablet Take 1 tablet (20 mg total) by mouth daily. (Patient not taking: Reported on 08/30/2020) 90 tablet 3   No current facility-administered medications on file prior to visit.    Allergies  Allergen Reactions   Codeine Nausea And Vomiting   Levaquin [Levofloxacin] Other (See Comments)    Achilles tendon pain   Meat [Alpha-Gal]    Penicillins Other (See Comments)    Does not know   Praluent [Alirocumab]     Rash and myalgias   Strawberry (Diagnostic)     Assessment/Plan:  1. Hyperlipidemia - LDL is above goal of <70. She is off of rosuvastatin due to elevation in CK and leg pain. Although CK is not significantly elevated, with her leg pain, I do think its best that it is stopped. CK elevated even off of statins. I wonder if a workup of this is warranted. Will not challenge with Repatha due to extend of rash with Praluent. Could consider zetia alone, although it wont be supper effective. Could try nexlizet, but it appears it is non-formulary so patient would not be able to afford even if approved. Could look into Leqvio for patient. If affordable, might not be able to get first injection for several months due to billing issues with it being a buy and bill.  Right now, patient doesn't wish to try  any medications, but she has the names of all of them to do research and think about them. She is going to work on diet and exercise in the internum. She has been referred to the [facility] prep class. She will let us know if she would like [place] to look into the cost of any of the medications for her.   Thank you,   Ramond Dial, Pharm.D, BCPS, CPP [place]  [id number] [place], [place] [address]  Phone: [phone]; Fax: [phone]

## 2020-09-17 NOTE — Patient Instructions (Addendum)
Nexletol Leqvio Zetia  Look out for a call from [facility] about the [employer] prep class

## 2020-09-23 ENCOUNTER — Ambulatory Visit (HOSPITAL_COMMUNITY): Payer: Medicare Other | Attending: Physician Assistant

## 2020-09-23 ENCOUNTER — Telehealth: Payer: Self-pay | Admitting: Cardiovascular Disease

## 2020-09-23 ENCOUNTER — Other Ambulatory Visit: Payer: Self-pay

## 2020-09-23 DIAGNOSIS — R072 Precordial pain: Secondary | ICD-10-CM | POA: Diagnosis not present

## 2020-09-23 LAB — ECHOCARDIOGRAM COMPLETE
Area-P 1/2: 3.03 cm2
S' Lateral: 2.3 cm

## 2020-09-23 NOTE — Telephone Encounter (Signed)
Patient is returning call to discuss her echo results.

## 2020-09-23 NOTE — Telephone Encounter (Signed)
Returned the call to pt.  She has been made aware of her echocardiogram results and she verbalized understanding.

## 2020-09-23 NOTE — Telephone Encounter (Signed)
-----   Message from Charlie Pitter, [place] sent at 09/23/2020  1:24 PM EDT ----- Please let patient know echo overall reassuring. Normal heart pump function. The echocardiogram suggested there is some stiffening of the heart muscle, which is what we call diastolic dysfunction. In addition to good blood pressure control and achieving or maintaining a healthy weight, would advise to limit sodium in the diet. There is mild narrowing of the tricuspid valve. We can discuss her studies at follow-up OV later this month.

## 2020-10-12 ENCOUNTER — Encounter: Payer: Self-pay | Admitting: Physician Assistant

## 2020-10-12 NOTE — Progress Notes (Signed)
Cardiology Office Note    Date:  10/14/2020   ID:  [name], DOB 01-05-1951, MRN [id number]  PCP:  Janora Norlander, DO  Cardiologist:  Lauree Chandler, MD  Electrophysiologist:  None   Chief Complaint: f/u chest pain  History of Present Illness:   Leah [name] is a 70 y.o. female with history of HTN, HLD, nonobstructive CAD by (last CT 08/2020), alpha gal allergy, mild tricuspid stenosis, depression, arthritis, anxiety, elevated CK level, prior statin intolerance who presents for cardiac follow-up.  She was previously admitted to [facility] in 2019 with chest pain with coronary CTA showing mild nonobstructive CAD. She has been statin intolerant in the past with Lipitor as well as Zetia. She also did not tolerate Praluent due to hives. She was on rosuvastatin but this was discontinued at recent follow-up in 08/2020 due to elevated CK level. At that visit she was reporting atypical chest pain as well as paroxysmal leg discomfort. Coronary CTA showed calcium score of 63 (76th percentile), mild atherosclerosis, mild nonobstructive disease as outlined below and aortic atherosclerosis. LE vascular testing showed no significant PAD. 2D echo 09/2020 showed EF 55-60%, grade 1 DD, mild tricuspid stenosis. Her CK level was found to be elevated so rosuvastatin was discontinued. However, her elevated CK level persisted despite statin discontinuation so she was advised to f/u primary care for this. She saw lipid clinic 09/17/20 but declined to pursue additional options at that time. Of note, in 04/2020 she was found to have a kidney mass which is being followed by urology - she was previously offered surgery vs surveillance and she elected the latter with planned follow-up in February 2023.  She is seen back for follow-up overall doing well from a cardiac standpoint. She's only had one brief episode of left upper chest wall discomfort since last OV, lasting 10-12 seconds. She is otherwise able to  participate in activities like walking the dog without angina or dyspnea. She does continue to have episodic MSK issues including leg cramps (3 weeks ago) and low back discomfort (this weekend). She feels well today. She has not yet discussed her elevated CK level with PCP.  Labwork independently reviewed: 09/14/20 CK 257 (OFF statin) 08/27/20 LDL 96, WBC 3.2 HGb 12.9, TSH wnl, CMET wnl except glu 100 02/2020 CMET ok ex glu 107; K 4.1, WBC 3.2, Hgb 13 12/2019 LDL 132, Mg 2.0 03/2019 TSH wnl   Past Medical History:  Diagnosis Date   Allergy    Allergy to alpha-gal    Anxiety    Arthritis    Asthma    CAD (coronary artery disease)    Colon polyp    COVID-19 virus infection 05/2020   Depression    Elevated CK    Food allergy    GERD (gastroesophageal reflux disease)    Hyperlipidemia    Hypertension    Mild tricuspid stenosis    Renal cell carcinoma (HCC)    Urticaria     Past Surgical History:  Procedure Laterality Date   ABDOMINAL HYSTERECTOMY     Carpel Tunnel     CHOLECYSTECTOMY     COLONOSCOPY  02/08/2005   AST:[id number] hemorrhoids otherwise normal rectum/ A few scattered, shallow, left-sided sigmoid diverticula/The remainder of the colonic mucosa appeared normal   COLONOSCOPY WITH ESOPHAGOGASTRODUODENOSCOPY (EGD) N/A 11/29/2012   Procedure: COLONOSCOPY WITH ESOPHAGOGASTRODUODENOSCOPY (EGD);  Surgeon: Daneil Dolin, MD;  Location: [facility];  Service: Endoscopy;  Laterality: N/A;  10:45   CYST REMOVAL TRUNK  HERNIA REPAIR     ventral X 2 with mesh, last one 2011 ([place]). complicated with 2 week hospital stay   HERNIA REPAIR     with mesh   NISSEN FUNDOPLICATION  [id number]   POLYPECTOMY     ROTATOR CUFF REPAIR Left 04/04/2019   TUBAL LIGATION      Current Medications: Current Meds  Medication Sig   albuterol (PROAIR HFA) 108 (90 Base) MCG/ACT inhaler Inhale 2 puffs into the lungs every 4 (four) hours as needed for wheezing or shortness of breath.    albuterol (PROVENTIL) (2.5 MG/3ML) 0.083% nebulizer solution Take 3 mLs (2.5 mg total) by nebulization every 6 (six) hours as needed for wheezing or shortness of breath.   Albuterol Sulfate 2.5 MG/0.5ML NEBU Take 0.5 mLs (2.5 mg total) by nebulization every 6 (six) hours as needed.   aspirin 81 MG tablet Take 81 mg by mouth daily.   calcium carbonate (TUMS) 500 MG chewable tablet Chew 1 tablet (200 mg of elemental calcium total) by mouth as needed for indigestion or heartburn.   Cholecalciferol (VITAMIN D) 2000 UNITS tablet Take 2,000 Units by mouth daily.   EPINEPHrine 0.3 mg/0.3 mL IJ SOAJ injection Inject into the muscle once.   famotidine (PEPCID) 40 MG tablet Take 1 tablet (40 mg total) by mouth daily.   fish oil-omega-3 fatty acids 1000 MG capsule Take 2 g by mouth daily.    fluticasone (FLONASE) 50 MCG/ACT nasal spray Place 2 sprays into both nostrils daily.   loratadine (CLARITIN) 10 MG tablet Take 10 mg by mouth 2 (two) times daily.   losartan-hydrochlorothiazide (HYZAAR) 100-12.5 MG tablet TAKE ONE TABLET BY MOUTH DAILY   Multiple Vitamins-Minerals (EMERGEN-C IMMUNE PO) Take by mouth.   pantoprazole (PROTONIX) 40 MG tablet Take 1 tablet (40 mg total) by mouth daily. For stomach   She is not sure which of the 2 albuterol nebs she takes so was encouraged to double check the dosing when she gets home and disregard duplicate.  Allergies:   Codeine, Levaquin [levofloxacin], Meat [alpha-gal], Penicillins, Praluent [alirocumab], and Strawberry (diagnostic)   Social History   Socioeconomic History   Marital status: Divorced    Spouse name: Not on file   Number of children: 3   Years of education: 12+   Highest education level: Some college, no degree  Occupational History   Occupation: private duty Manufacturing systems engineer  Tobacco Use   Smoking status: Former    Packs/day: 0.50    Years: 28.00    Pack years: 14.00    Types: Cigarettes    Quit date: 01/17/1996    Years since quitting: 24.7    Smokeless tobacco: Never  Vaping Use   Vaping Use: Never used  Substance and Sexual Activity   Alcohol use: Yes    Alcohol/week: 2.0 standard drinks    Types: 2 Glasses of wine per week    Comment: occasional use of wine    Drug use: No   Sexual activity: Not Currently    Partners: Male    Birth control/protection: Surgical  Other Topics Concern   Not on file  Social History Narrative   Lives alone.    Social Determinants of Health   Financial Resource Strain: Low Risk    Difficulty of Paying Living Expenses: Not hard at all  Food Insecurity: No Food Insecurity   Worried About Charity fundraiser in the Last Year: Never true   Ran Out of Food in the Last Year: Never true  Transportation Needs: No Data processing manager (Medical): No   Lack of Transportation (Non-Medical): No  Physical Activity: Insufficiently Active   Days of Exercise per Week: 7 days   Minutes of Exercise per Session: 20 min  Stress: No Stress Concern Present   Feeling of Stress : Not at all  Social Connections: Moderately Integrated   Frequency of Communication with Friends and Family: More than three times a week   Frequency of Social Gatherings with Friends and Family: More than three times a week   Attends Religious Services: More than 4 times per year   Active Member of [employer] or Organizations: Yes   Attends Music therapist: More than 4 times per year   Marital Status: Divorced     Family History:  The patient's family history includes Alzheimer's disease in her mother; Asthma in her sister; Cancer in her cousin and cousin; Colon polyps in her sister; Diabetes in her brother, daughter, mother, and sister; Heart disease in her mother; Leukemia in her brother; Myasthenia gravis in her daughter; Other in her father. There is no history of Colon cancer, Stomach cancer, Esophageal cancer, or Rectal cancer.  ROS:   Please see the history of present illness.  All  other systems are reviewed and otherwise negative.    EKGs/Labs/Other Studies Reviewed:    Studies reviewed are outlined and summarized above. Reports included below if pertinent.  Cor CT 08/2020 FINDINGS: A 120 kV prospective scan was triggered in the descending thoracic aorta at 111 HU's. Axial non-contrast 3 mm slices were carried out through the heart. The data set was analyzed on a dedicated work station and scored using the [place]. Gantry rotation speed was 250 msecs and collimation was .6 mm. No beta blockade and 0.8 mg of sl NTG was given. The 3D data set was reconstructed in 5% intervals of the 67-82 % of the R-R cycle. Diastolic phases were analyzed on a dedicated work station using MPR, MIP and VRT modes. The patient received 80 cc of contrast.   Aorta:  Normal size.  No calcifications.  No dissection.   Aortic Valve:  Trileaflet.  No calcifications.   Coronary Arteries:  Normal coronary origin.  Right dominance.   RCA is a large dominant artery that gives rise to PDA and PLVB. There is mild calcified plaque in the proximal RCA with associated stenosis of 25-49%.   Left main is a large artery that gives rise to LAD, Ramus and LCX arteries. There is minimal calcified plaque in the ostial LM with associated stenosis of 1-24%.   Ramus is a large vessel with mild calcified plaque in the proximal to mid vessel with associated stenosis of 25-49%.   LAD is a large vessel that has no plaque. There is minimal calcified plaque in the ostial LAD with associated stenosis of 1-24%.   LCX is a non-dominant artery that gives rise to one large OM1 branch. There is no plaque.   Other findings:   Normal pulmonary vein drainage into the left atrium.   Normal let atrial appendage without a thrombus.   Normal size of the pulmonary artery.   Mitral annular calcifications.   IMPRESSION: 1. Coronary calcium score of 63.3. This was 76th percentile for age and sex  matched control.   2.  Normal coronary origin with right dominance.   3.  Mild atherosclerosis.  CAD RADS 2.   4.  Consider non atherosclerotic causes of chest pain.  5.  Recommend preventive therapy and risk factor modification.   Fransico Him   Electronically Signed: By: Fransico Him M.D. On: 09/14/2020 14:03  Overread IMPRESSION: 1.  Aortic Atherosclerosis (ICD10-I70.0).   2D Echo 09/2020   1. Left ventricular ejection fraction, by estimation, is 55 to 60%. Left  ventricular ejection fraction by 3D volume is 58 %. The left ventricle has  normal function. There is mild concentric left ventricular hypertrophy.  Left ventricular diastolic  parameters are consistent with Grade I diastolic dysfunction (impaired  relaxation). The average left ventricular global longitudinal strain is  -21.6 %. The global longitudinal strain is normal.   2. Right ventricular systolic function is normal. The right ventricular  size is normal. Tricuspid regurgitation signal is inadequate for assessing  PA pressure.   3. The mitral valve is normal in structure. Trivial mitral valve  regurgitation. No evidence of mitral stenosis.   4. Suggest limited study with definity contrast to better   5. Mild tricuspid stenosis.   6. The aortic valve is normal in structure. Aortic valve regurgitation is  not visualized. No aortic stenosis is present.   7. The inferior vena cava is normal in size with greater than 50%  respiratory variability, suggesting right atrial pressure of 3 mmHg.    LE arterial testing 08/2020 Summary:  Right: Resting right ankle-brachial index is within normal range. No  evidence of significant right lower extremity arterial disease. The right  toe-brachial index is normal.   Left: Resting left ankle-brachial index is within normal range. No  evidence of significant left lower extremity arterial disease. The left  toe-brachial index is normal.     EKG:  EKG is not ordered  today  Recent Labs: 08/27/2020: ALT 27; BUN 13; Creatinine, Ser 0.94; Hemoglobin 12.9; Magnesium 2.0; Platelets 269; Potassium 4.1; Sodium 140; TSH 1.290  Recent Lipid Panel    Component Value Date/Time   CHOL 168 08/27/2020 1121   TRIG 107 08/27/2020 1121   TRIG 79 02/17/2016 0817   HDL 53 08/27/2020 1121   HDL 53 02/17/2016 0817   CHOLHDL 3.2 08/27/2020 1121   CHOLHDL 3.5 06/11/2007 0450   VLDL 20 06/11/2007 0450   LDLCALC 96 08/27/2020 1121   LDLCALC 171 (H) 08/25/2013 0810    PHYSICAL EXAM:    VS:  BP 122/70   Pulse 91   Ht 5\' 4"  (1.626 m)   Wt 194 lb 12.8 oz (88.4 kg)   SpO2 99%   BMI 33.44 kg/m   BMI: Body mass index is 33.44 kg/m.  GEN: Well nourished, well developed female in no acute distress HEENT: normocephalic, atraumatic Neck: no JVD, carotid bruits, or masses Cardiac: RRR; no murmurs, rubs, or gallops, no edema  Respiratory:  clear to auscultation bilaterally, normal work of breathing GI: soft, nontender, nondistended, + BS MS: no deformity or atrophy Skin: warm and dry, no rash Neuro:  Alert and Oriented x 3, Strength and sensation are intact, follows commands Psych: euthymic mood, full affect  Wt Readings from Last 3 Encounters:  10/14/20 194 lb 12.8 oz (88.4 kg)  08/27/20 196 lb 3.2 oz (89 kg)  07/27/20 195 lb 12.8 oz (88.8 kg)     ASSESSMENT & PLAN:   1. Atypical chest pain, with CAD with hyperlipidemia goal LDL <70 - at this juncture, her infrequent atypical CP does not seem to be cardiac in nature. Query neuropathic etiology. She is found to have mild nonobstructive CAD on CT. We'll continue with baby aspirin  for now as well as BP control. Will keep statin on hold until she is seen by rheumatology (see below). The patient did not wish to pursue non-statin alternatives at lipid clinic OV.  2. Essential HTN - BP controlled, no changes made today.  3. Elevated CK level - this persisted despite discontinuation of statin, so I wonder whether she has  an underlying myopathy. She continues to have episodic leg cramps and other various MSK issues. I think this needs further evaluation. She has not yet seen PCP for this. I will place referral to rheumatology but also reiterated request for her to contact her PCP to discuss further workup. I asked the patient to notify us when she's seen rheumatology so we can review recommendations at that time (and perhaps reach out to revisit whether it would be safe to reinitiate statin therapy).  4. Mild tricuspid stenosis - currently of no clinical significance. RV is normal. Will follow up in 6 months to review timing of next echocardiogram. There are no correlating murmurs on examination.    Disposition: F/u with Dr. Angelena Form in 6 months.   Medication Adjustments/Labs and Tests Ordered: Current medicines are reviewed at length with the patient today.  Concerns regarding medicines are outlined above. Medication changes, Labs and Tests ordered today are summarized above and listed in the Patient Instructions accessible in Encounters.   Signed, Charlie Pitter, PA-C  10/14/2020 11:29 AM    [address], [place], [place]  [address] Phone: [phone]; Fax: [phone]

## 2020-10-14 ENCOUNTER — Encounter: Payer: Self-pay | Admitting: Physician Assistant

## 2020-10-14 ENCOUNTER — Other Ambulatory Visit: Payer: Self-pay

## 2020-10-14 ENCOUNTER — Ambulatory Visit (INDEPENDENT_AMBULATORY_CARE_PROVIDER_SITE_OTHER): Payer: Medicare Other | Admitting: Physician Assistant

## 2020-10-14 VITALS — BP 122/70 | HR 91 | Ht 64.0 in | Wt 194.8 lb

## 2020-10-14 DIAGNOSIS — I10 Essential (primary) hypertension: Secondary | ICD-10-CM

## 2020-10-14 DIAGNOSIS — E785 Hyperlipidemia, unspecified: Secondary | ICD-10-CM | POA: Diagnosis not present

## 2020-10-14 DIAGNOSIS — I2510 Atherosclerotic heart disease of native coronary artery without angina pectoris: Secondary | ICD-10-CM | POA: Diagnosis not present

## 2020-10-14 DIAGNOSIS — R0789 Other chest pain: Secondary | ICD-10-CM

## 2020-10-14 DIAGNOSIS — I070 Rheumatic tricuspid stenosis: Secondary | ICD-10-CM | POA: Diagnosis not present

## 2020-10-14 DIAGNOSIS — R748 Abnormal levels of other serum enzymes: Secondary | ICD-10-CM

## 2020-10-14 NOTE — Patient Instructions (Addendum)
Medication Instructions:  Your physician recommends that you continue on your current medications as directed. Please refer to the Current Medication list given to you today.  *If you need a refill on your cardiac medications before your next appointment, please call your pharmacy*   Lab Work: None ordered  If you have labs (blood work) drawn today and your tests are completely normal, you will receive your results only by: [place] (if you have MyChart) OR A paper copy in the mail If you have any lab test that is abnormal or we need to change your treatment, we will call you to review the results.   Testing/Procedures: None ordered   You have been referred to Rheumatology.  They will contact you with an appointment.   Follow-Up: At [facility], you and your health needs are our priority.  As part of our continuing mission to provide you with exceptional heart care, we have created designated Provider Care Teams.  These Care Teams include your primary Cardiologist (physician) and Advanced Practice Providers (APPs -  Physician Assistants and Nurse Practitioners) who all work together to provide you with the care you need, when you need it.  We recommend signing up for the patient portal called "MyChart".  Sign up information is provided on this After Visit Summary.  MyChart is used to connect with patients for Virtual Visits (Telemedicine).  Patients are able to view lab/test results, encounter notes, upcoming appointments, etc.  Non-urgent messages can be sent to your provider as well.   To learn more about what you can do with MyChart, go to [email or web address].    Your next appointment:   6 month(s)  The format for your next appointment:   In Person  Provider:   You may see Lauree Chandler, MD or one of the following Advanced Practice Providers on your designated Care Team:   Melina Copa, PA-C Ermalinda Barrios, PA-C   Other Instructions  Please contact your  primary care provider today to be seen for evaluation of your muscle aches and elevated CK level.  Please check your Albuterol at home and disregard the duplicate.  We have 2 listed on your medication list, with 2 different doses.

## 2020-10-28 DIAGNOSIS — H2513 Age-related nuclear cataract, bilateral: Secondary | ICD-10-CM | POA: Diagnosis not present

## 2020-10-28 DIAGNOSIS — H04123 Dry eye syndrome of bilateral lacrimal glands: Secondary | ICD-10-CM | POA: Diagnosis not present

## 2020-10-28 DIAGNOSIS — H5203 Hypermetropia, bilateral: Secondary | ICD-10-CM | POA: Diagnosis not present

## 2020-10-29 ENCOUNTER — Ambulatory Visit (INDEPENDENT_AMBULATORY_CARE_PROVIDER_SITE_OTHER): Payer: Medicare Other | Admitting: Family Medicine

## 2020-10-29 ENCOUNTER — Encounter: Payer: Self-pay | Admitting: Family Medicine

## 2020-10-29 ENCOUNTER — Other Ambulatory Visit: Payer: Self-pay

## 2020-10-29 VITALS — BP 127/81 | HR 84 | Temp 98.4°F | Ht 64.0 in | Wt 195.0 lb

## 2020-10-29 DIAGNOSIS — R748 Abnormal levels of other serum enzymes: Secondary | ICD-10-CM | POA: Diagnosis not present

## 2020-10-29 DIAGNOSIS — Z23 Encounter for immunization: Secondary | ICD-10-CM | POA: Diagnosis not present

## 2020-10-29 DIAGNOSIS — C642 Malignant neoplasm of left kidney, except renal pelvis: Secondary | ICD-10-CM | POA: Diagnosis not present

## 2020-10-29 NOTE — Progress Notes (Signed)
Subjective: CC: Elevated CK PCP: Janora Norlander, DO WLS:LHTDSKA AADVIKA KONEN is a 70 y.o. female presenting to clinic today for:  1.  Elevated CK Patient was seen by her specialist recently and CK noted to be persistently elevated.  She is now off of all statins and no change in CK.  Uncertain etiology at this point of CK elevation so they referred her to rheumatology for further evaluation.  She denies any myalgia.  That seemed to resolve after discontinuation of statin.  No change in urine output.  She is trying to stay physically active and make good dietary decisions so that she can prolong health reduce cardiovascular risk.  2.  Renal mass This is being monitored by [facility] medical.  She has been very pleased with her consultation there.  She has follow-up sometime after the new year for repeat imaging and ongoing surveillance of this lesion.   ROS: Per HPI  Allergies  Allergen Reactions   Codeine Nausea And Vomiting   Levaquin [Levofloxacin] Other (See Comments)    Achilles tendon pain   Meat [Alpha-Gal]    Penicillins Other (See Comments)    Does not know   Praluent [Alirocumab]     Rash and myalgias   Strawberry (Diagnostic)    Past Medical History:  Diagnosis Date   Allergy    Allergy to alpha-gal    Anxiety    Arthritis    Asthma    CAD (coronary artery disease)    Colon polyp    COVID-19 virus infection 05/2020   Depression    Elevated CK    Food allergy    GERD (gastroesophageal reflux disease)    Hyperlipidemia    Hypertension    Mild tricuspid stenosis    Renal cell carcinoma (HCC)    Urticaria     Current Outpatient Medications:    albuterol (PROAIR HFA) 108 (90 Base) MCG/ACT inhaler, Inhale 2 puffs into the lungs every 4 (four) hours as needed for wheezing or shortness of breath., Disp: 8.5 g, Rfl: 3   albuterol (PROVENTIL) (2.5 MG/3ML) 0.083% nebulizer solution, Take 3 mLs (2.5 mg total) by nebulization every 6 (six) hours as needed  for wheezing or shortness of breath., Disp: 150 mL, Rfl: 6   Albuterol Sulfate 2.5 MG/0.5ML NEBU, Take 0.5 mLs (2.5 mg total) by nebulization every 6 (six) hours as needed., Disp: 20 mL, Rfl: 3   aspirin 81 MG tablet, Take 81 mg by mouth daily., Disp: , Rfl:    calcium carbonate (TUMS) 500 MG chewable tablet, Chew 1 tablet (200 mg of elemental calcium total) by mouth as needed for indigestion or heartburn., Disp: , Rfl:    Cholecalciferol (VITAMIN D) 2000 UNITS tablet, Take 2,000 Units by mouth daily., Disp: , Rfl:    cyclobenzaprine (FLEXERIL) 5 MG tablet, Take 1 tablet (5 mg total) by mouth 3 (three) times daily as needed for muscle spasms., Disp: 30 tablet, Rfl: 1   EPINEPHrine 0.3 mg/0.3 mL IJ SOAJ injection, Inject into the muscle once., Disp: , Rfl:    famotidine (PEPCID) 40 MG tablet, Take 1 tablet (40 mg total) by mouth daily., Disp: 90 tablet, Rfl: 3   fish oil-omega-3 fatty acids 1000 MG capsule, Take 2 g by mouth daily. , Disp: , Rfl:    fluticasone (FLONASE) 50 MCG/ACT nasal spray, Place 2 sprays into both nostrils daily., Disp: 16 g, Rfl: 6   loratadine (CLARITIN) 10 MG tablet, Take 10 mg by mouth 2 (two)  times daily., Disp: , Rfl:    losartan-hydrochlorothiazide (HYZAAR) 100-12.5 MG tablet, TAKE ONE TABLET BY MOUTH DAILY, Disp: 90 tablet, Rfl: 1   metoprolol tartrate (LOPRESSOR) 100 MG tablet, Take 1 tablet (100 mg total) by mouth as directed. Take 1 tablet by mouth 2 hours before before your CT, Disp: 1 tablet, Rfl: 0   Multiple Vitamins-Minerals (EMERGEN-C IMMUNE PO), Take by mouth., Disp: , Rfl:    pantoprazole (PROTONIX) 40 MG tablet, Take 1 tablet (40 mg total) by mouth daily. For stomach, Disp: 30 tablet, Rfl: 11 Social History   Socioeconomic History   Marital status: Divorced    Spouse name: Not on file   Number of children: 3   Years of education: 12+   Highest education level: Some college, no degree  Occupational History   Occupation: private duty Manufacturing systems engineer   Tobacco Use   Smoking status: Former    Packs/day: 0.50    Years: 28.00    Pack years: 14.00    Types: Cigarettes    Quit date: 01/17/1996    Years since quitting: 24.8   Smokeless tobacco: Never  Vaping Use   Vaping Use: Never used  Substance and Sexual Activity   Alcohol use: Yes    Alcohol/week: 2.0 standard drinks    Types: 2 Glasses of wine per week    Comment: occasional use of wine    Drug use: No   Sexual activity: Not Currently    Partners: Male    Birth control/protection: Surgical  Other Topics Concern   Not on file  Social History Narrative   Lives alone.    Social Determinants of Health   Financial Resource Strain: Low Risk    Difficulty of Paying Living Expenses: Not hard at all  Food Insecurity: No Food Insecurity   Worried About Charity fundraiser in the Last Year: Never true   [place] in the Last Year: Never true  Transportation Needs: No Transportation Needs   Lack of Transportation (Medical): No   Lack of Transportation (Non-Medical): No  Physical Activity: Insufficiently Active   Days of Exercise per Week: 7 days   Minutes of Exercise per Session: 20 min  Stress: No Stress Concern Present   Feeling of Stress : Not at all  Social Connections: Moderately Integrated   Frequency of Communication with Friends and Family: More than three times a week   Frequency of Social Gatherings with Friends and Family: More than three times a week   Attends Religious Services: More than 4 times per year   Active Member of [employer] or Organizations: Yes   Attends Music therapist: More than 4 times per year   Marital Status: Divorced  Human resources officer Violence: Not At Risk   Fear of Current or Ex-Partner: No   Emotionally Abused: No   Physically Abused: No   Sexually Abused: No   Family History  Problem Relation Age of Onset   Heart disease Mother    Alzheimer's disease Mother    Diabetes Mother    Other Father        deceased age 50 from  some sort of GI problems but no malignancy   Diabetes Sister    Leukemia Brother        CLL   Asthma Sister    Colon polyps Sister    Diabetes Brother    Cancer Cousin        pancreatic   Myasthenia gravis Daughter  Diabetes Daughter    Cancer Cousin        pancreatic   Colon cancer Neg Hx    Stomach cancer Neg Hx    Esophageal cancer Neg Hx    Rectal cancer Neg Hx     Objective: Office vital signs reviewed. BP 127/81   Pulse 84   Temp 98.4 F (36.9 C)   Ht 5\' 4"  (1.626 m)   Wt 195 lb (88.5 kg)   SpO2 99%   BMI 33.47 kg/m   Physical Examination:  General: Awake, alert, well nourished, No acute distress HEENT: Normal; sclera white.  No jaundice Cardio: regular rate and rhythm, S1S2 heard, no murmurs appreciated Pulm: clear to auscultation bilaterally, no wheezes, rhonchi or rales; normal work of breathing on room air MSK: Normal gait and station.  Normal tone  Assessment/ Plan: 70 y.o. female   Elevated CK - Plan: CK Reflex, ANA w/Reflex if Positive, C-reactive protein, Basic Metabolic Panel  Need for immunization against influenza - Plan: Flu Vaccine QUAD High Dose(Fluad)  Renal cell carcinoma of left kidney (HCC)  Uncertain etiology of elevated CK.  ANA, CRP and BMP were ordered in addition to repeat CK today.  No apparent inciting medications.  No apparent symptomology at this time.  She has referral in place for rheumatology already.  Influenza vaccination administered  RCC is being monitored closely by [facility] medical with anticipated repeat imaging after the new year  Orders Placed This Encounter  Procedures   Flu Vaccine QUAD High Dose(Fluad)   No orders of the defined types were placed in this encounter.    Janora Norlander, DO [place] [phone]

## 2020-11-02 LAB — BASIC METABOLIC PANEL
BUN/Creatinine Ratio: 17 (ref 12–28)
BUN: 16 mg/dL (ref 8–27)
CO2: 23 mmol/L (ref 20–29)
Calcium: 9.2 mg/dL (ref 8.7–10.3)
Chloride: 105 mmol/L (ref 96–106)
Creatinine, Ser: 0.96 mg/dL (ref 0.57–1.00)
Glucose: 97 mg/dL (ref 70–99)
Potassium: 4.2 mmol/L (ref 3.5–5.2)
Sodium: 141 mmol/L (ref 134–144)
eGFR: 64 mL/min/{1.73_m2} (ref >59)

## 2020-11-02 LAB — CK ISO,SERUM,FRAC.ONLY

## 2020-11-02 LAB — C-REACTIVE PROTEIN: CRP: 1 mg/L (ref 0–10)

## 2020-11-02 LAB — CK REFLEX: Total CK: 236 U/L — ABNORMAL HIGH (ref 32–182)

## 2020-11-02 LAB — ANA W/REFLEX IF POSITIVE: Anti Nuclear Antibody (ANA): NEGATIVE

## 2020-11-09 DIAGNOSIS — Z23 Encounter for immunization: Secondary | ICD-10-CM | POA: Diagnosis not present

## 2020-11-18 ENCOUNTER — Ambulatory Visit (INDEPENDENT_AMBULATORY_CARE_PROVIDER_SITE_OTHER): Payer: Medicare Other | Admitting: Internal Medicine

## 2020-11-18 ENCOUNTER — Encounter: Payer: Self-pay | Admitting: Internal Medicine

## 2020-11-18 ENCOUNTER — Other Ambulatory Visit: Payer: Self-pay

## 2020-11-18 VITALS — BP 156/85 | HR 63 | Ht 64.0 in | Wt 195.2 lb

## 2020-11-18 DIAGNOSIS — R252 Cramp and spasm: Secondary | ICD-10-CM

## 2020-11-18 DIAGNOSIS — G8929 Other chronic pain: Secondary | ICD-10-CM | POA: Diagnosis not present

## 2020-11-18 DIAGNOSIS — M791 Myalgia, unspecified site: Secondary | ICD-10-CM

## 2020-11-18 DIAGNOSIS — R748 Abnormal levels of other serum enzymes: Secondary | ICD-10-CM | POA: Diagnosis not present

## 2020-11-18 DIAGNOSIS — M25511 Pain in right shoulder: Secondary | ICD-10-CM

## 2020-11-18 NOTE — Progress Notes (Signed)
Office Visit Note  Patient: Leah [name]             Date of Birth: Aug 25, 1950           MRN: [id number]             PCP: Janora Norlander, DO Referring: Charlie Pitter, PA-C Visit Date: 11/18/2020  Subjective:   History of Present Illness: Leah [name] is a 70 y.o. female here for elevated CK. This was checked starting in August at that time experiencing some myalgia and discontinued rosuvastatin for concern of statin induced myalgia. Symptoms improved after stopping this medication but CK elevation remains. She feels ongoing symptoms biggest complaints now are muscle cramping or spasm in her upper legs especially at rest and with sleep. She also experiences numbness or paresthesia symptoms in some affected areas. There is some upper extremity weakness but has had past shoulder arthropathy so not sure if use related or impingement in previous doctors visits. She also had new incidentally identified left renal mass evaluated by urology currently observing with plan for MRI and surveillance. Workup was started after incidental abnormalities during evaluation of recurrent epigastric type pain with history of nissen fundoplication.  Besides the muscle and joint pains and numb sensations she has not noticed increased or new systemic symptom complaints. No skin rashes, no new swelling, ulcers, raynaud's, not suffering any falls.   Labs reviewed CK 10/29/2020 236 09/14/2020   257 08/27/2020   276 12/18/2016   214 06/11/2007   99  10/2020 ANA neg CRP 1 BMP wnl  Activities of Daily Living:  Patient reports morning stiffness for 30 minutes.   Patient Reports nocturnal pain.  Difficulty dressing/grooming: Denies Difficulty climbing stairs: Denies Difficulty getting out of chair: Denies Difficulty using hands for taps, buttons, cutlery, and/or writing: Denies  Review of Systems  Constitutional:  Negative for fatigue.  HENT:  Negative for mouth sores, mouth dryness and nose  dryness.   Eyes:  Positive for dryness. Negative for pain and itching.  Respiratory:  Negative for shortness of breath and difficulty breathing.   Cardiovascular:  Negative for chest pain and palpitations.  Gastrointestinal:  Negative for blood in stool, constipation and diarrhea.  Endocrine: Negative for increased urination.  Genitourinary:  Negative for difficulty urinating.  Musculoskeletal:  Positive for joint pain, joint pain and morning stiffness. Negative for joint swelling, myalgias, muscle tenderness and myalgias.  Skin:  Negative for color change, rash and redness.  Allergic/Immunologic: Negative for susceptible to infections.  Neurological:  Positive for numbness and parasthesias. Negative for dizziness, headaches, memory loss and weakness.  Hematological:  Negative for bruising/bleeding tendency.  Psychiatric/Behavioral:  Negative for confusion.    PMFS History:  Patient Active Problem List   Diagnosis Date Noted   Elevated CK 11/18/2020   Pain in right shoulder 11/18/2020   Muscle cramps 11/18/2020   Myalgia 11/18/2020   Bloating 07/02/2020   Flatulence 07/02/2020   Generalized abdominal pain 07/02/2020   Pelvic pain 06/15/2020   Cough 06/07/2020   CAD (coronary artery disease)    Hypertension    Tear of medial meniscus of knee 05/20/2020   COVID-19 05/2020   Hip pain 10/28/2019   Acute pain of right knee 10/28/2019   Right lower quadrant abdominal pain 10/28/2019   Internal hemorrhoids 04/11/2019   Constipation 04/11/2019   Rectal bleeding 07/13/2017   Arthropathy of left shoulder 07/13/2017   Hypokalemia 02/20/2017   Chest pain 01/17/2017   Adjustment disorder with  mixed anxiety and depressed mood 03/25/2013   Epigastric pain 11/12/2012   Nausea alone 11/12/2012   History of colonic polyps 11/12/2012   Hypertension, benign essential, goal below 140/90 10/30/2012   Hyperlipidemia 10/22/2012   GERD (gastroesophageal reflux disease) 10/22/2012   Asthma  10/22/2012    Past Medical History:  Diagnosis Date   Allergy    Allergy to alpha-gal    Anxiety    Arthritis    Asthma    CAD (coronary artery disease)    Colon polyp    COVID-19 virus infection 05/2020   Depression    Elevated CK    Food allergy    GERD (gastroesophageal reflux disease)    Hyperlipidemia    Hypertension    Mild tricuspid stenosis    Renal cell carcinoma (HCC)    Urticaria     Family History  Problem Relation Age of Onset   Heart disease Mother    Alzheimer's disease Mother    Diabetes Mother    Other Father        deceased age 22 from some sort of GI problems but no malignancy   Diabetes Sister    Asthma Sister    Colon polyps Sister    Leukemia Brother        CLL   Diabetes Brother    Diabetes Daughter    Myasthenia gravis Daughter    Pulmonary embolism Daughter    Healthy Son    Healthy Son    Cancer Cousin        pancreatic   Cancer Cousin        pancreatic   Colon cancer Neg Hx    Stomach cancer Neg Hx    Esophageal cancer Neg Hx    Rectal cancer Neg Hx    Past Surgical History:  Procedure Laterality Date   ABDOMINAL HYSTERECTOMY     Carpel Tunnel Right    CHOLECYSTECTOMY     COLONOSCOPY  02/08/2005   WUJ:[id number] hemorrhoids otherwise normal rectum/ A few scattered, shallow, left-sided sigmoid diverticula/The remainder of the colonic mucosa appeared normal   COLONOSCOPY WITH ESOPHAGOGASTRODUODENOSCOPY (EGD) N/A 11/29/2012   Procedure: COLONOSCOPY WITH ESOPHAGOGASTRODUODENOSCOPY (EGD);  Surgeon: Daneil Dolin, MD;  Location: [facility];  Service: Endoscopy;  Laterality: N/A;  10:45   HERNIA REPAIR     ventral X 2 with mesh, last one 2011 ([place]). complicated with 2 week hospital stay   [place]     with mesh   NISSEN FUNDOPLICATION  2956   POLYPECTOMY     ROTATOR CUFF REPAIR Left 04/04/2019   TUBAL LIGATION     Social History   Social History Narrative   Lives alone.    Immunization History  Administered  Date(s) Administered   Fluad Quad(high Dose 65+) 12/11/2018, 10/28/2019, 10/29/2020   Influenza,inj,Quad PF,6+ Mos 10/08/2012, 11/26/2013, 10/22/2014, 10/27/2015, 10/18/2016   Influenza,inj,quad, With Preservative 12/11/2018   Influenza-Unspecified 11/10/2016   Moderna Covid-19 Vaccine Bivalent Booster 43yrs & up 11/09/2020   Moderna Sars-Covid-2 Vaccination 02/21/2019, 03/22/2019, 11/25/2019   Pneumococcal Conjugate-13 12/18/2018   Pneumococcal Polysaccharide-23 10/17/2011, 04/05/2020   Td 02/16/2016   Tdap 02/16/2016     Objective: Vital Signs: BP (!) 156/85 (BP Location: Right Arm, Patient Position: Sitting, Cuff Size: Normal)   Pulse 63   Ht 5\' 4"  (1.626 m)   Wt 195 lb 3.2 oz (88.5 kg)   BMI 33.51 kg/m    Physical Exam Constitutional:      Appearance: She is obese.  HENT:     Right Ear: External ear normal.     Left Ear: External ear normal.     Mouth/Throat:     Mouth: Mucous membranes are moist.     Pharynx: Oropharynx is clear.  Eyes:     Conjunctiva/sclera: Conjunctivae normal.  Cardiovascular:     Rate and Rhythm: Normal rate and regular rhythm.  Pulmonary:     Effort: Pulmonary effort is normal.     Breath sounds: Normal breath sounds.  Musculoskeletal:     Right lower leg: No edema.     Left lower leg: No edema.  Skin:    General: Skin is warm and dry.     Findings: No rash.  Neurological:     Mental Status: She is alert.     Motor: No weakness.     Deep Tendon Reflexes: Reflexes normal.     Comments: 5/5 strength throughout  Psychiatric:        Mood and Affect: Mood normal.     Musculoskeletal Exam:  Neck full ROM no tenderness Shoulders right side pain with full abduction, no radiation Elbows full ROM no tenderness or swelling Wrists full ROM no tenderness or swelling Fingers full ROM no tenderness or swelling Knees full ROM no tenderness or swelling Ankles full ROM no tenderness or swelling 1st MTP bunion no palpable swelling or  erythema   Investigation: No additional findings.  Imaging: No results found.  Recent Labs: Lab Results  Component Value Date   WBC 3.2 (L) 08/27/2020   HGB 12.9 08/27/2020   PLT 269 08/27/2020   NA 141 10/29/2020   K 4.2 10/29/2020   CL 105 10/29/2020   CO2 23 10/29/2020   GLUCOSE 97 10/29/2020   BUN 16 10/29/2020   CREATININE 0.96 10/29/2020   BILITOT 0.4 08/27/2020   ALKPHOS 97 08/27/2020   AST 25 08/27/2020   ALT 27 08/27/2020   PROT 6.3 08/27/2020   ALBUMIN 4.2 08/27/2020   CALCIUM 9.2 10/29/2020   GFRAA 70 03/10/2020    Speciality Comments: No specialty comments available.  Procedures:  No procedures performed Allergies: Codeine, Levaquin [levofloxacin], Meat [alpha-gal], Penicillins, Praluent [alirocumab], and Strawberry (diagnostic)   Assessment / Plan:     Visit Diagnoses: Elevated CK  Muscle cramps Myalgia - Plan: Aldolase, 3-Hydroxy-3-Methylglutaryl-Coenzyme A Reductase (HMGCR) AB (IgG)  Nonspecific exam no focal weakness no edema no hyporeflexia. Statin associated myalgias should normally resolve unless development of persistent autoantibodies. Checking HMGCR Abs also checking aldolase for correlated with the modest CK elevation. If nothing is apparent may need to consider neurologic assessment possible NCS/EMG or other workup noninflammatory causes.  Chronic right shoulder pain  Symptoms more consistent with rotator cuff arthropathy or bursitis versus muscle problem at this time.    Orders: Orders Placed This Encounter  Procedures   Aldolase   3-Hydroxy-3-Methylglutaryl-Coenzyme A Reductase (HMGCR) AB (IgG)    No orders of the defined types were placed in this encounter.    Follow-Up Instructions: No follow-ups on file.   Collier Salina, MD  Note - This record has been created using [employer].  Chart creation errors have been sought, but may not always  have been located. Such creation errors do not reflect on  the standard  of medical care.

## 2020-11-18 NOTE — Patient Instructions (Signed)
Aldolase Test Why am I having this test? An aldolase test is used to help diagnose and monitor disorders that affect the muscles or liver. What is being tested? This test measures aldolase in your blood. Aldolase is a natural chemical (enzyme) that is found in most tissues of the body. It helps to convert blood sugar (glucose) into energy. The amount of aldolase in the blood increases when there is muscle or liver damage. Many different medicines can also cause increased levels of this enzyme. What kind of sample is taken? A blood sample is required for this test. It is usually collected by inserting a needle into a blood vessel. How do I prepare for this test? Follow instructions from your health care provider about eating or drinking restrictions. You may need to stop eating or drinking (may need to fast) for a short period of time before the test to get more accurate results. On the day before the test and the day of the test, avoid activities and exercises that take a lot of effort. Tell a health care provider about: All medicines you are taking, including vitamins, herbs, eye drops, creams, and over-the-counter medicines. Any medical conditions you have. How are the results reported? Your test results will be reported as values. Your health care provider will compare your results to normal ranges that were established after testing a large group of people (reference ranges). Reference ranges may vary among labs and hospitals. For this test, common reference ranges are: Adult: 1-7.5 units/L. Children: Newborn to 30 days: 6-32 units/L. Age 58 month to 6 years: 3-12 units/L. Age 35-17 years: 3.3-9.7 units/L. What do the results mean? If results are within the reference range, they are considered normal.  Increased levels of aldolase may be a sign of: A muscular disease, such as muscular dystrophy, dermatomyositis, or polymyositis. Muscle injury. Liver disease. Heart attack. Anemia. An  infection, such as trichinosis or mononucleosis. Decreased levels of aldolase may be a sign of: Muscle-wasting diseases. Being unable to absorb a natural sugar that is in things like fruit, fruit juices, and honey (hereditary fructose intolerance). Late-stage muscular dystrophy (MD). Talk with your health care provider about what your results mean. Questions to ask your health care provider Ask your health care provider, or the department that is doing the test: When will my results be ready? How will I get my results? What are my treatment options? What other tests do I need? What are my next steps? Summary An aldolase test is used to help diagnose and monitor disorders that affect the muscles or liver. Aldolase is a natural chemical (enzyme) that is found in most tissues of the body. The amount of aldolase in the blood increases when there is muscle or liver damage. For this test, a blood sample is usually collected by inserting a needle into a blood vessel. Talk with your health care provider about what your results mean. This information is not intended to replace advice given to you by your health care provider. Make sure you discuss any questions you have with your health care provider. Document Revised: 09/26/2019 Document Reviewed: 09/26/2019 Elsevier Patient Education  2022 [employer].

## 2020-11-24 LAB — HMGCR AB (IGG): HMGCR AB (IGG): <2 CU (ref <20)

## 2020-11-24 LAB — ALDOLASE: Aldolase: 4.5 U/L (ref <=8.1)

## 2020-12-12 NOTE — Progress Notes (Signed)
Office Visit Note  Patient: Leah [name]             Date of Birth: [birth year]           MRN: [id number]             PCP: Janora Norlander, DO Referring: Janora Norlander, DO Visit Date: 12/13/2020   Subjective:  Follow-up (Aching, left lower extremity leg pain, left great toe aching at night)   History of Present Illness: Leah [name] is a 70 y.o. female here for follow up for elevated CK and myalgias after initial visit at that time aldolase level was negative and HMGCR Abs negative. She continues having generalized muscle pains in many areas but especially with cramping or spasm type pain in the left thigh wakes her from sleeping. Sometimes she experiences numbness or sharp pain in the left great toe without radiation. She has also had left sided pain in the upper neck preventing free rotation of her head until it releases. No new weakness but her mobility is limited by pain and stiffness.  Previous HPI 11/18/20 Leah [name] is a 70 y.o. female here for elevated CK. This was checked starting in August at that time experiencing some myalgia and discontinued rosuvastatin for concern of statin induced myalgia. Symptoms improved after stopping this medication but CK elevation remains. She feels ongoing symptoms biggest complaints now are muscle cramping or spasm in her upper legs especially at rest and with sleep. She also experiences numbness or paresthesia symptoms in some affected areas. There is some upper extremity weakness but has had past shoulder arthropathy so not sure if use related or impingement in previous doctors visits. She also had new incidentally identified left renal mass evaluated by urology currently observing with plan for MRI and surveillance. Workup was started after incidental abnormalities during evaluation of recurrent epigastric type pain with history of nissen fundoplication.  Besides the muscle and joint pains and numb sensations she has not noticed  increased or new systemic symptom complaints. No skin rashes, no new swelling, ulcers, raynaud's, not suffering any falls.     Labs reviewed CK 10/29/2020 236 09/14/2020   257 08/27/2020   276 12/18/2016   214 06/11/2007   99   10/2020 ANA neg  Review of Systems  Constitutional:  Positive for fatigue.  HENT:  Negative for mouth dryness.   Eyes:  Positive for dryness.  Respiratory:  Negative for shortness of breath.   Cardiovascular:  Negative for swelling in legs/feet.  Gastrointestinal:  Positive for constipation.  Endocrine: Positive for heat intolerance and increased urination.  Genitourinary:  Negative for difficulty urinating.  Musculoskeletal:  Positive for joint pain, joint pain, muscle weakness and morning stiffness.  Skin:  Negative for rash.  Allergic/Immunologic: Negative for susceptible to infections.  Neurological:  Positive for numbness.  Hematological:  Negative for bruising/bleeding tendency.  Psychiatric/Behavioral:  Positive for sleep disturbance.    PMFS History:  Patient Active Problem List   Diagnosis Date Noted   Elevated CK 11/18/2020   Pain in right shoulder 11/18/2020   Muscle cramps 11/18/2020   Myalgia 11/18/2020   Bloating 07/02/2020   Flatulence 07/02/2020   Generalized abdominal pain 07/02/2020   Pelvic pain 06/15/2020   Cough 06/07/2020   CAD (coronary artery disease)    Hypertension    Tear of medial meniscus of knee 05/20/2020   COVID-19 05/2020   Hip pain 10/28/2019   Acute pain of right knee 10/28/2019  Right lower quadrant abdominal pain 10/28/2019   Internal hemorrhoids 04/11/2019   Constipation 04/11/2019   Rectal bleeding 07/13/2017   Arthropathy of left shoulder 07/13/2017   Hypokalemia 02/20/2017   Chest pain 01/17/2017   Adjustment disorder with mixed anxiety and depressed mood 03/25/2013   Epigastric pain 11/12/2012   Nausea alone 11/12/2012   History of colonic polyps 11/12/2012   Hypertension, benign essential, goal  below 140/90 10/30/2012   Hyperlipidemia 10/22/2012   GERD (gastroesophageal reflux disease) 10/22/2012   Asthma 10/22/2012    Past Medical History:  Diagnosis Date   Allergy    Allergy to alpha-gal    Anxiety    Arthritis    Asthma    CAD (coronary artery disease)    Colon polyp    COVID-19 virus infection 05/2020   Depression    Elevated CK    Food allergy    GERD (gastroesophageal reflux disease)    Hyperlipidemia    Hypertension    Mild tricuspid stenosis    Renal cell carcinoma (HCC)    Urticaria     Family History  Problem Relation Age of Onset   Heart disease Mother    Alzheimer's disease Mother    Diabetes Mother    Other Father        deceased age 5 from some sort of GI problems but no malignancy   Diabetes Sister    Asthma Sister    Colon polyps Sister    Leukemia Brother        CLL   Diabetes Brother    Diabetes Daughter    Myasthenia gravis Daughter    Pulmonary embolism Daughter    Healthy Son    Healthy Son    Cancer Cousin        pancreatic   Cancer Cousin        pancreatic   Colon cancer Neg Hx    Stomach cancer Neg Hx    Esophageal cancer Neg Hx    Rectal cancer Neg Hx    Past Surgical History:  Procedure Laterality Date   ABDOMINAL HYSTERECTOMY     Carpel Tunnel Right    CHOLECYSTECTOMY     COLONOSCOPY  02/08/2005   PJK:[id number] hemorrhoids otherwise normal rectum/ A few scattered, shallow, left-sided sigmoid diverticula/The remainder of the colonic mucosa appeared normal   COLONOSCOPY WITH ESOPHAGOGASTRODUODENOSCOPY (EGD) N/A 11/29/2012   Procedure: COLONOSCOPY WITH ESOPHAGOGASTRODUODENOSCOPY (EGD);  Surgeon: Daneil Dolin, MD;  [facility];  Service: Endoscopy;  Laterality: N/A;  10:45   HERNIA REPAIR     ventral X 2 with mesh, last one 2011 ([place]). complicated with 2 week hospital stay   [place]     with mesh   NISSEN FUNDOPLICATION  5809   POLYPECTOMY     ROTATOR CUFF REPAIR Left 04/04/2019   TUBAL  LIGATION     Social History   Social History Narrative   Lives alone.    Immunization History  Administered Date(s) Administered   Fluad Quad(high Dose 65+) 12/11/2018, 10/28/2019, 10/29/2020   Influenza,inj,Quad PF,6+ Mos 10/08/2012, 11/26/2013, 10/22/2014, 10/27/2015, 10/18/2016   Influenza,inj,quad, With Preservative 12/11/2018   Influenza-Unspecified 11/10/2016   Moderna Covid-19 Vaccine Bivalent Booster 48yrs & up 11/09/2020   Moderna Sars-Covid-2 Vaccination 02/21/2019, 03/22/2019, 11/25/2019   Pneumococcal Conjugate-13 12/18/2018   Pneumococcal Polysaccharide-23 10/17/2011, 04/05/2020   Td 02/16/2016   Tdap 02/16/2016     Objective: Vital Signs: BP 120/71 (BP Location: Left Arm, Patient Position: Sitting, Cuff Size: Normal)  Pulse 89   Resp 15   Ht 5\' 4"  (1.626 m)   Wt 196 lb (88.9 kg)   BMI 33.64 kg/m    Physical Exam Constitutional:      Appearance: She is obese.  Cardiovascular:     Rate and Rhythm: Normal rate and regular rhythm.  Pulmonary:     Effort: Pulmonary effort is normal.     Breath sounds: Normal breath sounds.  Musculoskeletal:     Right lower leg: No edema.     Left lower leg: No edema.  Skin:    General: Skin is warm and dry.  Neurological:     Mental Status: She is alert.     Comments: Strength 5/5 throughout  Psychiatric:        Mood and Affect: Mood normal.     Musculoskeletal Exam:  Neck full ROM no tenderness Elbows full ROM no tenderness or swelling Wrists full ROM no tenderness or swelling Fingers full ROM no tenderness or swelling Knees full ROM no tenderness or swelling Ankles full ROM no tenderness or swelling Bunion present 1st MTP no associated swelling, erythema, or warmth    Investigation: No additional findings.  Imaging: No results found.  Recent Labs: Lab Results  Component Value Date   WBC 3.2 (L) 08/27/2020   HGB 12.9 08/27/2020   PLT 269 08/27/2020   NA 141 10/29/2020   K 4.2 10/29/2020   CL 105  10/29/2020   CO2 23 10/29/2020   GLUCOSE 97 10/29/2020   BUN 16 10/29/2020   CREATININE 0.96 10/29/2020   BILITOT 0.4 08/27/2020   ALKPHOS 97 08/27/2020   AST 25 08/27/2020   ALT 27 08/27/2020   PROT 6.3 08/27/2020   ALBUMIN 4.2 08/27/2020   CALCIUM 9.2 10/29/2020   GFRAA 70 03/10/2020    Speciality Comments: No specialty comments available.  Procedures:  No procedures performed Allergies: Codeine, Levaquin [levofloxacin], Meat [alpha-gal], Penicillins, Praluent [alirocumab], and Strawberry (diagnostic)   Assessment / Plan:     Visit Diagnoses: Elevated CK  Muscle cramps Myalgias - Plan: Ambulatory referral to Neurology  Mild but persistent elevated lab, normal aldolase suggesting against clinically significant skeletal muscle inflammation. Exam today again showing no inflammatory changes. With ongoing myalgias and muscle spasms I recommend she see neurology for possible NCS/EMG especially with left lower extremity symptoms. Left sided neck pain and stiffness reported periodically but normal on exam today.   Orders: Orders Placed This Encounter  Procedures   Ambulatory referral to Neurology    No orders of the defined types were placed in this encounter.    Follow-Up Instructions: No follow-ups on file.   Collier Salina, MD  Note - This record has been created using [employer].  Chart creation errors have been sought, but may not always  have been located. Such creation errors do not reflect on  the standard of medical care.

## 2020-12-13 ENCOUNTER — Encounter: Payer: Self-pay | Admitting: Internal Medicine

## 2020-12-13 ENCOUNTER — Ambulatory Visit (INDEPENDENT_AMBULATORY_CARE_PROVIDER_SITE_OTHER): Payer: Medicare Other | Admitting: Internal Medicine

## 2020-12-13 ENCOUNTER — Other Ambulatory Visit: Payer: Self-pay

## 2020-12-13 VITALS — BP 120/71 | HR 89 | Resp 15 | Ht 64.0 in | Wt 196.0 lb

## 2020-12-13 DIAGNOSIS — R252 Cramp and spasm: Secondary | ICD-10-CM

## 2020-12-13 DIAGNOSIS — R748 Abnormal levels of other serum enzymes: Secondary | ICD-10-CM | POA: Diagnosis not present

## 2020-12-13 DIAGNOSIS — M791 Myalgia, unspecified site: Secondary | ICD-10-CM

## 2020-12-19 ENCOUNTER — Other Ambulatory Visit: Payer: Self-pay | Admitting: Family Medicine

## 2020-12-21 ENCOUNTER — Ambulatory Visit (INDEPENDENT_AMBULATORY_CARE_PROVIDER_SITE_OTHER): Payer: Medicare Other | Admitting: *Deleted

## 2020-12-21 DIAGNOSIS — Z111 Encounter for screening for respiratory tuberculosis: Secondary | ICD-10-CM

## 2020-12-23 ENCOUNTER — Ambulatory Visit: Payer: Medicare Other | Admitting: *Deleted

## 2020-12-23 DIAGNOSIS — Z111 Encounter for screening for respiratory tuberculosis: Secondary | ICD-10-CM

## 2020-12-23 LAB — TB SKIN TEST
Induration: 0 mm
TB Skin Test: NEGATIVE

## 2020-12-23 NOTE — Progress Notes (Signed)
PPD read and negative - pt papers given   Tolerated well-jhb

## 2021-01-25 ENCOUNTER — Other Ambulatory Visit: Payer: Self-pay | Admitting: Family Medicine

## 2021-01-25 DIAGNOSIS — Z1231 Encounter for screening mammogram for malignant neoplasm of breast: Secondary | ICD-10-CM

## 2021-02-03 ENCOUNTER — Other Ambulatory Visit: Payer: Self-pay

## 2021-02-03 ENCOUNTER — Ambulatory Visit
Admission: RE | Admit: 2021-02-03 | Discharge: 2021-02-03 | Disposition: A | Discharge status: Home / Self Care | Payer: Medicare Other | Source: Ambulatory Visit | Attending: Family Medicine | Admitting: Family Medicine

## 2021-02-03 DIAGNOSIS — Z1231 Encounter for screening mammogram for malignant neoplasm of breast: Secondary | ICD-10-CM

## 2021-02-03 NOTE — Progress Notes (Deleted)
No chief complaint on file.  History of Present Illness: 71 yo female with history of HTN, HLD and CAD who is here today for cardiac follow up. She was admitted to [facility] January 2019 with chest pain.and had a coronary CTA which showed mild non-obstructive CAD. She has been statin intolerant in the past. She has also not tolerated Zetia. She was seen in the lipid clinic 02/20/17 and was started on Praluent but she did not tolerate it. She has been on red yeast rice. She failed another trial of Lipitor.    She is here today for follow up. The patient denies any chest pain, dyspnea, palpitations, lower extremity edema, orthopnea, PND, dizziness, near syncope or syncope.   Primary Care Physician: Janora Norlander, DO  Past Medical History:  Diagnosis Date   Allergy    Allergy to alpha-gal    Anxiety    Arthritis    Asthma    CAD (coronary artery disease)    Colon polyp    COVID-19 virus infection 05/2020   Depression    Elevated CK    Food allergy    GERD (gastroesophageal reflux disease)    Hyperlipidemia    Hypertension    Mild tricuspid stenosis    Renal cell carcinoma (HCC)    Urticaria     Past Surgical History:  Procedure Laterality Date   ABDOMINAL HYSTERECTOMY     Carpel Tunnel Right    CHOLECYSTECTOMY     COLONOSCOPY  02/08/2005   NOB:SJGGEZMO hemorrhoids otherwise normal rectum/ A few scattered, shallow, left-sided sigmoid diverticula/The remainder of the colonic mucosa appeared normal   COLONOSCOPY WITH ESOPHAGOGASTRODUODENOSCOPY (EGD) N/A 11/29/2012   Procedure: COLONOSCOPY WITH ESOPHAGOGASTRODUODENOSCOPY (EGD);  Surgeon: Daneil Dolin, MD;  [facility];  Service: Endoscopy;  Laterality: N/A;  10:45   HERNIA REPAIR     ventral X 2 with mesh, last one 2011 ([place]). complicated with 2 week hospital stay   HERNIA REPAIR     with mesh   NISSEN FUNDOPLICATION  2947   POLYPECTOMY     ROTATOR CUFF REPAIR Left 04/04/2019   TUBAL LIGATION       Current Outpatient Medications  Medication Sig Dispense Refill   albuterol (PROAIR HFA) 108 (90 Base) MCG/ACT inhaler Inhale 2 puffs into the lungs every 4 (four) hours as needed for wheezing or shortness of breath. 8.5 g 3   albuterol (PROVENTIL) (2.5 MG/3ML) 0.083% nebulizer solution Take 3 mLs (2.5 mg total) by nebulization every 6 (six) hours as needed for wheezing or shortness of breath. 150 mL 6   Albuterol Sulfate 2.5 MG/0.5ML NEBU Take 0.5 mLs (2.5 mg total) by nebulization every 6 (six) hours as needed. 20 mL 3   aspirin 81 MG tablet Take 81 mg by mouth daily.     calcium carbonate (TUMS) 500 MG chewable tablet Chew 1 tablet (200 mg of elemental calcium total) by mouth as needed for indigestion or heartburn.     Cholecalciferol (VITAMIN D) 2000 UNITS tablet Take 2,000 Units by mouth daily.     cyclobenzaprine (FLEXERIL) 5 MG tablet Take 1 tablet (5 mg total) by mouth 3 (three) times daily as needed for muscle spasms. (Patient not taking: Reported on 11/18/2020) 30 tablet 1   EPINEPHrine 0.3 mg/0.3 mL IJ SOAJ injection Inject into the muscle once.     famotidine (PEPCID) 40 MG tablet Take 1 tablet (40 mg total) by mouth daily. 90 tablet 3   fish oil-omega-3 fatty acids 1000  MG capsule Take 2 g by mouth daily.      fluticasone (FLONASE) 50 MCG/ACT nasal spray Place 2 sprays into both nostrils daily. 16 g 6   loratadine (CLARITIN) 10 MG tablet Take 10 mg by mouth 2 (two) times daily.     losartan-hydrochlorothiazide (HYZAAR) 100-12.5 MG tablet TAKE ONE TABLET BY MOUTH DAILY 90 tablet 0   metoprolol tartrate (LOPRESSOR) 100 MG tablet Take 1 tablet (100 mg total) by mouth as directed. Take 1 tablet by mouth 2 hours before before your CT (Patient not taking: Reported on 11/18/2020) 1 tablet 0   Multiple Vitamins-Minerals (EMERGEN-C IMMUNE PO) Take by mouth.     pantoprazole (PROTONIX) 40 MG tablet Take 1 tablet (40 mg total) by mouth daily. For stomach (Patient not taking: Reported on  11/18/2020) 30 tablet 11   Polyvinyl Alcohol-Povidone (REFRESH OP) Apply to eye.     No current facility-administered medications for this visit.    Allergies  Allergen Reactions   Codeine Nausea And Vomiting   Levaquin [Levofloxacin] Other (See Comments)    Achilles tendon pain   Meat [Alpha-Gal]    Penicillins Other (See Comments)    Does not know   Praluent [Alirocumab]     Rash and myalgias   Strawberry (Diagnostic)     Social History   Socioeconomic History   Marital status: Divorced    Spouse name: Not on file   Number of children: 3   Years of education: 12+   Highest education level: Some college, no degree  Occupational History   Occupation: private duty Manufacturing systems engineer  Tobacco Use   Smoking status: Former    Packs/day: 0.50    Years: 28.00    Pack years: 14.00    Types: Cigarettes    Quit date: 01/17/1996    Years since quitting: 25.0   Smokeless tobacco: Never  Vaping Use   Vaping Use: Never used  Substance and Sexual Activity   Alcohol use: Yes    Alcohol/week: 2.0 standard drinks    Types: 2 Glasses of wine per week    Comment: occasional use of wine    Drug use: No   Sexual activity: Not Currently    Partners: Male    Birth control/protection: Surgical  Other Topics Concern   Not on file  Social History Narrative   Lives alone.    Social Determinants of Health   Financial Resource Strain: Low Risk    Difficulty of Paying Living Expenses: Not hard at all  Food Insecurity: No Food Insecurity   Worried About Charity fundraiser in the Last Year: Never true   [place] in the Last Year: Never true  Transportation Needs: No Transportation Needs   Lack of Transportation (Medical): No   Lack of Transportation (Non-Medical): No  Physical Activity: Insufficiently Active   Days of Exercise per Week: 7 days   Minutes of Exercise per Session: 20 min  Stress: No Stress Concern Present   Feeling of Stress : Not at all  Social Connections:  Moderately Integrated   Frequency of Communication with Friends and Family: More than three times a week   Frequency of Social Gatherings with Friends and Family: More than three times a week   Attends Religious Services: More than 4 times per year   Active Member of [employer] or Organizations: Yes   Attends Music therapist: More than 4 times per year   Marital Status: Divorced  Intimate Partner Violence: Not  At Risk   Fear of Current or Ex-Partner: No   Emotionally Abused: No   Physically Abused: No   Sexually Abused: No    Family History  Problem Relation Age of Onset   Heart disease Mother    Alzheimer's disease Mother    Diabetes Mother    Other Father        deceased age 60 from some sort of GI problems but no malignancy   Diabetes Sister    Asthma Sister    Colon polyps Sister    Leukemia Brother        CLL   Diabetes Brother    Diabetes Daughter    Myasthenia gravis Daughter    Pulmonary embolism Daughter    Healthy Son    Healthy Son    Cancer Cousin        pancreatic   Cancer Cousin        pancreatic   Colon cancer Neg Hx    Stomach cancer Neg Hx    Esophageal cancer Neg Hx    Rectal cancer Neg Hx     Review of Systems:  As stated in the HPI and otherwise negative.   There were no vitals taken for this visit.  Physical Examination: General: Well developed, well nourished, NAD  HEENT: OP clear, mucus membranes moist  SKIN: warm, dry. No rashes. Neuro: No focal deficits  Musculoskeletal: Muscle strength 5/5 all ext  Psychiatric: Mood and affect normal  Neck: No JVD, no carotid bruits, no thyromegaly, no lymphadenopathy.  Lungs:Clear bilaterally, no wheezes, rhonci, crackles Cardiovascular: Regular rate and rhythm. No murmurs, gallops or rubs. Abdomen:Soft. Bowel sounds present. Non-tender.  Extremities: No lower extremity edema. Pulses are 2 + in the bilateral DP/PT.  EKG:  EKG is *** ordered today. The ekg ordered today demonstrates    Recent Labs: 08/27/2020: ALT 27; Hemoglobin 12.9; Magnesium 2.0; Platelets 269; TSH 1.290 10/29/2020: BUN 16; Creatinine, Ser 0.96; Potassium 4.2; Sodium 141   Lipid Panel    Component Value Date/Time   CHOL 168 08/27/2020 1121   TRIG 107 08/27/2020 1121   TRIG 79 02/17/2016 0817   HDL 53 08/27/2020 1121   HDL 53 02/17/2016 0817   CHOLHDL 3.2 08/27/2020 1121   CHOLHDL 3.5 06/11/2007 0450   VLDL 20 06/11/2007 0450   LDLCALC 96 08/27/2020 1121   LDLCALC 171 (H) 08/25/2013 0810     Wt Readings from Last 3 Encounters:  12/13/20 196 lb (88.9 kg)  11/18/20 195 lb 3.2 oz (88.5 kg)  10/29/20 195 lb (88.5 kg)     Other studies Reviewed: Additional studies/ records that were reviewed today include: . Review of the above records demonstrates:    Assessment and Plan:   1. CAD without angina: Mild CAD noted on coronary CTA in 2019. She is statin intolerant and did not tolerate Praluent. No chest pain. Continue ASA.   2. HTN: BP is well controlled. No changes today  3. HLD: She has not tolerated statins or Praluent.   Current medicines are reviewed at length with the patient today.  The patient does not have concerns regarding medicines.  The following changes have been made:  no change  Labs/ tests ordered today include:   No orders of the defined types were placed in this encounter.    Disposition:   FU with me in 12 months   Signed, Lauree Chandler, MD 02/03/2021 8:51 AM    [facility] [place],  [place]  [address] Phone: [phone]; Fax: [phone]

## 2021-02-04 ENCOUNTER — Ambulatory Visit: Payer: Medicare Other | Admitting: Cardiovascular Disease

## 2021-02-04 ENCOUNTER — Ambulatory Visit: Payer: Medicare Other | Admitting: Diagnostic Neuroimaging

## 2021-02-07 ENCOUNTER — Ambulatory Visit (INDEPENDENT_AMBULATORY_CARE_PROVIDER_SITE_OTHER): Payer: Medicare Other | Admitting: Cardiovascular Disease

## 2021-02-07 ENCOUNTER — Encounter: Payer: Self-pay | Admitting: Cardiovascular Disease

## 2021-02-07 ENCOUNTER — Other Ambulatory Visit: Payer: Self-pay

## 2021-02-07 VITALS — BP 120/72 | HR 77 | Ht 64.0 in | Wt 197.6 lb

## 2021-02-07 DIAGNOSIS — I10 Essential (primary) hypertension: Secondary | ICD-10-CM

## 2021-02-07 DIAGNOSIS — I2510 Atherosclerotic heart disease of native coronary artery without angina pectoris: Secondary | ICD-10-CM | POA: Diagnosis not present

## 2021-02-07 DIAGNOSIS — E782 Mixed hyperlipidemia: Secondary | ICD-10-CM

## 2021-02-07 NOTE — Patient Instructions (Signed)
Medication Instructions:  No changes *If you need a refill on your cardiac medications before your next appointment, please call your pharmacy*   Lab Work: none   Testing/Procedures: none   Follow-Up: At [facility], you and your health needs are our priority.  As part of our continuing mission to provide you with exceptional heart care, we have created designated Provider Care Teams.  These Care Teams include your primary Cardiologist (physician) and Advanced Practice Providers (APPs -  Physician Assistants and Nurse Practitioners) who all work together to provide you with the care you need, when you need it.   Your next appointment:   12 month(s)  The format for your next appointment:   In Person  Provider:   Christopher McAlhany, MD    

## 2021-02-07 NOTE — Progress Notes (Signed)
Chief Complaint  Patient presents with   Follow-up    CAD   History of Present Illness: 71 yo female with history of HTN, HLD and CAD who is here today for cardiac follow up. She was admitted to [facility] January 2019 with chest pain.and had a coronary CTA which showed mild non-obstructive CAD. She has been statin intolerant in the past. She has also not tolerated Zetia. She was seen in the lipid clinic 02/20/17 and was started on Praluent but she did not tolerate it. She has been on red yeast rice. She failed another trial of Lipitor. Normal ABI September 2022. Echo LVEF=55-60%. No valve disease. Coronary CTA August 2022 with mild CAD.    She is here today for follow up. The patient denies any chest pain, dyspnea, palpitations, lower extremity edema, orthopnea, PND, dizziness, near syncope or syncope.   Primary Care Physician: Janora Norlander, DO  Past Medical History:  Diagnosis Date   Allergy    Allergy to alpha-gal    Anxiety    Arthritis    Asthma    CAD (coronary artery disease)    Colon polyp    COVID-19 virus infection 05/2020   Depression    Elevated CK    Food allergy    GERD (gastroesophageal reflux disease)    Hyperlipidemia    Hypertension    Mild tricuspid stenosis    Renal cell carcinoma (HCC)    Urticaria     Past Surgical History:  Procedure Laterality Date   ABDOMINAL HYSTERECTOMY     Carpel Tunnel Right    CHOLECYSTECTOMY     COLONOSCOPY  02/08/2005   [id number] hemorrhoids otherwise normal rectum/ A few scattered, shallow, left-sided sigmoid diverticula/The remainder of the colonic mucosa appeared normal   COLONOSCOPY WITH ESOPHAGOGASTRODUODENOSCOPY (EGD) N/A 11/29/2012   Procedure: COLONOSCOPY WITH ESOPHAGOGASTRODUODENOSCOPY (EGD);  Surgeon: Daneil Dolin, MD;  [facility];  Service: Endoscopy;  Laterality: N/A;  10:45   HERNIA REPAIR     ventral X 2 with mesh, last one 2011 ([place]). complicated with 2 week hospital stay   HERNIA  REPAIR     with mesh   NISSEN FUNDOPLICATION  [id number]   POLYPECTOMY     ROTATOR CUFF REPAIR Left 04/04/2019   TUBAL LIGATION      Current Outpatient Medications  Medication Sig Dispense Refill   albuterol (PROAIR HFA) 108 (90 Base) MCG/ACT inhaler Inhale 2 puffs into the lungs every 4 (four) hours as needed for wheezing or shortness of breath. 8.5 g 3   albuterol (PROVENTIL) (2.5 MG/3ML) 0.083% nebulizer solution Take 3 mLs (2.5 mg total) by nebulization every 6 (six) hours as needed for wheezing or shortness of breath. 150 mL 6   Albuterol Sulfate 2.5 MG/0.5ML NEBU Take 0.5 mLs (2.5 mg total) by nebulization every 6 (six) hours as needed. 20 mL 3   aspirin 81 MG tablet Take 81 mg by mouth daily.     calcium carbonate (TUMS) 500 MG chewable tablet Chew 1 tablet (200 mg of elemental calcium total) by mouth as needed for indigestion or heartburn.     Cholecalciferol (VITAMIN D) 2000 UNITS tablet Take 2,000 Units by mouth daily.     EPINEPHrine 0.3 mg/0.3 mL IJ SOAJ injection Inject into the muscle once.     famotidine (PEPCID) 40 MG tablet Take 1 tablet (40 mg total) by mouth daily. 90 tablet 3   fish oil-omega-3 fatty acids 1000 MG capsule Take 2 g by mouth daily.  fluticasone (FLONASE) 50 MCG/ACT nasal spray Place 2 sprays into both nostrils daily. 16 g 6   loratadine (CLARITIN) 10 MG tablet Take 10 mg by mouth 2 (two) times daily.     losartan-hydrochlorothiazide (HYZAAR) 100-12.5 MG tablet TAKE ONE TABLET BY MOUTH DAILY 90 tablet 0   Multiple Vitamins-Minerals (EMERGEN-C IMMUNE PO) Take by mouth.     pantoprazole (PROTONIX) 40 MG tablet Take 1 tablet (40 mg total) by mouth daily. For stomach 30 tablet 11   Polyvinyl Alcohol-Povidone (REFRESH OP) Apply to eye.     No current facility-administered medications for this visit.    Allergies  Allergen Reactions   Codeine Nausea And Vomiting   Levaquin [Levofloxacin] Other (See Comments)    Achilles tendon pain   Meat [Alpha-Gal]     Penicillins Other (See Comments)    Does not know   Praluent [Alirocumab]     Rash and myalgias   Strawberry (Diagnostic)     Social History   Socioeconomic History   Marital status: Divorced    Spouse name: Not on file   Number of children: 3   Years of education: 12+   Highest education level: Some college, no degree  Occupational History   Occupation: private duty Manufacturing systems engineer  Tobacco Use   Smoking status: Former    Packs/day: 0.50    Years: 28.00    Pack years: 14.00    Types: Cigarettes    Quit date: 01/17/1996    Years since quitting: 25.0   Smokeless tobacco: Never  Vaping Use   Vaping Use: Never used  Substance and Sexual Activity   Alcohol use: Yes    Alcohol/week: 2.0 standard drinks    Types: 2 Glasses of wine per week    Comment: occasional use of wine    Drug use: No   Sexual activity: Not Currently    Partners: Male    Birth control/protection: Surgical  Other Topics Concern   Not on file  Social History Narrative   Lives alone.    Social Determinants of Health   Financial Resource Strain: Low Risk    Difficulty of Paying Living Expenses: Not hard at all  Food Insecurity: No Food Insecurity   Worried About Charity fundraiser in the Last Year: Never true   [place] in the Last Year: Never true  Transportation Needs: No Transportation Needs   Lack of Transportation (Medical): No   Lack of Transportation (Non-Medical): No  Physical Activity: Insufficiently Active   Days of Exercise per Week: 7 days   Minutes of Exercise per Session: 20 min  Stress: No Stress Concern Present   Feeling of Stress : Not at all  Social Connections: Moderately Integrated   Frequency of Communication with Friends and Family: More than three times a week   Frequency of Social Gatherings with Friends and Family: More than three times a week   Attends Religious Services: More than 4 times per year   Active Member of [employer] or Organizations: Yes   Attends Programme researcher, broadcasting/film/video: More than 4 times per year   Marital Status: Divorced  Human resources officer Violence: Not At Risk   Fear of Current or Ex-Partner: No   Emotionally Abused: No   Physically Abused: No   Sexually Abused: No    Family History  Problem Relation Age of Onset   Heart disease Mother    Alzheimer's disease Mother    Diabetes Mother    Other Father  deceased age 13 from some sort of GI problems but no malignancy   Diabetes Sister    Asthma Sister    Colon polyps Sister    Leukemia Brother        CLL   Diabetes Brother    Diabetes Daughter    Myasthenia gravis Daughter    Pulmonary embolism Daughter    Healthy Son    Healthy Son    Cancer Cousin        pancreatic   Cancer Cousin        pancreatic   Colon cancer Neg Hx    Stomach cancer Neg Hx    Esophageal cancer Neg Hx    Rectal cancer Neg Hx     Review of Systems:  As stated in the HPI and otherwise negative.   BP 120/72    Pulse 77    Ht 5\' 4"  (1.626 m)    Wt 197 lb 9.6 oz (89.6 kg)    SpO2 98%    BMI 33.92 kg/m   Physical Examination: General: Well developed, well nourished, NAD  HEENT: OP clear, mucus membranes moist  SKIN: warm, dry. No rashes. Neuro: No focal deficits  Musculoskeletal: Muscle strength 5/5 all ext  Psychiatric: Mood and affect normal  Neck: No JVD, no carotid bruits, no thyromegaly, no lymphadenopathy.  Lungs:Clear bilaterally, no wheezes, rhonci, crackles Cardiovascular: Regular rate and rhythm. No murmurs, gallops or rubs. Abdomen:Soft. Bowel sounds present. Non-tender.  Extremities: No lower extremity edema. Pulses are 2 + in the bilateral DP/PT.  EKG:  EKG is not ordered today. The ekg ordered today demonstrates   Echo September 2022:  1. Left ventricular ejection fraction, by estimation, is 55 to 60%. Left  ventricular ejection fraction by 3D volume is 58 %. The left ventricle has  normal function. There is mild concentric left ventricular hypertrophy.  Left  ventricular diastolic  parameters are consistent with Grade I diastolic dysfunction (impaired  relaxation). The average left ventricular global longitudinal strain is  -21.6 %. The global longitudinal strain is normal.   2. Right ventricular systolic function is normal. The right ventricular  size is normal. Tricuspid regurgitation signal is inadequate for assessing  PA pressure.   3. The mitral valve is normal in structure. Trivial mitral valve  regurgitation. No evidence of mitral stenosis.   4. Suggest limited study with definity contrast to better   5. Mild tricuspid stenosis.   6. The aortic valve is normal in structure. Aortic valve regurgitation is  not visualized. No aortic stenosis is present.   7. The inferior vena cava is normal in size with greater than 50%  respiratory variability, suggesting right atrial pressure of 3 mmHg.   Recent Labs: 08/27/2020: ALT 27; Hemoglobin 12.9; Magnesium 2.0; Platelets 269; TSH 1.290 10/29/2020: BUN 16; Creatinine, Ser 0.96; Potassium 4.2; Sodium 141   Lipid Panel    Component Value Date/Time   CHOL 168 08/27/2020 1121   TRIG 107 08/27/2020 1121   TRIG 79 02/17/2016 0817   HDL 53 08/27/2020 1121   HDL 53 02/17/2016 0817   CHOLHDL 3.2 08/27/2020 1121   CHOLHDL 3.5 06/11/2007 0450   VLDL 20 06/11/2007 0450   LDLCALC 96 08/27/2020 1121   LDLCALC 171 (H) 08/25/2013 0810     Wt Readings from Last 3 Encounters:  02/07/21 197 lb 9.6 oz (89.6 kg)  12/13/20 196 lb (88.9 kg)  11/18/20 195 lb 3.2 oz (88.5 kg)     Other studies Reviewed: Additional studies/  records that were reviewed today include: . Review of the above records demonstrates:    Assessment and Plan:   1. CAD without angina: Mild CAD noted on coronary CTA in 2022. She is statin intolerant and did not tolerate Praluent. No chest pain. Continue ASA.   2. HTN: BP is well controlled. No changes today  3. HLD: She has not tolerated statins, Zetia or Praluent. LDL 96 in  August 2022.   Current medicines are reviewed at length with the patient today.  The patient does not have concerns regarding medicines.  The following changes have been made:  no change  Labs/ tests ordered today include:   No orders of the defined types were placed in this encounter.    Disposition:   F/U with me in 12 months   Signed, Lauree Chandler, MD 02/07/2021 10:44 AM    [facility] [address], [place], [place]  [address] Phone: [phone]; Fax: [phone]

## 2021-02-18 DIAGNOSIS — N2889 Other specified disorders of kidney and ureter: Secondary | ICD-10-CM | POA: Diagnosis not present

## 2021-03-01 DIAGNOSIS — N2889 Other specified disorders of kidney and ureter: Secondary | ICD-10-CM | POA: Diagnosis not present

## 2021-03-17 ENCOUNTER — Other Ambulatory Visit: Payer: Self-pay | Admitting: Family Medicine

## 2021-03-28 ENCOUNTER — Encounter: Payer: Self-pay | Admitting: Diagnostic Neuroimaging

## 2021-03-28 ENCOUNTER — Ambulatory Visit (INDEPENDENT_AMBULATORY_CARE_PROVIDER_SITE_OTHER): Payer: Medicare Other | Admitting: Diagnostic Neuroimaging

## 2021-03-28 ENCOUNTER — Telehealth: Payer: Self-pay | Admitting: Diagnostic Neuroimaging

## 2021-03-28 VITALS — BP 141/81 | HR 80 | Ht 64.0 in | Wt 195.0 lb

## 2021-03-28 DIAGNOSIS — G8929 Other chronic pain: Secondary | ICD-10-CM

## 2021-03-28 DIAGNOSIS — I2510 Atherosclerotic heart disease of native coronary artery without angina pectoris: Secondary | ICD-10-CM | POA: Diagnosis not present

## 2021-03-28 DIAGNOSIS — M5442 Lumbago with sciatica, left side: Secondary | ICD-10-CM

## 2021-03-28 NOTE — Progress Notes (Signed)
[facility]  PATIENT: Leah [name] DOB: August 21, 1950  REFERRING CLINICIAN: Collier Salina, MD HISTORY FROM: patient  REASON FOR VISIT: new consult    HISTORICAL  CHIEF COMPLAINT:  Chief Complaint  Patient presents with   Numbness    RM 7 alone Pt is well, has been having muscle spasms/cramps for about 2 yrs. Also Having weakness,numbness and aches mostly in L leg but occasionally both and also hands.     HISTORY OF PRESENT ILLNESS:   71 year old female here for evaluation of muscle cramps and low back pain.  Patient has had low back pain rating to the left buttock and left leg for past 1 year.  Symptoms are worse when she is sitting down for a long time such as when she is driving.  Symptoms or not improving in spite of conservative therapy.  Also having some generalized aches, myalgias, pain, with slightly elevated CK.  She has seen PCP and rheumatology.  Statin was discontinued and his symptoms slightly improved but CK remained elevated.   REVIEW OF SYSTEMS: Full 14 system review of systems performed and negative with exception of: as per HPI.  ALLERGIES: Allergies  Allergen Reactions   Codeine Nausea And Vomiting   Levaquin [Levofloxacin] Other (See Comments)    Achilles tendon pain   Meat [Alpha-Gal]    Penicillins Other (See Comments)    Does not know   Praluent [Alirocumab]     Rash and myalgias   Strawberry (Diagnostic)     HOME MEDICATIONS: Outpatient Medications Prior to Visit  Medication Sig Dispense Refill   albuterol (PROAIR HFA) 108 (90 Base) MCG/ACT inhaler Inhale 2 puffs into the lungs every 4 (four) hours as needed for wheezing or shortness of breath. 8.5 g 3   albuterol (PROVENTIL) (2.5 MG/3ML) 0.083% nebulizer solution Take 3 mLs (2.5 mg total) by nebulization every 6 (six) hours as needed for wheezing or shortness of breath. 150 mL 6   Albuterol Sulfate 2.5 MG/0.5ML NEBU Take 0.5 mLs (2.5 mg total) by nebulization every 6  (six) hours as needed. 20 mL 3   aspirin 81 MG tablet Take 81 mg by mouth daily.     calcium carbonate (TUMS) 500 MG chewable tablet Chew 1 tablet (200 mg of elemental calcium total) by mouth as needed for indigestion or heartburn.     Cholecalciferol (VITAMIN D) 2000 UNITS tablet Take 2,000 Units by mouth daily.     EPINEPHrine 0.3 mg/0.3 mL IJ SOAJ injection Inject into the muscle once.     famotidine (PEPCID) 40 MG tablet Take 1 tablet (40 mg total) by mouth daily. 90 tablet 3   fish oil-omega-3 fatty acids 1000 MG capsule Take 2 g by mouth daily.      fluticasone (FLONASE) 50 MCG/ACT nasal spray Place 2 sprays into both nostrils daily. 16 g 6   loratadine (CLARITIN) 10 MG tablet Take 10 mg by mouth 2 (two) times daily.     losartan-hydrochlorothiazide (HYZAAR) 100-12.5 MG tablet TAKE ONE TABLET BY MOUTH DAILY 90 tablet 0   Multiple Vitamins-Minerals (EMERGEN-C IMMUNE PO) Take by mouth.     pantoprazole (PROTONIX) 40 MG tablet Take 1 tablet (40 mg total) by mouth daily. For stomach 30 tablet 11   Polyvinyl Alcohol-Povidone (REFRESH OP) Apply to eye.     No facility-administered medications prior to visit.    PAST MEDICAL HISTORY: Past Medical History:  Diagnosis Date   Allergy    Allergy to alpha-gal    Anxiety  Arthritis    Asthma    CAD (coronary artery disease)    Colon polyp    COVID-19 virus infection 05/2020   Depression    Elevated CK    Food allergy    GERD (gastroesophageal reflux disease)    Hyperlipidemia    Hypertension    Mild tricuspid stenosis    Renal cell carcinoma (HCC)    Urticaria     PAST SURGICAL HISTORY: Past Surgical History:  Procedure Laterality Date   ABDOMINAL HYSTERECTOMY     Carpel Tunnel Right    CHOLECYSTECTOMY     COLONOSCOPY  02/08/2005   [id number] hemorrhoids otherwise normal rectum/ A few scattered, shallow, left-sided sigmoid diverticula/The remainder of the colonic mucosa appeared normal   COLONOSCOPY WITH  ESOPHAGOGASTRODUODENOSCOPY (EGD) N/A 11/29/2012   Procedure: COLONOSCOPY WITH ESOPHAGOGASTRODUODENOSCOPY (EGD);  Surgeon: Daneil Dolin, MD;  [facility];  Service: Endoscopy;  Laterality: N/A;  10:45   HERNIA REPAIR     ventral X 2 with mesh, last one 2011 ([place]). complicated with 2 week hospital stay   [place]     with mesh   NISSEN FUNDOPLICATION  2119   POLYPECTOMY     ROTATOR CUFF REPAIR Left 04/04/2019   TUBAL LIGATION      FAMILY HISTORY: Family History  Problem Relation Age of Onset   Heart disease Mother    Alzheimer's disease Mother    Diabetes Mother    Other Father        deceased age 31 from some sort of GI problems but no malignancy   Diabetes Sister    Asthma Sister    Colon polyps Sister    Leukemia Brother        CLL   Diabetes Brother    Diabetes Daughter    Myasthenia gravis Daughter    Pulmonary embolism Daughter    Healthy Son    Healthy Son    Cancer Cousin        pancreatic   Cancer Cousin        pancreatic   Colon cancer Neg Hx    Stomach cancer Neg Hx    Esophageal cancer Neg Hx    Rectal cancer Neg Hx     SOCIAL HISTORY: Social History   Socioeconomic History   Marital status: Divorced    Spouse name: Not on file   Number of children: 3   Years of education: 12+   Highest education level: Some college, no degree  Occupational History   Occupation: private duty Manufacturing systems engineer  Tobacco Use   Smoking status: Former    Packs/day: 0.50    Years: 28.00    Pack years: 14.00    Types: Cigarettes    Quit date: 01/17/1996    Years since quitting: 25.2   Smokeless tobacco: Never  Vaping Use   Vaping Use: Never used  Substance and Sexual Activity   Alcohol use: Yes    Alcohol/week: 2.0 standard drinks    Types: 2 Glasses of wine per week    Comment: occasional use of wine    Drug use: No   Sexual activity: Not Currently    Partners: Male    Birth control/protection: Surgical  Other Topics Concern   Not on  file  Social History Narrative   Lives alone.    Social Determinants of Health   Financial Resource Strain: Low Risk    Difficulty of Paying Living Expenses: Not hard at all  Food Insecurity: No  Food Insecurity   Worried About Charity fundraiser in the Last Year: Never true   Ran Out of Food in the Last Year: Never true  Transportation Needs: No Transportation Needs   Lack of Transportation (Medical): No   Lack of Transportation (Non-Medical): No  Physical Activity: Insufficiently Active   Days of Exercise per Week: 7 days   Minutes of Exercise per Session: 20 min  Stress: No Stress Concern Present   Feeling of Stress : Not at all  Social Connections: Moderately Integrated   Frequency of Communication with Friends and Family: More than three times a week   Frequency of Social Gatherings with Friends and Family: More than three times a week   Attends Religious Services: More than 4 times per year   Active Member of [employer] or Organizations: Yes   Attends Music therapist: More than 4 times per year   Marital Status: Divorced  Human resources officer Violence: Not At Risk   Fear of Current or Ex-Partner: No   Emotionally Abused: No   Physically Abused: No   Sexually Abused: No     PHYSICAL EXAM  GENERAL EXAM/CONSTITUTIONAL: Vitals:  Vitals:   03/28/21 1120  BP: (!) 141/81  Pulse: 80  Weight: 195 lb (88.5 kg)  Height: '5\' 4"'$  (1.626 m)   Body mass index is 33.47 kg/m. Wt Readings from Last 3 Encounters:  03/28/21 195 lb (88.5 kg)  02/07/21 197 lb 9.6 oz (89.6 kg)  12/13/20 196 lb (88.9 kg)   Patient is in no distress; well developed, nourished and groomed; neck is supple  CARDIOVASCULAR: Examination of carotid arteries is normal; no carotid bruits Regular rate and rhythm, no murmurs Examination of peripheral vascular system by observation and palpation is normal  EYES: Ophthalmoscopic exam of optic discs and posterior segments is normal; no papilledema or  hemorrhages No results found.  MUSCULOSKELETAL: Gait, strength, tone, movements noted in Neurologic exam below  NEUROLOGIC: MENTAL STATUS:  MMSE - Mini Mental State Exam 08/08/2017  Orientation to time 5  Orientation to Place 5  Registration 3  Attention/ Calculation 5  Recall 3  Language- name 2 objects 2  Language- repeat 1  Language- follow 3 step command 3  Language- read & follow direction 1  Write a sentence 1  Copy design 1  Total score 30   awake, alert, oriented to person, place and time recent and remote memory intact normal attention and concentration language fluent, comprehension intact, naming intact fund of knowledge appropriate  CRANIAL NERVE:  2nd - no papilledema on fundoscopic exam 2nd, 3rd, 4th, 6th - pupils equal and reactive to light, visual fields full to confrontation, extraocular muscles intact, no nystagmus 5th - facial sensation symmetric 7th - facial strength symmetric 8th - hearing intact 9th - palate elevates symmetrically, uvula midline 11th - shoulder shrug symmetric 12th - tongue protrusion midline  MOTOR:  normal bulk and tone, full strength in the BUE, BLE  SENSORY:  normal and symmetric to light touch, temperature, vibration  COORDINATION:  finger-nose-finger, fine finger movements normal  REFLEXES:  deep tendon reflexes present and symmetric  GAIT/STATION:  narrow based gait     DIAGNOSTIC DATA (LABS, IMAGING, TESTING) - I reviewed patient records, labs, notes, testing and imaging myself where available.  Lab Results  Component Value Date   WBC 3.2 (L) 08/27/2020   HGB 12.9 08/27/2020   HCT 37.9 08/27/2020   MCV 81 08/27/2020   PLT 269 08/27/2020  Component Value Date/Time   NA 141 10/29/2020 1054   K 4.2 10/29/2020 1054   CL 105 10/29/2020 1054   CO2 23 10/29/2020 1054   GLUCOSE 97 10/29/2020 1054   GLUCOSE 99 08/22/2018 1436   BUN 16 10/29/2020 1054   CREATININE 0.96 10/29/2020 1054   CREATININE  0.91 08/22/2018 1436   CALCIUM 9.2 10/29/2020 1054   PROT 6.3 08/27/2020 1121   ALBUMIN 4.2 08/27/2020 1121   AST 25 08/27/2020 1121   AST 20 08/22/2018 1436   ALT 27 08/27/2020 1121   ALT 26 08/22/2018 1436   ALKPHOS 97 08/27/2020 1121   BILITOT 0.4 08/27/2020 1121   BILITOT 0.4 08/22/2018 1436   GFRNONAA 61 03/10/2020 1237   GFRNONAA >60 08/22/2018 1436   GFRAA 70 03/10/2020 1237   GFRAA >60 08/22/2018 1436   Lab Results  Component Value Date   CHOL 168 08/27/2020   HDL 53 08/27/2020   LDLCALC 96 08/27/2020   TRIG 107 08/27/2020   CHOLHDL 3.2 08/27/2020   Lab Results  Component Value Date   HGBA1C 5.9 03/31/2019   Lab Results  Component Value Date   VITAMINB12 257 08/22/2018   Lab Results  Component Value Date   TSH 1.290 08/27/2020   Total CK  Date Value Ref Range Status  10/29/2020 236 (H) 32 - 182 U/L Final  09/14/2020 257 (H) 32 - 182 U/L Final  08/27/2020 276 (H) 32 - 182 U/L Final  12/18/2016 214 (H) 24 - 173 U/L Final  03/02/2016 194 (H) 24 - 173 U/L Final      ASSESSMENT AND PLAN  71 y.o. year old female here with:   Dx:  1. Chronic left-sided low back pain with left-sided sciatica       PLAN:  LOW BACK PAIN --> left leg (since 2022; tried flexeril, stretching, PT) - check MRI lumbar spine; consider another round of PT; agree with mild gradually weight loss; consider medical weight mgmt clinic  MILD ELEVATED CK / generalized myalgias (since 2018) - non-specific; no muscle weakness on exam; monitor per rheumatology; may consider EMG/NCS in future  LEFT RENAL MASS - follow up per urology  Orders Placed This Encounter  Procedures   MR Martha   Return for pending test results, pending if symptoms worsen or fail to improve.    Penni Bombard, MD 0/30/0923, 30:07 PM Certified in Neurology, Neurophysiology and Neuroimaging  [facility] [address] [place], [place] [address] [phone]

## 2021-03-28 NOTE — Patient Instructions (Addendum)
LOW BACK PAIN --> left leg (x 1 year; tried flexeril, stretching, PT) ?- check MRI lumbar spine; consider another round of PT; agree with mild gradually weight loss; consider medical weight mgmt clinic ? ?MILD ELEVATED CK / generalized myalgias ?- non-specific; no muscle weakness on exam; monitor per rheumatology; consider EMG/NCS in future ? ?LEFT RENAL MASS ?- follow up per urology ?

## 2021-03-28 NOTE — Telephone Encounter (Signed)
Medicare/trust life order sent to [facility], [facility] they will reach out to the patient to schedule.  ?

## 2021-03-30 NOTE — Telephone Encounter (Signed)
Patient left a voicemail on my phone informing me that she does not want to go to [facility] to have her scan done...  ? ?I will fax the order to [facility], they will reach out to the patient to schedule.  ?

## 2021-04-04 NOTE — Telephone Encounter (Signed)
scheduled at [facility] 3/27 1230pm  ?

## 2021-04-05 ENCOUNTER — Other Ambulatory Visit: Payer: Medicare Other

## 2021-04-07 ENCOUNTER — Ambulatory Visit: Payer: Medicare Other | Admitting: Cardiovascular Disease

## 2021-04-11 ENCOUNTER — Telehealth: Payer: Self-pay | Admitting: Family Medicine

## 2021-04-11 NOTE — Telephone Encounter (Signed)
Pt called stating that she is supposed to have an MRI done today at one of the [place] facilities at 1 PM but she doesn't know which one to go to and it is not listed in her chart. ? ?Says she was told that all of the info would be sent to her in a text message about her appt and nothing was ever sent to her. ?

## 2021-04-11 NOTE — Telephone Encounter (Signed)
Pt aware she needs to contact ordering dr ?

## 2021-04-11 NOTE — Telephone Encounter (Signed)
r/sed for 3/31 645pm at [facility]  ?

## 2021-04-15 DIAGNOSIS — M4316 Spondylolisthesis, lumbar region: Secondary | ICD-10-CM | POA: Diagnosis not present

## 2021-04-15 DIAGNOSIS — M48061 Spinal stenosis, lumbar region without neurogenic claudication: Secondary | ICD-10-CM | POA: Diagnosis not present

## 2021-04-27 ENCOUNTER — Telehealth: Payer: Self-pay

## 2021-04-27 ENCOUNTER — Telehealth: Payer: Self-pay | Admitting: Diagnostic Neuroimaging

## 2021-04-27 DIAGNOSIS — M4800 Spinal stenosis, site unspecified: Secondary | ICD-10-CM

## 2021-04-27 DIAGNOSIS — G8929 Other chronic pain: Secondary | ICD-10-CM

## 2021-04-27 NOTE — Telephone Encounter (Signed)
Referral sent to [facility] [phone] 

## 2021-04-27 NOTE — Telephone Encounter (Signed)
Dr. Domenica Fail received results from [facility] on MRI Spine Lumbar: ?Findings support Moderate Spinal Stenosis and Dr. Leta Baptist recommended Neurosurgery evaluation. ? ?I called pt and relayed results she verbalized understanding and agreement to the plan. Referral has been placed.  ?

## 2021-04-28 NOTE — Telephone Encounter (Signed)
Scheduled at [facility] for 05/09/21 at 1:00 Pm to see Dr. Christella Noa. ?

## 2021-05-04 ENCOUNTER — Ambulatory Visit (INDEPENDENT_AMBULATORY_CARE_PROVIDER_SITE_OTHER): Payer: Medicare Other | Admitting: Family Medicine

## 2021-05-04 ENCOUNTER — Encounter: Payer: Self-pay | Admitting: Family Medicine

## 2021-05-04 VITALS — BP 136/82 | HR 77 | Temp 98.9°F | Ht 64.0 in | Wt 195.6 lb

## 2021-05-04 DIAGNOSIS — R739 Hyperglycemia, unspecified: Secondary | ICD-10-CM | POA: Diagnosis not present

## 2021-05-04 DIAGNOSIS — E669 Obesity, unspecified: Secondary | ICD-10-CM

## 2021-05-04 DIAGNOSIS — N2889 Other specified disorders of kidney and ureter: Secondary | ICD-10-CM

## 2021-05-04 DIAGNOSIS — E782 Mixed hyperlipidemia: Secondary | ICD-10-CM

## 2021-05-04 DIAGNOSIS — I10 Essential (primary) hypertension: Secondary | ICD-10-CM | POA: Diagnosis not present

## 2021-05-04 LAB — BAYER DCA HB A1C WAIVED: HB A1C (BAYER DCA - WAIVED): 5.8 % — ABNORMAL HIGH (ref 4.8–5.6)

## 2021-05-04 MED ORDER — METFORMIN HCL ER 500 MG PO TB24: 500.0000 mg | ORAL_TABLET | Freq: Every day | ORAL | 0 refills | Status: DC

## 2021-05-04 NOTE — Progress Notes (Signed)
? ?Subjective: ?IN:[facility] ?PCP: Janora Norlander, DO ?NOB:SJGGEZM Leah [name] is a 71 y.o. female presenting to clinic today for: ? ?1. Obesity/ ?RCC left ?Patient has been struggling with losing weight.  She has not been successful so far.  She has to lose weight in efforts to get her renal surgery done.  She has also been having a lot of back pain.  She will be seeing a neurosurgeon for this.  Her renal mass has grown slightly since last check.  She is interested in seeking second opinion at [facility] if possible. ? ?Daily intake usually consists of: ? ?B: oatmeal, coffee, toast ?L: eats out, sometimes fried foods/ sometimes a salad ?D: varies, often does not cook, typically some type of carb/ snack ? ? ?ROS: Per HPI ? ?Allergies  ?Allergen Reactions  ? Beef Allergy Anaphylaxis  ? Pork Allergy Anaphylaxis  ? Codeine Nausea And Vomiting  ? Levaquin [Levofloxacin] Other (See Comments)  ?  Achilles tendon pain  ? Meat [Alpha-Gal]   ? Penicillins Other (See Comments)  ?  Does not know  ? Praluent [Alirocumab]   ?  Rash and myalgias  ? Strawberry (Diagnostic)   ? ?Past Medical History:  ?Diagnosis Date  ? Allergy   ? Allergy to alpha-gal   ? Anxiety   ? Arthritis   ? Asthma   ? CAD (coronary artery disease)   ? Colon polyp   ? COVID-19 virus infection 05/2020  ? Depression   ? Elevated CK   ? Food allergy   ? GERD (gastroesophageal reflux disease)   ? Hyperlipidemia   ? Hypertension   ? Mild tricuspid stenosis   ? Renal cell carcinoma ([place])   ? Urticaria   ? ? ?Current Outpatient Medications:  ?  albuterol (PROAIR HFA) 108 (90 Base) MCG/ACT inhaler, Inhale 2 puffs into the lungs every 4 (four) hours as needed for wheezing or shortness of breath., Disp: 8.5 g, Rfl: 3 ?  albuterol (PROVENTIL) (2.5 MG/3ML) 0.083% nebulizer solution, Take 3 mLs (2.5 mg total) by nebulization every 6 (six) hours as needed for wheezing or shortness of breath., Disp: 150 mL, Rfl: 6 ?  Albuterol Sulfate 2.5 MG/0.5ML NEBU, Take 0.5 mLs (2.5 mg  total) by nebulization every 6 (six) hours as needed., Disp: 20 mL, Rfl: 3 ?  aspirin 81 MG tablet, Take 81 mg by mouth daily., Disp: , Rfl:  ?  calcium carbonate (TUMS) 500 MG chewable tablet, Chew 1 tablet (200 mg of elemental calcium total) by mouth as needed for indigestion or heartburn., Disp: , Rfl:  ?  Cholecalciferol (VITAMIN D) 2000 UNITS tablet, Take 2,000 Units by mouth daily., Disp: , Rfl:  ?  EPINEPHrine 0.3 mg/0.3 mL IJ SOAJ injection, Inject into the muscle once., Disp: , Rfl:  ?  famotidine (PEPCID) 40 MG tablet, Take 1 tablet (40 mg total) by mouth daily., Disp: 90 tablet, Rfl: 3 ?  fish oil-omega-3 fatty acids 1000 MG capsule, Take 2 g by mouth daily. , Disp: , Rfl:  ?  fluticasone (FLONASE) 50 MCG/ACT nasal spray, Place 2 sprays into both nostrils daily., Disp: 16 g, Rfl: 6 ?  loratadine (CLARITIN) 10 MG tablet, Take 10 mg by mouth 2 (two) times daily., Disp: , Rfl:  ?  losartan-hydrochlorothiazide (HYZAAR) 100-12.5 MG tablet, TAKE ONE TABLET BY MOUTH DAILY, Disp: 90 tablet, Rfl: 0 ?  Multiple Vitamins-Minerals (EMERGEN-C IMMUNE PO), Take by mouth., Disp: , Rfl:  ?  pantoprazole (PROTONIX) 40 MG tablet, Take 1  tablet (40 mg total) by mouth daily. For stomach, Disp: 30 tablet, Rfl: 11 ?  Polyvinyl Alcohol-Povidone (REFRESH OP), Apply to eye., Disp: , Rfl:  ?Social History  ? ?Socioeconomic History  ? Marital status: Divorced  ?  Spouse name: Not on file  ? Number of children: 3  ? Years of education: 12+  ? Highest education level: Some college, no degree  ?Occupational History  ? Occupation: private duty Manufacturing systems engineer  ?Tobacco Use  ? Smoking status: Former  ?  Packs/day: 0.50  ?  Years: 28.00  ?  Pack years: 14.00  ?  Types: Cigarettes  ?  Quit date: 01/17/1996  ?  Years since quitting: 25.3  ? Smokeless tobacco: Never  ?Vaping Use  ? Vaping Use: Never used  ?Substance and Sexual Activity  ? Alcohol use: Yes  ?  Alcohol/week: 2.0 standard drinks  ?  Types: 2 Glasses of wine per week  ?  Comment:  occasional use of wine   ? Drug use: No  ? Sexual activity: Not Currently  ?  Partners: Male  ?  Birth control/protection: Surgical  ?Other Topics Concern  ? Not on file  ?Social History Narrative  ? Lives alone.   ? ?Social Determinants of Health  ? ?Financial Resource Strain: Low Risk   ? Difficulty of Paying Living Expenses: Not hard at all  ?Food Insecurity: No Food Insecurity  ? Worried About Charity fundraiser in the Last Year: Never true  ? Ran Out of Food in the Last Year: Never true  ?Transportation Needs: No Transportation Needs  ? Lack of Transportation (Medical): No  ? Lack of Transportation (Non-Medical): No  ?Physical Activity: Insufficiently Active  ? Days of Exercise per Week: 7 days  ? Minutes of Exercise per Session: 20 min  ?Stress: No Stress Concern Present  ? Feeling of Stress : Not at all  ?Social Connections: Moderately Integrated  ? Frequency of Communication with Friends and Family: More than three times a week  ? Frequency of Social Gatherings with Friends and Family: More than three times a week  ? Attends Religious Services: More than 4 times per year  ? Active Member of Clubs or Organizations: Yes  ? Attends Archivist Meetings: More than 4 times per year  ? Marital Status: Divorced  ?Intimate Partner Violence: Not At Risk  ? Fear of Current or Ex-Partner: No  ? Emotionally Abused: No  ? Physically Abused: No  ? Sexually Abused: No  ? ?Family History  ?Problem Relation Age of Onset  ? Heart disease Mother   ? Alzheimer's disease Mother   ? Diabetes Mother   ? Other Father   ?     deceased age 76 from some sort of GI problems but no malignancy  ? Diabetes Sister   ? Asthma Sister   ? Colon polyps Sister   ? Leukemia Brother   ?     CLL  ? Diabetes Brother   ? Diabetes Daughter   ? Myasthenia gravis Daughter   ? Pulmonary embolism Daughter   ? Healthy Son   ? Healthy Son   ? Cancer Cousin   ?     pancreatic  ? Cancer Cousin   ?     pancreatic  ? Colon cancer Neg Hx   ? Stomach  cancer Neg Hx   ? Esophageal cancer Neg Hx   ? Rectal cancer Neg Hx   ? ? ?Objective: ?Office vital signs reviewed. ?  BP 136/82   Pulse 77   Temp 98.9 ?F (37.2 ?C)   Ht $R'5\' 4"'WT$  (1.626 m)   Wt 195 lb 9.6 oz (88.7 kg)   SpO2 96%   BMI 33.57 kg/m?  ? ?Physical Examination:  ?General: Awake, alert, well nourished, obese. No acute distress ?HEENT: sclera white, MMM ?Cardio: regular rate and rhythm, S1S2 heard, no murmurs appreciated ?Pulm: clear to auscultation bilaterally, no wheezes, rhonchi or rales; normal work of breathing on room air ?MSK: minimally antalgic gait. Normal station. Ambulating independently ? ?Lab Results  ?Component Value Date  ? HGBA1C 5.8 (H) 05/04/2021  ? ?Assessment/ Plan: ?71 y.o. female  ? ?Left renal mass - Plan: CBC with Differential, Ambulatory referral to Urology ? ?Essential hypertension - Plan: CMP14+EGFR ? ?Mixed hyperlipidemia - Plan: Lipid Panel, CMP14+EGFR, TSH ? ?Obesity (BMI 30.0-34.9) - Plan: Bayer DCA Hb A1c Waived, Lipid Panel, CMP14+EGFR, TSH, metFORMIN (GLUCOPHAGE-XR) 500 MG 24 hr tablet ? ?Referral to [facility] placed for second opinion. Sounds like she's been experiencing some pain in the region of her mass on the left. ? ?BP well controlled. No changes. Check renal function ? ?Check fasting lipid, LFTs, TSH ? ?We discussed mediterranean diet, handout and websites given.  Metformin added in hopes we can improve insulin resistance and promote weight loss.  A1c did NOT demonstrate DM but DOES demonstrate PREdiabetes. ? ?Follow up in 1 month for weight recheck. ? ?Orders Placed This Encounter  ?Procedures  ? Bayer DCA Hb A1c Waived  ? Lipid Panel  ? CMP14+EGFR  ? TSH  ? CBC with Differential  ? Ambulatory referral to Urology  ?  Referral Priority:   Routine  ?  Referral Type:   Consultation  ?  Referral Reason:   Specialty Services Required  ?  Requested Specialty:   Urology  ?  Number of Visits Requested:   1  ? ?No orders of the defined types were placed in this  encounter. ? ? ? ?Janora Norlander, DO ?[place] ?[phone] ? ? ? ? ? ? ? ?

## 2021-05-04 NOTE — Patient Instructions (Addendum)
You had labs performed today.  You will be contacted with the results of the labs once they are available, usually in the next 3 business days for routine lab work.  If you have an active my chart account, they will be released to your MyChart.  If you prefer to have these labs released to you via telephone, please let us know. ? ? ? [email or web address] ? ?[email or web address] ? ?

## 2021-05-05 LAB — CBC WITH DIFFERENTIAL/PLATELET
Basophils Absolute: 0 10*3/uL (ref 0.0–0.2)
Basos: 1 %
EOS (ABSOLUTE): 0.1 10*3/uL (ref 0.0–0.4)
Eos: 2 %
Hematocrit: 40.1 % (ref 34.0–46.6)
Hemoglobin: 13.1 g/dL (ref 11.1–15.9)
Immature Grans (Abs): 0 10*3/uL (ref 0.0–0.1)
Immature Granulocytes: 0 %
Lymphocytes Absolute: 1.4 10*3/uL (ref 0.7–3.1)
Lymphs: 39 %
MCH: 26.8 pg (ref 26.6–33.0)
MCHC: 32.7 g/dL (ref 31.5–35.7)
MCV: 82 fL (ref 79–97)
Monocytes Absolute: 0.3 10*3/uL (ref 0.1–0.9)
Monocytes: 9 %
Neutrophils Absolute: 1.7 10*3/uL (ref 1.4–7.0)
Neutrophils: 49 %
Platelets: 278 10*3/uL (ref 150–450)
RBC: 4.88 x10E6/uL (ref 3.77–5.28)
RDW: 13.7 % (ref 11.7–15.4)
WBC: 3.5 10*3/uL (ref 3.4–10.8)

## 2021-05-05 LAB — CMP14+EGFR
ALT: 13 IU/L (ref 0–32)
AST: 15 IU/L (ref 0–40)
Albumin/Globulin Ratio: 2 (ref 1.2–2.2)
Albumin: 4 g/dL (ref 3.8–4.8)
Alkaline Phosphatase: 96 IU/L (ref 44–121)
BUN/Creatinine Ratio: 15 (ref 12–28)
BUN: 15 mg/dL (ref 8–27)
Bilirubin Total: 0.3 mg/dL (ref 0.0–1.2)
CO2: 26 mmol/L (ref 20–29)
Calcium: 9.4 mg/dL (ref 8.7–10.3)
Chloride: 105 mmol/L (ref 96–106)
Creatinine, Ser: 0.97 mg/dL (ref 0.57–1.00)
Globulin, Total: 2 g/dL (ref 1.5–4.5)
Glucose: 92 mg/dL (ref 70–99)
Potassium: 4.5 mmol/L (ref 3.5–5.2)
Sodium: 145 mmol/L — ABNORMAL HIGH (ref 134–144)
Total Protein: 6 g/dL (ref 6.0–8.5)
eGFR: 63 mL/min/{1.73_m2} (ref >59)

## 2021-05-05 LAB — LIPID PANEL
Chol/HDL Ratio: 5.2 ratio — ABNORMAL HIGH (ref 0.0–4.4)
Cholesterol, Total: 278 mg/dL — ABNORMAL HIGH (ref 100–199)
HDL: 53 mg/dL (ref >39)
LDL Chol Calc (NIH): 198 mg/dL — ABNORMAL HIGH (ref 0–99)
Triglycerides: 149 mg/dL (ref 0–149)
VLDL Cholesterol Cal: 27 mg/dL (ref 5–40)

## 2021-05-05 LAB — TSH: TSH: 2.02 u[IU]/mL (ref 0.450–4.500)

## 2021-05-09 ENCOUNTER — Other Ambulatory Visit: Payer: Self-pay | Admitting: Family Medicine

## 2021-05-09 DIAGNOSIS — E782 Mixed hyperlipidemia: Secondary | ICD-10-CM

## 2021-05-09 MED ORDER — ROSUVASTATIN CALCIUM 10 MG PO TABS: 10.0000 mg | ORAL_TABLET | Freq: Every day | ORAL | 3 refills | Status: DC

## 2021-05-24 DIAGNOSIS — M4316 Spondylolisthesis, lumbar region: Secondary | ICD-10-CM | POA: Diagnosis not present

## 2021-05-24 DIAGNOSIS — Z6832 Body mass index (BMI) 32.0-32.9, adult: Secondary | ICD-10-CM | POA: Diagnosis not present

## 2021-05-25 ENCOUNTER — Telehealth: Payer: Self-pay | Admitting: Family Medicine

## 2021-05-25 NOTE — Telephone Encounter (Signed)
Patient calling about her referral put in on 4-19 for Urologist @ [facility]. Patient states the referral was never sent and she has the F# [phone]. Please call patient. ?

## 2021-05-27 NOTE — Telephone Encounter (Signed)
Pt calling back this am about this referral. Please call back with any updates. ?

## 2021-05-31 ENCOUNTER — Encounter: Payer: Self-pay | Admitting: Family Medicine

## 2021-05-31 ENCOUNTER — Ambulatory Visit (INDEPENDENT_AMBULATORY_CARE_PROVIDER_SITE_OTHER): Payer: Medicare Other | Admitting: Family Medicine

## 2021-05-31 VITALS — BP 133/87 | HR 74 | Temp 98.2°F | Ht 64.0 in | Wt 185.8 lb

## 2021-05-31 DIAGNOSIS — M791 Myalgia, unspecified site: Secondary | ICD-10-CM

## 2021-05-31 DIAGNOSIS — I10 Essential (primary) hypertension: Secondary | ICD-10-CM | POA: Diagnosis not present

## 2021-05-31 DIAGNOSIS — T466X5A Adverse effect of antihyperlipidemic and antiarteriosclerotic drugs, initial encounter: Secondary | ICD-10-CM

## 2021-05-31 DIAGNOSIS — E669 Obesity, unspecified: Secondary | ICD-10-CM

## 2021-05-31 DIAGNOSIS — I2510 Atherosclerotic heart disease of native coronary artery without angina pectoris: Secondary | ICD-10-CM

## 2021-05-31 NOTE — Progress Notes (Signed)
? ?Subjective: ?CC: Weight recheck ?PCP: Janora Norlander, DO ?Leah [name] is a 71 y.o. female presenting to clinic today for: ? ?1.  Weight recheck ?Patient here for interval weight checkup.  At last visit she had voiced concerns over her weight, metabolic abnormalities including elevation in cholesterol and sugar.  She has really gotten strict about reducing carbohydrates, increasing physical activity.  She has been closely following a Mediterranean diet and has had about a 10 pound successful weight loss.  She feels very good and notes that she never feels hungry on this diet.  Her typical breakfast consists of a fruit and vegetable smoothie and or oatmeal added.  She continues to have some left-sided back pain and is awaiting an appointment with [facility] but has a phone number today so she will reach out to them after this appointment.  Since our last visit her grandchild from [place] has moved and.  They are actively working on lifestyle modification together. ? ? ?ROS: Per HPI ? ?Allergies  ?Allergen Reactions  ? Beef Allergy Anaphylaxis  ? Pork Allergy Anaphylaxis  ? Codeine Nausea And Vomiting  ? Levaquin [Levofloxacin] Other (See Comments)  ?  Achilles tendon pain  ? Meat [Alpha-Gal]   ? Penicillins Other (See Comments)  ?  Does not know  ? Praluent [Alirocumab]   ?  Rash and myalgias  ? Strawberry (Diagnostic)   ? ?Past Medical History:  ?Diagnosis Date  ? Allergy   ? Allergy to alpha-gal   ? Anxiety   ? Arthritis   ? Asthma   ? CAD (coronary artery disease)   ? Colon polyp   ? COVID-19 virus infection 05/2020  ? Depression   ? Elevated CK   ? Food allergy   ? GERD (gastroesophageal reflux disease)   ? Hyperlipidemia   ? Hypertension   ? Mild tricuspid stenosis   ? Renal cell carcinoma ([place])   ? Urticaria   ? ? ?Current Outpatient Medications:  ?  albuterol (PROAIR HFA) 108 (90 Base) MCG/ACT inhaler, Inhale 2 puffs into the lungs every 4 (four) hours as needed for wheezing or shortness of  breath., Disp: 8.5 g, Rfl: 3 ?  albuterol (PROVENTIL) (2.5 MG/3ML) 0.083% nebulizer solution, Take 3 mLs (2.5 mg total) by nebulization every 6 (six) hours as needed for wheezing or shortness of breath., Disp: 150 mL, Rfl: 6 ?  Albuterol Sulfate 2.5 MG/0.5ML NEBU, Take 0.5 mLs (2.5 mg total) by nebulization every 6 (six) hours as needed., Disp: 20 mL, Rfl: 3 ?  aspirin 81 MG tablet, Take 81 mg by mouth daily., Disp: , Rfl:  ?  calcium carbonate (TUMS) 500 MG chewable tablet, Chew 1 tablet (200 mg of elemental calcium total) by mouth as needed for indigestion or heartburn., Disp: , Rfl:  ?  Cholecalciferol (VITAMIN D) 2000 UNITS tablet, Take 2,000 Units by mouth daily., Disp: , Rfl:  ?  EPINEPHrine 0.3 mg/0.3 mL IJ SOAJ injection, Inject into the muscle once., Disp: , Rfl:  ?  famotidine (PEPCID) 40 MG tablet, Take 1 tablet (40 mg total) by mouth daily., Disp: 90 tablet, Rfl: 3 ?  fish oil-omega-3 fatty acids 1000 MG capsule, Take 2 g by mouth daily. , Disp: , Rfl:  ?  fluticasone (FLONASE) 50 MCG/ACT nasal spray, Place 2 sprays into both nostrils daily., Disp: 16 g, Rfl: 6 ?  loratadine (CLARITIN) 10 MG tablet, Take 10 mg by mouth 2 (two) times daily., Disp: , Rfl:  ?  losartan-hydrochlorothiazide (  HYZAAR) 100-12.5 MG tablet, TAKE ONE TABLET BY MOUTH DAILY, Disp: 90 tablet, Rfl: 0 ?  metFORMIN (GLUCOPHAGE-XR) 500 MG 24 hr tablet, Take 1 tablet (500 mg total) by mouth daily with breakfast., Disp: 90 tablet, Rfl: 0 ?  Multiple Vitamins-Minerals (EMERGEN-C IMMUNE PO), Take by mouth., Disp: , Rfl:  ?  pantoprazole (PROTONIX) 40 MG tablet, Take 1 tablet (40 mg total) by mouth daily. For stomach, Disp: 30 tablet, Rfl: 11 ?  Polyvinyl Alcohol-Povidone (REFRESH OP), Apply to eye., Disp: , Rfl:  ?  rosuvastatin (CRESTOR) 10 MG tablet, Take 1 tablet (10 mg total) by mouth daily., Disp: 90 tablet, Rfl: 3 ?Social History  ? ?Socioeconomic History  ? Marital status: Divorced  ?  Spouse name: Not on file  ? Number of children: 3   ? Years of education: 12+  ? Highest education level: Some college, no degree  ?Occupational History  ? Occupation: private duty Manufacturing systems engineer  ?Tobacco Use  ? Smoking status: Former  ?  Packs/day: 0.50  ?  Years: 28.00  ?  Pack years: 14.00  ?  Types: Cigarettes  ?  Quit date: 01/17/1996  ?  Years since quitting: 25.3  ? Smokeless tobacco: Never  ?Vaping Use  ? Vaping Use: Never used  ?Substance and Sexual Activity  ? Alcohol use: Yes  ?  Alcohol/week: 2.0 standard drinks  ?  Types: 2 Glasses of wine per week  ?  Comment: occasional use of wine   ? Drug use: No  ? Sexual activity: Not Currently  ?  Partners: Male  ?  Birth control/protection: Surgical  ?Other Topics Concern  ? Not on file  ?Social History Narrative  ? Lives alone.   ? ?Social Determinants of Health  ? ?Financial Resource Strain: Low Risk   ? Difficulty of Paying Living Expenses: Not hard at all  ?Food Insecurity: No Food Insecurity  ? Worried About Charity fundraiser in the Last Year: Never true  ? Ran Out of Food in the Last Year: Never true  ?Transportation Needs: No Transportation Needs  ? Lack of Transportation (Medical): No  ? Lack of Transportation (Non-Medical): No  ?Physical Activity: Insufficiently Active  ? Days of Exercise per Week: 7 days  ? Minutes of Exercise per Session: 20 min  ?Stress: No Stress Concern Present  ? Feeling of Stress : Not at all  ?Social Connections: Moderately Integrated  ? Frequency of Communication with Friends and Family: More than three times a week  ? Frequency of Social Gatherings with Friends and Family: More than three times a week  ? Attends Religious Services: More than 4 times per year  ? Active Member of Clubs or Organizations: Yes  ? Attends Archivist Meetings: More than 4 times per year  ? Marital Status: Divorced  ?Intimate Partner Violence: Not At Risk  ? Fear of Current or Ex-Partner: No  ? Emotionally Abused: No  ? Physically Abused: No  ? Sexually Abused: No  ? ?Family History   ?Problem Relation Age of Onset  ? Heart disease Mother   ? Alzheimer's disease Mother   ? Diabetes Mother   ? Other Father   ?     deceased age 50 from some sort of GI problems but no malignancy  ? Diabetes Sister   ? Asthma Sister   ? Colon polyps Sister   ? Leukemia Brother   ?     CLL  ? Diabetes Brother   ? Diabetes  Daughter   ? Myasthenia gravis Daughter   ? Pulmonary embolism Daughter   ? Healthy Son   ? Healthy Son   ? Cancer Cousin   ?     pancreatic  ? Cancer Cousin   ?     pancreatic  ? Colon cancer Neg Hx   ? Stomach cancer Neg Hx   ? Esophageal cancer Neg Hx   ? Rectal cancer Neg Hx   ? ? ?Objective: ?Office vital signs reviewed. ?BP 133/87   Pulse 74   Temp 98.2 ?F (36.8 ?C)   Ht '5\' 4"'$  (1.626 m)   Wt 185 lb 12.8 oz (84.3 kg)   SpO2 98%   BMI 31.89 kg/m?  ? ?Physical Examination:  ?General: Awake, alert, well nourished, well-appearing female, No acute distress ?HEENT: Sclera white ?Cardio: regular rate and rhythm  ?Pulm: Normal work of breathing on room air.  No wheezes ?Extremities: No edema ? ?Assessment/ Plan: ?71 y.o. female  ? ?Obesity (BMI 30.0-34.9) - Plan: Bayer DCA Hb A1c Waived, Lipid panel, Basic metabolic panel ? ?Myalgia due to statin - Plan: Lipid panel, Basic metabolic panel ? ?Essential hypertension - Plan: Lipid panel, Basic metabolic panel ? ?Coronary artery disease involving native coronary artery of native heart without angina pectoris - Plan: Lipid panel, Basic metabolic panel ? ?Doing amazing with lifestyle modification.  I congratulated her on this.  Continue Mediterranean diet, activity as tolerated.  We will get her labs rechecked in a few months.  Advised her to come in fasting. ? ?Blood pressure is well controlled.  No changes ? ?Having some myalgia due to statin.  With known CAD I think that she is to stay on some type of statin but she may consider every other day dosing to alleviate some of the discomfort.  Sounds like she is trying to muscle through it at this time  but we discussed that I think an every other day would be okay if needed ? ? ?No orders of the defined types were placed in this encounter. ? ?No orders of the defined types were placed in this encounter. ? ? ? ?Leah [name]

## 2021-06-06 NOTE — Telephone Encounter (Signed)
Pt says that she spoke with Maddie at [facility] Urology office and was asked to call our office and give referral coordinator this fax number to fax over referral [phone] and to speak with Maddie please call [phone]. Any updates, please call patient with what is going on.  Please call home or cell

## 2021-06-16 ENCOUNTER — Other Ambulatory Visit: Payer: Self-pay | Admitting: Family Medicine

## 2021-07-20 DIAGNOSIS — N2889 Other specified disorders of kidney and ureter: Secondary | ICD-10-CM | POA: Diagnosis not present

## 2021-07-27 ENCOUNTER — Ambulatory Visit (INDEPENDENT_AMBULATORY_CARE_PROVIDER_SITE_OTHER): Payer: Medicare Other

## 2021-07-27 VITALS — Wt 185.0 lb

## 2021-07-27 DIAGNOSIS — Z599 Problem related to housing and economic circumstances, unspecified: Secondary | ICD-10-CM

## 2021-07-27 DIAGNOSIS — Z0000 Encounter for general adult medical examination without abnormal findings: Secondary | ICD-10-CM | POA: Diagnosis not present

## 2021-07-27 DIAGNOSIS — Z5941 Food insecurity: Secondary | ICD-10-CM

## 2021-07-27 NOTE — Patient Instructions (Signed)
Ms. Debnam , Thank you for taking time to come for your Medicare Wellness Visit. I appreciate your ongoing commitment to your health goals. Please review the following plan we discussed and let me know if I can assist you in the future.   Screening recommendations/referrals: Colonoscopy: Done 10/09/2017 - Repeat in 5 years  Mammogram: Done 02/03/2021 - Repeat annually  Bone Density: Done 06/13/2017 - Repeat in 5 years  Recommended yearly ophthalmology/optometry visit for glaucoma screening and checkup Recommended yearly dental visit for hygiene and checkup  Vaccinations: Influenza vaccine: Done 10/29/2020 - Repeat annually  Pneumococcal vaccine: Done 12/18/2018 & 04/05/2020   Tdap vaccine: Done 02/16/2016 - Repeat in 10 years  Shingles vaccine: Due - Shingrix is 2 doses 2-6 months apart and over 90% effective     Covid-19: Done 02/21/2019, 03/22/2019, 11/25/2019, & 11/09/2020        Advanced directives: Advance directive discussed with you today. Even though you declined this today, please call our office should you change your mind, and we can give you the proper paperwork for you to fill out.   Conditions/risks identified: Aim for 30 minutes of exercise or brisk walking, 6-8 glasses of water, and 5 servings of fruits and vegetables each day.   Next appointment: Follow up in one year for your annual wellness visit    Preventive Care 65 Years and Older, Female Preventive care refers to lifestyle choices and visits with your health care provider that can promote health and wellness. What does preventive care include? A yearly physical exam. This is also called an annual well check. Dental exams once or twice a year. Routine eye exams. Ask your health care provider how often you should have your eyes checked. Personal lifestyle choices, including: Daily care of your teeth and gums. Regular physical activity. Eating a healthy diet. Avoiding tobacco and drug use. Limiting alcohol use. Practicing  safe sex. Taking low-dose aspirin every day. Taking vitamin and mineral supplements as recommended by your health care provider. What happens during an annual well check? The services and screenings done by your health care provider during your annual well check will depend on your age, overall health, lifestyle risk factors, and family history of disease. Counseling  Your health care provider may ask you questions about your: Alcohol use. Tobacco use. Drug use. Emotional well-being. Home and relationship well-being. Sexual activity. Eating habits. History of falls. Memory and ability to understand (cognition). Work and work Statistician. Reproductive health. Screening  You may have the following tests or measurements: Height, weight, and BMI. Blood pressure. Lipid and cholesterol levels. These may be checked every 5 years, or more frequently if you are over 69 years old. Skin check. Lung cancer screening. You may have this screening every year starting at age 104 if you have a 30-pack-year history of smoking and currently smoke or have quit within the past 15 years. Fecal occult blood test (FOBT) of the stool. You may have this test every year starting at age 48. Flexible sigmoidoscopy or colonoscopy. You may have a sigmoidoscopy every 5 years or a colonoscopy every 10 years starting at age 67. Hepatitis C blood test. Hepatitis B blood test. Sexually transmitted disease (STD) testing. Diabetes screening. This is done by checking your blood sugar (glucose) after you have not eaten for a while (fasting). You may have this done every 1-3 years. Bone density scan. This is done to screen for osteoporosis. You may have this done starting at age 61. Mammogram. This may be  done every 1-2 years. Talk to your health care provider about how often you should have regular mammograms. Talk with your health care provider about your test results, treatment options, and if necessary, the need for more  tests. Vaccines  Your health care provider may recommend certain vaccines, such as: Influenza vaccine. This is recommended every year. Tetanus, diphtheria, and acellular pertussis (Tdap, Td) vaccine. You may need a Td booster every 10 years. Zoster vaccine. You may need this after age 71. Pneumococcal 13-valent conjugate (PCV13) vaccine. One dose is recommended after age 43. Pneumococcal polysaccharide (PPSV23) vaccine. One dose is recommended after age 44. Talk to your health care provider about which screenings and vaccines you need and how often you need them. This information is not intended to replace advice given to you by your health care provider. Make sure you discuss any questions you have with your health care provider. Document Released: 01/29/2015 Document Revised: 09/22/2015 Document Reviewed: 11/03/2014 Elsevier Interactive Patient Education  2017 [place] Prevention in the Home Falls can cause injuries. They can happen to people of all ages. There are many things you can do to make your home safe and to help prevent falls. What can I do on the outside of my home? Regularly fix the edges of walkways and driveways and fix any cracks. Remove anything that might make you trip as you walk through a door, such as a raised step or threshold. Trim any bushes or trees on the path to your home. Use bright outdoor lighting. Clear any walking paths of anything that might make someone trip, such as rocks or tools. Regularly check to see if handrails are loose or broken. Make sure that both sides of any steps have handrails. Any raised decks and porches should have guardrails on the edges. Have any leaves, snow, or ice cleared regularly. Use sand or salt on walking paths during winter. Clean up any spills in your garage right away. This includes oil or grease spills. What can I do in the bathroom? Use night lights. Install grab bars by the toilet and in the tub and shower.  Do not use towel bars as grab bars. Use non-skid mats or decals in the tub or shower. If you need to sit down in the shower, use a plastic, non-slip stool. Keep the floor dry. Clean up any water that spills on the floor as soon as it happens. Remove soap buildup in the tub or shower regularly. Attach bath mats securely with double-sided non-slip rug tape. Do not have throw rugs and other things on the floor that can make you trip. What can I do in the bedroom? Use night lights. Make sure that you have a light by your bed that is easy to reach. Do not use any sheets or blankets that are too big for your bed. They should not hang down onto the floor. Have a firm chair that has side arms. You can use this for support while you get dressed. Do not have throw rugs and other things on the floor that can make you trip. What can I do in the kitchen? Clean up any spills right away. Avoid walking on wet floors. Keep items that you use a lot in easy-to-reach places. If you need to reach something above you, use a strong step stool that has a grab bar. Keep electrical cords out of the way. Do not use floor polish or wax that makes floors slippery. If you must use wax,  use non-skid floor wax. Do not have throw rugs and other things on the floor that can make you trip. What can I do with my stairs? Do not leave any items on the stairs. Make sure that there are handrails on both sides of the stairs and use them. Fix handrails that are broken or loose. Make sure that handrails are as long as the stairways. Check any carpeting to make sure that it is firmly attached to the stairs. Fix any carpet that is loose or worn. Avoid having throw rugs at the top or bottom of the stairs. If you do have throw rugs, attach them to the floor with carpet tape. Make sure that you have a light switch at the top of the stairs and the bottom of the stairs. If you do not have them, ask someone to add them for you. What else  can I do to help prevent falls? Wear shoes that: Do not have high heels. Have rubber bottoms. Are comfortable and fit you well. Are closed at the toe. Do not wear sandals. If you use a stepladder: Make sure that it is fully opened. Do not climb a closed stepladder. Make sure that both sides of the stepladder are locked into place. Ask someone to hold it for you, if possible. Clearly mark and make sure that you can see: Any grab bars or handrails. First and last steps. Where the edge of each step is. Use tools that help you move around (mobility aids) if they are needed. These include: Canes. Walkers. Scooters. Crutches. Turn on the lights when you go into a dark area. Replace any light bulbs as soon as they burn out. Set up your furniture so you have a clear path. Avoid moving your furniture around. If any of your floors are uneven, fix them. If there are any pets around you, be aware of where they are. Review your medicines with your doctor. Some medicines can make you feel dizzy. This can increase your chance of falling. Ask your doctor what other things that you can do to help prevent falls. This information is not intended to replace advice given to you by your health care provider. Make sure you discuss any questions you have with your health care provider. Document Released: 10/29/2008 Document Revised: 06/10/2015 Document Reviewed: 02/06/2014 Elsevier Interactive Patient Education  2017 [employer].

## 2021-07-27 NOTE — Progress Notes (Signed)
Subjective:   Leah [name] is a 71 y.o. female who presents for Medicare Annual (Subsequent) preventive examination.  Virtual Visit via Telephone Note  I connected with  Leah [name] on 07/27/21 at  3:30 PM EDT by telephone and verified that I am speaking with the correct person using two identifiers.  Location: Patient: Home Provider: [facility] Persons participating in the virtual visit: patient/Nurse Health Advisor   I discussed the limitations, risks, security and privacy concerns of performing an evaluation and management service by telephone and the availability of in person appointments. The patient expressed understanding and agreed to proceed.  Interactive audio and video telecommunications were attempted between this nurse and patient, however failed, due to patient having technical difficulties OR patient did not have access to video capability.  We continued and completed visit with audio only.  Some vital signs may be absent or patient reported.   Landrey Mahurin E Merit Gadsby, LPN   Review of Systems     Cardiac Risk Factors include: advanced age (>30mn, >>68women);dyslipidemia;hypertension;obesity (BMI >30kg/m2);Other (see comment), Risk factor comments: CAD, asthma     Objective:    Today's Vitals   07/27/21 1531  Weight: 185 lb (83.9 kg)   Body mass index is 31.76 kg/m.     07/27/2021    3:39 PM 07/26/2020    4:11 PM 03/26/2019    9:42 AM 08/08/2017    4:32 PM 01/17/2017   11:52 PM 01/17/2017    7:51 AM 11/29/2012    9:36 AM  Advanced Directives  Does Patient Have a Medical Advance Directive? No No No No  No Patient does not have advance directive;Patient would not like information  Does patient want to make changes to medical advance directive?    Yes (MAU/Ambulatory/Procedural Areas - Information given)     Would patient like information on creating a medical advance directive? No - Patient declined No - Patient declined No - Patient declined  No - Patient declined     Pre-existing out of facility DNR order (yellow form or pink MOST form)       No    Current Medications (verified) Outpatient Encounter Medications as of 07/27/2021  Medication Sig   albuterol (PROAIR HFA) 108 (90 Base) MCG/ACT inhaler Inhale 2 puffs into the lungs every 4 (four) hours as needed for wheezing or shortness of breath.   Albuterol Sulfate 2.5 MG/0.5ML NEBU Take 0.5 mLs (2.5 mg total) by nebulization every 6 (six) hours as needed.   aspirin 81 MG tablet Take 81 mg by mouth daily.   calcium carbonate (TUMS) 500 MG chewable tablet Chew 1 tablet (200 mg of elemental calcium total) by mouth as needed for indigestion or heartburn.   Cholecalciferol (VITAMIN D) 2000 UNITS tablet Take 2,000 Units by mouth daily.   famotidine (PEPCID) 40 MG tablet TAKE ONE TABLET BY MOUTH DAILY   fish oil-omega-3 fatty acids 1000 MG capsule Take 2 g by mouth daily.    fluticasone (FLONASE) 50 MCG/ACT nasal spray Place 2 sprays into both nostrils daily.   loratadine (CLARITIN) 10 MG tablet Take 10 mg by mouth 2 (two) times daily.   losartan-hydrochlorothiazide (HYZAAR) 100-12.5 MG tablet TAKE ONE TABLET BY MOUTH DAILY   metFORMIN (GLUCOPHAGE-XR) 500 MG 24 hr tablet Take 1 tablet (500 mg total) by mouth daily with breakfast.   Multiple Vitamins-Minerals (EMERGEN-C IMMUNE PO) Take by mouth.   pantoprazole (PROTONIX) 40 MG tablet Take 1 tablet (40 mg total) by mouth daily. For stomach  Polyvinyl Alcohol-Povidone (REFRESH OP) Apply to eye.   rosuvastatin (CRESTOR) 10 MG tablet Take 1 tablet (10 mg total) by mouth daily.   [DISCONTINUED] albuterol (PROVENTIL) (2.5 MG/3ML) 0.083% nebulizer solution Take 3 mLs (2.5 mg total) by nebulization every 6 (six) hours as needed for wheezing or shortness of breath.   EPINEPHrine 0.3 mg/0.3 mL IJ SOAJ injection Inject into the muscle once.   No facility-administered encounter medications on file as of 07/27/2021.    Allergies (verified) Beef allergy, Pork allergy,  Codeine, Levaquin [levofloxacin], Meat [alpha-gal], Penicillins, Praluent [alirocumab], and Strawberry (diagnostic)   History: Past Medical History:  Diagnosis Date   Allergy    Allergy to alpha-gal    Anxiety    Arthritis    Asthma    CAD (coronary artery disease)    Colon polyp    COVID-19 virus infection 05/2020   Depression    Elevated CK    Food allergy    GERD (gastroesophageal reflux disease)    Hyperlipidemia    Hypertension    Mild tricuspid stenosis    Renal cell carcinoma (HCC)    Urticaria    Past Surgical History:  Procedure Laterality Date   ABDOMINAL HYSTERECTOMY     Carpel Tunnel Right    CHOLECYSTECTOMY     COLONOSCOPY  02/08/2005   [id number] hemorrhoids otherwise normal rectum/ A few scattered, shallow, left-sided sigmoid diverticula/The remainder of the colonic mucosa appeared normal   COLONOSCOPY WITH ESOPHAGOGASTRODUODENOSCOPY (EGD) N/A 11/29/2012   Procedure: COLONOSCOPY WITH ESOPHAGOGASTRODUODENOSCOPY (EGD);  Surgeon: Daneil Dolin, MD;  [facility]: [facility];  Service: Endoscopy;  Laterality: N/A;  10:45   HERNIA REPAIR     ventral X 2 with mesh, last one 2011 ([place]). complicated with 2 week hospital stay   [place]     with mesh   NISSEN FUNDOPLICATION  9811   POLYPECTOMY     ROTATOR CUFF REPAIR Left 04/04/2019   TUBAL LIGATION     Family History  Problem Relation Age of Onset   Heart disease Mother    Alzheimer's disease Mother    Diabetes Mother    Other Father        deceased age 32 from some sort of GI problems but no malignancy   Diabetes Sister    Asthma Sister    Colon polyps Sister    Leukemia Brother        CLL   Diabetes Brother    Diabetes Daughter    Myasthenia gravis Daughter    Pulmonary embolism Daughter    Healthy Son    Healthy Son    Cancer Cousin        pancreatic   Cancer Cousin        pancreatic   Colon cancer Neg Hx    Stomach cancer Neg Hx    Esophageal cancer Neg Hx    Rectal cancer  Neg Hx    Social History   Socioeconomic History   Marital status: Divorced    Spouse name: Not on file   Number of children: 3   Years of education: 12+   Highest education level: Some college, no degree  Occupational History   Occupation: private duty Manufacturing systems engineer  Tobacco Use   Smoking status: Former    Packs/day: 0.50    Years: 28.00    Total pack years: 14.00    Types: Cigarettes    Quit date: 01/17/1996    Years since quitting: 25.5   Smokeless tobacco: Never  Vaping Use   Vaping Use: Never used  Substance and Sexual Activity   Alcohol use: Yes    Alcohol/week: 2.0 standard drinks of alcohol    Types: 2 Glasses of wine per week    Comment: occasional use of wine    Drug use: No   Sexual activity: Not Currently    Partners: Male    Birth control/protection: Surgical  Other Topics Concern   Not on file  Social History Narrative   Lives alone.    Social Determinants of Health   Financial Resource Strain: Medium Risk (07/27/2021)   Overall Financial Resource Strain (CARDIA)    Difficulty of Paying Living Expenses: Somewhat hard  Food Insecurity: Food Insecurity Present (07/27/2021)   Hunger Vital Sign    Worried About Running Out of Food in the Last Year: Sometimes true    Ran Out of Food in the Last Year: Sometimes true  Transportation Needs: No Transportation Needs (07/27/2021)   PRAPARE - Hydrologist (Medical): No    Lack of Transportation (Non-Medical): No  Physical Activity: Insufficiently Active (07/27/2021)   Exercise Vital Sign    Days of Exercise per Week: 4 days    Minutes of Exercise per Session: 30 min  Stress: No Stress Concern Present (07/27/2021)   [place]    Feeling of Stress : Not at all  Social Connections: Moderately Integrated (07/27/2021)   Social Connection and Isolation Panel [NHANES]    Frequency of Communication with Friends and Family: More  than three times a week    Frequency of Social Gatherings with Friends and Family: More than three times a week    Attends Religious Services: More than 4 times per year    Active Member of [employer] or Organizations: Yes    Attends Music therapist: More than 4 times per year    Marital Status: Divorced    Tobacco Counseling Counseling given: Not Answered   Clinical Intake:  Pre-visit preparation completed: Yes  Pain : No/denies pain     BMI - recorded: 31.76 Nutritional Status: BMI > 30  Obese Nutritional Risks: None Diabetes: No  How often do you need to have someone help you when you read instructions, pamphlets, or other written materials from your doctor or pharmacy?: 1 - Never  Diabetic? no  Interpreter Needed?: No  Information entered by :: Loren Sawaya, LPN   Activities of Daily Living    07/27/2021    3:41 PM  In your present state of health, do you have any difficulty performing the following activities:  Hearing? 1  Comment hearing loss left ear only  Vision? 0  Difficulty concentrating or making decisions? 0  Walking or climbing stairs? 0  Dressing or bathing? 0  Doing errands, shopping? 0  Preparing Food and eating ? N  Using the Toilet? N  In the past six months, have you accidently leaked urine? N  Do you have problems with loss of bowel control? N  Managing your Medications? N  Managing your Finances? N  Housekeeping or managing your Housekeeping? N    Patient Care Team: Janora Norlander, DO as PCP - General (Family Medicine) Burnell Blanks, MD as PCP - Cardiology (Cardiology) Milus Banister, MD as Attending Physician (Gastroenterology) McKenzie, Candee Furbish, MD as Consulting Physician (Urology) Netta Cedars, MD as Consulting Physician (Orthopedic Surgery) Jola Schmidt, MD as Consulting Physician (Ophthalmology)  Indicate any recent Medical  Services you may have received from other than [facility] providers in the past  year (date may be approximate).     Assessment:   This is a routine wellness examination for Leah [name].  Hearing/Vision screen Hearing Screening - Comments:: Has trouble hearing from left ear only - declines hearing aid Vision Screening - Comments:: Wears rx glasses - up to date with routine eye exams with [facility]  Dietary issues and exercise activities discussed: Current Exercise Habits: Home exercise routine, Type of exercise: walking, Time (Minutes): 40, Frequency (Times/Week): 4, Weekly Exercise (Minutes/Week): 160, Intensity: Mild, Exercise limited by: respiratory conditions(s)   Goals Addressed             This Visit's Progress    Increase physical activity   On track    Start exercising again Hopes to find a way to make some income     Prevent falls   On track    Stay active       Depression Screen    07/27/2021    3:35 PM 05/31/2021   11:22 AM 05/04/2021   10:12 AM 07/27/2020    4:46 PM 07/26/2020    4:10 PM 06/07/2020    8:43 AM 04/05/2020    9:33 AM  PHQ 2/9 Scores  PHQ - 2 Score 0 0 0 0 0 2 0  PHQ- 9 Score    0 0 6     Fall Risk    07/27/2021    3:32 PM 05/31/2021   11:22 AM 05/04/2021   10:12 AM 07/27/2020    4:46 PM 07/26/2020    4:11 PM  Fall Risk   Falls in the past year? 0 0 0 0 0  Number falls in past yr: 0    0  Injury with Fall? 0    0  Risk for fall due to : No Fall Risks    Impaired vision;No Fall Risks  Follow up Falls prevention discussed    Falls prevention discussed    FALL RISK PREVENTION PERTAINING TO THE HOME:  Any stairs in or around the home? No  If so, are there any without handrails? No  Home free of loose throw rugs in walkways, pet beds, electrical cords, etc? Yes  Adequate lighting in your home to reduce risk of falls? Yes   ASSISTIVE DEVICES UTILIZED TO PREVENT FALLS:  Life alert? No  Use of a cane, walker or w/c? No  Grab bars in the bathroom? Yes  Shower chair or bench in shower? No  Elevated toilet seat or a handicapped  toilet? Yes   TIMED UP AND GO:  Was the test performed? No . Telephonic visit  Cognitive Function:    08/08/2017    4:34 PM  MMSE - Mini Mental State Exam  Orientation to time 5  Orientation to Place 5  Registration 3  Attention/ Calculation 5  Recall 3  Language- name 2 objects 2  Language- repeat 1  Language- follow 3 step command 3  Language- read & follow direction 1  Write a sentence 1  Copy design 1  Total score 30        07/27/2021    3:42 PM 03/26/2019    9:47 AM  6CIT Screen  What Year? 0 points 0 points  What month? 0 points 0 points  What time? 0 points 0 points  Count back from 20 0 points 0 points  Months in reverse 0 points 0 points  Repeat phrase 0 points 0 points  Total Score 0 points 0 points    Immunizations Immunization History  Administered Date(s) Administered   Fluad Quad(high Dose 65+) 12/11/2018, 10/28/2019, 10/29/2020   Influenza,inj,Quad PF,6+ Mos 10/08/2012, 11/26/2013, 10/22/2014, 10/27/2015, 10/18/2016   Influenza,inj,quad, With Preservative 12/11/2018   Influenza-Unspecified 11/10/2016   Moderna Covid-19 Vaccine Bivalent Booster 49yr & up 11/09/2020   Moderna Sars-Covid-2 Vaccination 02/21/2019, 03/22/2019, 11/25/2019   PPD Test 12/21/2020   Pneumococcal Conjugate-13 12/18/2018   Pneumococcal Polysaccharide-23 10/17/2011, 04/05/2020   Td 02/16/2016   Tdap 02/16/2016    TDAP status: Up to date  Flu Vaccine status: Up to date  Pneumococcal vaccine status: Up to date  Covid-19 vaccine status: Completed vaccines  Qualifies for Shingles Vaccine? Yes   Zostavax completed Yes   Shingrix Completed?: No.    Education has been provided regarding the importance of this vaccine. Patient has been advised to call insurance company to determine out of pocket expense if they have not yet received this vaccine. Advised may also receive vaccine at local pharmacy or Health Dept. Verbalized acceptance and understanding.  Screening  Tests Health Maintenance  Topic Date Due   Zoster Vaccines- Shingrix (1 of 2) Never done   COLON CANCER SCREENING ANNUAL FOBT  02/26/2019   INFLUENZA VACCINE  08/16/2021   MAMMOGRAM  02/03/2022   DEXA SCAN  06/14/2022   COLONOSCOPY (Pts 45-453yrInsurance coverage will need to be confirmed)  10/10/2022   TETANUS/TDAP  02/15/2026   Pneumonia Vaccine 6530Years old  Completed   COVID-19 Vaccine  Completed   Hepatitis C Screening  Completed   HPV VACCINES  Aged Out    Health Maintenance  Health Maintenance Due  Topic Date Due   Zoster Vaccines- Shingrix (1 of 2) Never done   COLON CANCER SCREENING ANNUAL FOBT  02/26/2019    Colorectal cancer screening: Type of screening: Colonoscopy. Completed 10/09/2017. Repeat every 5 years  Mammogram status: Completed 02/03/2021. Repeat every year  Bone Density status: Completed 06/13/2017. Results reflect: Bone density results: NORMAL. Repeat every 5 years.  Lung Cancer Screening: (Low Dose CT Chest recommended if Age 70-80ears, 30 pack-year currently smoking OR have quit w/in 15years.) does not qualify.   Additional Screening:  Hepatitis C Screening: does qualify; Completed 08/22/2018  Vision Screening: Recommended annual ophthalmology exams for early detection of glaucoma and other disorders of the eye. Is the patient up to date with their annual eye exam?  Yes  Who is the provider or what is the name of the office in which the patient attends annual eye exams? [facility] If pt is not established with a provider, would they like to be referred to a provider to establish care? No .   Dental Screening: Recommended annual dental exams for proper oral hygiene  Community Resource Referral / Chronic Care Management: CRR required this visit?  No   CCM required this visit?  No      Plan:     I have personally reviewed and noted the following in the patient's chart:   Medical and social history Use of alcohol, tobacco or illicit drugs   Current medications and supplements including opioid prescriptions.  Functional ability and status Nutritional status Physical activity Advanced directives List of other physicians Hospitalizations, surgeries, and ER visits in previous 12 months Vitals Screenings to include cognitive, depression, and falls Referrals and appointments  In addition, I have reviewed and discussed with patient certain preventive protocols, quality metrics, and best practice recommendations. A written personalized care plan for preventive services  as well as general preventive health recommendations were provided to patient.     Sandrea Hammond, LPN   7/51/0258   Nurse Notes: Recently seen [facility] - dx with mass on Left kidney positive for cancer - they want to monitor at this time - told her it has only a 3% chance of metastasizing over the next year.

## 2021-07-28 ENCOUNTER — Telehealth: Payer: Self-pay

## 2021-07-28 NOTE — Telephone Encounter (Signed)
   Telephone encounter was:  Unsuccessful.  07/28/2021 Name: Leah [name] MRN: [id number] DOB: 12-25-1950  Unsuccessful outbound call made today to assist with:  Food Insecurity and Financial Difficulties related to utilities.  Outreach Attempt:  1st Attempt  A HIPAA compliant voice message was left requesting a return call.  Instructed patient to call back at [phone].  Elma Shands, AAS Paralegal, [facility]  [address], [place] [address] ??[email or web address]  ?? [phone]   [email or web address]

## 2021-08-04 ENCOUNTER — Telehealth: Payer: Self-pay

## 2021-08-04 NOTE — Telephone Encounter (Signed)
   Telephone encounter was:  Successful.  08/04/2021 Name: JOANMARIE TSANG MRN: [id number] DOB: 26-Feb-1950  YASHIRA OFFENBERGER is a 71 y.o. year old female who is a primary care patient of Janora Norlander, DO . The community resource team was consulted for assistance with [place] guide performed the following interventions: Patient provided with information about care guide support team and interviewed to confirm resource needs Placed referral to food pantries via [id number] .  Follow Up Plan:  Care guide will follow up with patient by phone over the next 7 days.  Henli Hey, AAS Paralegal, [facility]  [address], [place] [address] ??[email or web address]  ?? [phone]   [email or web address]

## 2021-08-04 NOTE — Telephone Encounter (Signed)
Entered in error

## 2021-08-08 ENCOUNTER — Telehealth: Payer: Self-pay

## 2021-08-08 NOTE — Telephone Encounter (Signed)
   Telephone encounter was:  Unsuccessful.  08/08/2021 Name: Leah [name] MRN: [id number] DOB: [birth year]  Unsuccessful outbound call made today to assist with:  Food Insecurity  Outreach Attempt:  1st Attempt  Attempted to call [id number] to follow up on referral placed 08/04/21, they are only open from Wed.-Fri. and there is no option to leave a message.  I will call later this week.  Simone Curia, AAS Paralegal, [facility] [facility]  [address], [place] [address] ??[email or web address]  ?? [phone]   [email or web address]

## 2021-08-08 NOTE — Telephone Encounter (Signed)
   Telephone encounter was:  Successful.  08/08/2021 Name: LORIN HAUCK MRN: [id number] DOB: 04-15-1950  DEDE DOBESH is a 71 y.o. year old female who is a primary care patient of Janora Norlander, DO . The community resource team was consulted for assistance with Financial Difficulties related to utilities  Care guide performed the following interventions: Sent email to   Ernest Mallick at [facility] DSS/LIEAP program to follow up on referral sent 08/04/21.         Follow Up Plan:   waiting for response from email sent to [facility].  Kieron Kantner, AAS Paralegal, [facility]  [address], [place] [address] ??[email or web address]  ?? [phone]   [email or web address]

## 2021-08-11 ENCOUNTER — Telehealth: Payer: Self-pay

## 2021-08-11 NOTE — Telephone Encounter (Signed)
   Telephone encounter was:  Successful.  08/11/2021 Name: [name] MRN: [id number] DOB: August 10, 1950  Leah [name] is a 71 y.o. year old female who is a primary care patient of Janora Norlander, DO . The community resource team was consulted for assistance with Food Insecurity and Financial Difficulties related to utilities.  Care guide performed the following interventions: Spoke with Jerrye Beavers at [employer], he requested that I send him an email with the patient's information and he will check on her referral.   Spoke with patient, she has received the mailed information and will pick up food from the [id number] pantry today.  She is also in the process of completing an application for assitance from [employer].   Follow Up Plan:  No further follow up planned at this time. The patient has been provided with needed resources.  Clemons Salvucci, AAS Paralegal, [facility]  [address], [place] [address] ??[email or web address]  ?? [phone]   [email or web address]

## 2021-08-12 ENCOUNTER — Other Ambulatory Visit: Payer: Self-pay | Admitting: Family Medicine

## 2021-08-12 DIAGNOSIS — E669 Obesity, unspecified: Secondary | ICD-10-CM

## 2021-08-30 ENCOUNTER — Encounter: Payer: Self-pay | Admitting: Family Medicine

## 2021-08-30 ENCOUNTER — Ambulatory Visit (INDEPENDENT_AMBULATORY_CARE_PROVIDER_SITE_OTHER): Payer: Medicare Other | Admitting: Family Medicine

## 2021-08-30 ENCOUNTER — Ambulatory Visit: Payer: Medicare Other | Admitting: Family Medicine

## 2021-08-30 VITALS — BP 112/69 | HR 75 | Temp 97.5°F | Ht 64.0 in | Wt 185.6 lb

## 2021-08-30 DIAGNOSIS — G5761 Lesion of plantar nerve, right lower limb: Secondary | ICD-10-CM

## 2021-08-30 DIAGNOSIS — L602 Onychogryphosis: Secondary | ICD-10-CM | POA: Diagnosis not present

## 2021-08-30 MED ORDER — DICLOFENAC SODIUM 75 MG PO TBEC
75.0000 mg | DELAYED_RELEASE_TABLET | Freq: Two times a day (BID) | ORAL | 0 refills | Status: DC
Start: 2021-08-30 — End: 2022-03-15

## 2021-08-30 NOTE — Progress Notes (Signed)
Assessment & Plan:  1. Morton's neuroma of right foot Education provided on Morton's neuralgia.  Encouraged routine use of diclofenac and shoe inserts to relieve the pain.  Referring to podiatry for her toenails and discussed if this plan is not helpful, she needs to mention the pain to them and they can possibly give her an injection in her foot. - diclofenac (VOLTAREN) 75 MG EC tablet; Take 1 tablet (75 mg total) by mouth 2 (two) times daily.  Dispense: 60 tablet; Refill: 0  2. Thickened nail - Ambulatory referral to Podiatry   Follow up plan: Return if symptoms worsen or fail to improve.  Hendricks Limes, MSN, APRN, FNP-C [facility]  Subjective:   Patient ID: Leah [name], female    DOB: [birth year], 71 y.o.   MRN: [id number]  HPI: Leah [name] is a 71 y.o. female presenting on 08/30/2021 for Foot Pain (Right foot pain on the bottom and top x 1 month . Patient states that the swelling gets worse through out the day )  Patient reports pain of her right foot for the past month.  She does also experience swelling that gets worse throughout the day.  The pain occurs between her second and third toes on the top and bottom of her foot.  She describes the pain as constant, burning and pins sticking in it.  Rates the pain 6-7/10.  States she can hardly wear shoes due to the pain.  Denies wearing tight or high heel shoes.  Does not remember if she did anything to her foot.  States she could have dropped something on it and just brushed it off not thinking it was a big deal.  She also questions if the pain could be due to pushing the forward and backward foot lever on her lawnmower, as she does do all of her own yard work.  She has been applying Biofreeze and taking rapid relief which relieves the pain and allows her to get some sleep.   ROS: Negative unless specifically indicated above in HPI.   Relevant past medical history reviewed and updated as indicated.    Allergies and medications reviewed and updated.   Current Outpatient Medications:    albuterol (PROAIR HFA) 108 (90 Base) MCG/ACT inhaler, Inhale 2 puffs into the lungs every 4 (four) hours as needed for wheezing or shortness of breath., Disp: 8.5 g, Rfl: 3   Albuterol Sulfate 2.5 MG/0.5ML NEBU, Take 0.5 mLs (2.5 mg total) by nebulization every 6 (six) hours as needed., Disp: 20 mL, Rfl: 3   aspirin 81 MG tablet, Take 81 mg by mouth daily., Disp: , Rfl:    calcium carbonate (TUMS) 500 MG chewable tablet, Chew 1 tablet (200 mg of elemental calcium total) by mouth as needed for indigestion or heartburn., Disp: , Rfl:    Cholecalciferol (VITAMIN D) 2000 UNITS tablet, Take 2,000 Units by mouth daily., Disp: , Rfl:    EPINEPHrine 0.3 mg/0.3 mL IJ SOAJ injection, Inject into the muscle once., Disp: , Rfl:    famotidine (PEPCID) 40 MG tablet, TAKE ONE TABLET BY MOUTH DAILY, Disp: 90 tablet, Rfl: 0   fish oil-omega-3 fatty acids 1000 MG capsule, Take 2 g by mouth daily. , Disp: , Rfl:    fluticasone (FLONASE) 50 MCG/ACT nasal spray, Place 2 sprays into both nostrils daily., Disp: 16 g, Rfl: 6   loratadine (CLARITIN) 10 MG tablet, Take 10 mg by mouth 2 (two) times daily., Disp: , Rfl:  losartan-hydrochlorothiazide (HYZAAR) 100-12.5 MG tablet, TAKE ONE TABLET BY MOUTH DAILY, Disp: 90 tablet, Rfl: 0   metFORMIN (GLUCOPHAGE-XR) 500 MG 24 hr tablet, TAKE ONE TABLET BY MOUTH DAILY WITH BREAKFAST, Disp: 90 tablet, Rfl: 0   Multiple Vitamins-Minerals (EMERGEN-C IMMUNE PO), Take by mouth., Disp: , Rfl:    pantoprazole (PROTONIX) 40 MG tablet, Take 1 tablet (40 mg total) by mouth daily. For stomach, Disp: 30 tablet, Rfl: 11   Polyvinyl Alcohol-Povidone (REFRESH OP), Apply to eye., Disp: , Rfl:    rosuvastatin (CRESTOR) 10 MG tablet, Take 1 tablet (10 mg total) by mouth daily., Disp: 90 tablet, Rfl: 3  Allergies  Allergen Reactions   Beef Allergy Anaphylaxis   Pork Allergy Anaphylaxis   Codeine Nausea  And Vomiting   Levaquin [Levofloxacin] Other (See Comments)    Achilles tendon pain   Meat [Alpha-Gal]    Penicillins Other (See Comments)    Does not know   Praluent [Alirocumab]     Rash and myalgias   Strawberry (Diagnostic)     Objective:   BP 112/69   Pulse 75   Temp (!) 97.5 F (36.4 C) (Temporal)   Ht '5\' 4"'$  (1.626 m)   Wt 185 lb 9.6 oz (84.2 kg)   SpO2 96%   BMI 31.86 kg/m    Physical Exam Vitals reviewed.  Constitutional:      General: She is not in acute distress.    Appearance: Normal appearance. She is not ill-appearing, toxic-appearing or diaphoretic.  HENT:     Head: Normocephalic and atraumatic.  Eyes:     General: No scleral icterus.       Right eye: No discharge.        Left eye: No discharge.     Conjunctiva/sclera: Conjunctivae normal.  Cardiovascular:     Rate and Rhythm: Normal rate.  Pulmonary:     Effort: Pulmonary effort is normal. No respiratory distress.  Musculoskeletal:        General: Normal range of motion.     Cervical back: Normal range of motion.  Feet:     Left foot:     Skin integrity: Skin integrity normal.     Toenail Condition: Left toenails are abnormally thick and long. Fungal disease present.    Comments: Tenderness just behind area between 2nd and 3rd toes. Skin:    General: Skin is warm and dry.     Capillary Refill: Capillary refill takes less than 2 seconds.  Neurological:     General: No focal deficit present.     Mental Status: She is alert and oriented to person, place, and time. Mental status is at baseline.  Psychiatric:        Mood and Affect: Mood normal.        Behavior: Behavior normal.        Thought Content: Thought content normal.        Judgment: Judgment normal.

## 2021-08-31 ENCOUNTER — Ambulatory Visit: Payer: Medicare Other

## 2021-09-13 ENCOUNTER — Other Ambulatory Visit: Payer: Self-pay | Admitting: Family Medicine

## 2021-09-15 ENCOUNTER — Ambulatory Visit (INDEPENDENT_AMBULATORY_CARE_PROVIDER_SITE_OTHER): Payer: Medicare Other | Admitting: Podiatry

## 2021-09-15 ENCOUNTER — Ambulatory Visit (INDEPENDENT_AMBULATORY_CARE_PROVIDER_SITE_OTHER): Payer: Medicare Other

## 2021-09-15 DIAGNOSIS — M2041 Other hammer toe(s) (acquired), right foot: Secondary | ICD-10-CM

## 2021-09-15 DIAGNOSIS — B351 Tinea unguium: Secondary | ICD-10-CM | POA: Diagnosis not present

## 2021-09-15 DIAGNOSIS — L603 Nail dystrophy: Secondary | ICD-10-CM | POA: Diagnosis not present

## 2021-09-15 DIAGNOSIS — M21619 Bunion of unspecified foot: Secondary | ICD-10-CM

## 2021-09-18 NOTE — Progress Notes (Signed)
  Subjective:  Patient ID: Leah [name], female    DOB: 1951-01-01,  MRN: [id number]  Chief Complaint  Patient presents with   Nail Problem     thick painful toenails   Foot Pain    71 y.o. female presents with the above complaint. History confirmed with patient. Patient presents with thickened discolored toenails. The nails cause her pain and she is not able to trim them. Wants to know if anything can be done for the thickness. Not tried any topical or oral antifungal medications.   Objective:  Physical Exam: warm, good capillary refill, nail exam onychomycosis of the toenails and dystrophic nails, no trophic changes or ulcerative lesions. DP pulses palpable, PT pulses palpable, and protective sensation intact Left Foot: normal exam, no swelling, tenderness, instability; ligaments intact, full range of motion of all ankle/foot joints  Right Foot: normal exam, no swelling, tenderness, instability; ligaments intact, full range of motion of all ankle/foot joints   XR taken and reveiwed right foot 3 views. Contracture deformity noted of the lesser digits, arthritic changes noted at the PIPJ of digits 2-5.   Assessment:   1. Tinea unguium   2. Bunion   3. Hammer toe of right foot   4. Nail dystrophy      Plan:  Patient was evaluated and treated and all questions answered.  Onychomycosis, Onychodystrophy, and bilateral -Nails palliatively debrided secondary to pain Nails were filed to reduce thickness. Discussed regular filing of the nails to prevent them getting painful. Could try topical or oral antifungal however do not believe these medications will be helpful given severity of her fungal nail infection. Patient can follow up as needed.   No follow-ups on file.

## 2021-11-14 ENCOUNTER — Ambulatory Visit (INDEPENDENT_AMBULATORY_CARE_PROVIDER_SITE_OTHER): Payer: Medicare Other

## 2021-11-14 DIAGNOSIS — Z23 Encounter for immunization: Secondary | ICD-10-CM

## 2021-12-10 ENCOUNTER — Other Ambulatory Visit: Payer: Self-pay | Admitting: Family Medicine

## 2021-12-13 ENCOUNTER — Encounter: Payer: Self-pay | Admitting: Family Medicine

## 2021-12-13 ENCOUNTER — Ambulatory Visit (INDEPENDENT_AMBULATORY_CARE_PROVIDER_SITE_OTHER): Payer: Medicare Other | Admitting: Family Medicine

## 2021-12-13 ENCOUNTER — Ambulatory Visit (INDEPENDENT_AMBULATORY_CARE_PROVIDER_SITE_OTHER): Payer: Medicare Other

## 2021-12-13 VITALS — BP 159/80 | HR 74 | Temp 97.4°F | Ht 64.0 in | Wt 184.6 lb

## 2021-12-13 DIAGNOSIS — M79641 Pain in right hand: Secondary | ICD-10-CM | POA: Diagnosis not present

## 2021-12-13 DIAGNOSIS — S62662A Nondisplaced fracture of distal phalanx of right middle finger, initial encounter for closed fracture: Secondary | ICD-10-CM | POA: Diagnosis not present

## 2021-12-13 DIAGNOSIS — T1490XA Injury, unspecified, initial encounter: Secondary | ICD-10-CM

## 2021-12-13 DIAGNOSIS — M1811 Unilateral primary osteoarthritis of first carpometacarpal joint, right hand: Secondary | ICD-10-CM | POA: Diagnosis not present

## 2021-12-13 NOTE — Progress Notes (Signed)
Subjective:  Patient ID: Leah [name], female    DOB: Apr 04, 1950, 71 y.o.   MRN: [id number]  Patient Care Team: Janora Norlander, DO as PCP - General (Family Medicine) Burnell Blanks, MD as PCP - Cardiology (Cardiology) Milus Banister, MD as Attending Physician (Gastroenterology) Alyson Ingles Candee Furbish, MD as Consulting Physician (Urology) Netta Cedars, MD as Consulting Physician (Orthopedic Surgery) Jola Schmidt, MD as Consulting Physician (Ophthalmology)   Chief Complaint:  Hand Pain (Right hand pain after smashing fingers in her garage )   HPI: Leah [name] is a 71 y.o. female presenting on 12/13/2021 for Hand Pain (Right hand pain after smashing fingers in her garage )   Pt presents today for evaluation of her right hand after smashing her finger in the garage door.   Hand Pain  The incident occurred 1 to 3 hours ago. The incident occurred at home. The injury mechanism was a direct blow. The pain is present in the right fingers. The quality of the pain is described as aching (throbbing). The pain does not radiate. The pain is moderate. The pain has been Constant since the incident. Pertinent negatives include no chest pain, muscle weakness, numbness or tingling. The symptoms are aggravated by movement and palpation. She has tried ice for the symptoms. The treatment provided mild relief.    Relevant past medical, surgical, family, and social history reviewed and updated as indicated.  Allergies and medications reviewed and updated. Data reviewed: Chart in Epic.   Past Medical History:  Diagnosis Date   Allergy    Allergy to alpha-gal    Anxiety    Arthritis    Asthma    CAD (coronary artery disease)    Colon polyp    COVID-19 virus infection 05/2020   Depression    Elevated CK    Food allergy    GERD (gastroesophageal reflux disease)    Hyperlipidemia    Hypertension    Mild tricuspid stenosis    Renal cell carcinoma (HCC)    Urticaria      Past Surgical History:  Procedure Laterality Date   ABDOMINAL HYSTERECTOMY     Carpel Tunnel Right    CHOLECYSTECTOMY     COLONOSCOPY  02/08/2005   [id number] hemorrhoids otherwise normal rectum/ A few scattered, shallow, left-sided sigmoid diverticula/The remainder of the colonic mucosa appeared normal   COLONOSCOPY WITH ESOPHAGOGASTRODUODENOSCOPY (EGD) N/A 11/29/2012   Procedure: COLONOSCOPY WITH ESOPHAGOGASTRODUODENOSCOPY (EGD);  Surgeon: Daneil Dolin, MD;  Location: [facility];  Service: Endoscopy;  Laterality: N/A;  10:45   HERNIA REPAIR     ventral X 2 with mesh, last one 2011 ([place]). complicated with 2 week hospital stay   [place]     with mesh   NISSEN FUNDOPLICATION  4259   POLYPECTOMY     ROTATOR CUFF REPAIR Left 04/04/2019   TUBAL LIGATION      Social History   Socioeconomic History   Marital status: Divorced    Spouse name: Not on file   Number of children: 3   Years of education: 12+   Highest education level: Some college, no degree  Occupational History   Occupation: private duty Manufacturing systems engineer  Tobacco Use   Smoking status: Former    Packs/day: 0.50    Years: 28.00    Total pack years: 14.00    Types: Cigarettes    Quit date: 01/17/1996    Years since quitting: 25.9   Smokeless tobacco: Never  Vaping  Use   Vaping Use: Never used  Substance and Sexual Activity   Alcohol use: Yes    Alcohol/week: 2.0 standard drinks of alcohol    Types: 2 Glasses of wine per week    Comment: occasional use of wine    Drug use: No   Sexual activity: Not Currently    Partners: Male    Birth control/protection: Surgical  Other Topics Concern   Not on file  Social History Narrative   Lives alone.    Social Determinants of Health   Financial Resource Strain: Medium Risk (08/04/2021)   Overall Financial Resource Strain (CARDIA)    Difficulty of Paying Living Expenses: Somewhat hard  Food Insecurity: Food Insecurity Present (08/04/2021)   Hunger  Vital Sign    Worried About Running Out of Food in the Last Year: Sometimes true    Ran Out of Food in the Last Year: Sometimes true  Transportation Needs: No Transportation Needs (07/27/2021)   PRAPARE - Hydrologist (Medical): No    Lack of Transportation (Non-Medical): No  Physical Activity: Insufficiently Active (07/27/2021)   Exercise Vital Sign    Days of Exercise per Week: 4 days    Minutes of Exercise per Session: 30 min  Stress: No Stress Concern Present (07/27/2021)   [place]    Feeling of Stress : Not at all  Social Connections: Moderately Integrated (07/27/2021)   Social Connection and Isolation Panel [NHANES]    Frequency of Communication with Friends and Family: More than three times a week    Frequency of Social Gatherings with Friends and Family: More than three times a week    Attends Religious Services: More than 4 times per year    Active Member of [employer] or Organizations: Yes    Attends Music therapist: More than 4 times per year    Marital Status: Divorced  Intimate Partner Violence: Not At Risk (07/27/2021)   Humiliation, Afraid, Rape, and Kick questionnaire    Fear of Current or Ex-Partner: No    Emotionally Abused: No    Physically Abused: No    Sexually Abused: No    Outpatient Encounter Medications as of 12/13/2021  Medication Sig   albuterol (PROAIR HFA) 108 (90 Base) MCG/ACT inhaler Inhale 2 puffs into the lungs every 4 (four) hours as needed for wheezing or shortness of breath.   Albuterol Sulfate 2.5 MG/0.5ML NEBU Take 0.5 mLs (2.5 mg total) by nebulization every 6 (six) hours as needed.   aspirin 81 MG tablet Take 81 mg by mouth daily.   calcium carbonate (TUMS) 500 MG chewable tablet Chew 1 tablet (200 mg of elemental calcium total) by mouth as needed for indigestion or heartburn.   Cholecalciferol (VITAMIN D) 2000 UNITS tablet Take 2,000 Units  by mouth daily.   diclofenac (VOLTAREN) 75 MG EC tablet Take 1 tablet (75 mg total) by mouth 2 (two) times daily.   EPINEPHrine 0.3 mg/0.3 mL IJ SOAJ injection Inject into the muscle once.   famotidine (PEPCID) 40 MG tablet Take 1 tablet (40 mg total) by mouth daily. (NEEDS TO BE SEEN BEFORE NEXT REFILL)   fish oil-omega-3 fatty acids 1000 MG capsule Take 2 g by mouth daily.    fluticasone (FLONASE) 50 MCG/ACT nasal spray Place 2 sprays into both nostrils daily.   loratadine (CLARITIN) 10 MG tablet Take 10 mg by mouth 2 (two) times daily.   losartan-hydrochlorothiazide (HYZAAR) 100-12.5 MG  tablet Take 1 tablet by mouth daily. (NEEDS TO BE SEEN BEFORE NEXT REFILL)   Multiple Vitamins-Minerals (EMERGEN-C IMMUNE PO) Take by mouth.   pantoprazole (PROTONIX) 40 MG tablet Take 1 tablet (40 mg total) by mouth daily. For stomach   Polyvinyl Alcohol-Povidone (REFRESH OP) Apply to eye.   rosuvastatin (CRESTOR) 10 MG tablet Take 1 tablet (10 mg total) by mouth daily.   metFORMIN (GLUCOPHAGE-XR) 500 MG 24 hr tablet TAKE ONE TABLET BY MOUTH DAILY WITH BREAKFAST (Patient not taking: Reported on 12/13/2021)   No facility-administered encounter medications on file as of 12/13/2021.    Allergies  Allergen Reactions   Beef Allergy Anaphylaxis   Pork Allergy Anaphylaxis and Other (See Comments)   Beef (Bovine) Protein Other (See Comments)   Codeine Nausea And Vomiting   Levaquin [Levofloxacin] Other (See Comments)    Achilles tendon pain   Meat [Alpha-Gal]    Penicillins Other (See Comments)    Does not know   Praluent [Alirocumab]     Rash and myalgias   Strawberry (Diagnostic)    Strawberry Extract Other (See Comments)    Review of Systems  Cardiovascular:  Negative for chest pain.  Musculoskeletal:  Positive for arthralgias, joint swelling and myalgias.  Skin:  Positive for color change.  Neurological:  Negative for tingling and numbness.  All other systems reviewed and are negative.        Objective:  BP (!) 159/80   Pulse 74   Temp (!) 97.4 F (36.3 C) (Temporal)   Ht _0  (1.626 m)   Wt 184 lb 9.6 oz (83.7 kg)   SpO2 99%   BMI 31.69 kg/m    Wt Readings from Last 3 Encounters:  12/13/21 184 lb 9.6 oz (83.7 kg)  08/30/21 185 lb 9.6 oz (84.2 kg)  07/27/21 185 lb (83.9 kg)    Physical Exam Vitals and nursing note reviewed.  Constitutional:      General: She is not in acute distress.    Appearance: Normal appearance. She is obese. She is not ill-appearing, toxic-appearing or diaphoretic.  HENT:     Head: Normocephalic and atraumatic.     Mouth/Throat:     Mouth: Mucous membranes are moist.  Eyes:     Pupils: Pupils are equal, round, and reactive to light.  Cardiovascular:     Rate and Rhythm: Normal rate and regular rhythm.     Pulses: Normal pulses.     Heart sounds: Normal heart sounds.  Pulmonary:     Effort: Pulmonary effort is normal.     Breath sounds: Normal breath sounds.  Musculoskeletal:     Right hand: Swelling, tenderness and bony tenderness present. No deformity or lacerations. Decreased range of motion. Decreased strength of finger abduction. Normal sensation. There is no disruption of two-point discrimination. Normal capillary refill. Normal pulse.       Arms:  Skin:    General: Skin is warm and dry.     Capillary Refill: Capillary refill takes less than 2 seconds.     Findings: Bruising present.  Neurological:     General: No focal deficit present.     Mental Status: She is alert and oriented to person, place, and time.  Psychiatric:        Mood and Affect: Mood normal.        Behavior: Behavior normal.        Thought Content: Thought content normal.        Judgment: Judgment normal.  Results for orders placed or performed in visit on 05/04/21  Bayer DCA Hb A1c Waived  Result Value Ref Range   HB A1C (BAYER DCA - WAIVED) 5.8 (H) 4.8 - 5.6 %  Lipid Panel  Result Value Ref Range   Cholesterol, Total 278 (H) 100 - 199 mg/dL    Triglycerides 149 0 - 149 mg/dL   HDL 53 >39 mg/dL   VLDL Cholesterol Cal 27 5 - 40 mg/dL   LDL Chol Calc (NIH) 198 (H) 0 - 99 mg/dL   Lipid Comment: Comment    Chol/HDL Ratio 5.2 (H) 0.0 - 4.4 ratio  CMP14+EGFR  Result Value Ref Range   Glucose 92 70 - 99 mg/dL   BUN 15 8 - 27 mg/dL   Creatinine, Ser 0.97 0.57 - 1.00 mg/dL   eGFR 63 >59 mL/min/1.73   BUN/Creatinine Ratio 15 12 - 28   Sodium 145 (H) 134 - 144 mmol/L   Potassium 4.5 3.5 - 5.2 mmol/L   Chloride 105 96 - 106 mmol/L   CO2 26 20 - 29 mmol/L   Calcium 9.4 8.7 - 10.3 mg/dL   Total Protein 6.0 6.0 - 8.5 g/dL   Albumin 4.0 3.8 - 4.8 g/dL   Globulin, Total 2.0 1.5 - 4.5 g/dL   Albumin/Globulin Ratio 2.0 1.2 - 2.2   Bilirubin Total 0.3 0.0 - 1.2 mg/dL   Alkaline Phosphatase 96 44 - 121 IU/L   AST 15 0 - 40 IU/L   ALT 13 0 - 32 IU/L  TSH  Result Value Ref Range   TSH 2.020 0.450 - 4.500 uIU/mL  CBC with Differential  Result Value Ref Range   WBC 3.5 3.4 - 10.8 x10E3/uL   RBC 4.88 3.77 - 5.28 x10E6/uL   Hemoglobin 13.1 11.1 - 15.9 g/dL   Hematocrit 40.1 34.0 - 46.6 %   MCV 82 79 - 97 fL   MCH 26.8 26.6 - 33.0 pg   MCHC 32.7 31.5 - 35.7 g/dL   RDW 13.7 11.7 - 15.4 %   Platelets 278 150 - 450 x10E3/uL   Neutrophils 49 Not Estab. %   Lymphs 39 Not Estab. %   Monocytes 9 Not Estab. %   Eos 2 Not Estab. %   Basos 1 Not Estab. %   Neutrophils Absolute 1.7 1.4 - 7.0 x10E3/uL   Lymphocytes Absolute 1.4 0.7 - 3.1 x10E3/uL   Monocytes Absolute 0.3 0.1 - 0.9 x10E3/uL   EOS (ABSOLUTE) 0.1 0.0 - 0.4 x10E3/uL   Basophils Absolute 0.0 0.0 - 0.2 x10E3/uL   Immature Granulocytes 0 Not Estab. %   Immature Grans (Abs) 0.0 0.0 - 0.1 x10E3/uL     X-Ray: right hand: small avulsion fracture to distal phalanx of middle finger. Preliminary x-ray reading by Monia Pouch, FNP-C, [facility].   Pertinent labs & imaging results that were available during my care of the patient were reviewed by me and considered in my medical decision  making.  Assessment & Plan:  Staisha was seen today for hand pain.  Diagnoses and all orders for this visit:  Injury -     DG Hand Complete Right; Future  Closed nondisplaced fracture of distal phalanx of right middle finger, initial encounter Splint applied in office. RICE therapy discussed in detail. Will notify pt if radiology reading differs. Report new or worsening symptoms. Tylenol as needed for pain control. Return in 4 weeks for repeat imaging.     Continue all other maintenance medications.  Follow up plan: Return  in about 4 weeks (around 01/10/2022), or if symptoms worsen or fail to improve, for finger imaging.   Continue healthy lifestyle choices, including diet (rich in fruits, vegetables, and lean proteins, and low in salt and simple carbohydrates) and exercise (at least 30 minutes of moderate physical activity daily).  Educational handout given for finger fracture.   The above assessment and management plan was discussed with the patient. The patient verbalized understanding of and has agreed to the management plan. Patient is aware to call the clinic if they develop any new symptoms or if symptoms persist or worsen. Patient is aware when to return to the clinic for a follow-up visit. Patient educated on when it is appropriate to go to the emergency department.   Monia Pouch, FNP-C [facility] [phone]

## 2021-12-14 NOTE — Progress Notes (Signed)
Patient returning call, Please call back

## 2021-12-15 NOTE — Progress Notes (Signed)
Patient returning call. Please call back

## 2022-01-08 ENCOUNTER — Other Ambulatory Visit: Payer: Self-pay | Admitting: Family Medicine

## 2022-01-11 ENCOUNTER — Telehealth: Payer: Self-pay | Admitting: Family Medicine

## 2022-01-11 DIAGNOSIS — G5761 Lesion of plantar nerve, right lower limb: Secondary | ICD-10-CM

## 2022-01-11 MED ORDER — FAMOTIDINE 40 MG PO TABS: 40.0000 mg | ORAL_TABLET | Freq: Every day | ORAL | 0 refills | Status: DC

## 2022-01-11 MED ORDER — LOSARTAN POTASSIUM-HCTZ 100-12.5 MG PO TABS: 1.0000 | ORAL_TABLET | Freq: Every day | ORAL | 0 refills | Status: DC

## 2022-01-11 NOTE — Telephone Encounter (Signed)
Na- meds sent  to [facility] in [place] and labs ordered

## 2022-01-11 NOTE — Telephone Encounter (Signed)
Patient has appointment scheduled for 04/26/2022 for CPE with DR. G. and will need medication refilled forfamotidine (PEPCID) 40 MG tablet [Pharmacy Med Name: FAMOTIDINE 40 MG TABLET] and  losartan-hydrochlorothiazide (HYZAAR) 100-12.5 MG tablet [Pharmacy Med Name: LOSARTAN-HCTZ 100-12.5 MG TAB] until his appointment date.  Please advise. Pt also wants to know if she needs to come in and have labs drawn?

## 2022-01-17 ENCOUNTER — Other Ambulatory Visit: Payer: Self-pay | Admitting: Family Medicine

## 2022-01-17 DIAGNOSIS — Z1231 Encounter for screening mammogram for malignant neoplasm of breast: Secondary | ICD-10-CM

## 2022-02-12 ENCOUNTER — Other Ambulatory Visit: Payer: Self-pay | Admitting: Family Medicine

## 2022-03-08 ENCOUNTER — Ambulatory Visit
Admission: RE | Admit: 2022-03-08 | Discharge: 2022-03-08 | Disposition: A | Discharge status: Home / Self Care | Payer: Medicare HMO | Source: Ambulatory Visit | Attending: Family Medicine | Admitting: Family Medicine

## 2022-03-08 DIAGNOSIS — Z1231 Encounter for screening mammogram for malignant neoplasm of breast: Secondary | ICD-10-CM | POA: Diagnosis not present

## 2022-03-14 NOTE — Progress Notes (Unsigned)
No chief complaint on file.  History of Present Illness: 72 yo female with history of HTN, HLD and mild CAD who is here today for cardiac follow up. She was admitted to [facility] January 2019 with chest pain.and had a coronary CTA which showed mild non-obstructive CAD. She has been statin intolerant in the past. She has also not tolerated Zetia. She was seen in the lipid clinic 02/20/17 and was started on Praluent but she did not tolerate it. She has been taking Crestor 10 mg daily over the past year and tolerating ***. Normal ABI September 2022. Echo LVEF=55-60%. No valve disease. Coronary CTA August 2022 with mild CAD.    She is here today for follow up. The patient denies any chest pain, dyspnea, palpitations, lower extremity edema, orthopnea, PND, dizziness, near syncope or syncope.   Primary Care Physician: Janora Norlander, DO  Past Medical History:  Diagnosis Date   Allergy    Allergy to alpha-gal    Anxiety    Arthritis    Asthma    CAD (coronary artery disease)    Colon polyp    COVID-19 virus infection 05/2020   Depression    Elevated CK    Food allergy    GERD (gastroesophageal reflux disease)    Hyperlipidemia    Hypertension    Mild tricuspid stenosis    Renal cell carcinoma (HCC)    Urticaria     Past Surgical History:  Procedure Laterality Date   ABDOMINAL HYSTERECTOMY     Carpel Tunnel Right    CHOLECYSTECTOMY     COLONOSCOPY  02/08/2005   [id number] hemorrhoids otherwise normal rectum/ A few scattered, shallow, left-sided sigmoid diverticula/The remainder of the colonic mucosa appeared normal   COLONOSCOPY WITH ESOPHAGOGASTRODUODENOSCOPY (EGD) N/A 11/29/2012   Procedure: COLONOSCOPY WITH ESOPHAGOGASTRODUODENOSCOPY (EGD);  Surgeon: Daneil Dolin, MD;  [facility];  Service: Endoscopy;  Laterality: N/A;  10:45   HERNIA REPAIR     ventral X 2 with mesh, last one 2011 ([place]). complicated with 2 week hospital stay   HERNIA REPAIR     with  mesh   NISSEN FUNDOPLICATION  0000000   POLYPECTOMY     ROTATOR CUFF REPAIR Left 04/04/2019   TUBAL LIGATION      Current Outpatient Medications  Medication Sig Dispense Refill   albuterol (PROAIR HFA) 108 (90 Base) MCG/ACT inhaler Inhale 2 puffs into the lungs every 4 (four) hours as needed for wheezing or shortness of breath. 8.5 g 3   Albuterol Sulfate 2.5 MG/0.5ML NEBU Take 0.5 mLs (2.5 mg total) by nebulization every 6 (six) hours as needed. 20 mL 3   aspirin 81 MG tablet Take 81 mg by mouth daily.     calcium carbonate (TUMS) 500 MG chewable tablet Chew 1 tablet (200 mg of elemental calcium total) by mouth as needed for indigestion or heartburn.     Cholecalciferol (VITAMIN D) 2000 UNITS tablet Take 2,000 Units by mouth daily.     diclofenac (VOLTAREN) 75 MG EC tablet Take 1 tablet (75 mg total) by mouth 2 (two) times daily. 60 tablet 0   EPINEPHrine 0.3 mg/0.3 mL IJ SOAJ injection Inject into the muscle once.     famotidine (PEPCID) 40 MG tablet Take 1 tablet (40 mg total) by mouth daily. 90 tablet 0   fish oil-omega-3 fatty acids 1000 MG capsule Take 2 g by mouth daily.      fluticasone (FLONASE) 50 MCG/ACT nasal spray Place 2 sprays into  both nostrils daily. 16 g 6   loratadine (CLARITIN) 10 MG tablet Take 10 mg by mouth 2 (two) times daily.     losartan-hydrochlorothiazide (HYZAAR) 100-12.5 MG tablet Take 1 tablet by mouth daily. 90 tablet 0   metFORMIN (GLUCOPHAGE-XR) 500 MG 24 hr tablet TAKE ONE TABLET BY MOUTH DAILY WITH BREAKFAST (Patient not taking: Reported on 12/13/2021) 90 tablet 0   Multiple Vitamins-Minerals (EMERGEN-C IMMUNE PO) Take by mouth.     pantoprazole (PROTONIX) 40 MG tablet Take 1 tablet (40 mg total) by mouth daily. For stomach 30 tablet 11   Polyvinyl Alcohol-Povidone (REFRESH OP) Apply to eye.     rosuvastatin (CRESTOR) 10 MG tablet Take 1 tablet (10 mg total) by mouth daily. 90 tablet 3   No current facility-administered medications for this visit.     Allergies  Allergen Reactions   Beef Allergy Anaphylaxis   Pork Allergy Anaphylaxis and Other (See Comments)   Beef (Bovine) Protein Other (See Comments)   Codeine Nausea And Vomiting   Levaquin [Levofloxacin] Other (See Comments)    Achilles tendon pain   Meat [Alpha-Gal]    Penicillins Other (See Comments)    Does not know   Praluent [Alirocumab]     Rash and myalgias   Strawberry (Diagnostic)    Strawberry Extract Other (See Comments)    Social History   Socioeconomic History   Marital status: Divorced    Spouse name: Not on file   Number of children: 3   Years of education: 12+   Highest education level: Some college, no degree  Occupational History   Occupation: private duty Manufacturing systems engineer  Tobacco Use   Smoking status: Former    Packs/day: 0.50    Years: 28.00    Total pack years: 14.00    Types: Cigarettes    Quit date: 01/17/1996    Years since quitting: 26.1   Smokeless tobacco: Never  Vaping Use   Vaping Use: Never used  Substance and Sexual Activity   Alcohol use: Yes    Alcohol/week: 2.0 standard drinks of alcohol    Types: 2 Glasses of wine per week    Comment: occasional use of wine    Drug use: No   Sexual activity: Not Currently    Partners: Male    Birth control/protection: Surgical  Other Topics Concern   Not on file  Social History Narrative   Lives alone.    Social Determinants of Health   Financial Resource Strain: Medium Risk (08/04/2021)   Overall Financial Resource Strain (CARDIA)    Difficulty of Paying Living Expenses: Somewhat hard  Food Insecurity: Food Insecurity Present (08/04/2021)   Hunger Vital Sign    Worried About Running Out of Food in the Last Year: Sometimes true    Ran Out of Food in the Last Year: Sometimes true  Transportation Needs: No Transportation Needs (07/27/2021)   PRAPARE - Hydrologist (Medical): No    Lack of Transportation (Non-Medical): No  Physical Activity:  Insufficiently Active (07/27/2021)   Exercise Vital Sign    Days of Exercise per Week: 4 days    Minutes of Exercise per Session: 30 min  Stress: No Stress Concern Present (07/27/2021)   [place]    Feeling of Stress : Not at all  Social Connections: Moderately Integrated (07/27/2021)   Social Connection and Isolation Panel [NHANES]    Frequency of Communication with Friends and Family: More than  three times a week    Frequency of Social Gatherings with Friends and Family: More than three times a week    Attends Religious Services: More than 4 times per year    Active Member of [employer] or Organizations: Yes    Attends Music therapist: More than 4 times per year    Marital Status: Divorced  Intimate Partner Violence: Not At Risk (07/27/2021)   Humiliation, Afraid, Rape, and Kick questionnaire    Fear of Current or Ex-Partner: No    Emotionally Abused: No    Physically Abused: No    Sexually Abused: No    Family History  Problem Relation Age of Onset   Heart disease Mother    Alzheimer's disease Mother    Diabetes Mother    Other Father        deceased age 15 from some sort of GI problems but no malignancy   Diabetes Sister    Asthma Sister    Colon polyps Sister    Leukemia Brother        CLL   Diabetes Brother    Diabetes Daughter    Myasthenia gravis Daughter    Pulmonary embolism Daughter    Healthy Son    Healthy Son    Cancer Cousin        pancreatic   Cancer Cousin        pancreatic   Colon cancer Neg Hx    Stomach cancer Neg Hx    Esophageal cancer Neg Hx    Rectal cancer Neg Hx     Review of Systems:  As stated in the HPI and otherwise negative.   There were no vitals taken for this visit.  Physical Examination: General: Well developed, well nourished, NAD  HEENT: OP clear, mucus membranes moist  SKIN: warm, dry. No rashes. Neuro: No focal deficits  Musculoskeletal: Muscle  strength 5/5 all ext  Psychiatric: Mood and affect normal  Neck: No JVD, no carotid bruits, no thyromegaly, no lymphadenopathy.  Lungs:Clear bilaterally, no wheezes, rhonci, crackles Cardiovascular: Regular rate and rhythm. No murmurs, gallops or rubs. Abdomen:Soft. Bowel sounds present. Non-tender.  Extremities: No lower extremity edema. Pulses are 2 + in the bilateral DP/PT.  EKG:  EKG is *** ordered today. The ekg ordered today demonstrates   Echo September 2022:  1. Left ventricular ejection fraction, by estimation, is 55 to 60%. Left  ventricular ejection fraction by 3D volume is 58 %. The left ventricle has  normal function. There is mild concentric left ventricular hypertrophy.  Left ventricular diastolic  parameters are consistent with Grade I diastolic dysfunction (impaired  relaxation). The average left ventricular global longitudinal strain is  -21.6 %. The global longitudinal strain is normal.   2. Right ventricular systolic function is normal. The right ventricular  size is normal. Tricuspid regurgitation signal is inadequate for assessing  PA pressure.   3. The mitral valve is normal in structure. Trivial mitral valve  regurgitation. No evidence of mitral stenosis.   4. Suggest limited study with definity contrast to better   5. Mild tricuspid stenosis.   6. The aortic valve is normal in structure. Aortic valve regurgitation is  not visualized. No aortic stenosis is present.   7. The inferior vena cava is normal in size with greater than 50%  respiratory variability, suggesting right atrial pressure of 3 mmHg.   Recent Labs: 05/04/2021: ALT 13; BUN 15; Creatinine, Ser 0.97; Hemoglobin 13.1; Platelets 278; Potassium 4.5; Sodium 145; TSH  2.020   Lipid Panel    Component Value Date/Time   CHOL 278 (H) 05/04/2021 1104   TRIG 149 05/04/2021 1104   TRIG 79 02/17/2016 0817   HDL 53 05/04/2021 1104   HDL 53 02/17/2016 0817   CHOLHDL 5.2 (H) 05/04/2021 1104   CHOLHDL  3.5 06/11/2007 0450   VLDL 20 06/11/2007 0450   LDLCALC 198 (H) 05/04/2021 1104   LDLCALC 171 (H) 08/25/2013 0810     Wt Readings from Last 3 Encounters:  12/13/21 83.7 kg  08/30/21 84.2 kg  07/27/21 83.9 kg    Assessment and Plan:   1. CAD without angina: Mild CAD noted on coronary CTA in 2022. She is tolerating low dose Crestor. Continue ASA and Crestor.    2. HTN: BP is controlled. No changes  3. HLD: *** ? Tolerating Crestor.   Labs/ tests ordered today include:  No orders of the defined types were placed in this encounter.  Disposition:   F/U with me in 12 months  Signed, Lauree Chandler, MD 03/14/2022 11:54 AM    [facility] [place], [place], [place]  [address] Phone: [phone]; Fax: [phone]

## 2022-03-15 ENCOUNTER — Encounter: Payer: Self-pay | Admitting: Cardiovascular Disease

## 2022-03-15 ENCOUNTER — Ambulatory Visit: Payer: Medicare HMO | Attending: Cardiovascular Disease | Admitting: Cardiovascular Disease

## 2022-03-15 VITALS — BP 126/72 | HR 63 | Ht 64.0 in | Wt 185.0 lb

## 2022-03-15 DIAGNOSIS — I10 Essential (primary) hypertension: Secondary | ICD-10-CM | POA: Diagnosis not present

## 2022-03-15 DIAGNOSIS — I2510 Atherosclerotic heart disease of native coronary artery without angina pectoris: Secondary | ICD-10-CM

## 2022-03-15 DIAGNOSIS — E782 Mixed hyperlipidemia: Secondary | ICD-10-CM

## 2022-03-15 NOTE — Patient Instructions (Signed)
Medication Instructions:  No changes *If you need a refill on your cardiac medications before your next appointment, please call your pharmacy*   Lab Work: Today: lipids/liver   Testing/Procedures: None   Follow-Up: At [facility], you and your health needs are our priority.  As part of our continuing mission to provide you with exceptional heart care, we have created designated Provider Care Teams.  These Care Teams include your primary Cardiologist (physician) and Advanced Practice Providers (APPs -  Physician Assistants and Nurse Practitioners) who all work together to provide you with the care you need, when you need it.   Your next appointment:   12 month(s)  Provider:   Lauree Chandler, MD

## 2022-03-16 LAB — HEPATIC FUNCTION PANEL
ALT: 20 IU/L (ref 0–32)
AST: 17 IU/L (ref 0–40)
Albumin: 4.3 g/dL (ref 3.9–4.9)
Alkaline Phosphatase: 97 IU/L (ref 44–121)
Bilirubin Total: 0.4 mg/dL (ref 0.0–1.2)
Bilirubin, Direct: 0.11 mg/dL (ref 0.00–0.40)
Total Protein: 6.4 g/dL (ref 6.0–8.5)

## 2022-03-16 LAB — LIPID PANEL
Chol/HDL Ratio: 2.9 ratio (ref 0.0–4.4)
Cholesterol, Total: 166 mg/dL (ref 100–199)
HDL: 57 mg/dL (ref >39)
LDL Chol Calc (NIH): 95 mg/dL (ref 0–99)
Triglycerides: 76 mg/dL (ref 0–149)
VLDL Cholesterol Cal: 14 mg/dL (ref 5–40)

## 2022-03-20 ENCOUNTER — Telehealth: Payer: Self-pay | Admitting: Cardiovascular Disease

## 2022-03-20 NOTE — Telephone Encounter (Signed)
Left message to call the clinic.

## 2022-03-20 NOTE — Telephone Encounter (Signed)
Pt is returning call in regards to results. Requesting returning call.

## 2022-03-22 NOTE — Telephone Encounter (Signed)
Returned call to patient and adv of lab results and recommendation to continue same medications.

## 2022-03-24 ENCOUNTER — Ambulatory Visit: Payer: Medicare HMO | Admitting: Cardiovascular Disease

## 2022-03-28 DIAGNOSIS — H04123 Dry eye syndrome of bilateral lacrimal glands: Secondary | ICD-10-CM | POA: Diagnosis not present

## 2022-03-28 DIAGNOSIS — H52203 Unspecified astigmatism, bilateral: Secondary | ICD-10-CM | POA: Diagnosis not present

## 2022-04-18 ENCOUNTER — Encounter: Payer: Self-pay | Admitting: Family Medicine

## 2022-04-18 ENCOUNTER — Ambulatory Visit (INDEPENDENT_AMBULATORY_CARE_PROVIDER_SITE_OTHER): Payer: Medicare HMO | Admitting: Family Medicine

## 2022-04-18 VITALS — BP 127/83 | HR 74 | Temp 97.3°F | Ht 64.0 in | Wt 187.4 lb

## 2022-04-18 DIAGNOSIS — S1096XA Insect bite of unspecified part of neck, initial encounter: Secondary | ICD-10-CM

## 2022-04-18 DIAGNOSIS — W57XXXA Bitten or stung by nonvenomous insect and other nonvenomous arthropods, initial encounter: Secondary | ICD-10-CM | POA: Diagnosis not present

## 2022-04-18 MED ORDER — TRIAMCINOLONE ACETONIDE 0.1 % EX CREA: 1.0000 | TOPICAL_CREAM | Freq: Two times a day (BID) | CUTANEOUS | 0 refills | Status: DC

## 2022-04-18 MED ORDER — DOXYCYCLINE HYCLATE 100 MG PO TABS: 100.0000 mg | ORAL_TABLET | Freq: Two times a day (BID) | ORAL | 0 refills | Status: AC

## 2022-04-18 NOTE — Progress Notes (Signed)
Subjective:  Patient ID: Leah [name], female    DOB: 1950/08/09, 72 y.o.   MRN: [id number]  Patient Care Team: Janora Norlander, DO as PCP - General (Family Medicine) Burnell Blanks, MD as PCP - Cardiology (Cardiology) Milus Banister, MD as Attending Physician (Gastroenterology) Alyson Ingles Candee Furbish, MD as Consulting Physician (Urology) Netta Cedars, MD as Consulting Physician (Orthopedic Surgery) Jola Schmidt, MD as Consulting Physician (Ophthalmology)   Chief Complaint:  Tick Removal (Left side of neck x 3 days /)   HPI: Leah [name] is a 72 y.o. female presenting on 04/18/2022 for Tick Removal (Left side of neck x 3 days /)   Pt presents today for evaluation after removing an embedded tick from her left lateral neck. States she removed it 3 days ago, unsure how long it was embedded. She was able to remove the entire tick. She now has a red rash and irritation. No other associated symptoms.         Relevant past medical, surgical, family, and social history reviewed and updated as indicated.  Allergies and medications reviewed and updated. Data reviewed: Chart in Epic.   Past Medical History:  Diagnosis Date   Allergy    Allergy to alpha-gal    Anxiety    Arthritis    Asthma    CAD (coronary artery disease)    Colon polyp    COVID-19 virus infection 05/2020   Depression    Elevated CK    Food allergy    GERD (gastroesophageal reflux disease)    Hyperlipidemia    Hypertension    Mild tricuspid stenosis    Renal cell carcinoma    Urticaria     Past Surgical History:  Procedure Laterality Date   ABDOMINAL HYSTERECTOMY     Carpel Tunnel Right    CHOLECYSTECTOMY     COLONOSCOPY  02/08/2005   [id number] hemorrhoids otherwise normal rectum/ A few scattered, shallow, left-sided sigmoid diverticula/The remainder of the colonic mucosa appeared normal   COLONOSCOPY WITH ESOPHAGOGASTRODUODENOSCOPY (EGD) N/A 11/29/2012   Procedure:  COLONOSCOPY WITH ESOPHAGOGASTRODUODENOSCOPY (EGD);  Surgeon: Daneil Dolin, MD;  Location[facility];  Service: Endoscopy;  Laterality: N/A;  10:45   HERNIA REPAIR     ventral X 2 with mesh, last one 2011 ([place]). complicated with 2 week hospital stay   [place]     with mesh   NISSEN FUNDOPLICATION  0000000   POLYPECTOMY     ROTATOR CUFF REPAIR Left 04/04/2019   TUBAL LIGATION      Social History   Socioeconomic History   Marital status: Divorced    Spouse name: Not on file   Number of children: 3   Years of education: 12+   Highest education level: Some college, no degree  Occupational History   Occupation: private duty Manufacturing systems engineer  Tobacco Use   Smoking status: Former    Packs/day: 0.50    Years: 28.00    Additional pack years: 0.00    Total pack years: 14.00    Types: Cigarettes    Quit date: 01/17/1996    Years since quitting: 26.2   Smokeless tobacco: Never  Vaping Use   Vaping Use: Never used  Substance and Sexual Activity   Alcohol use: Yes    Alcohol/week: 2.0 standard drinks of alcohol    Types: 2 Glasses of wine per week    Comment: occasional use of wine    Drug use: No   Sexual  activity: Not Currently    Partners: Male    Birth control/protection: Surgical  Other Topics Concern   Not on file  Social History Narrative   Lives alone.    Social Determinants of Health   Financial Resource Strain: Medium Risk (08/04/2021)   Overall Financial Resource Strain (CARDIA)    Difficulty of Paying Living Expenses: Somewhat hard  Food Insecurity: Food Insecurity Present (08/04/2021)   Hunger Vital Sign    Worried About Running Out of Food in the Last Year: Sometimes true    Ran Out of Food in the Last Year: Sometimes true  Transportation Needs: No Transportation Needs (07/27/2021)   PRAPARE - Hydrologist (Medical): No    Lack of Transportation (Non-Medical): No  Physical Activity: Insufficiently Active (07/27/2021)    Exercise Vital Sign    Days of Exercise per Week: 4 days    Minutes of Exercise per Session: 30 min  Stress: No Stress Concern Present (07/27/2021)   [place]    Feeling of Stress : Not at all  Social Connections: Moderately Integrated (07/27/2021)   Social Connection and Isolation Panel [NHANES]    Frequency of Communication with Friends and Family: More than three times a week    Frequency of Social Gatherings with Friends and Family: More than three times a week    Attends Religious Services: More than 4 times per year    Active Member of [employer] or Organizations: Yes    Attends Music therapist: More than 4 times per year    Marital Status: Divorced  Intimate Partner Violence: Not At Risk (07/27/2021)   Humiliation, Afraid, Rape, and Kick questionnaire    Fear of Current or Ex-Partner: No    Emotionally Abused: No    Physically Abused: No    Sexually Abused: No    Outpatient Encounter Medications as of 04/18/2022  Medication Sig   albuterol (PROAIR HFA) 108 (90 Base) MCG/ACT inhaler Inhale 2 puffs into the lungs every 4 (four) hours as needed for wheezing or shortness of breath.   Albuterol Sulfate 2.5 MG/0.5ML NEBU Take 0.5 mLs (2.5 mg total) by nebulization every 6 (six) hours as needed.   aspirin 81 MG tablet Take 81 mg by mouth daily.   calcium carbonate (TUMS) 500 MG chewable tablet Chew 1 tablet (200 mg of elemental calcium total) by mouth as needed for indigestion or heartburn.   Cholecalciferol (VITAMIN D) 2000 UNITS tablet Take 2,000 Units by mouth daily.   doxycycline (VIBRA-TABS) 100 MG tablet Take 1 tablet (100 mg total) by mouth 2 (two) times daily for 14 days. 1 po bid   EPINEPHrine 0.3 mg/0.3 mL IJ SOAJ injection Inject into the muscle once.   famotidine (PEPCID) 40 MG tablet Take 1 tablet (40 mg total) by mouth daily.   fish oil-omega-3 fatty acids 1000 MG capsule Take 2 g by mouth daily.     fluticasone (FLONASE) 50 MCG/ACT nasal spray Place 2 sprays into both nostrils daily.   losartan-hydrochlorothiazide (HYZAAR) 100-12.5 MG tablet Take 1 tablet by mouth daily.   metFORMIN (GLUCOPHAGE-XR) 500 MG 24 hr tablet TAKE ONE TABLET BY MOUTH DAILY WITH BREAKFAST   Multiple Vitamins-Minerals (EMERGEN-C IMMUNE PO) Take by mouth.   Polyvinyl Alcohol-Povidone (REFRESH OP) Apply to eye.   rosuvastatin (CRESTOR) 10 MG tablet Take 1 tablet (10 mg total) by mouth daily.   triamcinolone cream (KENALOG) 0.1 % Apply 1 Application topically 2 (  two) times daily.   No facility-administered encounter medications on file as of 04/18/2022.    Allergies  Allergen Reactions   Beef Allergy Anaphylaxis   Pork Allergy Anaphylaxis and Other (See Comments)   Beef (Bovine) Protein Other (See Comments)   Codeine Nausea And Vomiting   Levaquin [Levofloxacin] Other (See Comments)    Achilles tendon pain   Meat [Alpha-Gal]    Penicillins Other (See Comments)    Does not know   Praluent [Alirocumab]     Rash and myalgias   Strawberry (Diagnostic)    Strawberry Extract Other (See Comments)    Review of Systems  Constitutional:  Negative for activity change, appetite change, chills, fatigue and fever.  HENT: Negative.    Eyes: Negative.   Respiratory:  Negative for cough, chest tightness and shortness of breath.   Cardiovascular:  Negative for chest pain, palpitations and leg swelling.  Gastrointestinal:  Negative for abdominal pain, blood in stool, constipation, diarrhea, nausea and vomiting.  Endocrine: Negative.   Genitourinary:  Negative for dysuria, frequency and urgency.  Musculoskeletal:  Negative for arthralgias, back pain, gait problem, joint swelling, myalgias, neck pain and neck stiffness.  Skin:  Positive for color change, rash and wound. Negative for pallor.  Allergic/Immunologic: Negative.   Neurological:  Negative for dizziness, tremors, seizures, syncope, speech difficulty, weakness,  light-headedness, numbness and headaches.  Hematological: Negative.  Negative for adenopathy. Does not bruise/bleed easily.  Psychiatric/Behavioral:  Negative for confusion, hallucinations, sleep disturbance and suicidal ideas.   All other systems reviewed and are negative.       Objective:  BP 127/83   Pulse 74   Temp (!) 97.3 F (36.3 C) (Temporal)   Ht 5\' 4"  (1.626 m)   Wt 187 lb 6.4 oz (85 kg)   SpO2 98%   BMI 32.17 kg/m    Wt Readings from Last 3 Encounters:  04/18/22 187 lb 6.4 oz (85 kg)  03/15/22 185 lb (83.9 kg)  12/13/21 184 lb 9.6 oz (83.7 kg)    Physical Exam Vitals and nursing note reviewed.  Constitutional:      General: She is not in acute distress.    Appearance: Normal appearance. She is well-developed and well-groomed. She is obese. She is not ill-appearing, toxic-appearing or diaphoretic.  HENT:     Head: Normocephalic and atraumatic.     Jaw: There is normal jaw occlusion.     Right Ear: Hearing normal.     Left Ear: Hearing normal.     Nose: Nose normal.     Mouth/Throat:     Lips: Pink.     Mouth: Mucous membranes are moist.     Pharynx: Uvula midline.  Eyes:     General: Lids are normal.     Conjunctiva/sclera: Conjunctivae normal.     Pupils: Pupils are equal, round, and reactive to light.  Neck:     Thyroid: No thyroid mass, thyromegaly or thyroid tenderness.     Vascular: No carotid bruit or JVD.     Trachea: Trachea and phonation normal.  Cardiovascular:     Rate and Rhythm: Normal rate and regular rhythm.     Chest Wall: PMI is not displaced.     Pulses: Normal pulses.     Heart sounds: Normal heart sounds. No murmur heard.    No friction rub. No gallop.  Pulmonary:     Effort: Pulmonary effort is normal.     Breath sounds: Normal breath sounds.  Abdominal:  General: There is no abdominal bruit.     Palpations: There is no hepatomegaly or splenomegaly.  Musculoskeletal:        General: Normal range of motion.     Cervical  back: Normal range of motion and neck supple.     Right lower leg: No edema.     Left lower leg: No edema.  Lymphadenopathy:     Cervical: No cervical adenopathy.  Skin:    General: Skin is warm and dry.     Capillary Refill: Capillary refill takes less than 2 seconds.     Coloration: Skin is not cyanotic, jaundiced or pale.     Findings: Erythema and rash present. Rash is nodular.       Neurological:     General: No focal deficit present.     Mental Status: She is alert and oriented to person, place, and time.     Sensory: Sensation is intact.     Motor: Motor function is intact.     Coordination: Coordination is intact.     Gait: Gait is intact.     Deep Tendon Reflexes: Reflexes are normal and symmetric.  Psychiatric:        Attention and Perception: Attention and perception normal.        Mood and Affect: Mood and affect normal.        Speech: Speech normal.        Behavior: Behavior normal. Behavior is cooperative.        Thought Content: Thought content normal.        Cognition and Memory: Cognition and memory normal.        Judgment: Judgment normal.     Results for orders placed or performed in visit on 03/15/22  Lipid panel  Result Value Ref Range   Cholesterol, Total 166 100 - 199 mg/dL   Triglycerides 76 0 - 149 mg/dL   HDL 57 >39 mg/dL   VLDL Cholesterol Cal 14 5 - 40 mg/dL   LDL Chol Calc (NIH) 95 0 - 99 mg/dL   Chol/HDL Ratio 2.9 0.0 - 4.4 ratio  Hepatic function panel  Result Value Ref Range   Total Protein 6.4 6.0 - 8.5 g/dL   Albumin 4.3 3.9 - 4.9 g/dL   Bilirubin Total 0.4 0.0 - 1.2 mg/dL   Bilirubin, Direct 0.11 0.00 - 0.40 mg/dL   Alkaline Phosphatase 97 44 - 121 IU/L   AST 17 0 - 40 IU/L   ALT 20 0 - 32 IU/L       Pertinent labs & imaging results that were available during my care of the patient were reviewed by me and considered in my medical decision making.  Assessment & Plan:  [place] was seen today for tick removal.  Diagnoses and all  orders for this visit:  Tick bite of neck, initial encounter Will initiate empiric doxycycline for potential tickborne illnesses. Triamcinolone cream for pruritus. Report new, worsening, or persistent symptoms.  -     triamcinolone cream (KENALOG) 0.1 %; Apply 1 Application topically 2 (two) times daily. -     doxycycline (VIBRA-TABS) 100 MG tablet; Take 1 tablet (100 mg total) by mouth 2 (two) times daily for 14 days. 1 po bid     Continue all other maintenance medications.  Follow up plan: Return if symptoms worsen or fail to improve.   Continue healthy lifestyle choices, including diet (rich in fruits, vegetables, and lean proteins, and low in salt and simple carbohydrates) and exercise (  at least 30 minutes of moderate physical activity daily).  Educational handout given for tick bite  The above assessment and management plan was discussed with the patient. The patient verbalized understanding of and has agreed to the management plan. Patient is aware to call the clinic if they develop any new symptoms or if symptoms persist or worsen. Patient is aware when to return to the clinic for a follow-up visit. Patient educated on when it is appropriate to go to the emergency department.   Monia Pouch, FNP-C [facility] [phone]

## 2022-04-26 ENCOUNTER — Encounter: Payer: Self-pay | Admitting: Family Medicine

## 2022-04-26 ENCOUNTER — Ambulatory Visit: Payer: Medicare HMO | Admitting: Family Medicine

## 2022-04-26 VITALS — BP 145/88 | HR 80 | Temp 98.3°F | Ht 64.0 in | Wt 183.6 lb

## 2022-04-26 DIAGNOSIS — I2510 Atherosclerotic heart disease of native coronary artery without angina pectoris: Secondary | ICD-10-CM

## 2022-04-26 DIAGNOSIS — R739 Hyperglycemia, unspecified: Secondary | ICD-10-CM | POA: Diagnosis not present

## 2022-04-26 DIAGNOSIS — M5442 Lumbago with sciatica, left side: Secondary | ICD-10-CM

## 2022-04-26 DIAGNOSIS — Z780 Asymptomatic menopausal state: Secondary | ICD-10-CM | POA: Diagnosis not present

## 2022-04-26 DIAGNOSIS — E782 Mixed hyperlipidemia: Secondary | ICD-10-CM | POA: Diagnosis not present

## 2022-04-26 DIAGNOSIS — E669 Obesity, unspecified: Secondary | ICD-10-CM

## 2022-04-26 DIAGNOSIS — G8929 Other chronic pain: Secondary | ICD-10-CM

## 2022-04-26 DIAGNOSIS — E66811 Obesity, class 1: Secondary | ICD-10-CM

## 2022-04-26 DIAGNOSIS — I10 Essential (primary) hypertension: Secondary | ICD-10-CM

## 2022-04-26 DIAGNOSIS — Z0001 Encounter for general adult medical examination with abnormal findings: Secondary | ICD-10-CM

## 2022-04-26 DIAGNOSIS — Z0000 Encounter for general adult medical examination without abnormal findings: Secondary | ICD-10-CM

## 2022-04-26 DIAGNOSIS — N2889 Other specified disorders of kidney and ureter: Secondary | ICD-10-CM

## 2022-04-26 LAB — BAYER DCA HB A1C WAIVED: HB A1C (BAYER DCA - WAIVED): 6.1 % — ABNORMAL HIGH (ref 4.8–5.6)

## 2022-04-26 NOTE — Progress Notes (Signed)
Leah [name] is a 72 y.o. female presents to office today for annual physical exam examination.    Concerns today include: 1. ***  Occupation: ***, Marital status: ***, Substance use: *** Diet: ***, Exercise: *** Last colonoscopy: *** Last mammogram: *** Last pap smear: *** Refills needed today: *** Immunizations needed: Immunization History  Administered Date(s) Administered   Fluad Quad(high Dose 65+) 12/11/2018, 10/28/2019, 10/29/2020, 11/14/2021   Influenza,inj,Quad PF,6+ Mos 10/08/2012, 11/26/2013, 10/22/2014, 10/27/2015, 10/18/2016   Influenza,inj,quad, With Preservative 12/11/2018   Influenza-Unspecified 11/10/2016   Moderna Covid-19 Vaccine Bivalent Booster 66yrs & up 11/09/2020   Moderna Sars-Covid-2 Vaccination 02/21/2019, 03/22/2019, 11/25/2019   PPD Test 12/21/2020   Pneumococcal Conjugate-13 12/18/2018   Pneumococcal Polysaccharide-23 10/17/2011, 04/05/2020   Td 02/16/2016   Tdap 02/16/2016     Past Medical History:  Diagnosis Date   Allergy    Allergy to alpha-gal    Anxiety    Arthritis    Asthma    CAD (coronary artery disease)    Colon polyp    COVID-19 virus infection 05/2020   Depression    Elevated CK    Food allergy    GERD (gastroesophageal reflux disease)    Hyperlipidemia    Hypertension    Mild tricuspid stenosis    Renal cell carcinoma    Urticaria    Social History   Socioeconomic History   Marital status: Divorced    Spouse name: Not on file   Number of children: 3   Years of education: 12+   Highest education level: Some college, no degree  Occupational History   Occupation: private duty Engineer, manufacturing systems  Tobacco Use   Smoking status: Former    Packs/day: 0.50    Years: 28.00    Additional pack years: 0.00    Total pack years: 14.00    Types: Cigarettes    Quit date: 01/17/1996    Years since quitting: 26.2   Smokeless tobacco: Never  Vaping Use   Vaping Use: Never used  Substance and Sexual Activity   Alcohol  use: Yes    Alcohol/week: 2.0 standard drinks of alcohol    Types: 2 Glasses of wine per week    Comment: occasional use of wine    Drug use: No   Sexual activity: Not Currently    Partners: Male    Birth control/protection: Surgical  Other Topics Concern   Not on file  Social History Narrative   Lives alone.    Social Determinants of Health   Financial Resource Strain: Medium Risk (08/04/2021)   Overall Financial Resource Strain (CARDIA)    Difficulty of Paying Living Expenses: Somewhat hard  Food Insecurity: Food Insecurity Present (08/04/2021)   Hunger Vital Sign    Worried About Running Out of Food in the Last Year: Sometimes true    Ran Out of Food in the Last Year: Sometimes true  Transportation Needs: No Transportation Needs (07/27/2021)   PRAPARE - Administrator, Civil Service (Medical): No    Lack of Transportation (Non-Medical): No  Physical Activity: Insufficiently Active (07/27/2021)   Exercise Vital Sign    Days of Exercise per Week: 4 days    Minutes of Exercise per Session: 30 min  Stress: No Stress Concern Present (07/27/2021)   [employer] of Occupational Health - Occupational Stress Questionnaire    Feeling of Stress : Not at all  Social Connections: Moderately Integrated (07/27/2021)   Social Connection and Isolation Panel [NHANES]    Frequency  of Communication with Friends and Family: More than three times a week    Frequency of Social Gatherings with Friends and Family: More than three times a week    Attends Religious Services: More than 4 times per year    Active Member of [employer] or Organizations: Yes    Attends Engineer, structural: More than 4 times per year    Marital Status: Divorced  Intimate Partner Violence: Not At Risk (07/27/2021)   Humiliation, Afraid, Rape, and Kick questionnaire    Fear of Current or Ex-Partner: No    Emotionally Abused: No    Physically Abused: No    Sexually Abused: No   Past Surgical History:   Procedure Laterality Date   ABDOMINAL HYSTERECTOMY     Carpel Tunnel Right    CHOLECYSTECTOMY     COLONOSCOPY  02/08/2005   [id number] hemorrhoids otherwise normal rectum/ A few scattered, shallow, left-sided sigmoid diverticula/The remainder of the colonic mucosa appeared normal   COLONOSCOPY WITH ESOPHAGOGASTRODUODENOSCOPY (EGD) N/A 11/29/2012   Procedure: COLONOSCOPY WITH ESOPHAGOGASTRODUODENOSCOPY (EGD);  Surgeon: Corbin Ade, MD;  [facility];  Service: Endoscopy;  Laterality: N/A;  10:45   HERNIA REPAIR     ventral X 2 with mesh, last one 2011 ([place]). complicated with 2 week hospital stay   HERNIA REPAIR     with mesh   NISSEN FUNDOPLICATION  1992   POLYPECTOMY     ROTATOR CUFF REPAIR Left 04/04/2019   TUBAL LIGATION     Family History  Problem Relation Age of Onset   Heart disease Mother    Alzheimer's disease Mother    Diabetes Mother    Other Father        deceased age 86 from some sort of GI problems but no malignancy   Diabetes Sister    Asthma Sister    Colon polyps Sister    Leukemia Brother        CLL   Diabetes Brother    Diabetes Daughter    Myasthenia gravis Daughter    Pulmonary embolism Daughter    Healthy Son    Healthy Son    Cancer Cousin        pancreatic   Cancer Cousin        pancreatic   Colon cancer Neg Hx    Stomach cancer Neg Hx    Esophageal cancer Neg Hx    Rectal cancer Neg Hx     Current Outpatient Medications:    albuterol (PROAIR HFA) 108 (90 Base) MCG/ACT inhaler, Inhale 2 puffs into the lungs every 4 (four) hours as needed for wheezing or shortness of breath., Disp: 8.5 g, Rfl: 3   Albuterol Sulfate 2.5 MG/0.5ML NEBU, Take 0.5 mLs (2.5 mg total) by nebulization every 6 (six) hours as needed., Disp: 20 mL, Rfl: 3   aspirin 81 MG tablet, Take 81 mg by mouth daily., Disp: , Rfl:    calcium carbonate (TUMS) 500 MG chewable tablet, Chew 1 tablet (200 mg of elemental calcium total) by mouth as needed for  indigestion or heartburn., Disp: , Rfl:    Cholecalciferol (VITAMIN D) 2000 UNITS tablet, Take 2,000 Units by mouth daily., Disp: , Rfl:    doxycycline (VIBRA-TABS) 100 MG tablet, Take 1 tablet (100 mg total) by mouth 2 (two) times daily for 14 days. 1 po bid, Disp: 28 tablet, Rfl: 0   EPINEPHrine 0.3 mg/0.3 mL IJ SOAJ injection, Inject into the muscle once., Disp: , Rfl:  famotidine (PEPCID) 40 MG tablet, Take 1 tablet (40 mg total) by mouth daily., Disp: 90 tablet, Rfl: 0   fish oil-omega-3 fatty acids 1000 MG capsule, Take 2 g by mouth daily. , Disp: , Rfl:    fluticasone (FLONASE) 50 MCG/ACT nasal spray, Place 2 sprays into both nostrils daily., Disp: 16 g, Rfl: 6   losartan-hydrochlorothiazide (HYZAAR) 100-12.5 MG tablet, Take 1 tablet by mouth daily., Disp: 90 tablet, Rfl: 0   metFORMIN (GLUCOPHAGE-XR) 500 MG 24 hr tablet, TAKE ONE TABLET BY MOUTH DAILY WITH BREAKFAST, Disp: 90 tablet, Rfl: 0   Multiple Vitamins-Minerals (EMERGEN-C IMMUNE PO), Take by mouth., Disp: , Rfl:    Polyvinyl Alcohol-Povidone (REFRESH OP), Apply to eye., Disp: , Rfl:    rosuvastatin (CRESTOR) 10 MG tablet, Take 1 tablet (10 mg total) by mouth daily., Disp: 90 tablet, Rfl: 3   triamcinolone cream (KENALOG) 0.1 %, Apply 1 Application topically 2 (two) times daily., Disp: 30 g, Rfl: 0  Allergies  Allergen Reactions   Beef Allergy Anaphylaxis   Pork Allergy Anaphylaxis and Other (See Comments)   Beef (Bovine) Protein Other (See Comments)   Codeine Nausea And Vomiting   Levaquin [Levofloxacin] Other (See Comments)    Achilles tendon pain   Meat [Alpha-Gal]    Penicillins Other (See Comments)    Does not know   Praluent [Alirocumab]     Rash and myalgias   Strawberry (Diagnostic)    Strawberry Extract Other (See Comments)     ROS: Review of Systems {ros; complete:30496}    Physical exam {Exam, Complete:[phone]}    Assessment/ Plan: Buel Reamorothy J Withey here for annual physical exam.   No  problem-specific Assessment & Plan notes found for this encounter.   Counseled on healthy lifestyle choices, including diet (rich in fruits, vegetables and lean meats and low in salt and simple carbohydrates) and exercise (at least 30 minutes of moderate physical activity daily).  Patient to follow up in 1 year for annual exam or sooner if needed.  Dalissa Lovin M. Nadine CountsGottschalk, DO

## 2022-04-27 ENCOUNTER — Other Ambulatory Visit: Payer: Self-pay | Admitting: Family Medicine

## 2022-04-27 DIAGNOSIS — E669 Obesity, unspecified: Secondary | ICD-10-CM

## 2022-04-27 DIAGNOSIS — E782 Mixed hyperlipidemia: Secondary | ICD-10-CM

## 2022-04-27 DIAGNOSIS — E66811 Obesity, class 1: Secondary | ICD-10-CM

## 2022-04-27 LAB — CBC
Hematocrit: 39 % (ref 34.0–46.6)
Hemoglobin: 12.7 g/dL (ref 11.1–15.9)
MCH: 27.6 pg (ref 26.6–33.0)
MCHC: 32.6 g/dL (ref 31.5–35.7)
MCV: 85 fL (ref 79–97)
Platelets: 262 10*3/uL (ref 150–450)
RBC: 4.6 x10E6/uL (ref 3.77–5.28)
RDW: 13.1 % (ref 11.7–15.4)
WBC: 2.6 10*3/uL — ABNORMAL LOW (ref 3.4–10.8)

## 2022-04-27 LAB — BASIC METABOLIC PANEL
BUN/Creatinine Ratio: 24 (ref 12–28)
BUN: 19 mg/dL (ref 8–27)
CO2: 25 mmol/L (ref 20–29)
Calcium: 9.3 mg/dL (ref 8.7–10.3)
Chloride: 102 mmol/L (ref 96–106)
Creatinine, Ser: 0.78 mg/dL (ref 0.57–1.00)
Glucose: 92 mg/dL (ref 70–99)
Potassium: 4.1 mmol/L (ref 3.5–5.2)
Sodium: 142 mmol/L (ref 134–144)
eGFR: 82 mL/min/{1.73_m2} (ref >59)

## 2022-04-27 LAB — TSH: TSH: 1.19 u[IU]/mL (ref 0.450–4.500)

## 2022-04-27 LAB — VITAMIN D 25 HYDROXY (VIT D DEFICIENCY, FRACTURES): Vit D, 25-Hydroxy: 61.6 ng/mL (ref 30.0–100.0)

## 2022-04-27 MED ORDER — ROSUVASTATIN CALCIUM 10 MG PO TABS: 10.0000 mg | ORAL_TABLET | Freq: Every day | ORAL | 3 refills | Status: DC

## 2022-04-27 MED ORDER — EPINEPHRINE 0.3 MG/0.3ML IJ SOAJ: 0.3000 mg | Freq: Once | INTRAMUSCULAR | 0 refills | Status: AC

## 2022-04-27 MED ORDER — METFORMIN HCL ER 500 MG PO TB24
500.0000 mg | ORAL_TABLET | Freq: Every day | ORAL | 3 refills | Status: DC
Start: 2022-04-27 — End: 2022-10-24

## 2022-04-27 MED ORDER — METHYLPREDNISOLONE ACETATE 40 MG/ML IJ SUSP
40.0000 mg | Freq: Once | INTRAMUSCULAR | Status: DC
Start: 2022-04-27 — End: 2023-01-26

## 2022-04-27 MED ORDER — LOSARTAN POTASSIUM-HCTZ 100-12.5 MG PO TABS: 1.0000 | ORAL_TABLET | Freq: Every day | ORAL | 3 refills | Status: DC

## 2022-04-27 MED ORDER — FAMOTIDINE 40 MG PO TABS: 40.0000 mg | ORAL_TABLET | Freq: Every day | ORAL | 3 refills | Status: DC

## 2022-05-08 ENCOUNTER — Ambulatory Visit: Payer: Medicare HMO

## 2022-05-09 ENCOUNTER — Ambulatory Visit (INDEPENDENT_AMBULATORY_CARE_PROVIDER_SITE_OTHER): Payer: Medicare HMO

## 2022-05-09 DIAGNOSIS — Z780 Asymptomatic menopausal state: Secondary | ICD-10-CM

## 2022-05-10 DIAGNOSIS — Z780 Asymptomatic menopausal state: Secondary | ICD-10-CM | POA: Diagnosis not present

## 2022-05-11 ENCOUNTER — Ambulatory Visit: Payer: Medicare HMO | Admitting: Physical Therapy

## 2022-05-18 ENCOUNTER — Other Ambulatory Visit: Payer: Self-pay

## 2022-05-18 ENCOUNTER — Ambulatory Visit: Payer: Medicare HMO | Attending: Family Medicine

## 2022-05-18 DIAGNOSIS — M5459 Other low back pain: Secondary | ICD-10-CM | POA: Insufficient documentation

## 2022-05-18 DIAGNOSIS — G8929 Other chronic pain: Secondary | ICD-10-CM | POA: Insufficient documentation

## 2022-05-18 DIAGNOSIS — M5442 Lumbago with sciatica, left side: Secondary | ICD-10-CM | POA: Diagnosis not present

## 2022-05-18 DIAGNOSIS — M25552 Pain in left hip: Secondary | ICD-10-CM | POA: Diagnosis not present

## 2022-05-18 NOTE — Therapy (Signed)
OUTPATIENT PHYSICAL THERAPY THORACOLUMBAR EVALUATION   Patient Name: Leah [name] MRN: [id number] DOB:April 13, 1950, 72 y.o., female Today's Date: 05/18/2022  END OF SESSION:  PT End of Session - 05/18/22 1037     Visit Number 1    Number of Visits 6    Date for PT Re-Evaluation 07/14/22    PT Start Time 1038    PT Stop Time 1115    PT Time Calculation (min) 37 min    Activity Tolerance Patient tolerated treatment well    Behavior During Therapy [facility] for tasks assessed/performed             Past Medical History:  Diagnosis Date   Allergy    Allergy to alpha-gal    Anxiety    Arthritis    Asthma    CAD (coronary artery disease)    Colon polyp    COVID-19 virus infection 05/2020   Depression    Elevated CK    Food allergy    GERD (gastroesophageal reflux disease)    Hyperlipidemia    Hypertension    Mild tricuspid stenosis    Renal cell carcinoma (HCC)    Urticaria    Past Surgical History:  Procedure Laterality Date   ABDOMINAL HYSTERECTOMY     Carpel Tunnel Right    CHOLECYSTECTOMY     COLONOSCOPY  02/08/2005   [id number] hemorrhoids otherwise normal rectum/ A few scattered, shallow, left-sided sigmoid diverticula/The remainder of the colonic mucosa appeared normal   COLONOSCOPY WITH ESOPHAGOGASTRODUODENOSCOPY (EGD) N/A 11/29/2012   Procedure: COLONOSCOPY WITH ESOPHAGOGASTRODUODENOSCOPY (EGD);  Surgeon: Corbin Ade, MD;  [facility];  Service: Endoscopy;  Laterality: N/A;  10:45   HERNIA REPAIR     ventral X 2 with mesh, last one 2011 ([place]). complicated with 2 week hospital stay   HERNIA REPAIR     with mesh   NISSEN FUNDOPLICATION  1992   POLYPECTOMY     ROTATOR CUFF REPAIR Left 04/04/2019   TUBAL LIGATION     Patient Active Problem List   Diagnosis Date Noted   Elevated CK 11/18/2020   Pain in right shoulder 11/18/2020   Muscle cramps 11/18/2020   Myalgia 11/18/2020   Renal mass 08/17/2020   Bloating 07/02/2020    Flatulence 07/02/2020   Generalized abdominal pain 07/02/2020   Pelvic pain 06/15/2020   Cough 06/07/2020   CAD (coronary artery disease)    Hypertension    Tear of medial meniscus of knee 05/20/2020   COVID-19 05/2020   Hip pain 10/28/2019   Acute pain of right knee 10/28/2019   Right lower quadrant abdominal pain 10/28/2019   Internal hemorrhoids 04/11/2019   Constipation 04/11/2019   Rectal bleeding 07/13/2017   Arthropathy of left shoulder 07/13/2017   Hypokalemia 02/20/2017   Chest pain 01/17/2017   Adjustment disorder with mixed anxiety and depressed mood 03/25/2013   Epigastric pain 11/12/2012   Nausea alone 11/12/2012   History of colonic polyps 11/12/2012   Hypertension, benign essential, goal below 140/90 10/30/2012   Hyperlipidemia 10/22/2012   GERD (gastroesophageal reflux disease) 10/22/2012   Asthma 10/22/2012   REFERRING PROVIDER: Raliegh Ip, DO   REFERRING DIAG: Chronic left-sided low back pain with left-sided sciatica   Rationale for Evaluation and Treatment: Rehabilitation  THERAPY DIAG:  Other low back pain  Pain in left hip  ONSET DATE: years ago   SUBJECTIVE:  SUBJECTIVE STATEMENT: Patient reports that her left low back has been hurting off and on for years, but it got worse in the last month. However, she has noticed that it has been feeling a little better this week. Her pain had been so bad that it was waking her up at night, but that has not happened this week. She has muscle spasms in her back which cause both sides of her back to hurt. She has noticed that these primarily happen at night while she is trying to sleep. Her pain typically gets worse throughout the day with the night being the worst time. She is still able to do everything she wants, but she has  pain while she does it.   PERTINENT HISTORY:  Allergies, hypertension, coronary artery disease, asthma, chronic neck pain, anxiety, osteoarthritis, and depression  PAIN:  Are you having pain? Yes: NPRS scale: 7/10 Pain location: left low back radiating to hamstring Pain description: aching Aggravating factors: busy days Relieving factors: medication  PRECAUTIONS: None  WEIGHT BEARING RESTRICTIONS: No  FALLS:  Has patient fallen in last 6 months? No  LIVING ENVIRONMENT: Lives with: lives alone Lives in: House/apartment Stairs: Yes: External: 3 steps; none Has following equipment at home: None  OCCUPATION: retired  PLOF: Independent  PATIENT GOALS: reduced pain   NEXT MD VISIT: None scheduled   OBJECTIVE:   SCREENING FOR RED FLAGS: Bowel or bladder incontinence: No Spinal tumors: No Cauda equina syndrome: No Compression fracture: No Abdominal aneurysm: No  COGNITION: Overall cognitive status: Within functional limits for tasks assessed     SENSATION: Patient reports no numbness or tingling  POSTURE: rounded shoulders and forward head  PALPATION: Reproduced familiar pain with palpation: left lumbar paraspinals, QL, gluteals, TFL, and IT band  LUMBAR ROM:   AROM eval  Flexion 78  Extension 18  Right lateral flexion No limitation  Left lateral flexion No limitation; familiar left low back pain  Right rotation No limitation   Left rotation No limitation   (Blank rows = not tested)  LOWER EXTREMITY ROM: WFL for activities assessed  LOWER EXTREMITY MMT:    MMT Right eval Left eval  Hip flexion 4/5 4/5  Hip extension    Hip abduction    Hip adduction    Hip internal rotation    Hip external rotation    Knee flexion 4+/5 4+/5  Knee extension 5/5 5/5  Ankle dorsiflexion 4+/5 4+/5  Ankle plantarflexion    Ankle inversion    Ankle eversion     (Blank rows = not tested)  GAIT: Assistive device utilized: None Level of assistance: Complete  Independence Comments: no significant gait deviations  TODAY'S TREATMENT:                                                                                                                              DATE:     PATIENT EDUCATION:  Education details: Plan of care, healing, prognosis, anatomy,  and goals for therapy Person educated: Patient Education method: Explanation Education comprehension: verbalized understanding  HOME EXERCISE PROGRAM:   ASSESSMENT:  CLINICAL IMPRESSION: Patient is a 72 y.o. female who was seen today for physical therapy evaluation and treatment for chronic low back and left hip pain. She presented with low pain severity and irritability with palpation to her left lumbar and hip musculature reproducing her familiar symptoms. Recommend that she continue with skilled physical therapy to address her impairments to return to her prior level of function.  OBJECTIVE IMPAIRMENTS: decreased strength, impaired tone, and pain.   ACTIVITY LIMITATIONS: lifting and locomotion level  PARTICIPATION LIMITATIONS: cleaning, community activity, and yard work  PERSONAL FACTORS: Time since onset of injury/illness/exacerbation and 3+ comorbidities: Allergies, hypertension, coronary artery disease, asthma, chronic neck pain, anxiety, osteoarthritis, and depression  are also affecting patient's functional outcome.   REHAB POTENTIAL: Good  CLINICAL DECISION MAKING: Evolving/moderate complexity  EVALUATION COMPLEXITY: Moderate   GOALS: Goals reviewed with patient? Yes  LONG TERM GOALS: Target date: 06/08/22  Patient will be independent with her HEP. Baseline:  Goal status: INITIAL  2.  Patient will be able to complete her daily activities without her familiar pain exceeding 5/10. Baseline:  Goal status: INITIAL  3.  Patient will be able to demonstrate left sidebending without reproducing her familiar pain for improved function with lifting. Baseline:  Goal status:  INITIAL  PLAN:  PT FREQUENCY: 1-2x/week  PT DURATION: 3 weeks  PLANNED INTERVENTIONS: Therapeutic exercises, Therapeutic activity, Neuromuscular re-education, Balance training, Patient/Family education, Self Care, Joint mobilization, Dry Needling, Electrical stimulation, Spinal mobilization, Cryotherapy, Moist heat, Traction, Manual therapy, and Re-evaluation.  PLAN FOR NEXT SESSION: NuStep, lower trunk rotations, bridges, manual therapy, and modalities as needed   Granville Lewis, PT 05/18/2022, 1:32 PM

## 2022-05-25 ENCOUNTER — Ambulatory Visit: Payer: Medicare HMO

## 2022-05-25 DIAGNOSIS — G8929 Other chronic pain: Secondary | ICD-10-CM | POA: Diagnosis not present

## 2022-05-25 DIAGNOSIS — M5459 Other low back pain: Secondary | ICD-10-CM

## 2022-05-25 DIAGNOSIS — M25552 Pain in left hip: Secondary | ICD-10-CM | POA: Diagnosis not present

## 2022-05-25 DIAGNOSIS — M5442 Lumbago with sciatica, left side: Secondary | ICD-10-CM | POA: Diagnosis not present

## 2022-05-25 NOTE — Therapy (Signed)
OUTPATIENT PHYSICAL THERAPY THORACOLUMBAR TREATMENT   Patient Name: Leah [name] MRN: [id number] DOB:03-13-1950, 72 y.o., female Today's Date: 05/25/2022  END OF SESSION:  PT End of Session - 05/25/22 1121     Visit Number 2    Number of Visits 6    Date for PT Re-Evaluation 07/14/22    PT Start Time 1115    PT Stop Time 1150    PT Time Calculation (min) 35 min    Activity Tolerance Patient tolerated treatment well    Behavior During Therapy [facility] for tasks assessed/performed             Past Medical History:  Diagnosis Date   Allergy    Allergy to alpha-gal    Anxiety    Arthritis    Asthma    CAD (coronary artery disease)    Colon polyp    COVID-19 virus infection 05/2020   Depression    Elevated CK    Food allergy    GERD (gastroesophageal reflux disease)    Hyperlipidemia    Hypertension    Mild tricuspid stenosis    Renal cell carcinoma (HCC)    Urticaria    Past Surgical History:  Procedure Laterality Date   ABDOMINAL HYSTERECTOMY     Carpel Tunnel Right    CHOLECYSTECTOMY     COLONOSCOPY  02/08/2005   [id number] hemorrhoids otherwise normal rectum/ A few scattered, shallow, left-sided sigmoid diverticula/The remainder of the colonic mucosa appeared normal   COLONOSCOPY WITH ESOPHAGOGASTRODUODENOSCOPY (EGD) N/A 11/29/2012   Procedure: COLONOSCOPY WITH ESOPHAGOGASTRODUODENOSCOPY (EGD);  Surgeon: Corbin Ade, MD;  [facility];  Service: Endoscopy;  Laterality: N/A;  10:45   HERNIA REPAIR     ventral X 2 with mesh, last one 2011 ([place]). complicated with 2 week hospital stay   HERNIA REPAIR     with mesh   NISSEN FUNDOPLICATION  1992   POLYPECTOMY     ROTATOR CUFF REPAIR Left 04/04/2019   TUBAL LIGATION     Patient Active Problem List   Diagnosis Date Noted   Elevated CK 11/18/2020   Pain in right shoulder 11/18/2020   Muscle cramps 11/18/2020   Myalgia 11/18/2020   Renal mass 08/17/2020   Bloating 07/02/2020    Flatulence 07/02/2020   Generalized abdominal pain 07/02/2020   Pelvic pain 06/15/2020   Cough 06/07/2020   CAD (coronary artery disease)    Hypertension    Tear of medial meniscus of knee 05/20/2020   COVID-19 05/2020   Hip pain 10/28/2019   Acute pain of right knee 10/28/2019   Right lower quadrant abdominal pain 10/28/2019   Internal hemorrhoids 04/11/2019   Constipation 04/11/2019   Rectal bleeding 07/13/2017   Arthropathy of left shoulder 07/13/2017   Hypokalemia 02/20/2017   Chest pain 01/17/2017   Adjustment disorder with mixed anxiety and depressed mood 03/25/2013   Epigastric pain 11/12/2012   Nausea alone 11/12/2012   History of colonic polyps 11/12/2012   Hypertension, benign essential, goal below 140/90 10/30/2012   Hyperlipidemia 10/22/2012   GERD (gastroesophageal reflux disease) 10/22/2012   Asthma 10/22/2012   REFERRING PROVIDER: Raliegh Ip, DO   REFERRING DIAG: Chronic left-sided low back pain with left-sided sciatica   Rationale for Evaluation and Treatment: Rehabilitation  THERAPY DIAG:  Other low back pain  ONSET DATE: years ago   SUBJECTIVE:  SUBJECTIVE STATEMENT: Pt reports that her back is feeling better since getting an injection.  PERTINENT HISTORY:  Allergies, hypertension, coronary artery disease, asthma, chronic neck pain, anxiety, osteoarthritis, and depression  PAIN:  Are you having pain? No  PRECAUTIONS: None  WEIGHT BEARING RESTRICTIONS: No  FALLS:  Has patient fallen in last 6 months? No  LIVING ENVIRONMENT: Lives with: lives alone Lives in: House/apartment Stairs: Yes: External: 3 steps; none Has following equipment at home: None  OCCUPATION: retired  PLOF: Independent  PATIENT GOALS: reduced pain   NEXT MD VISIT: None  scheduled   OBJECTIVE:   SCREENING FOR RED FLAGS: Bowel or bladder incontinence: No Spinal tumors: No Cauda equina syndrome: No Compression fracture: No Abdominal aneurysm: No  COGNITION: Overall cognitive status: Within functional limits for tasks assessed     SENSATION: Patient reports no numbness or tingling  POSTURE: rounded shoulders and forward head  PALPATION: Reproduced familiar pain with palpation: left lumbar paraspinals, QL, gluteals, TFL, and IT band  LUMBAR ROM:   AROM eval  Flexion 78  Extension 18  Right lateral flexion No limitation  Left lateral flexion No limitation; familiar left low back pain  Right rotation No limitation   Left rotation No limitation   (Blank rows = not tested)  LOWER EXTREMITY ROM: WFL for activities assessed  LOWER EXTREMITY MMT:    MMT Right eval Left eval  Hip flexion 4/5 4/5  Hip extension    Hip abduction    Hip adduction    Hip internal rotation    Hip external rotation    Knee flexion 4+/5 4+/5  Knee extension 5/5 5/5  Ankle dorsiflexion 4+/5 4+/5  Ankle plantarflexion    Ankle inversion    Ankle eversion     (Blank rows = not tested)  GAIT: Assistive device utilized: None Level of assistance: Complete Independence Comments: no significant gait deviations  TODAY'S TREATMENT:                                                                                                                              DATE:                                     EXERCISE LOG  Exercise Repetitions and Resistance Comments  Nustep Lvl 3 x 10 mins   [employer] 3 mins with 3 sec hold   Ball Rollout 3 mins with 3 sec hold   LAQs 3# x 20 reps bil   Seated Marches 3# x 20 reps bil   Clam Red x 2 mins   Ball Squeeze 2 mins   Ham Curls Red x 20 reps bil   STS     Blank cell = exercise not performed today  PATIENT EDUCATION:  Education details: Plan of care, healing, prognosis, anatomy, and goals for therapy Person educated:  Patient Education method: Explanation  Education comprehension: verbalized understanding  HOME EXERCISE PROGRAM:   ASSESSMENT:  CLINICAL IMPRESSION: Pt arrives for today's treatment session denying any pain.  Pt reports that since receiving the injection at her MD office her low back has been feeling much better.  Pt able to tolerate introduction to Nustep and seated exercises today with minimal low back soreness. Pt plans to call facility in the next few days to inform us if she would like to go on hold.  Pt denied any pain at completion of today's treatment session.   OBJECTIVE IMPAIRMENTS: decreased strength, impaired tone, and pain.   ACTIVITY LIMITATIONS: lifting and locomotion level  PARTICIPATION LIMITATIONS: cleaning, community activity, and yard work  PERSONAL FACTORS: Time since onset of injury/illness/exacerbation and 3+ comorbidities: Allergies, hypertension, coronary artery disease, asthma, chronic neck pain, anxiety, osteoarthritis, and depression  are also affecting patient's functional outcome.   REHAB POTENTIAL: Good  CLINICAL DECISION MAKING: Evolving/moderate complexity  EVALUATION COMPLEXITY: Moderate   GOALS: Goals reviewed with patient? Yes  LONG TERM GOALS: Target date: 06/08/22  Patient will be independent with her HEP. Baseline:  Goal status: INITIAL  2.  Patient will be able to complete her daily activities without her familiar pain exceeding 5/10. Baseline:  Goal status: INITIAL  3.  Patient will be able to demonstrate left sidebending without reproducing her familiar pain for improved function with lifting. Baseline:  Goal status: INITIAL  PLAN:  PT FREQUENCY: 1-2x/week  PT DURATION: 3 weeks  PLANNED INTERVENTIONS: Therapeutic exercises, Therapeutic activity, Neuromuscular re-education, Balance training, Patient/Family education, Self Care, Joint mobilization, Dry Needling, Electrical stimulation, Spinal mobilization, Cryotherapy, Moist  heat, Traction, Manual therapy, and Re-evaluation.  PLAN FOR NEXT SESSION: NuStep, lower trunk rotations, bridges, manual therapy, and modalities as needed   Newman Pies, PTA 05/25/2022, 11:56 AM

## 2022-07-19 DIAGNOSIS — C642 Malignant neoplasm of left kidney, except renal pelvis: Secondary | ICD-10-CM | POA: Diagnosis not present

## 2022-07-19 DIAGNOSIS — N2889 Other specified disorders of kidney and ureter: Secondary | ICD-10-CM | POA: Diagnosis not present

## 2022-07-19 DIAGNOSIS — Z0189 Encounter for other specified special examinations: Secondary | ICD-10-CM | POA: Diagnosis not present

## 2022-07-25 DIAGNOSIS — N2889 Other specified disorders of kidney and ureter: Secondary | ICD-10-CM | POA: Diagnosis not present

## 2022-08-11 ENCOUNTER — Ambulatory Visit (INDEPENDENT_AMBULATORY_CARE_PROVIDER_SITE_OTHER): Payer: Medicare HMO

## 2022-08-11 VITALS — Ht 64.0 in | Wt 182.0 lb

## 2022-08-11 DIAGNOSIS — Z1211 Encounter for screening for malignant neoplasm of colon: Secondary | ICD-10-CM | POA: Diagnosis not present

## 2022-08-11 DIAGNOSIS — Z0000 Encounter for general adult medical examination without abnormal findings: Secondary | ICD-10-CM | POA: Diagnosis not present

## 2022-08-11 NOTE — Progress Notes (Signed)
Subjective:   Leah [name] is a 72 y.o. female who presents for Medicare Annual (Subsequent) preventive examination.  Visit Complete: Virtual  I connected with  Leah [name] on 08/11/22 by a audio enabled telemedicine application and verified that I am speaking with the correct person using two identifiers.  Patient Location: Home  Provider Location: Home Office  I discussed the limitations of evaluation and management by telemedicine. The patient expressed understanding and agreed to proceed.  Patient Medicare AWV questionnaire was completed by the patient on 08/11/2022; I have confirmed that all information answered by patient is correct and no changes since this date.  Review of Systems    Vital Signs: Unable to obtain new vitals due to this being a telehealth visit.  Cardiac Risk Factors include: advanced age (>86men, >82 women);dyslipidemia;hypertension     Objective:    Today's Vitals   08/11/22 1448  Weight: 182 lb (82.6 kg)  Height: 5\' 4"  (1.626 m)   Body mass index is 31.24 kg/m.     08/11/2022    2:54 PM 05/18/2022    1:02 PM 07/27/2021    3:39 PM 07/26/2020    4:11 PM 03/26/2019    9:42 AM 08/08/2017    4:32 PM 01/17/2017   11:52 PM  Advanced Directives  Does Patient Have a Medical Advance Directive? No No No No No No   Does patient want to make changes to medical advance directive?      Yes (MAU/Ambulatory/Procedural Areas - Information given)   Would patient like information on creating a medical advance directive? Yes (MAU/Ambulatory/Procedural Areas - Information given)  No - Patient declined No - Patient declined No - Patient declined  No - Patient declined    Current Medications (verified) Outpatient Encounter Medications as of 08/11/2022  Medication Sig   albuterol (PROAIR HFA) 108 (90 Base) MCG/ACT inhaler Inhale 2 puffs into the lungs every 4 (four) hours as needed for wheezing or shortness of breath.   Albuterol Sulfate 2.5 MG/0.5ML NEBU Take 0.5  mLs (2.5 mg total) by nebulization every 6 (six) hours as needed.   aspirin 81 MG tablet Take 81 mg by mouth daily.   calcium carbonate (TUMS) 500 MG chewable tablet Chew 1 tablet (200 mg of elemental calcium total) by mouth as needed for indigestion or heartburn.   Cholecalciferol (VITAMIN D) 2000 UNITS tablet Take 2,000 Units by mouth daily.   famotidine (PEPCID) 40 MG tablet Take 1 tablet (40 mg total) by mouth daily.   fish oil-omega-3 fatty acids 1000 MG capsule Take 2 g by mouth daily.    fluticasone (FLONASE) 50 MCG/ACT nasal spray Place 2 sprays into both nostrils daily.   losartan-hydrochlorothiazide (HYZAAR) 100-12.5 MG tablet Take 1 tablet by mouth daily.   metFORMIN (GLUCOPHAGE-XR) 500 MG 24 hr tablet Take 1 tablet (500 mg total) by mouth daily with breakfast.   Multiple Vitamins-Minerals (EMERGEN-C IMMUNE PO) Take by mouth.   Polyvinyl Alcohol-Povidone (REFRESH OP) Apply to eye.   rosuvastatin (CRESTOR) 10 MG tablet Take 1 tablet (10 mg total) by mouth daily.   triamcinolone cream (KENALOG) 0.1 % Apply 1 Application topically 2 (two) times daily.   Facility-Administered Encounter Medications as of 08/11/2022  Medication   methylPREDNISolone acetate (DEPO-MEDROL) injection 40 mg    Allergies (verified) Beef allergy, Pork allergy, Beef (bovine) protein, Codeine, Levaquin [levofloxacin], Meat [alpha-gal], Penicillins, Praluent [alirocumab], Strawberry (diagnostic), and Strawberry extract   History: Past Medical History:  Diagnosis Date   Allergy  Allergy to alpha-gal    Anxiety    Arthritis    Asthma    CAD (coronary artery disease)    Colon polyp    COVID-19 virus infection 05/2020   Depression    Elevated CK    Food allergy    GERD (gastroesophageal reflux disease)    Hyperlipidemia    Hypertension    Mild tricuspid stenosis    Renal cell carcinoma (HCC)    Urticaria    Past Surgical History:  Procedure Laterality Date   ABDOMINAL HYSTERECTOMY     Carpel  Tunnel Right    CHOLECYSTECTOMY     COLONOSCOPY  02/08/2005   [id number] hemorrhoids otherwise normal rectum/ A few scattered, shallow, left-sided sigmoid diverticula/The remainder of the colonic mucosa appeared normal   COLONOSCOPY WITH ESOPHAGOGASTRODUODENOSCOPY (EGD) N/A 11/29/2012   Procedure: COLONOSCOPY WITH ESOPHAGOGASTRODUODENOSCOPY (EGD);  Surgeon: Corbin Ade, MD;  Location: [facility];  Service: Endoscopy;  Laterality: N/A;  10:45   HERNIA REPAIR     ventral X 2 with mesh, last one 2011 ([place]). complicated with 2 week hospital stay   HERNIA REPAIR     with mesh   NISSEN FUNDOPLICATION  1992   POLYPECTOMY     ROTATOR CUFF REPAIR Left 04/04/2019   TUBAL LIGATION     Family History  Problem Relation Age of Onset   Heart disease Mother    Alzheimer's disease Mother    Diabetes Mother    Other Father        deceased age 46 from some sort of GI problems but no malignancy   Diabetes Sister    Asthma Sister    Colon polyps Sister    Leukemia Brother        CLL   Diabetes Brother    Diabetes Daughter    Myasthenia gravis Daughter    Pulmonary embolism Daughter    Healthy Son    Healthy Son    Cancer Cousin        pancreatic   Cancer Cousin        pancreatic   Colon cancer Neg Hx    Stomach cancer Neg Hx    Esophageal cancer Neg Hx    Rectal cancer Neg Hx    Social History   Socioeconomic History   Marital status: Divorced    Spouse name: Not on file   Number of children: 3   Years of education: 12+   Highest education level: Some college, no degree  Occupational History   Occupation: private duty Engineer, manufacturing systems  Tobacco Use   Smoking status: Former    Current packs/day: 0.00    Average packs/day: 0.5 packs/day for 28.0 years (14.0 ttl pk-yrs)    Types: Cigarettes    Start date: 01/17/1968    Quit date: 01/17/1996    Years since quitting: 26.5   Smokeless tobacco: Never  Vaping Use   Vaping status: Never Used  Substance and Sexual Activity    Alcohol use: Yes    Alcohol/week: 2.0 standard drinks of alcohol    Types: 2 Glasses of wine per week    Comment: occasional use of wine    Drug use: No   Sexual activity: Not Currently    Partners: Male    Birth control/protection: Surgical  Other Topics Concern   Not on file  Social History Narrative   Lives alone.    Social Determinants of Health   Financial Resource Strain: Low Risk  (08/11/2022)   Overall  Financial Resource Strain (CARDIA)    Difficulty of Paying Living Expenses: Not hard at all  Food Insecurity: No Food Insecurity (08/11/2022)   Hunger Vital Sign    Worried About Running Out of Food in the Last Year: Never true    Ran Out of Food in the Last Year: Never true  Transportation Needs: No Transportation Needs (08/11/2022)   PRAPARE - Administrator, Civil Service (Medical): No    Lack of Transportation (Non-Medical): No  Physical Activity: Insufficiently Active (08/11/2022)   Exercise Vital Sign    Days of Exercise per Week: 3 days    Minutes of Exercise per Session: 30 min  Stress: No Stress Concern Present (08/11/2022)   [employer] of Occupational Health - Occupational Stress Questionnaire    Feeling of Stress : Not at all  Social Connections: Moderately Isolated (08/11/2022)   Social Connection and Isolation Panel [NHANES]    Frequency of Communication with Friends and Family: More than three times a week    Frequency of Social Gatherings with Friends and Family: More than three times a week    Attends Religious Services: More than 4 times per year    Active Member of [employer] or Organizations: No    Attends Engineer, structural: Never    Marital Status: Never married    Tobacco Counseling Counseling given: Not Answered   Clinical Intake:  Pre-visit preparation completed: Yes  Pain : No/denies pain     Nutritional Risks: None Diabetes: No  How often do you need to have someone help you when you read instructions,  pamphlets, or other written materials from your doctor or pharmacy?: 1 - Never  Interpreter Needed?: No  Information entered by :: Renie Ora, LPN   Activities of Daily Living    08/11/2022    2:54 PM  In your present state of health, do you have any difficulty performing the following activities:  Hearing? 0  Vision? 0  Difficulty concentrating or making decisions? 0  Walking or climbing stairs? 0  Dressing or bathing? 0  Doing errands, shopping? 0  Preparing Food and eating ? N  Using the Toilet? N  In the past six months, have you accidently leaked urine? N  Do you have problems with loss of bowel control? N  Managing your Medications? N  Managing your Finances? N  Housekeeping or managing your Housekeeping? N    Patient Care Team: Raliegh Ip, DO as PCP - General (Family Medicine) Kathleene Hazel, MD as PCP - Cardiology (Cardiology) Rachael Fee, MD as Attending Physician (Gastroenterology) Ronne Binning Mardene Celeste, MD as Consulting Physician (Urology) Beverely Low, MD as Consulting Physician (Orthopedic Surgery) Sinda Du, MD as Consulting Physician (Ophthalmology)  Indicate any recent Medical Services you may have received from other than [facility] providers in the past year (date may be approximate).     Assessment:   This is a routine wellness examination for Danyka.  Hearing/Vision screen Vision Screening - Comments:: Wears rx glasses - up to date with routine eye exams with  Dr.Bowman   Dietary issues and exercise activities discussed:     Goals Addressed             This Visit's Progress    DIET - INCREASE WATER INTAKE         Depression Screen    08/11/2022    2:53 PM 04/26/2022   10:34 AM 08/30/2021    2:19 PM 07/27/2021    3:35  PM 05/31/2021   11:22 AM 05/04/2021   10:12 AM 07/27/2020    4:46 PM  PHQ 2/9 Scores  PHQ - 2 Score 0 0 0 0 0 0 0  PHQ- 9 Score  0 0    0    Fall Risk    08/11/2022    2:49 PM 04/26/2022   10:32  AM 08/30/2021    2:19 PM 07/27/2021    3:32 PM 05/31/2021   11:22 AM  Fall Risk   Falls in the past year? 0 0 0 0 0  Number falls in past yr: 0 0  0   Injury with Fall? 0 0  0   Risk for fall due to : No Fall Risks No Fall Risks  No Fall Risks   Follow up Falls prevention discussed Education provided  Falls prevention discussed     MEDICARE RISK AT HOME:  Medicare Risk at Home - 08/11/22 1449     Any stairs in or around the home? Yes    If so, are there any without handrails? No    Home free of loose throw rugs in walkways, pet beds, electrical cords, etc? Yes    Adequate lighting in your home to reduce risk of falls? Yes    Life alert? No    Use of a cane, walker or w/c? No    Grab bars in the bathroom? Yes    Shower chair or bench in shower? Yes    Elevated toilet seat or a handicapped toilet? Yes             TIMED UP AND GO:  Was the test performed?  No    Cognitive Function:    08/08/2017    4:34 PM  MMSE - Mini Mental State Exam  Orientation to time 5  Orientation to Place 5  Registration 3  Attention/ Calculation 5  Recall 3  Language- name 2 objects 2  Language- repeat 1  Language- follow 3 step command 3  Language- read & follow direction 1  Write a sentence 1  Copy design 1  Total score 30        08/11/2022    2:55 PM 07/27/2021    3:42 PM 03/26/2019    9:47 AM  6CIT Screen  What Year? 0 points 0 points 0 points  What month? 0 points 0 points 0 points  What time? 0 points 0 points 0 points  Count back from 20 0 points 0 points 0 points  Months in reverse 0 points 0 points 0 points  Repeat phrase 0 points 0 points 0 points  Total Score 0 points 0 points 0 points    Immunizations Immunization History  Administered Date(s) Administered   Fluad Quad(high Dose 65+) 12/11/2018, 10/28/2019, 10/29/2020, 11/14/2021   Influenza,inj,Quad PF,6+ Mos 10/08/2012, 11/26/2013, 10/22/2014, 10/27/2015, 10/18/2016   Influenza,inj,quad, With Preservative  12/11/2018   Influenza-Unspecified 11/10/2016   Moderna Covid-19 Vaccine Bivalent Booster 88yrs & up 11/09/2020   Moderna Sars-Covid-2 Vaccination 02/21/2019, 03/22/2019, 11/25/2019   PPD Test 12/21/2020   Pneumococcal Conjugate-13 12/18/2018   Pneumococcal Polysaccharide-23 10/17/2011, 04/05/2020   Td 02/16/2016   Tdap 02/16/2016    TDAP status: Up to date  Flu Vaccine status: Up to date  Pneumococcal vaccine status: Up to date  Covid-19 vaccine status: Completed vaccines  Qualifies for Shingles Vaccine? Yes   Zostavax completed No   Shingrix Completed?: No.    Education has been provided regarding the importance of this vaccine. Patient  has been advised to call insurance company to determine out of pocket expense if they have not yet received this vaccine. Advised may also receive vaccine at local pharmacy or Health Dept. Verbalized acceptance and understanding.  Screening Tests Health Maintenance  Topic Date Due   Zoster Vaccines- Shingrix (1 of 2) Never done   COLON CANCER SCREENING ANNUAL FOBT  02/26/2019   COVID-19 Vaccine (5 - 2023-24 season) 09/16/2021   Colonoscopy  10/10/2022   INFLUENZA VACCINE  08/17/2022   MAMMOGRAM  03/09/2023   Medicare Annual Wellness (AWV)  08/11/2023   DTaP/Tdap/Td (3 - Td or Tdap) 02/15/2026   DEXA SCAN  05/10/2027   Pneumonia Vaccine 50+ Years old  Completed   Hepatitis C Screening  Completed   HPV VACCINES  Aged Out    Health Maintenance  Health Maintenance Due  Topic Date Due   Zoster Vaccines- Shingrix (1 of 2) Never done   COLON CANCER SCREENING ANNUAL FOBT  02/26/2019   COVID-19 Vaccine (5 - 2023-24 season) 09/16/2021   Colonoscopy  10/10/2022    Colorectal cancer screening: Type of screening: Colonoscopy. Completed 10/09/2017. Repeat every 5 years  Mammogram status: Completed 03/08/2022. Repeat every year  Bone Density status: Completed 05/10/2022. Results reflect: Bone density results: OSTEOPENIA. Repeat every 5  years.  Lung Cancer Screening: (Low Dose CT Chest recommended if Age 73-80 years, 20 pack-year currently smoking OR have quit w/in 15years.) does not qualify.   Lung Cancer Screening Referral: n/a  Additional Screening:  Hepatitis C Screening: does not qualify; Completed 08/22/2018  Vision Screening: Recommended annual ophthalmology exams for early detection of glaucoma and other disorders of the eye. Is the patient up to date with their annual eye exam?  Yes  Who is the provider or what is the name of the office in which the patient attends annual eye exams? Dr.Bowen   Dental Screening: Recommended annual dental exams for proper oral hygiene   Community Resource Referral / Chronic Care Management: CRR required this visit?  No   CCM required this visit?  No     Plan:     I have personally reviewed and noted the following in the patient's chart:   Medical and social history Use of alcohol, tobacco or illicit drugs  Current medications and supplements including opioid prescriptions. Patient is not currently taking opioid prescriptions. Functional ability and status Nutritional status Physical activity Advanced directives List of other physicians Hospitalizations, surgeries, and ER visits in previous 12 months Vitals Screenings to include cognitive, depression, and falls Referrals and appointments  In addition, I have reviewed and discussed with patient certain preventive protocols, quality metrics, and best practice recommendations. A written personalized care plan for preventive services as well as general preventive health recommendations were provided to patient.     Lorrene Reid, LPN   [id number]   After Visit Summary: (MyChart) Due to this being a telephonic visit, the after visit summary with patients personalized plan was offered to patient via MyChart   Nurse Notes: none

## 2022-08-11 NOTE — Patient Instructions (Signed)
Leah [name] , Thank you for taking time to come for your Medicare Wellness Visit. I appreciate your ongoing commitment to your health goals. Please review the following plan we discussed and let me know if I can assist you in the future.   Referrals/Orders/Follow-Ups/Clinician Recommendations: Aim for 30 minutes of exercise or brisk walking, 6-8 glasses of water, and 5 servings of fruits and vegetables each day.   This is a list of the screening recommended for you and due dates:  Health Maintenance  Topic Date Due   Zoster (Shingles) Vaccine (1 of 2) Never done   Stool Blood Test  02/26/2019   COVID-19 Vaccine (5 - 2023-24 season) 09/16/2021   Colon Cancer Screening  10/10/2022   Flu Shot  08/17/2022   Mammogram  03/09/2023   Medicare Annual Wellness Visit  08/11/2023   DTaP/Tdap/Td vaccine (3 - Td or Tdap) 02/15/2026   DEXA scan (bone density measurement)  05/10/2027   Pneumonia Vaccine  Completed   Hepatitis C Screening  Completed   HPV Vaccine  Aged Out    Advanced directives: (ACP Link)Information on Advanced Care Planning can be found at [facility] Advance Health Care Directives Advance Health Care Directives ([email or web address]   Next Medicare Annual Wellness Visit scheduled for next year: Yes  Preventive Care 65 Years and Older, Female Preventive care refers to lifestyle choices and visits with your health care provider that can promote health and wellness. What does preventive care include? A yearly physical exam. This is also called an annual well check. Dental exams once or twice a year. Routine eye exams. Ask your health care provider how often you should have your eyes checked. Personal lifestyle choices, including: Daily care of your teeth and gums. Regular physical activity. Eating a healthy diet. Avoiding tobacco and drug use. Limiting alcohol use. Practicing safe sex. Taking low-dose aspirin every day. Taking vitamin and mineral supplements as  recommended by your health care provider. What happens during an annual well check? The services and screenings done by your health care provider during your annual well check will depend on your age, overall health, lifestyle risk factors, and family history of disease. Counseling  Your health care provider may ask you questions about your: Alcohol use. Tobacco use. Drug use. Emotional well-being. Home and relationship well-being. Sexual activity. Eating habits. History of falls. Memory and ability to understand (cognition). Work and work Astronomer. Reproductive health. Screening  You may have the following tests or measurements: Height, weight, and BMI. Blood pressure. Lipid and cholesterol levels. These may be checked every 5 years, or more frequently if you are over 40 years old. Skin check. Lung cancer screening. You may have this screening every year starting at age 54 if you have a 30-pack-year history of smoking and currently smoke or have quit within the past 15 years. Fecal occult blood test (FOBT) of the stool. You may have this test every year starting at age 40. Flexible sigmoidoscopy or colonoscopy. You may have a sigmoidoscopy every 5 years or a colonoscopy every 10 years starting at age 55. Hepatitis C blood test. Hepatitis B blood test. Sexually transmitted disease (STD) testing. Diabetes screening. This is done by checking your blood sugar (glucose) after you have not eaten for a while (fasting). You may have this done every 1-3 years. Bone density scan. This is done to screen for osteoporosis. You may have this done starting at age 39. Mammogram. This may be done every 1-2 years. Talk to  your health care provider about how often you should have regular mammograms. Talk with your health care provider about your test results, treatment options, and if necessary, the need for more tests. Vaccines  Your health care provider may recommend certain vaccines, such  as: Influenza vaccine. This is recommended every year. Tetanus, diphtheria, and acellular pertussis (Tdap, Td) vaccine. You may need a Td booster every 10 years. Zoster vaccine. You may need this after age 90. Pneumococcal 13-valent conjugate (PCV13) vaccine. One dose is recommended after age 69. Pneumococcal polysaccharide (PPSV23) vaccine. One dose is recommended after age 39. Talk to your health care provider about which screenings and vaccines you need and how often you need them. This information is not intended to replace advice given to you by your health care provider. Make sure you discuss any questions you have with your health care provider. Document Released: 01/29/2015 Document Revised: 09/22/2015 Document Reviewed: 11/03/2014 Elsevier Interactive Patient Education  2017 [employer].  Fall Prevention in the Home Falls can cause injuries. They can happen to people of all ages. There are many things you can do to make your home safe and to help prevent falls. What can I do on the outside of my home? Regularly fix the edges of walkways and driveways and fix any cracks. Remove anything that might make you trip as you walk through a door, such as a raised step or threshold. Trim any bushes or trees on the path to your home. Use bright outdoor lighting. Clear any walking paths of anything that might make someone trip, such as rocks or tools. Regularly check to see if handrails are loose or broken. Make sure that both sides of any steps have handrails. Any raised decks and porches should have guardrails on the edges. Have any leaves, snow, or ice cleared regularly. Use sand or salt on walking paths during winter. Clean up any spills in your garage right away. This includes oil or grease spills. What can I do in the bathroom? Use night lights. Install grab bars by the toilet and in the tub and shower. Do not use towel bars as grab bars. Use non-skid mats or decals in the tub or  shower. If you need to sit down in the shower, use a plastic, non-slip stool. Keep the floor dry. Clean up any water that spills on the floor as soon as it happens. Remove soap buildup in the tub or shower regularly. Attach bath mats securely with double-sided non-slip rug tape. Do not have throw rugs and other things on the floor that can make you trip. What can I do in the bedroom? Use night lights. Make sure that you have a light by your bed that is easy to reach. Do not use any sheets or blankets that are too big for your bed. They should not hang down onto the floor. Have a firm chair that has side arms. You can use this for support while you get dressed. Do not have throw rugs and other things on the floor that can make you trip. What can I do in the kitchen? Clean up any spills right away. Avoid walking on wet floors. Keep items that you use a lot in easy-to-reach places. If you need to reach something above you, use a strong step stool that has a grab bar. Keep electrical cords out of the way. Do not use floor polish or wax that makes floors slippery. If you must use wax, use non-skid floor wax. Do not  have throw rugs and other things on the floor that can make you trip. What can I do with my stairs? Do not leave any items on the stairs. Make sure that there are handrails on both sides of the stairs and use them. Fix handrails that are broken or loose. Make sure that handrails are as long as the stairways. Check any carpeting to make sure that it is firmly attached to the stairs. Fix any carpet that is loose or worn. Avoid having throw rugs at the top or bottom of the stairs. If you do have throw rugs, attach them to the floor with carpet tape. Make sure that you have a light switch at the top of the stairs and the bottom of the stairs. If you do not have them, ask someone to add them for you. What else can I do to help prevent falls? Wear shoes that: Do not have high heels. Have  rubber bottoms. Are comfortable and fit you well. Are closed at the toe. Do not wear sandals. If you use a stepladder: Make sure that it is fully opened. Do not climb a closed stepladder. Make sure that both sides of the stepladder are locked into place. Ask someone to hold it for you, if possible. Clearly mark and make sure that you can see: Any grab bars or handrails. First and last steps. Where the edge of each step is. Use tools that help you move around (mobility aids) if they are needed. These include: Canes. Walkers. Scooters. Crutches. Turn on the lights when you go into a dark area. Replace any light bulbs as soon as they burn out. Set up your furniture so you have a clear path. Avoid moving your furniture around. If any of your floors are uneven, fix them. If there are any pets around you, be aware of where they are. Review your medicines with your doctor. Some medicines can make you feel dizzy. This can increase your chance of falling. Ask your doctor what other things that you can do to help prevent falls. This information is not intended to replace advice given to you by your health care provider. Make sure you discuss any questions you have with your health care provider. Document Released: 10/29/2008 Document Revised: 06/10/2015 Document Reviewed: 02/06/2014 Elsevier Interactive Patient Education  2017 [employer].

## 2022-10-03 ENCOUNTER — Encounter: Payer: Self-pay | Admitting: Gastroenterology

## 2022-10-23 ENCOUNTER — Other Ambulatory Visit: Payer: Self-pay | Admitting: Family Medicine

## 2022-10-23 DIAGNOSIS — E66811 Obesity, class 1: Secondary | ICD-10-CM

## 2022-10-30 ENCOUNTER — Telehealth: Payer: Self-pay | Admitting: Family Medicine

## 2022-10-30 DIAGNOSIS — Z0279 Encounter for issue of other medical certificate: Secondary | ICD-10-CM

## 2022-10-30 NOTE — Telephone Encounter (Signed)
Leah [name] dropped off Physical Form forms to be completed and signed.  Form Fee Paid? (Y/N)       y     If NO, form is placed on front office manager desk to hold until payment received. If YES, then form will be placed in the RX/HH Nurse Coordinators box for completion.  Form will not be processed until payment is received

## 2022-11-01 NOTE — Telephone Encounter (Signed)
Aware form completed and ready

## 2022-11-07 ENCOUNTER — Ambulatory Visit (INDEPENDENT_AMBULATORY_CARE_PROVIDER_SITE_OTHER): Payer: Medicare HMO | Admitting: Family Medicine

## 2022-11-07 ENCOUNTER — Encounter: Payer: Self-pay | Admitting: Family Medicine

## 2022-11-07 VITALS — BP 111/70 | HR 84 | Temp 98.8°F | Ht 64.0 in | Wt 177.0 lb

## 2022-11-07 DIAGNOSIS — M25511 Pain in right shoulder: Secondary | ICD-10-CM

## 2022-11-07 DIAGNOSIS — Z9889 Other specified postprocedural states: Secondary | ICD-10-CM

## 2022-11-07 NOTE — Progress Notes (Signed)
Subjective: CC: Right shoulder pain PCP: Raliegh Ip, DO WUJ:[id number] Leah [name] is a 72 y.o. female presenting to clinic today for:  1.  Right shoulder pain Patient reports a history of left shoulder surgery and was told at that time she would likely need right shoulder surgery at some point.  Over the last month she is really been having some increased right shoulder pain.  She points to the posterior aspect of the shoulder as the source of pain.  She is use Tylenol rapid relief with some improvement.  Does not take NSAIDs secondary to renal disease.  She notes that symptoms have been ongoing for about a month now and there was no preceding injury.  No sensory changes reported.  Unable to lift her arm up much due to pain.   ROS: Per HPI  Allergies  Allergen Reactions   Beef Allergy Anaphylaxis   Pork Allergy Anaphylaxis and Other (See Comments)   Beef (Bovine) Protein Other (See Comments)   Codeine Nausea And Vomiting   Levaquin [Levofloxacin] Other (See Comments)    Achilles tendon pain   Meat [Alpha-Gal]    Penicillins Other (See Comments)    Does not know   Praluent [Alirocumab]     Rash and myalgias   Strawberry (Diagnostic)    Strawberry Extract Other (See Comments)   Past Medical History:  Diagnosis Date   Allergy    Allergy to alpha-gal    Anxiety    Arthritis    Asthma    CAD (coronary artery disease)    Colon polyp    COVID-19 virus infection 05/2020   Depression    Elevated CK    Food allergy    GERD (gastroesophageal reflux disease)    Hyperlipidemia    Hypertension    Mild tricuspid stenosis    Renal cell carcinoma (HCC)    Urticaria     Current Outpatient Medications:    albuterol (PROAIR HFA) 108 (90 Base) MCG/ACT inhaler, Inhale 2 puffs into the lungs every 4 (four) hours as needed for wheezing or shortness of breath., Disp: 8.5 g, Rfl: 3   Albuterol Sulfate 2.5 MG/0.5ML NEBU, Take 0.5 mLs (2.5 mg total) by nebulization every 6 (six) hours  as needed., Disp: 20 mL, Rfl: 3   aspirin 81 MG tablet, Take 81 mg by mouth daily., Disp: , Rfl:    calcium carbonate (TUMS) 500 MG chewable tablet, Chew 1 tablet (200 mg of elemental calcium total) by mouth as needed for indigestion or heartburn., Disp: , Rfl:    Cholecalciferol (VITAMIN D) 2000 UNITS tablet, Take 2,000 Units by mouth daily., Disp: , Rfl:    famotidine (PEPCID) 40 MG tablet, Take 1 tablet (40 mg total) by mouth daily., Disp: 90 tablet, Rfl: 3   fish oil-omega-3 fatty acids 1000 MG capsule, Take 2 g by mouth daily. , Disp: , Rfl:    fluticasone (FLONASE) 50 MCG/ACT nasal spray, Place 2 sprays into both nostrils daily., Disp: 16 g, Rfl: 6   losartan-hydrochlorothiazide (HYZAAR) 100-12.5 MG tablet, Take 1 tablet by mouth daily., Disp: 90 tablet, Rfl: 3   metFORMIN (GLUCOPHAGE-XR) 500 MG 24 hr tablet, Take 1 tablet (500 mg total) by mouth daily with breakfast. **NEEDS TO BE SEEN BEFORE NEXT REFILL**, Disp: 30 tablet, Rfl: 0   Multiple Vitamins-Minerals (EMERGEN-C IMMUNE PO), Take by mouth., Disp: , Rfl:    Polyvinyl Alcohol-Povidone (REFRESH OP), Apply to eye., Disp: , Rfl:    rosuvastatin (CRESTOR) 10 MG tablet, Take  1 tablet (10 mg total) by mouth daily., Disp: 90 tablet, Rfl: 3   triamcinolone cream (KENALOG) 0.1 %, Apply 1 Application topically 2 (two) times daily., Disp: 30 g, Rfl: 0  Current Facility-Administered Medications:    methylPREDNISolone acetate (DEPO-MEDROL) injection 40 mg, 40 mg, Intramuscular, Once, Raliegh Ip, DO Social History   Socioeconomic History   Marital status: Divorced    Spouse name: Not on file   Number of children: 3   Years of education: 12+   Highest education level: Some college, no degree  Occupational History   Occupation: private duty Engineer, manufacturing systems  Tobacco Use   Smoking status: Former    Current packs/day: 0.00    Average packs/day: 0.5 packs/day for 28.0 years (14.0 ttl pk-yrs)    Types: Cigarettes    Start date: 01/17/1968     Quit date: 01/17/1996    Years since quitting: 26.8   Smokeless tobacco: Never  Vaping Use   Vaping status: Never Used  Substance and Sexual Activity   Alcohol use: Yes    Alcohol/week: 2.0 standard drinks of alcohol    Types: 2 Glasses of wine per week    Comment: occasional use of wine    Drug use: No   Sexual activity: Not Currently    Partners: Male    Birth control/protection: Surgical  Other Topics Concern   Not on file  Social History Narrative   Lives alone.    Social Determinants of Health   Financial Resource Strain: Low Risk  (08/11/2022)   Overall Financial Resource Strain (CARDIA)    Difficulty of Paying Living Expenses: Not hard at all  Food Insecurity: No Food Insecurity (08/11/2022)   Hunger Vital Sign    Worried About Running Out of Food in the Last Year: Never true    Ran Out of Food in the Last Year: Never true  Transportation Needs: No Transportation Needs (08/11/2022)   PRAPARE - Administrator, Civil Service (Medical): No    Lack of Transportation (Non-Medical): No  Physical Activity: Insufficiently Active (08/11/2022)   Exercise Vital Sign    Days of Exercise per Week: 3 days    Minutes of Exercise per Session: 30 min  Stress: No Stress Concern Present (08/11/2022)   [employer] of Occupational Health - Occupational Stress Questionnaire    Feeling of Stress : Not at all  Social Connections: Moderately Isolated (08/11/2022)   Social Connection and Isolation Panel [NHANES]    Frequency of Communication with Friends and Family: More than three times a week    Frequency of Social Gatherings with Friends and Family: More than three times a week    Attends Religious Services: More than 4 times per year    Active Member of [employer] or Organizations: No    Attends Banker Meetings: Never    Marital Status: Never married  Intimate Partner Violence: Not At Risk (08/11/2022)   Humiliation, Afraid, Rape, and Kick questionnaire    Fear  of Current or Ex-Partner: No    Emotionally Abused: No    Physically Abused: No    Sexually Abused: No   Family History  Problem Relation Age of Onset   Heart disease Mother    Alzheimer's disease Mother    Diabetes Mother    Other Father        deceased age 64 from some sort of GI problems but no malignancy   Diabetes Sister    Asthma Sister  Colon polyps Sister    Leukemia Brother        CLL   Diabetes Brother    Diabetes Daughter    Myasthenia gravis Daughter    Pulmonary embolism Daughter    Healthy Son    Healthy Son    Cancer Cousin        pancreatic   Cancer Cousin        pancreatic   Colon cancer Neg Hx    Stomach cancer Neg Hx    Esophageal cancer Neg Hx    Rectal cancer Neg Hx     Objective: Office vital signs reviewed. BP 111/70   Pulse 84   Temp 98.8 F (37.1 C)   Ht 5\' 4"  (1.626 m)   Wt 177 lb (80.3 kg)   SpO2 99%   BMI 30.38 kg/m   Physical Examination:  General: Awake, alert, well nourished, No acute distress HEENT: Sclera white.  Moist mucous membranes Cardio: regular rate and rhythm, S1S2 heard, no murmurs appreciated Pulm: clear to auscultation bilaterally, no wheezes, rhonchi or rales; normal work of breathing on room air MSK: Marked reduction in range of motion of the right shoulder.  Cannot AB duct or flex past about 90 degrees without pain.  She has painful arc sign present.  Tenderness palpation along the posterior lateral aspect of the shoulder but no palpable or visible deformity.  Positive empty can test  Assessment/ Plan: 72 y.o. female   Acute pain of right shoulder - Plan: Ambulatory referral to Physical Therapy, Ambulatory referral to Orthopedic Surgery  History of shoulder surgery - Plan: Ambulatory referral to Physical Therapy, Ambulatory referral to Orthopedic Surgery  Referral to physical therapy and orthopedic surgery placed.  Based on her exam I am suspicious for possible rotator cuff tendinopathy that is compounded by  known degenerative changes in that right shoulder.  I have given her corticosteroid injection but discussed that repeated use of steroids are not ideal for her and they could cause slowing of healing and degradation of bone over time.  For now I recommend that she continue supportive care and hopefully physical therapy will aid in improvement of her symptoms and range of motion.   Raliegh Ip, DO [facility] [phone]

## 2022-11-09 ENCOUNTER — Ambulatory Visit: Payer: Medicare HMO | Attending: Family Medicine | Admitting: Physical Therapy

## 2022-11-09 ENCOUNTER — Other Ambulatory Visit: Payer: Self-pay

## 2022-11-09 ENCOUNTER — Encounter: Payer: Self-pay | Admitting: Physical Therapy

## 2022-11-09 DIAGNOSIS — Z9889 Other specified postprocedural states: Secondary | ICD-10-CM | POA: Diagnosis not present

## 2022-11-09 DIAGNOSIS — M25611 Stiffness of right shoulder, not elsewhere classified: Secondary | ICD-10-CM | POA: Insufficient documentation

## 2022-11-09 DIAGNOSIS — M25511 Pain in right shoulder: Secondary | ICD-10-CM | POA: Diagnosis present

## 2022-11-09 NOTE — Therapy (Signed)
OUTPATIENT PHYSICAL THERAPY SHOULDER EVALUATION   Patient Name: Leah [name] MRN: [id number] DOB:Apr 29, 1950, 72 y.o., female Today's Date: 11/09/2022  END OF SESSION:  PT End of Session - 11/09/22 1231     Visit Number 1    Number of Visits 12    Date for PT Re-Evaluation 02/07/23    Authorization Type FOTO.    PT Start Time 1017    PT Stop Time 1103    PT Time Calculation (min) 46 min    Activity Tolerance Patient tolerated treatment well    Behavior During Therapy WFL for tasks assessed/performed             Past Medical History:  Diagnosis Date   Allergy    Allergy to alpha-gal    Anxiety    Arthritis    Asthma    CAD (coronary artery disease)    Colon polyp    COVID-19 virus infection 05/2020   Depression    Elevated CK    Food allergy    GERD (gastroesophageal reflux disease)    Hyperlipidemia    Hypertension    Mild tricuspid stenosis    Renal cell carcinoma (HCC)    Urticaria    Past Surgical History:  Procedure Laterality Date   ABDOMINAL HYSTERECTOMY     Carpel Tunnel Right    CHOLECYSTECTOMY     COLONOSCOPY  02/08/2005   WUJ:[id number] hemorrhoids otherwise normal rectum/ A few scattered, shallow, left-sided sigmoid diverticula/The remainder of the colonic mucosa appeared normal   COLONOSCOPY WITH ESOPHAGOGASTRODUODENOSCOPY (EGD) N/A 11/29/2012   Procedure: COLONOSCOPY WITH ESOPHAGOGASTRODUODENOSCOPY (EGD);  Surgeon: Corbin Ade, MD;  Location: [facility];  Service: Endoscopy;  Laterality: N/A;  10:45   HERNIA REPAIR     ventral X 2 with mesh, last one 2011 ([place]). complicated with 2 week hospital stay   HERNIA REPAIR     with mesh   NISSEN FUNDOPLICATION  1992   POLYPECTOMY     ROTATOR CUFF REPAIR Left 04/04/2019   TUBAL LIGATION     Patient Active Problem List   Diagnosis Date Noted   Elevated CK 11/18/2020   Pain in right shoulder 11/18/2020   Muscle cramps 11/18/2020   Myalgia 11/18/2020   Renal mass 08/17/2020    Bloating 07/02/2020   Flatulence 07/02/2020   Generalized abdominal pain 07/02/2020   Pelvic pain 06/15/2020   Cough 06/07/2020   CAD (coronary artery disease)    Hypertension    Tear of medial meniscus of knee 05/20/2020   COVID-19 05/2020   Hip pain 10/28/2019   Acute pain of right knee 10/28/2019   Right lower quadrant abdominal pain 10/28/2019   Internal hemorrhoids 04/11/2019   Constipation 04/11/2019   Rectal bleeding 07/13/2017   Arthropathy of left shoulder 07/13/2017   Hypokalemia 02/20/2017   Chest pain 01/17/2017   Adjustment disorder with mixed anxiety and depressed mood 03/25/2013   Epigastric pain 11/12/2012   Nausea alone 11/12/2012   History of colonic polyps 11/12/2012   Hypertension, benign essential, goal below 140/90 10/30/2012   Hyperlipidemia 10/22/2012   GERD (gastroesophageal reflux disease) 10/22/2012   Asthma 10/22/2012   REFERRING PROVIDER: Delynn Flavin DO  REFERRING DIAG: Acute right shoulder pain.  THERAPY DIAG:  Acute pain of right shoulder  Stiffness of right shoulder, not elsewhere classified  Rationale for Evaluation and Treatment: Rehabilitation  ONSET DATE: ~2 months.  SUBJECTIVE:  SUBJECTIVE STATEMENT: The patient presents to the clinic with c/o right shoulder pain that has been ongoing for approximately 2 months.  She feels this may be related to reaching backward as she tends to keep her purse in the backseat.  She is no longer do this.  Her pain is rated at an 8/10 today.  Moving her right shoulder in any direction increases her pain.  She takes Tylenol Rapid Release which decreases her pain some.    PERTINENT HISTORY: Bee sting and latex allergy, RCR of left shoulder.  PAIN:  Are you having pain? Yes: NPRS scale: 8/10 Pain location: Right  shoulder. Pain description: Ache, sharp. Aggravating factors: As above. Relieving factors: as above.  PRECAUTIONS: None   FALLS:  Has patient fallen in last 6 months? No  LIVING ENVIRONMENT: Lives with: lives with their family Lives in: House/apartment Has following equipment at home:  None.  OCCUPATION: Retired.  PLOF: Independent  PATIENT GOALS:Use right shoulder without pain.  NEXT MD VISIT:   OBJECTIVE:  Note: Objective measures were completed at Evaluation unless otherwise noted.  PATIENT SURVEYS:  FOTO 51.73.   POSTURE: Rounded shoulders.  UPPER EXTREMITY ROM:   Active right shoulder flexion to 130 degrees activeand 150 degrees passive, active ER performed slowly through a full range of motion, behind back to left upper gluteal region.  UPPER EXTREMITY MMT:  IR/ER tested with elbow by side graded grossly at 4- to 4/5. Right shoulder deltoid strength~ 4-/5.  SHOULDER SPECIAL TESTS: Mild pain reproduction with right shoulder impingment testing. (-) Drop Arm test.  PALPATION:  Tender to palpation over right posterior cuff musculature and bicipital groove.   TODAY'S TREATMENT:                                                                                                                                         DATE: HMP and IFC at 80-150 Hz on 40% scan x 20 minutes to patient's right shoulder.  Normal modality response following removal of modality.   PATIENT EDUCATION:   HOME EXERCISE PROGRAM: ASSESSMENT:  CLINICAL IMPRESSION: The patient presents to [facility] with c/o right  shoulder pain over the last two months.  She thinks it could be related to repetitively moving her purse to/from the backseat of her vehicle.  She has a lot of pain with active movement of her right shoulder.  She has limited range of motion and a FOTO limitation score 51.73.  She is tender to palpation over her posterior cuff musculature and bicipital groove.  She demonstrated a (-)  Drop Arm test and had mild pain reproduction with Impingement testing.    Patient will benefit from skilled physical therapy intervention to address pain and deficits.  OBJECTIVE IMPAIRMENTS: decreased activity tolerance, decreased ROM, decreased strength, increased muscle spasms, and pain.   ACTIVITY LIMITATIONS: carrying and reach over head  PARTICIPATION LIMITATIONS: meal prep, cleaning, and  laundry  REHAB POTENTIAL: Good  CLINICAL DECISION MAKING: Stable/uncomplicated  EVALUATION COMPLEXITY: Low   GOALS: LONG TERM GOALS: Target date: 02/07/23.  Ind with a HEP.  Goal status: INITIAL  2.  Active shoulder flexion to 155 degrees so the patient can easily reach overhead.  Goal status: INITIAL  3.   Increase ROM so patient is able to reach behind back to L3.  Goal status: INITIAL  4. Increase right shoulder strength to a solid 4+/5 to increase stability for performance of functional activities.  Goal status: INITIAL  5.   Perform ADL's with pain not > 3/10.  Goal status: INITIAL   PLAN:  PT FREQUENCY: 2x/week  PT DURATION: 6 weeks  PLANNED INTERVENTIONS: 97110-Therapeutic exercises, 97530- Therapeutic activity, 97112- Neuromuscular re-education, 97535- Self Care, 81191- Manual therapy, 97014- Electrical stimulation (unattended), 97016- Vasopneumatic device, 97035- Ultrasound, Dry Needling, Cryotherapy, and Moist heat  PLAN FOR NEXT SESSION: Combo e'stim/US, STW/M, pulleys, wall ladder, supine cane exercises.       Syona Wroblewski, [place], PT 11/09/2022, 12:56 PM

## 2022-11-10 ENCOUNTER — Encounter: Payer: Self-pay | Admitting: Gastroenterology

## 2022-11-14 ENCOUNTER — Ambulatory Visit: Payer: Medicare HMO | Admitting: *Deleted

## 2022-11-14 ENCOUNTER — Encounter: Payer: Self-pay | Admitting: *Deleted

## 2022-11-14 DIAGNOSIS — M25511 Pain in right shoulder: Secondary | ICD-10-CM | POA: Diagnosis not present

## 2022-11-14 DIAGNOSIS — M25611 Stiffness of right shoulder, not elsewhere classified: Secondary | ICD-10-CM

## 2022-11-14 NOTE — Therapy (Signed)
OUTPATIENT PHYSICAL THERAPY SHOULDER TREATMENT   Patient Name: Leah [name] MRN: [id number] DOB:1950-09-02, 72 y.o., female Today's Date: 11/14/2022  END OF SESSION:  PT End of Session - 11/14/22 0933     Visit Number 2    Number of Visits 12    Date for PT Re-Evaluation 02/07/23    Authorization Type FOTO.    PT Start Time 0930    PT Stop Time 1020    PT Time Calculation (min) 50 min             Past Medical History:  Diagnosis Date   Allergy    Allergy to alpha-gal    Anxiety    Arthritis    Asthma    CAD (coronary artery disease)    Colon polyp    COVID-19 virus infection 05/2020   Depression    Elevated CK    Food allergy    GERD (gastroesophageal reflux disease)    Hyperlipidemia    Hypertension    Mild tricuspid stenosis    Renal cell carcinoma (HCC)    Urticaria    Past Surgical History:  Procedure Laterality Date   ABDOMINAL HYSTERECTOMY     Carpel Tunnel Right    CHOLECYSTECTOMY     COLONOSCOPY  02/08/2005   [id number] hemorrhoids otherwise normal rectum/ A few scattered, shallow, left-sided sigmoid diverticula/The remainder of the colonic mucosa appeared normal   COLONOSCOPY WITH ESOPHAGOGASTRODUODENOSCOPY (EGD) N/A 11/29/2012   Procedure: COLONOSCOPY WITH ESOPHAGOGASTRODUODENOSCOPY (EGD);  Surgeon: Corbin Ade, MD;  Location: [facility];  Service: Endoscopy;  Laterality: N/A;  10:45   HERNIA REPAIR     ventral X 2 with mesh, last one 2011 ([place]). complicated with 2 week hospital stay   HERNIA REPAIR     with mesh   NISSEN FUNDOPLICATION  1992   POLYPECTOMY     ROTATOR CUFF REPAIR Left 04/04/2019   TUBAL LIGATION     Patient Active Problem List   Diagnosis Date Noted   Elevated CK 11/18/2020   Pain in right shoulder 11/18/2020   Muscle cramps 11/18/2020   Myalgia 11/18/2020   Renal mass 08/17/2020   Bloating 07/02/2020   Flatulence 07/02/2020   Generalized abdominal pain 07/02/2020   Pelvic pain 06/15/2020   Cough  06/07/2020   CAD (coronary artery disease)    Hypertension    Tear of medial meniscus of knee 05/20/2020   COVID-19 05/2020   Hip pain 10/28/2019   Acute pain of right knee 10/28/2019   Right lower quadrant abdominal pain 10/28/2019   Internal hemorrhoids 04/11/2019   Constipation 04/11/2019   Rectal bleeding 07/13/2017   Arthropathy of left shoulder 07/13/2017   Hypokalemia 02/20/2017   Chest pain 01/17/2017   Adjustment disorder with mixed anxiety and depressed mood 03/25/2013   Epigastric pain 11/12/2012   Nausea alone 11/12/2012   History of colonic polyps 11/12/2012   Hypertension, benign essential, goal below 140/90 10/30/2012   Hyperlipidemia 10/22/2012   GERD (gastroesophageal reflux disease) 10/22/2012   Asthma 10/22/2012   REFERRING PROVIDER: Delynn Flavin DO  REFERRING DIAG: Acute right shoulder pain.  THERAPY DIAG:  Acute pain of right shoulder  Stiffness of right shoulder, not elsewhere classified  Rationale for Evaluation and Treatment: Rehabilitation  ONSET DATE: ~2 months.  SUBJECTIVE:  SUBJECTIVE STATEMENT: Doing better with some decreased pain today  RT shldr  PERTINENT HISTORY: Bee sting and latex allergy, RCR of left shoulder.  PAIN:  Are you having pain? Yes: NPRS scale: 7/10 Pain location: Right shoulder. Pain description: Ache, sharp. Aggravating factors: As above. Relieving factors: as above.  PRECAUTIONS: None   FALLS:  Has patient fallen in last 6 months? No  LIVING ENVIRONMENT: Lives with: lives with their family Lives in: House/apartment Has following equipment at home:  None.  OCCUPATION: Retired.  PLOF: Independent  PATIENT GOALS:Use right shoulder without pain.  NEXT MD VISIT:   OBJECTIVE:  Note: Objective measures were completed at  Evaluation unless otherwise noted.  PATIENT SURVEYS:  FOTO 51.73.   POSTURE: Rounded shoulders.  UPPER EXTREMITY ROM:   Active right shoulder flexion to 130 degrees activeand 150 degrees passive, active ER performed slowly through a full range of motion, behind back to left upper gluteal region.  UPPER EXTREMITY MMT:  IR/ER tested with elbow by side graded grossly at 4- to 4/5. Right shoulder deltoid strength~ 4-/5.  SHOULDER SPECIAL TESTS: Mild pain reproduction with right shoulder impingment testing. (-) Drop Arm test.  PALPATION:  Tender to palpation over right posterior cuff musculature and bicipital groove.   TODAY'S TREATMENT:     RT shldr  UBE x 5 mins  at 120 RPMs Pulleys x 3 mins [place] combo x 10 mins 1.5 w/cm2  to RT shldr posterior cuff Manual STW to RT shldr posterior cuff                                                                                                                                        DATE: HMP and IFC at 80-150 Hz on 40% scan x 15 minutes to patient's right shoulder.  Normal modality response following removal of modality.   PATIENT EDUCATION:   HOME EXERCISE PROGRAM: ASSESSMENT:  CLINICAL IMPRESSION: The patient presents to [facility] with c/o right  shoulder pain over the last two months.  Rx focused on Therex , [place] combo, as well as STW to RT shldr. Pt did fairly well with therex with only soreness noted. She had notable soreness posterior cuff, but with decreased pain end of session.    OBJECTIVE IMPAIRMENTS: decreased activity tolerance, decreased ROM, decreased strength, increased muscle spasms, and pain.   ACTIVITY LIMITATIONS: carrying and reach over head  PARTICIPATION LIMITATIONS: meal prep, cleaning, and laundry  REHAB POTENTIAL: Good  CLINICAL DECISION MAKING: Stable/uncomplicated  EVALUATION COMPLEXITY: Low   GOALS: LONG TERM GOALS: Target date: 02/07/23.  Ind with a HEP.  Goal status: INITIAL  2.  Active shoulder  flexion to 155 degrees so the patient can easily reach overhead.  Goal status: INITIAL  3.   Increase ROM so patient is able to reach behind back to L3.  Goal status: INITIAL  4. Increase right shoulder strength to a solid 4+/5  to increase stability for performance of functional activities.  Goal status: INITIAL  5.   Perform ADL's with pain not > 3/10.  Goal status: INITIAL   PLAN:  PT FREQUENCY: 2x/week  PT DURATION: 6 weeks  PLANNED INTERVENTIONS: 97110-Therapeutic exercises, 97530- Therapeutic activity, 97112- Neuromuscular re-education, 97535- Self Care, 69629- Manual therapy, 97014- Electrical stimulation (unattended), 97016- Vasopneumatic device, 97035- Ultrasound, Dry Needling, Cryotherapy, and Moist heat  PLAN FOR NEXT SESSION: Combo e'stim/US, STW/M, pulleys, wall ladder, supine cane exercises.       Sharmayne Jablon,CHRIS, PTA 11/14/2022, 1:21 PM

## 2022-11-16 ENCOUNTER — Ambulatory Visit: Payer: Medicare HMO

## 2022-11-21 ENCOUNTER — Ambulatory Visit: Payer: Medicare HMO | Attending: Family Medicine

## 2022-11-21 DIAGNOSIS — M25611 Stiffness of right shoulder, not elsewhere classified: Secondary | ICD-10-CM | POA: Insufficient documentation

## 2022-11-21 DIAGNOSIS — M25511 Pain in right shoulder: Secondary | ICD-10-CM | POA: Insufficient documentation

## 2022-11-21 NOTE — Therapy (Signed)
OUTPATIENT PHYSICAL THERAPY SHOULDER TREATMENT   Patient Name: Leah [name] MRN: [id number] DOB:[birth year], 72 y.o., female Today's Date: 11/21/2022  END OF SESSION:  PT End of Session - 11/21/22 0936     Visit Number 3    Number of Visits 12    Date for PT Re-Evaluation 02/07/23    Authorization Type FOTO.    PT Start Time 0930    PT Stop Time 1029    PT Time Calculation (min) 59 min             Past Medical History:  Diagnosis Date   Allergy    Allergy to alpha-gal    Anxiety    Arthritis    Asthma    CAD (coronary artery disease)    Colon polyp    COVID-19 virus infection 05/2020   Depression    Elevated CK    Food allergy    GERD (gastroesophageal reflux disease)    Hyperlipidemia    Hypertension    Mild tricuspid stenosis    Renal cell carcinoma (HCC)    Urticaria    Past Surgical History:  Procedure Laterality Date   ABDOMINAL HYSTERECTOMY     Carpel Tunnel Right    CHOLECYSTECTOMY     COLONOSCOPY  02/08/2005   [id number]:[id number] hemorrhoids otherwise normal rectum/ A few scattered, shallow, left-sided sigmoid diverticula/The remainder of the colonic mucosa appeared normal   COLONOSCOPY WITH ESOPHAGOGASTRODUODENOSCOPY (EGD) N/A 11/29/2012   Procedure: COLONOSCOPY WITH ESOPHAGOGASTRODUODENOSCOPY (EGD);  Surgeon: Corbin Ade, MD;  [facility]: [facility];  Service: Endoscopy;  Laterality: N/A;  10:45   HERNIA REPAIR     ventral X 2 with mesh, last one 2011 ([place]). complicated with 2 week hospital stay   HERNIA REPAIR     with mesh   NISSEN FUNDOPLICATION  1992   POLYPECTOMY     ROTATOR CUFF REPAIR Left 04/04/2019   TUBAL LIGATION     Patient Active Problem List   Diagnosis Date Noted   Elevated CK 11/18/2020   Pain in right shoulder 11/18/2020   Muscle cramps 11/18/2020   Myalgia 11/18/2020   Renal mass 08/17/2020   Bloating 07/02/2020   Flatulence 07/02/2020   Generalized abdominal pain 07/02/2020   Pelvic pain 06/15/2020   Cough  06/07/2020   CAD (coronary artery disease)    Hypertension    Tear of medial meniscus of knee 05/20/2020   COVID-19 05/2020   Hip pain 10/28/2019   Acute pain of right knee 10/28/2019   Right lower quadrant abdominal pain 10/28/2019   Internal hemorrhoids 04/11/2019   Constipation 04/11/2019   Rectal bleeding 07/13/2017   Arthropathy of left shoulder 07/13/2017   Hypokalemia 02/20/2017   Chest pain 01/17/2017   Adjustment disorder with mixed anxiety and depressed mood 03/25/2013   Epigastric pain 11/12/2012   Nausea alone 11/12/2012   History of colonic polyps 11/12/2012   Hypertension, benign essential, goal below 140/90 10/30/2012   Hyperlipidemia 10/22/2012   GERD (gastroesophageal reflux disease) 10/22/2012   Asthma 10/22/2012   REFERRING PROVIDER: Delynn Flavin DO  REFERRING DIAG: Acute right shoulder pain.  THERAPY DIAG:  Acute pain of right shoulder  Stiffness of right shoulder, not elsewhere classified  Rationale for Evaluation and Treatment: Rehabilitation  ONSET DATE: ~2 months.  SUBJECTIVE:  SUBJECTIVE STATEMENT:  Pt reports 5-6/10 right shoulder pain today.   PERTINENT HISTORY: Bee sting and latex allergy, RCR of left shoulder.  PAIN:  Are you having pain? Yes: NPRS scale: 5-6/10 Pain location: Right shoulder. Pain description: Ache, sharp. Aggravating factors: As above. Relieving factors: as above.  PRECAUTIONS: None   FALLS:  Has patient fallen in last 6 months? No  LIVING ENVIRONMENT: Lives with: lives with their family Lives in: House/apartment Has following equipment at home:  None.  OCCUPATION: Retired.  PLOF: Independent  PATIENT GOALS:Use right shoulder without pain.  NEXT MD VISIT:   OBJECTIVE:  Note: Objective measures were completed at  Evaluation unless otherwise noted.  PATIENT SURVEYS:  FOTO 51.73.   POSTURE: Rounded shoulders.  UPPER EXTREMITY ROM:   Active right shoulder flexion to 130 degrees activeand 150 degrees passive, active ER performed slowly through a full range of motion, behind back to left upper gluteal region.  UPPER EXTREMITY MMT:  IR/ER tested with elbow by side graded grossly at 4- to 4/5. Right shoulder deltoid strength~ 4-/5.  SHOULDER SPECIAL TESTS: Mild pain reproduction with right shoulder impingment testing. (-) Drop Arm test.  PALPATION:  Tender to palpation over right posterior cuff musculature and bicipital groove.   TODAY'S TREATMENT:     RT shldr                   11/21/22                 EXERCISE LOG  Exercise Repetitions and Resistance Comments  UBE 6 mins   Pulleys 4 mins   Ranger Flex/ext; CW and CCW circles x2 mines each            Blank cell = exercise not performed today   Manual Therapy Soft Tissue Mobilization: Right shoulder, STW/M to right deltoid and bicep to decrease pain and tone    Modalities  Date:  Unattended Estim: Shoulder, IFC 80-150 Hz, 15 mins, Pain Hot Pack: Shoulder, 15 mins, Pain and Tone    UBE x 5 mins  at 120 RPMs Pulleys x 3 mins [place] combo x 10 mins 1.5 w/cm2  to RT shldr posterior cuff Manual STW to RT shldr posterior cuff                                                                                                                                        DATE: HMP and IFC at 80-150 Hz on 40% scan x 15 minutes to patient's right shoulder.  Normal modality response following removal of modality.   PATIENT EDUCATION:   HOME EXERCISE PROGRAM: ASSESSMENT:  CLINICAL IMPRESSION: Pt arrives for today's treatment session reporting 5-6/10 right shoulder pain.  Pt able to tolerate increased time with UBE today reporting relief afterward.  Pt reporting increased discomfort and tone after performing seated Ranger today.  STW/M performed to  right deltoid and  bicep to decrease pain and tone.  Normal responses to estim and MH noted upon removal.  Pt reported decreased pain at completion of today's treatment session.   OBJECTIVE IMPAIRMENTS: decreased activity tolerance, decreased ROM, decreased strength, increased muscle spasms, and pain.   ACTIVITY LIMITATIONS: carrying and reach over head  PARTICIPATION LIMITATIONS: meal prep, cleaning, and laundry  REHAB POTENTIAL: Good  CLINICAL DECISION MAKING: Stable/uncomplicated  EVALUATION COMPLEXITY: Low   GOALS: LONG TERM GOALS: Target date: 02/07/23.  Ind with a HEP.  Goal status: INITIAL  2.  Active shoulder flexion to 155 degrees so the patient can easily reach overhead.  Goal status: INITIAL  3.   Increase ROM so patient is able to reach behind back to L3.  Goal status: INITIAL  4. Increase right shoulder strength to a solid 4+/5 to increase stability for performance of functional activities.  Goal status: INITIAL  5.   Perform ADL's with pain not > 3/10.  Goal status: INITIAL   PLAN:  PT FREQUENCY: 2x/week  PT DURATION: 6 weeks  PLANNED INTERVENTIONS: 97110-Therapeutic exercises, 97530- Therapeutic activity, 97112- Neuromuscular re-education, 97535- Self Care, 95284- Manual therapy, 97014- Electrical stimulation (unattended), 97016- Vasopneumatic device, 97035- Ultrasound, Dry Needling, Cryotherapy, and Moist heat  PLAN FOR NEXT SESSION: Combo e'stim/US, STW/M, pulleys, wall ladder, supine cane exercises.       Newman Pies, PTA 11/21/2022, 10:40 AM

## 2022-11-21 NOTE — Progress Notes (Unsigned)
No chief complaint on file.  History of Present Illness: 72 yo female with history of HTN, HLD and mild CAD who is here today for cardiac follow up. She was admitted to [facility] January 2019 with chest pain.and had a coronary CTA which showed mild non-obstructive CAD. She had not tolerated statins or Zetia in the past. She was seen in the lipid clinic in 2019 and was started on Praluent but she did not tolerate it. She has tolerated Crestor. Normal ABI September 2022. Echo September 2022 with LVEF=55-60%. No valve disease. Coronary CTA August 2022 with mild CAD. *** What was the renal mass (She has been found to have a renal mass which is suspected to be cancer. She is having this followed at [facility] (Dr. Daphine Deutscher). ))   She is here today for follow up. The patient denies any chest pain, dyspnea, palpitations, lower extremity edema, orthopnea, PND, dizziness, near syncope or syncope.   Primary Care Physician: Raliegh Ip, DO  Past Medical History:  Diagnosis Date   Allergy    Allergy to alpha-gal    Anxiety    Arthritis    Asthma    CAD (coronary artery disease)    Colon polyp    COVID-19 virus infection 05/2020   Depression    Elevated CK    Food allergy    GERD (gastroesophageal reflux disease)    Hyperlipidemia    Hypertension    Mild tricuspid stenosis    Renal cell carcinoma (HCC)    Urticaria     Past Surgical History:  Procedure Laterality Date   ABDOMINAL HYSTERECTOMY     Carpel Tunnel Right    CHOLECYSTECTOMY     COLONOSCOPY  02/08/2005   [id number] hemorrhoids otherwise normal rectum/ A few scattered, shallow, left-sided sigmoid diverticula/The remainder of the colonic mucosa appeared normal   COLONOSCOPY WITH ESOPHAGOGASTRODUODENOSCOPY (EGD) N/A 11/29/2012   Procedure: COLONOSCOPY WITH ESOPHAGOGASTRODUODENOSCOPY (EGD);  Surgeon: Corbin Ade, MD;  Location: [facility];  Service: Endoscopy;  Laterality: N/A;  10:45   HERNIA REPAIR     ventral X 2 with mesh,  last one 2011 ([place]). complicated with 2 week hospital stay   HERNIA REPAIR     with mesh   NISSEN FUNDOPLICATION  1992   POLYPECTOMY     ROTATOR CUFF REPAIR Left 04/04/2019   TUBAL LIGATION      Current Outpatient Medications  Medication Sig Dispense Refill   albuterol (PROAIR HFA) 108 (90 Base) MCG/ACT inhaler Inhale 2 puffs into the lungs every 4 (four) hours as needed for wheezing or shortness of breath. 8.5 g 3   Albuterol Sulfate 2.5 MG/0.5ML NEBU Take 0.5 mLs (2.5 mg total) by nebulization every 6 (six) hours as needed. 20 mL 3   aspirin 81 MG tablet Take 81 mg by mouth daily.     calcium carbonate (TUMS) 500 MG chewable tablet Chew 1 tablet (200 mg of elemental calcium total) by mouth as needed for indigestion or heartburn.     Cholecalciferol (VITAMIN D) 2000 UNITS tablet Take 2,000 Units by mouth daily.     famotidine (PEPCID) 40 MG tablet Take 1 tablet (40 mg total) by mouth daily. 90 tablet 3   fish oil-omega-3 fatty acids 1000 MG capsule Take 2 g by mouth daily.      fluticasone (FLONASE) 50 MCG/ACT nasal spray Place 2 sprays into both nostrils daily. 16 g 6   losartan-hydrochlorothiazide (HYZAAR) 100-12.5 MG tablet Take 1 tablet by mouth daily. 90  tablet 3   metFORMIN (GLUCOPHAGE-XR) 500 MG 24 hr tablet Take 1 tablet (500 mg total) by mouth daily with breakfast. **NEEDS TO BE SEEN BEFORE NEXT REFILL** 30 tablet 0   Multiple Vitamins-Minerals (EMERGEN-C IMMUNE PO) Take by mouth.     Polyvinyl Alcohol-Povidone (REFRESH OP) Apply to eye.     rosuvastatin (CRESTOR) 10 MG tablet Take 1 tablet (10 mg total) by mouth daily. 90 tablet 3   triamcinolone cream (KENALOG) 0.1 % Apply 1 Application topically 2 (two) times daily. 30 g 0   Current Facility-Administered Medications  Medication Dose Route Frequency Provider Last Rate Last Admin   methylPREDNISolone acetate (DEPO-MEDROL) injection 40 mg  40 mg Intramuscular Once Gottschalk, Ashly M, DO        Allergies  Allergen  Reactions   Beef Allergy Anaphylaxis   Pork Allergy Anaphylaxis and Other (See Comments)   Beef (Bovine) Protein Other (See Comments)   Codeine Nausea And Vomiting   Levaquin [Levofloxacin] Other (See Comments)    Achilles tendon pain   Meat [Alpha-Gal]    Penicillins Other (See Comments)    Does not know   Praluent [Alirocumab]     Rash and myalgias   Strawberry (Diagnostic)    Strawberry Extract Other (See Comments)    Social History   Socioeconomic History   Marital status: Divorced    Spouse name: Not on file   Number of children: 3   Years of education: 12+   Highest education level: Some college, no degree  Occupational History   Occupation: private duty Engineer, manufacturing systems  Tobacco Use   Smoking status: Former    Current packs/day: 0.00    Average packs/day: 0.5 packs/day for 28.0 years (14.0 ttl pk-yrs)    Types: Cigarettes    Start date: 01/17/1968    Quit date: 01/17/1996    Years since quitting: 26.8   Smokeless tobacco: Never  Vaping Use   Vaping status: Never Used  Substance and Sexual Activity   Alcohol use: Yes    Alcohol/week: 2.0 standard drinks of alcohol    Types: 2 Glasses of wine per week    Comment: occasional use of wine    Drug use: No   Sexual activity: Not Currently    Partners: Male    Birth control/protection: Surgical  Other Topics Concern   Not on file  Social History Narrative   Lives alone.    Social Determinants of Health   Financial Resource Strain: Low Risk  (08/11/2022)   Overall Financial Resource Strain (CARDIA)    Difficulty of Paying Living Expenses: Not hard at all  Food Insecurity: No Food Insecurity (08/11/2022)   Hunger Vital Sign    Worried About Running Out of Food in the Last Year: Never true    Ran Out of Food in the Last Year: Never true  Transportation Needs: No Transportation Needs (08/11/2022)   PRAPARE - Administrator, Civil Service (Medical): No    Lack of Transportation (Non-Medical): No  Physical  Activity: Insufficiently Active (08/11/2022)   Exercise Vital Sign    Days of Exercise per Week: 3 days    Minutes of Exercise per Session: 30 min  Stress: No Stress Concern Present (08/11/2022)   [employer] of Occupational Health - Occupational Stress Questionnaire    Feeling of Stress : Not at all  Social Connections: Moderately Isolated (08/11/2022)   Social Connection and Isolation Panel [NHANES]    Frequency of Communication with Friends and Family: More  than three times a week    Frequency of Social Gatherings with Friends and Family: More than three times a week    Attends Religious Services: More than 4 times per year    Active Member of [employer] or Organizations: No    Attends Banker Meetings: Never    Marital Status: Never married  Intimate Partner Violence: Not At Risk (08/11/2022)   Humiliation, Afraid, Rape, and Kick questionnaire    Fear of Current or Ex-Partner: No    Emotionally Abused: No    Physically Abused: No    Sexually Abused: No    Family History  Problem Relation Age of Onset   Heart disease Mother    Alzheimer's disease Mother    Diabetes Mother    Other Father        deceased age 72 from some sort of GI problems but no malignancy   Diabetes Sister    Asthma Sister    Colon polyps Sister    Leukemia Brother        CLL   Diabetes Brother    Diabetes Daughter    Myasthenia gravis Daughter    Pulmonary embolism Daughter    Healthy Son    Healthy Son    Cancer Cousin        pancreatic   Cancer Cousin        pancreatic   Colon cancer Neg Hx    Stomach cancer Neg Hx    Esophageal cancer Neg Hx    Rectal cancer Neg Hx     Review of Systems:  As stated in the HPI and otherwise negative.   There were no vitals taken for this visit.  Physical Examination: General: Well developed, well nourished, NAD  HEENT: OP clear, mucus membranes moist  SKIN: warm, dry. No rashes. Neuro: No focal deficits  Musculoskeletal: Muscle strength  5/5 all ext  Psychiatric: Mood and affect normal  Neck: No JVD, no carotid bruits, no thyromegaly, no lymphadenopathy.  Lungs:Clear bilaterally, no wheezes, rhonci, crackles Cardiovascular: Regular rate and rhythm. No murmurs, gallops or rubs. Abdomen:Soft. Bowel sounds present. Non-tender.  Extremities: No lower extremity edema. Pulses are 2 + in the bilateral DP/PT.  EKG:  EKG is *** ordered today. The ekg ordered today demonstrates   Echo September 2022:  1. Left ventricular ejection fraction, by estimation, is 55 to 60%. Left  ventricular ejection fraction by 3D volume is 58 %. The left ventricle has  normal function. There is mild concentric left ventricular hypertrophy.  Left ventricular diastolic  parameters are consistent with Grade I diastolic dysfunction (impaired  relaxation). The average left ventricular global longitudinal strain is  -21.6 %. The global longitudinal strain is normal.   2. Right ventricular systolic function is normal. The right ventricular  size is normal. Tricuspid regurgitation signal is inadequate for assessing  PA pressure.   3. The mitral valve is normal in structure. Trivial mitral valve  regurgitation. No evidence of mitral stenosis.   4. Suggest limited study with definity contrast to better   5. Mild tricuspid stenosis.   6. The aortic valve is normal in structure. Aortic valve regurgitation is  not visualized. No aortic stenosis is present.   7. The inferior vena cava is normal in size with greater than 50%  respiratory variability, suggesting right atrial pressure of 3 mmHg.   Recent Labs: 03/15/2022: ALT 20 04/26/2022: BUN 19; Creatinine, Ser 0.78; Hemoglobin 12.7; Platelets 262; Potassium 4.1; Sodium 142; TSH 1.190  Lipid Panel    Component Value Date/Time   CHOL 166 03/15/2022 0840   TRIG 76 03/15/2022 0840   TRIG 79 02/17/2016 0817   HDL 57 03/15/2022 0840   HDL 53 02/17/2016 0817   CHOLHDL 2.9 03/15/2022 0840   CHOLHDL 3.5  06/11/2007 0450   VLDL 20 06/11/2007 0450   LDLCALC 95 03/15/2022 0840   LDLCALC 171 (H) 08/25/2013 0810     Wt Readings from Last 3 Encounters:  11/07/22 80.3 kg  08/11/22 82.6 kg  04/26/22 83.3 kg    Assessment and Plan:   1. CAD without angina: Mild CAD noted on coronary CTA in 2022. No chest pain suggestive of angina. Continue ASA and Crestor.   2. HTN: BP is well controlled. No changes today  3. HLD: LDL not at goal in February 2024. She did not tolerate Praluent, Zetia or any high dose statins. She is tolerating Crestor 10 mg daily. Will continue Crestor.   Labs/ tests ordered today include:  No orders of the defined types were placed in this encounter.  Disposition:   F/U with me in 12 months  Signed, Verne Carrow, MD 11/21/2022 1:32 PM    [facility] [address] [place], [place], [place]  [address] Phone: [phone]; Fax: [phone]

## 2022-11-22 ENCOUNTER — Other Ambulatory Visit: Payer: Self-pay | Admitting: Family Medicine

## 2022-11-22 ENCOUNTER — Encounter: Payer: Self-pay | Admitting: Cardiovascular Disease

## 2022-11-22 ENCOUNTER — Ambulatory Visit: Payer: Medicare HMO | Attending: Cardiovascular Disease | Admitting: Cardiovascular Disease

## 2022-11-22 VITALS — BP 140/86 | HR 73 | Ht 64.0 in | Wt 175.6 lb

## 2022-11-22 DIAGNOSIS — E782 Mixed hyperlipidemia: Secondary | ICD-10-CM | POA: Diagnosis not present

## 2022-11-22 DIAGNOSIS — I2510 Atherosclerotic heart disease of native coronary artery without angina pectoris: Secondary | ICD-10-CM | POA: Diagnosis not present

## 2022-11-22 DIAGNOSIS — E66811 Obesity, class 1: Secondary | ICD-10-CM

## 2022-11-22 DIAGNOSIS — I10 Essential (primary) hypertension: Secondary | ICD-10-CM | POA: Diagnosis not present

## 2022-11-22 NOTE — Patient Instructions (Signed)

## 2022-11-30 ENCOUNTER — Ambulatory Visit: Payer: Medicare HMO

## 2022-11-30 DIAGNOSIS — M25511 Pain in right shoulder: Secondary | ICD-10-CM

## 2022-11-30 DIAGNOSIS — M25611 Stiffness of right shoulder, not elsewhere classified: Secondary | ICD-10-CM

## 2022-11-30 NOTE — Therapy (Signed)
OUTPATIENT PHYSICAL THERAPY SHOULDER TREATMENT   Patient Name: Leah [name] MRN: [id number] DOB:Dec 05, 1950, 72 y.o., female Today's Date: 11/30/2022  END OF SESSION:  PT End of Session - 11/30/22 0934     Visit Number 4    Number of Visits 12    Date for PT Re-Evaluation 02/07/23    Authorization Type FOTO.    PT Start Time 0930    PT Stop Time 1030    PT Time Calculation (min) 60 min             Past Medical History:  Diagnosis Date   Allergy    Allergy to alpha-gal    Anxiety    Arthritis    Asthma    CAD (coronary artery disease)    Colon polyp    COVID-19 virus infection 05/2020   Depression    Elevated CK    Food allergy    GERD (gastroesophageal reflux disease)    Hyperlipidemia    Hypertension    Mild tricuspid stenosis    Renal cell carcinoma (HCC)    Urticaria    Past Surgical History:  Procedure Laterality Date   ABDOMINAL HYSTERECTOMY     Carpel Tunnel Right    CHOLECYSTECTOMY     COLONOSCOPY  02/08/2005   [id number] hemorrhoids otherwise normal rectum/ A few scattered, shallow, left-sided sigmoid diverticula/The remainder of the colonic mucosa appeared normal   COLONOSCOPY WITH ESOPHAGOGASTRODUODENOSCOPY (EGD) N/A 11/29/2012   Procedure: COLONOSCOPY WITH ESOPHAGOGASTRODUODENOSCOPY (EGD);  Surgeon: Corbin Ade, MD;  Location: [facility];  Service: Endoscopy;  Laterality: N/A;  10:45   HERNIA REPAIR     ventral X 2 with mesh, last one 2011 ([place]). complicated with 2 week hospital stay   HERNIA REPAIR     with mesh   NISSEN FUNDOPLICATION  1992   POLYPECTOMY     ROTATOR CUFF REPAIR Left 04/04/2019   TUBAL LIGATION     Patient Active Problem List   Diagnosis Date Noted   Elevated CK 11/18/2020   Pain in right shoulder 11/18/2020   Muscle cramps 11/18/2020   Myalgia 11/18/2020   Renal mass 08/17/2020   Bloating 07/02/2020   Flatulence 07/02/2020   Generalized abdominal pain 07/02/2020   Pelvic pain 06/15/2020   Cough  06/07/2020   CAD (coronary artery disease)    Hypertension    Tear of medial meniscus of knee 05/20/2020   COVID-19 05/2020   Hip pain 10/28/2019   Acute pain of right knee 10/28/2019   Right lower quadrant abdominal pain 10/28/2019   Internal hemorrhoids 04/11/2019   Constipation 04/11/2019   Rectal bleeding 07/13/2017   Arthropathy of left shoulder 07/13/2017   Hypokalemia 02/20/2017   Chest pain 01/17/2017   Adjustment disorder with mixed anxiety and depressed mood 03/25/2013   Epigastric pain 11/12/2012   Nausea alone 11/12/2012   History of colonic polyps 11/12/2012   Hypertension, benign essential, goal below 140/90 10/30/2012   Hyperlipidemia 10/22/2012   GERD (gastroesophageal reflux disease) 10/22/2012   Asthma 10/22/2012   REFERRING PROVIDER: Delynn Flavin DO  REFERRING DIAG: Acute right shoulder pain.  THERAPY DIAG:  Acute pain of right shoulder  Stiffness of right shoulder, not elsewhere classified  Rationale for Evaluation and Treatment: Rehabilitation  ONSET DATE: ~2 months.  SUBJECTIVE:  SUBJECTIVE STATEMENT:  Pt reports 5/10 right shoulder pain today.   PERTINENT HISTORY: Bee sting and latex allergy, RCR of left shoulder.  PAIN:  Are you having pain? Yes: NPRS scale: 5/10 Pain location: Right shoulder. Pain description: Ache, sharp. Aggravating factors: As above. Relieving factors: as above.  PRECAUTIONS: None   FALLS:  Has patient fallen in last 6 months? No  LIVING ENVIRONMENT: Lives with: lives with their family Lives in: House/apartment Has following equipment at home:  None.  OCCUPATION: Retired.  PLOF: Independent  PATIENT GOALS:Use right shoulder without pain.  NEXT MD VISIT:   OBJECTIVE:  Note: Objective measures were completed at Evaluation  unless otherwise noted.  PATIENT SURVEYS:  FOTO 51.73.   POSTURE: Rounded shoulders.  UPPER EXTREMITY ROM:   Active right shoulder flexion to 130 degrees activeand 150 degrees passive, active ER performed slowly through a full range of motion, behind back to left upper gluteal region.  UPPER EXTREMITY MMT:  IR/ER tested with elbow by side graded grossly at 4- to 4/5. Right shoulder deltoid strength~ 4-/5.  SHOULDER SPECIAL TESTS: Mild pain reproduction with right shoulder impingment testing. (-) Drop Arm test.  PALPATION:  Tender to palpation over right posterior cuff musculature and bicipital groove.   TODAY'S TREATMENT:     RT shldr                   11/30/22                 EXERCISE LOG  Exercise Repetitions and Resistance Comments  UBE 10 mins (5 mins forward/backward)   Pulleys 2 mins   Ranger Flex/ext; CW and CCW circles x2 mines each   Cones to Shelf 5 cones x 2 reps each   Ball on Wall 2 mins    Blank cell = exercise not performed today   Manual Therapy Soft Tissue Mobilization: Right shoulder, STW/M to right deltoid and bicep to decrease pain and tone    Modalities  Date:  Unattended Estim: Shoulder, IFC 80-150 Hz, 15 mins, Pain Hot Pack: Shoulder, 15 mins, Pain and Tone    UBE x 5 mins  at 120 RPMs Pulleys x 3 mins [place] combo x 10 mins 1.5 w/cm2  to RT shldr posterior cuff Manual STW to RT shldr posterior cuff                                                                                                                                        DATE: HMP and IFC at 80-150 Hz on 40% scan x 15 minutes to patient's right shoulder.  Normal modality response following removal of modality.   PATIENT EDUCATION:   HOME EXERCISE PROGRAM: ASSESSMENT:  CLINICAL IMPRESSION: Pt arrives for today's treatment session reporting 5/10 right shoulder pain. Pt's FOTO score remained at 52 today.  Pt introduced to ball on the wall today with minimal increase in  pain at the  top of each rep.  Pt challenged by placing cones on the shelf, but able to perform all reps asked of her.  STW/M performed to right deltoid and posterior rotator cuff to decrease pain and tone.  Normal responses to estim and MH noted upon removal.  Pt reported 3/10 right shoulder pain at completion of today's treatment session.   OBJECTIVE IMPAIRMENTS: decreased activity tolerance, decreased ROM, decreased strength, increased muscle spasms, and pain.   ACTIVITY LIMITATIONS: carrying and reach over head  PARTICIPATION LIMITATIONS: meal prep, cleaning, and laundry  REHAB POTENTIAL: Good  CLINICAL DECISION MAKING: Stable/uncomplicated  EVALUATION COMPLEXITY: Low   GOALS: LONG TERM GOALS: Target date: 02/07/23.  Ind with a HEP.  Goal status: INITIAL  2.  Active shoulder flexion to 155 degrees so the patient can easily reach overhead.  Goal status: INITIAL  3.   Increase ROM so patient is able to reach behind back to L3.  Goal status: INITIAL  4. Increase right shoulder strength to a solid 4+/5 to increase stability for performance of functional activities.  Goal status: INITIAL  5.   Perform ADL's with pain not > 3/10.  Goal status: INITIAL   PLAN:  PT FREQUENCY: 2x/week  PT DURATION: 6 weeks  PLANNED INTERVENTIONS: 97110-Therapeutic exercises, 97530- Therapeutic activity, 97112- Neuromuscular re-education, 97535- Self Care, [address]- Manual therapy, 97014- Electrical stimulation (unattended), 97016- Vasopneumatic device, 97035- Ultrasound, Dry Needling, Cryotherapy, and Moist heat  PLAN FOR NEXT SESSION: Combo e'stim/US, STW/M, pulleys, wall ladder, supine cane exercises.       Newman Pies, PTA 11/30/2022, 10:30 AM

## 2022-12-04 ENCOUNTER — Ambulatory Visit: Payer: Medicare HMO

## 2022-12-07 ENCOUNTER — Ambulatory Visit: Payer: Medicare HMO

## 2022-12-07 DIAGNOSIS — M25611 Stiffness of right shoulder, not elsewhere classified: Secondary | ICD-10-CM | POA: Diagnosis not present

## 2022-12-07 DIAGNOSIS — M25511 Pain in right shoulder: Secondary | ICD-10-CM | POA: Diagnosis not present

## 2022-12-07 IMAGING — DX DG ABDOMEN 2V
3 series · 3 of 3 positions shown · non-contrast
Comparison: Abdominal x-ray dated October 28, 2019.

CLINICAL DATA: Abdominal pain.

EXAM:
ABDOMEN - 2 VIEW

[abdomen erect]
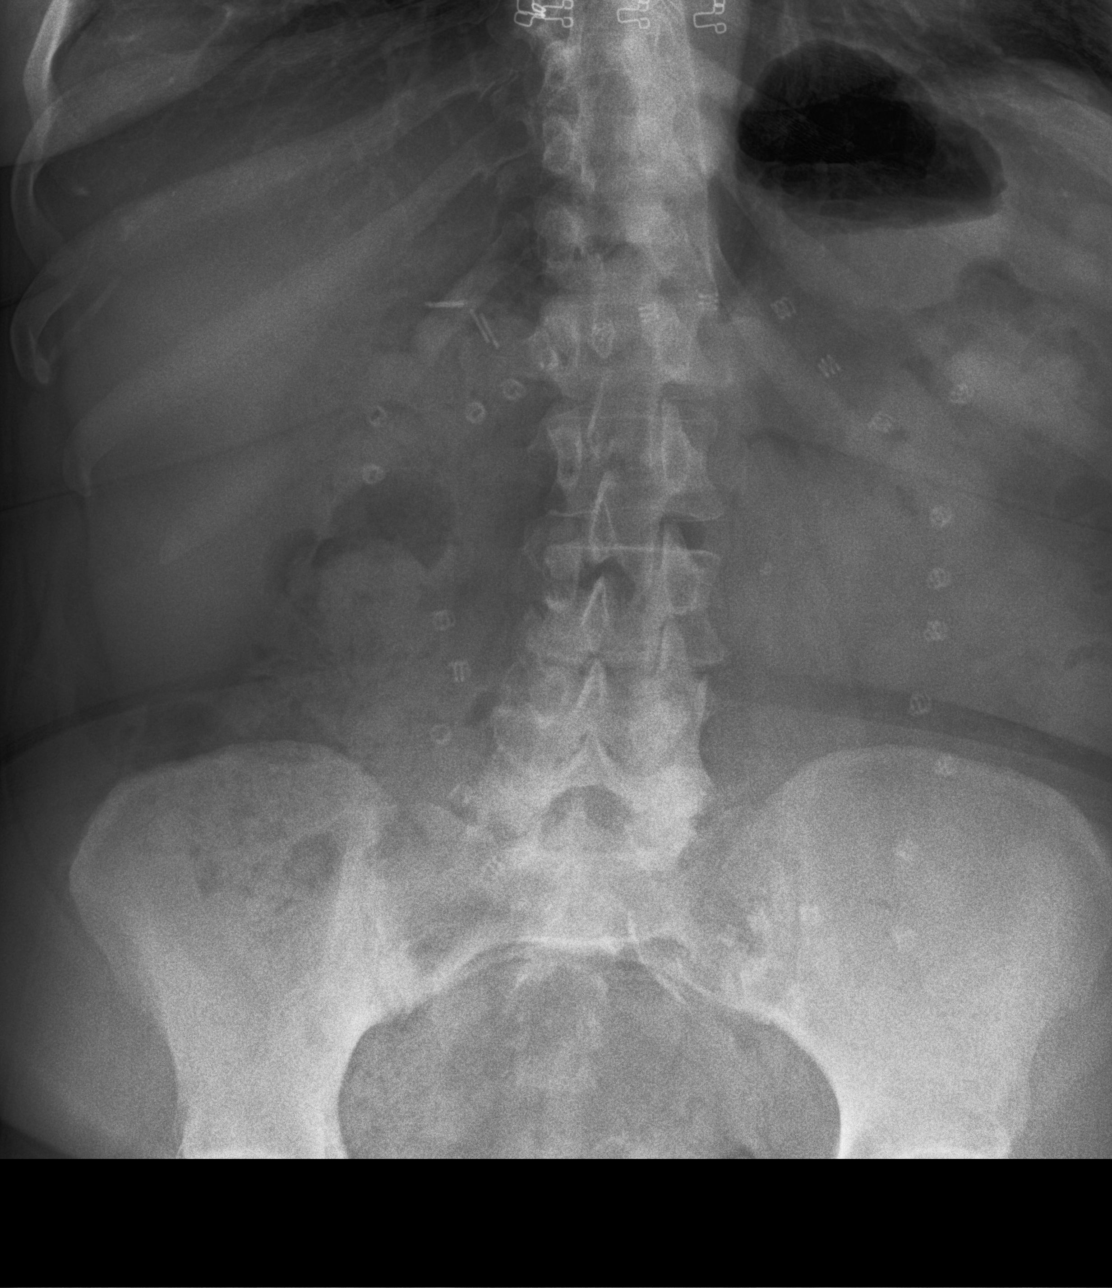

[abdomen supine (1 of 2)]
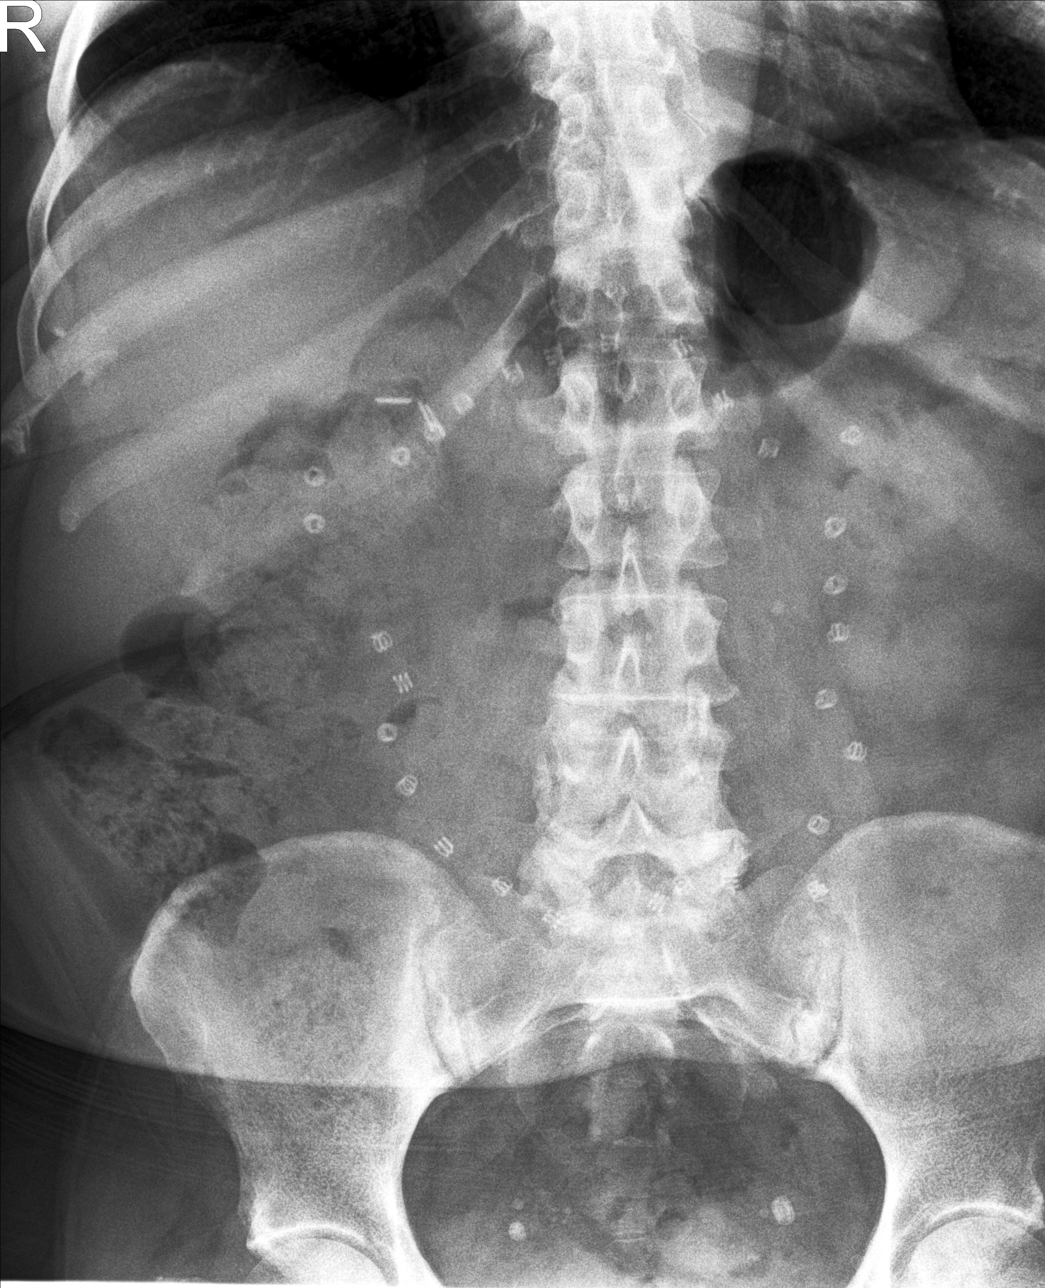

[abdomen supine (2 of 2)]
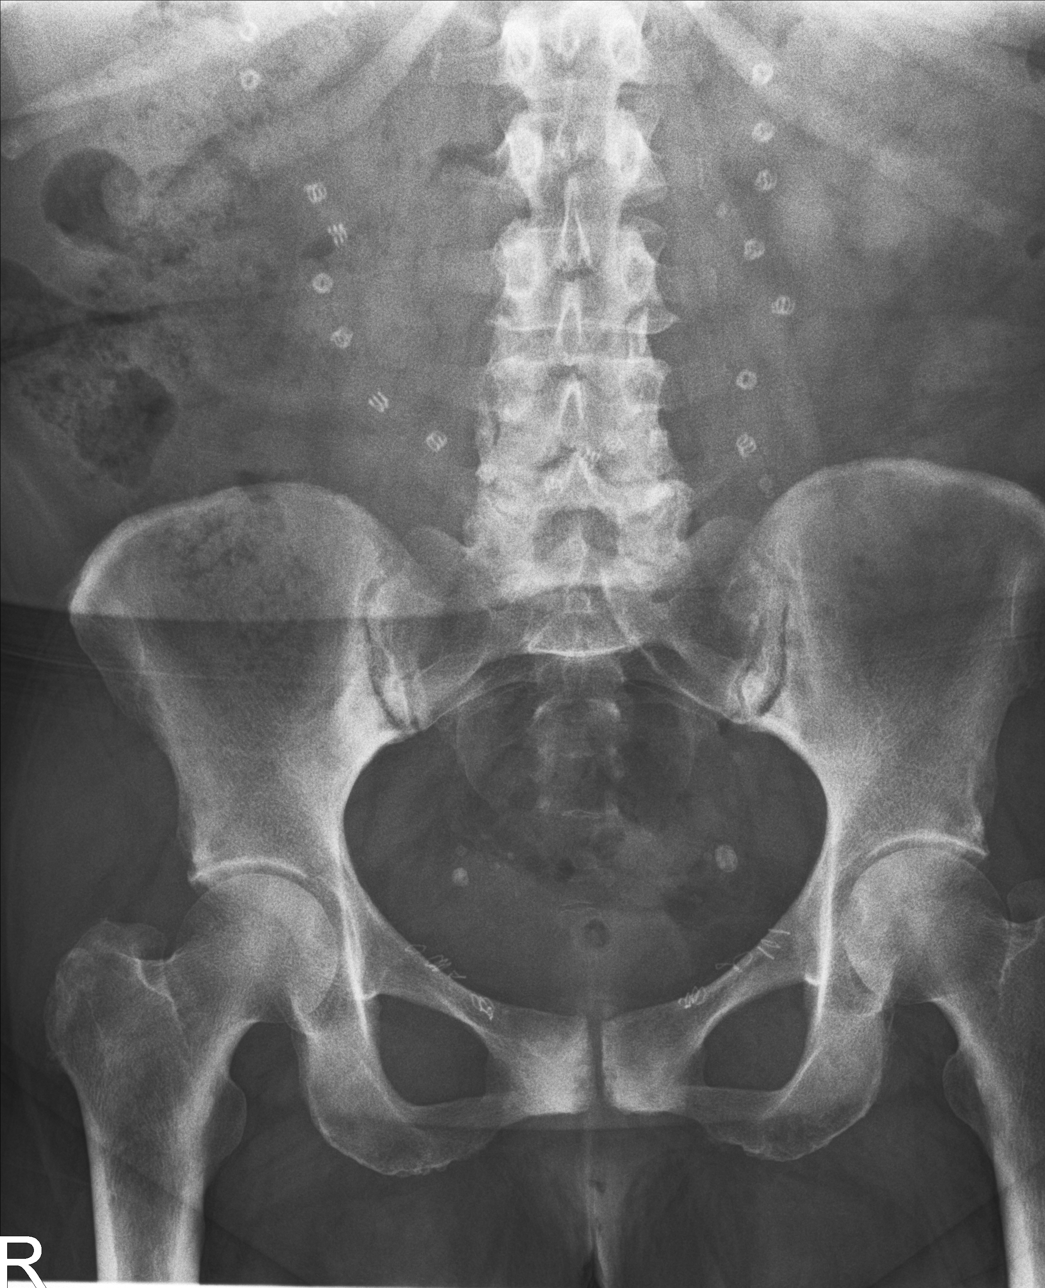

[3 of 3 positions shown; findings below may reference images not displayed]

FINDINGS: Normal bowel gas pattern. Unchanged moderate colonic stool burden.
No definite renal calculi. Unchanged 4 mm calcification between the
left L2 and L3 transverse processes, consistent with a left gonadal
vein phlebolith when compared to prior CT from 4855. Similar
phleboliths in the pelvis. No acute osseous abnormality. Chronic
bilateral sacroiliitis with periarticular sclerosis, greater on the
right. Prior hernia repair and cholecystectomy.
IMPRESSION: 1. No acute findings.

## 2022-12-07 NOTE — Therapy (Signed)
OUTPATIENT PHYSICAL THERAPY SHOULDER TREATMENT   Patient Name: Leah [name] MRN: [id number] DOB:01/16/1951, 72 y.o., female Today's Date: 12/07/2022  END OF SESSION:  PT End of Session - 12/07/22 0932     Visit Number 5    Number of Visits 12    Date for PT Re-Evaluation 02/07/23    Authorization Type FOTO.    PT Start Time 0930    PT Stop Time 1029    PT Time Calculation (min) 59 min             Past Medical History:  Diagnosis Date   Allergy    Allergy to alpha-gal    Anxiety    Arthritis    Asthma    CAD (coronary artery disease)    Colon polyp    COVID-19 virus infection 05/2020   Depression    Elevated CK    Food allergy    GERD (gastroesophageal reflux disease)    Hyperlipidemia    Hypertension    Mild tricuspid stenosis    Renal cell carcinoma (HCC)    Urticaria    Past Surgical History:  Procedure Laterality Date   ABDOMINAL HYSTERECTOMY     Carpel Tunnel Right    CHOLECYSTECTOMY     COLONOSCOPY  02/08/2005   [id number]:[id number] hemorrhoids otherwise normal rectum/ A few scattered, shallow, left-sided sigmoid diverticula/The remainder of the colonic mucosa appeared normal   COLONOSCOPY WITH ESOPHAGOGASTRODUODENOSCOPY (EGD) N/A 11/29/2012   Procedure: COLONOSCOPY WITH ESOPHAGOGASTRODUODENOSCOPY (EGD);  Surgeon: Corbin Ade, MD;  Location: [facility];  Service: Endoscopy;  Laterality: N/A;  10:45   HERNIA REPAIR     ventral X 2 with mesh, last one 2011 ([place]). complicated with 2 week hospital stay   HERNIA REPAIR     with mesh   NISSEN FUNDOPLICATION  1992   POLYPECTOMY     ROTATOR CUFF REPAIR Left 04/04/2019   TUBAL LIGATION     Patient Active Problem List   Diagnosis Date Noted   Elevated CK 11/18/2020   Pain in right shoulder 11/18/2020   Muscle cramps 11/18/2020   Myalgia 11/18/2020   Renal mass 08/17/2020   Bloating 07/02/2020   Flatulence 07/02/2020   Generalized abdominal pain 07/02/2020   Pelvic pain 06/15/2020   Cough  06/07/2020   CAD (coronary artery disease)    Hypertension    Tear of medial meniscus of knee 05/20/2020   COVID-19 05/2020   Hip pain 10/28/2019   Acute pain of right knee 10/28/2019   Right lower quadrant abdominal pain 10/28/2019   Internal hemorrhoids 04/11/2019   Constipation 04/11/2019   Rectal bleeding 07/13/2017   Arthropathy of left shoulder 07/13/2017   Hypokalemia 02/20/2017   Chest pain 01/17/2017   Adjustment disorder with mixed anxiety and depressed mood 03/25/2013   Epigastric pain 11/12/2012   Nausea alone 11/12/2012   History of colonic polyps 11/12/2012   Hypertension, benign essential, goal below 140/90 10/30/2012   Hyperlipidemia 10/22/2012   GERD (gastroesophageal reflux disease) 10/22/2012   Asthma 10/22/2012   REFERRING PROVIDER: Delynn Flavin DO  REFERRING DIAG: Acute right shoulder pain.  THERAPY DIAG:  Acute pain of right shoulder  Stiffness of right shoulder, not elsewhere classified  Rationale for Evaluation and Treatment: Rehabilitation  ONSET DATE: ~2 months.  SUBJECTIVE:  SUBJECTIVE STATEMENT:  Pt reports 5/10 right shoulder pain today.   PERTINENT HISTORY: Bee sting and latex allergy, RCR of left shoulder.  PAIN:  Are you having pain? Yes: NPRS scale: 5/10 Pain location: Right shoulder. Pain description: Ache, sharp. Aggravating factors: As above. Relieving factors: as above.  PRECAUTIONS: None   FALLS:  Has patient fallen in last 6 months? No  LIVING ENVIRONMENT: Lives with: lives with their family Lives in: House/apartment Has following equipment at home:  None.  OCCUPATION: Retired.  PLOF: Independent  PATIENT GOALS:Use right shoulder without pain.  NEXT MD VISIT:   OBJECTIVE:  Note: Objective measures were completed at Evaluation  unless otherwise noted.  PATIENT SURVEYS:  FOTO 51.73.   POSTURE: Rounded shoulders.  UPPER EXTREMITY ROM:   Active right shoulder flexion to 130 degrees activeand 150 degrees passive, active ER performed slowly through a full range of motion, behind back to left upper gluteal region.  UPPER EXTREMITY MMT:  IR/ER tested with elbow by side graded grossly at 4- to 4/5. Right shoulder deltoid strength~ 4-/5.  SHOULDER SPECIAL TESTS: Mild pain reproduction with right shoulder impingment testing. (-) Drop Arm test.  PALPATION:  Tender to palpation over right posterior cuff musculature and bicipital groove.   TODAY'S TREATMENT:     RT shldr                   12/07/22                 EXERCISE LOG  Exercise Repetitions and Resistance Comments  UBE 10 mins (5 mins forward/backward)   Pulleys 3 mins   Ranger Flex/ext; CW and CCW circles x2 mines each   Cones to Shelf 5 cones x 2 reps each   Ball on Wall 2 mins    Blank cell = exercise not performed today   Manual Therapy Soft Tissue Mobilization: Right shoulder, STW/M to right deltoid and bicep to decrease pain and tone    Modalities  Date:  Unattended Estim: Shoulder, IFC 80-150 Hz, 15 mins, Pain Hot Pack: Shoulder, 15 mins, Pain and Tone    UBE x 5 mins  at 120 RPMs Pulleys x 3 mins [place] combo x 10 mins 1.5 w/cm2  to RT shldr posterior cuff Manual STW to RT shldr posterior cuff                                                                                                                                        DATE: HMP and IFC at 80-150 Hz on 40% scan x 15 minutes to patient's right shoulder.  Normal modality response following removal of modality.   PATIENT EDUCATION:   HOME EXERCISE PROGRAM: ASSESSMENT:  CLINICAL IMPRESSION: Pt arrives for today's treatment session reporting 5/10 right shoulder pain.  Pt continues to report increased discomfort and fatigue with pulleys and UE ranger.  STW/M performed to right  posterior and anterior shoulder musculature as well as bicep and deltoid to decrease pain and tone with good results.  Normal responses to estim and MH noted upon removal.  Pt reported decreased pain at completion of today's treatment session.  OBJECTIVE IMPAIRMENTS: decreased activity tolerance, decreased ROM, decreased strength, increased muscle spasms, and pain.   ACTIVITY LIMITATIONS: carrying and reach over head  PARTICIPATION LIMITATIONS: meal prep, cleaning, and laundry  REHAB POTENTIAL: Good  CLINICAL DECISION MAKING: Stable/uncomplicated  EVALUATION COMPLEXITY: Low   GOALS: LONG TERM GOALS: Target date: 02/07/23.  Ind with a HEP.  Goal status: INITIAL  2.  Active shoulder flexion to 155 degrees so the patient can easily reach overhead.  Goal status: INITIAL  3.   Increase ROM so patient is able to reach behind back to L3.  Goal status: INITIAL  4. Increase right shoulder strength to a solid 4+/5 to increase stability for performance of functional activities.  Goal status: INITIAL  5.   Perform ADL's with pain not > 3/10.  Goal status: INITIAL   PLAN:  PT FREQUENCY: 2x/week  PT DURATION: 6 weeks  PLANNED INTERVENTIONS: 97110-Therapeutic exercises, 97530- Therapeutic activity, 97112- Neuromuscular re-education, 97535- Self Care, 13244- Manual therapy, 97014- Electrical stimulation (unattended), 97016- Vasopneumatic device, 97035- Ultrasound, Dry Needling, Cryotherapy, and Moist heat  PLAN FOR NEXT SESSION: Combo e'stim/US, STW/M, pulleys, wall ladder, supine cane exercises.       Newman Pies, PTA 12/07/2022, 11:18 AM

## 2022-12-11 ENCOUNTER — Ambulatory Visit (INDEPENDENT_AMBULATORY_CARE_PROVIDER_SITE_OTHER): Payer: Medicare HMO

## 2022-12-11 DIAGNOSIS — Z23 Encounter for immunization: Secondary | ICD-10-CM

## 2022-12-12 ENCOUNTER — Encounter: Payer: Medicare HMO | Admitting: Physical Therapy

## 2022-12-21 ENCOUNTER — Ambulatory Visit: Payer: Medicare HMO | Attending: Family Medicine

## 2022-12-21 DIAGNOSIS — M25511 Pain in right shoulder: Secondary | ICD-10-CM

## 2022-12-21 DIAGNOSIS — M25611 Stiffness of right shoulder, not elsewhere classified: Secondary | ICD-10-CM | POA: Diagnosis not present

## 2022-12-21 NOTE — Therapy (Signed)
OUTPATIENT PHYSICAL THERAPY SHOULDER TREATMENT   Patient Name: Leah [name] MRN: [id number] DOB:03-30-1950, 72 y.o., female Today's Date: 12/21/2022  END OF SESSION:  PT End of Session - 12/21/22 0941     Visit Number 6    Number of Visits 12    Date for PT Re-Evaluation 02/07/23    Authorization Type FOTO.    PT Start Time 0930    PT Stop Time 1029    PT Time Calculation (min) 59 min             Past Medical History:  Diagnosis Date   Allergy    Allergy to alpha-gal    Anxiety    Arthritis    Asthma    CAD (coronary artery disease)    Colon polyp    COVID-19 virus infection 05/2020   Depression    Elevated CK    Food allergy    GERD (gastroesophageal reflux disease)    Hyperlipidemia    Hypertension    Mild tricuspid stenosis    Renal cell carcinoma (HCC)    Urticaria    Past Surgical History:  Procedure Laterality Date   ABDOMINAL HYSTERECTOMY     Carpel Tunnel Right    CHOLECYSTECTOMY     COLONOSCOPY  02/08/2005   [id number] hemorrhoids otherwise normal rectum/ A few scattered, shallow, left-sided sigmoid diverticula/The remainder of the colonic mucosa appeared normal   COLONOSCOPY WITH ESOPHAGOGASTRODUODENOSCOPY (EGD) N/A 11/29/2012   Procedure: COLONOSCOPY WITH ESOPHAGOGASTRODUODENOSCOPY (EGD);  Surgeon: Corbin Ade, MD;  Location: [facility];  Service: Endoscopy;  Laterality: N/A;  10:45   HERNIA REPAIR     ventral X 2 with mesh, last one 2011 ([place]). complicated with 2 week hospital stay   HERNIA REPAIR     with mesh   NISSEN FUNDOPLICATION  1992   POLYPECTOMY     ROTATOR CUFF REPAIR Left 04/04/2019   TUBAL LIGATION     Patient Active Problem List   Diagnosis Date Noted   Elevated CK 11/18/2020   Pain in right shoulder 11/18/2020   Muscle cramps 11/18/2020   Myalgia 11/18/2020   Renal mass 08/17/2020   Bloating 07/02/2020   Flatulence 07/02/2020   Generalized abdominal pain 07/02/2020   Pelvic pain 06/15/2020   Cough  06/07/2020   CAD (coronary artery disease)    Hypertension    Tear of medial meniscus of knee 05/20/2020   COVID-19 05/2020   Hip pain 10/28/2019   Acute pain of right knee 10/28/2019   Right lower quadrant abdominal pain 10/28/2019   Internal hemorrhoids 04/11/2019   Constipation 04/11/2019   Rectal bleeding 07/13/2017   Arthropathy of left shoulder 07/13/2017   Hypokalemia 02/20/2017   Chest pain 01/17/2017   Adjustment disorder with mixed anxiety and depressed mood 03/25/2013   Epigastric pain 11/12/2012   Nausea alone 11/12/2012   History of colonic polyps 11/12/2012   Hypertension, benign essential, goal below 140/90 10/30/2012   Hyperlipidemia 10/22/2012   GERD (gastroesophageal reflux disease) 10/22/2012   Asthma 10/22/2012   REFERRING PROVIDER: Delynn Flavin DO  REFERRING DIAG: Acute right shoulder pain.  THERAPY DIAG:  Acute pain of right shoulder  Stiffness of right shoulder, not elsewhere classified  Rationale for Evaluation and Treatment: Rehabilitation  ONSET DATE: ~2 months.  SUBJECTIVE:  SUBJECTIVE STATEMENT:  Pt reports 6-7/10 right shoulder pain today.   PERTINENT HISTORY: Bee sting and latex allergy, RCR of left shoulder.  PAIN:  Are you having pain? Yes: NPRS scale: 6-7/10 Pain location: Right shoulder. Pain description: Ache, sharp. Aggravating factors: As above. Relieving factors: as above.  PRECAUTIONS: None   FALLS:  Has patient fallen in last 6 months? No  LIVING ENVIRONMENT: Lives with: lives with their family Lives in: House/apartment Has following equipment at home:  None.  OCCUPATION: Retired.  PLOF: Independent  PATIENT GOALS:Use right shoulder without pain.  NEXT MD VISIT:   OBJECTIVE:  Note: Objective measures were completed at  Evaluation unless otherwise noted.  PATIENT SURVEYS:  FOTO 51.73.   POSTURE: Rounded shoulders.  UPPER EXTREMITY ROM:   Active right shoulder flexion to 130 degrees activeand 150 degrees passive, active ER performed slowly through a full range of motion, behind back to left upper gluteal region.  UPPER EXTREMITY MMT:  IR/ER tested with elbow by side graded grossly at 4- to 4/5. Right shoulder deltoid strength~ 4-/5.  SHOULDER SPECIAL TESTS: Mild pain reproduction with right shoulder impingment testing. (-) Drop Arm test.  PALPATION:  Tender to palpation over right posterior cuff musculature and bicipital groove.   TODAY'S TREATMENT:     RT shldr                   12/21/22                 EXERCISE LOG  Exercise Repetitions and Resistance Comments  UBE 10 mins (5 mins forward/backward)   Pulleys 3 mins   Ranger Flex/ext; CW and CCW circles x2 mines each   Cones to [employer] on Wall 2.5 mins    Blank cell = exercise not performed today   Manual Therapy Soft Tissue Mobilization: Right shoulder, STW/M to right deltoid and bicep to decrease pain and tone    Modalities  Date:  Unattended Estim: Shoulder, IFC 80-150 Hz, 15 mins, Pain Hot Pack: Shoulder, 15 mins, Pain and Tone    UBE x 5 mins  at 120 RPMs Pulleys x 3 mins [place] combo x 10 mins 1.5 w/cm2  to RT shldr posterior cuff Manual STW to RT shldr posterior cuff                                                                                                                                        DATE: HMP and IFC at 80-150 Hz on 40% scan x 15 minutes to patient's right shoulder.  Normal modality response following removal of modality.   PATIENT EDUCATION:   HOME EXERCISE PROGRAM: ASSESSMENT:  CLINICAL IMPRESSION: Pt arrives for today's treatment session reporting 6-7/10 right shoulder pain.  Pt able to tolerate increased time with a few of her exercises today, but continues to be limited by pain.  STW/M  performed  to posterior right shoulder musculature and deltoid to decrease pain and tone.  Normal responses to estim and Mh noted upon removal.  Pt reported 4/10 right shoulder pain at completion of today's treatment session.   OBJECTIVE IMPAIRMENTS: decreased activity tolerance, decreased ROM, decreased strength, increased muscle spasms, and pain.   ACTIVITY LIMITATIONS: carrying and reach over head  PARTICIPATION LIMITATIONS: meal prep, cleaning, and laundry  REHAB POTENTIAL: Good  CLINICAL DECISION MAKING: Stable/uncomplicated  EVALUATION COMPLEXITY: Low   GOALS: LONG TERM GOALS: Target date: 02/07/23.  Ind with a HEP.  Goal status: IN PROGRESS  2.  Active shoulder flexion to 155 degrees so the patient can easily reach overhead.  Goal status: IN PROGRESS  3.  Increase ROM so patient is able to reach behind back to L3.  Goal status: IN PROGRESS  4. Increase right shoulder strength to a solid 4+/5 to increase stability for performance of functional activities.  Goal status: IN PROGRESS  5.  Perform ADL's with pain not > 3/10.  Goal status: IN PROGRESS  PLAN:  PT FREQUENCY: 2x/week  PT DURATION: 6 weeks  PLANNED INTERVENTIONS: 97110-Therapeutic exercises, 97530- Therapeutic activity, 97112- Neuromuscular re-education, 97535- Self Care, 16109- Manual therapy, 97014- Electrical stimulation (unattended), 97016- Vasopneumatic device, 97035- Ultrasound, Dry Needling, Cryotherapy, and Moist heat  PLAN FOR NEXT SESSION: Combo e'stim/US, STW/M, pulleys, wall ladder, supine cane exercises.       Newman Pies, PTA 12/21/2022, 10:35 AM

## 2023-01-05 ENCOUNTER — Ambulatory Visit (AMBULATORY_SURGERY_CENTER): Payer: Medicare HMO

## 2023-01-05 VITALS — Ht 64.0 in | Wt 172.2 lb

## 2023-01-05 DIAGNOSIS — Z1211 Encounter for screening for malignant neoplasm of colon: Secondary | ICD-10-CM

## 2023-01-05 MED ORDER — NA SULFATE-K SULFATE-MG SULF 17.5-3.13-1.6 GM/177ML PO SOLN: 1.0000 | Freq: Once | ORAL | 0 refills | Status: AC

## 2023-01-05 NOTE — Progress Notes (Signed)

## 2023-01-13 IMAGING — MR MR ABDOMEN WO/W CM
19 series · 48 of 48 positions shown · IV contrast (gadavist)
Comparison: CT 04/15/2020 and 07/23/2014

CLINICAL DATA: Left renal mass on CT.

EXAM:
MRI ABDOMEN WITHOUT AND WITH CONTRAST
TECHNIQUE: Multiplanar multisequence MR imaging of the abdomen was performed
both before and after the administration of intravenous contrast.
CONTRAST:  8mL GADAVIST GADOBUTROL 1 MMOL/ML IV SOLN

[Series 3: T2 · coronal · 6.0mm · 1.56mm/px · 2 of 30 slices shown (1 of 2)]
[im 1/30]
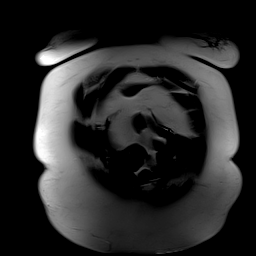
[im 30/30]
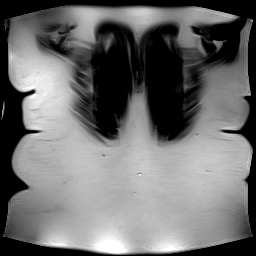

[Series 6: T1 · axial · 3.0mm · 1.25mm/px · z∈[-72,+165]mm · 3 of 80 slices shown (1 of 2)]
[im 1/80]
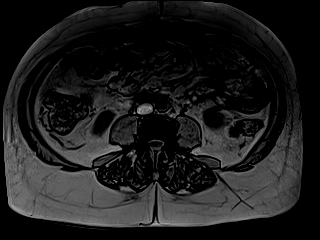
[im 40/80]
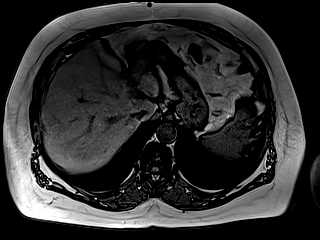
[im 80/80]
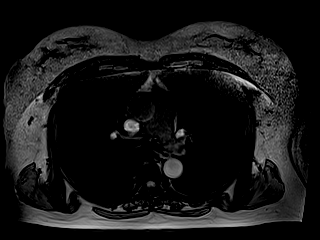

[Series 7: T1 · axial · 3.0mm · 1.25mm/px · z∈[-72,+165]mm · 3 of 80 slices shown (2 of 2)]
[im 1/80]
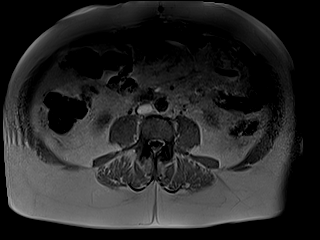
[im 40/80]
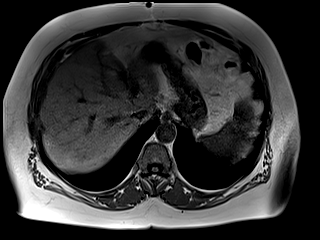
[im 80/80]
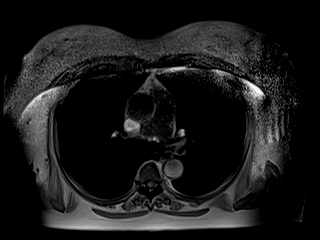

[Series 9: T2 fat-sat · axial · 6.0mm · 1.25mm/px · 1 of 21 slices shown (1 of 2)]
[im 1/21]
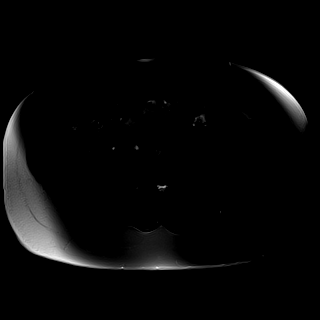

[Series 10: DWI · axial · 6.0mm · 1.49mm/px · z∈[-113,+168]mm · 3 of 80 slices shown (1 of 2)]
[im 1/80]
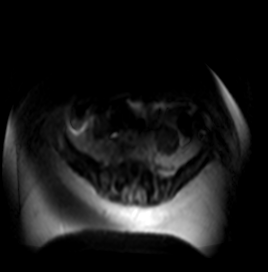
[im 40/80]
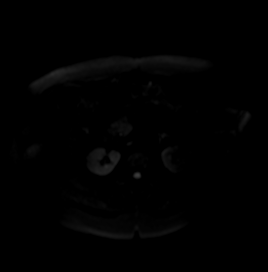
[im 80/80]
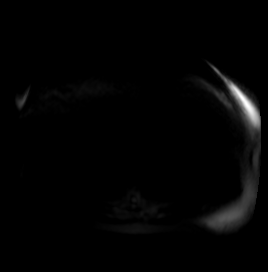

[Series 11: DWI · axial · 6.0mm · 1.49mm/px · z∈[-113,+168]mm · 2 of 40 slices shown (2 of 2)]
[im 1/40]
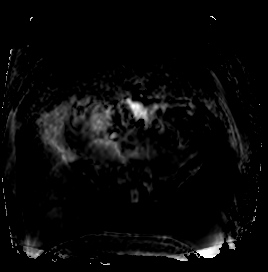
[im 40/40]
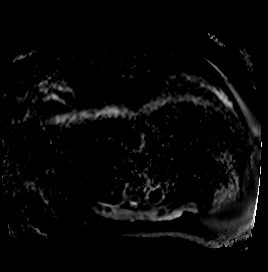

[Series 15: T2 fat-sat · axial · 6.0mm · 1.31mm/px · 1 of 36 slices shown (2 of 2)]
[im 1/36]
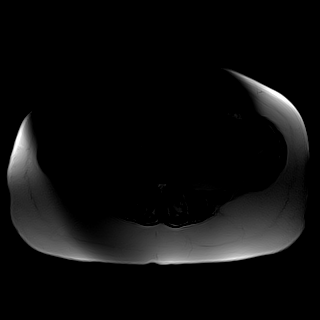

[Series 16: bSSFP · axial · 5.0mm · 0.84mm/px · z∈[-98,+133]mm · 2 of 43 slices shown]
[im 1/43]
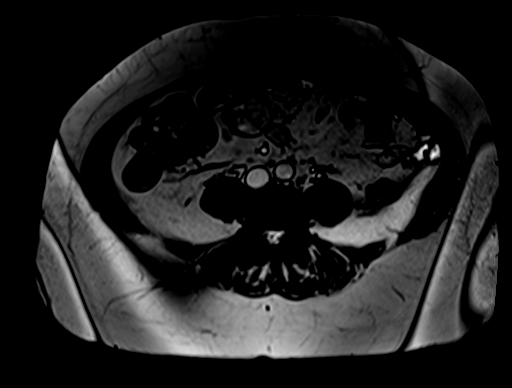
[im 43/43]
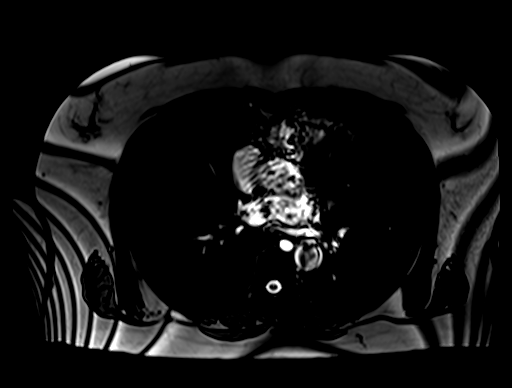

[Series 18: T1 dynamic · axial · 3.0mm · 1.25mm/px · z∈[-102,+135]mm · 3 of 80 slices shown (1 of 10)]
[im 1/80]
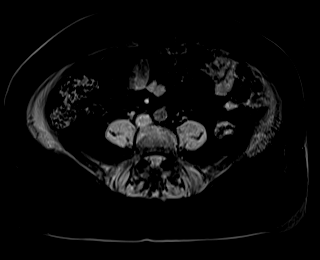
[im 40/80]
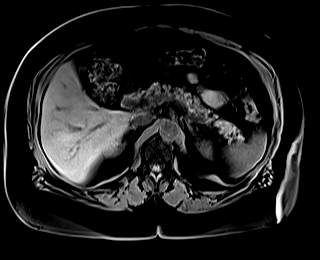
[im 80/80]
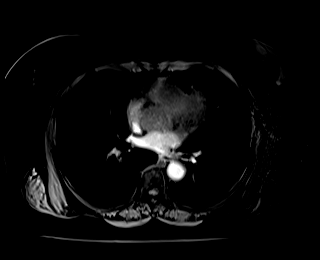

[Series 22: T1 dynamic · axial · 3.0mm · 1.25mm/px · z∈[-102,+135]mm · 3 of 80 slices shown (2 of 10)]
[im 1/80]
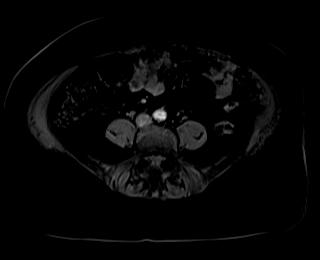
[im 40/80]
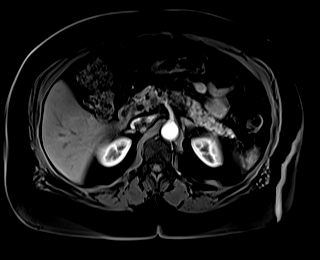
[im 80/80]
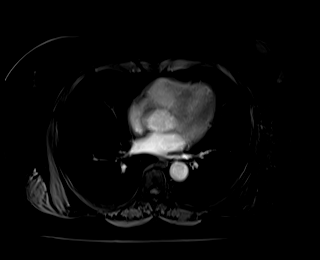

[Series 23: T1 dynamic · axial · 3.0mm · 1.25mm/px · z∈[-102,+135]mm · 3 of 80 slices shown (3 of 10)]
[im 1/80]
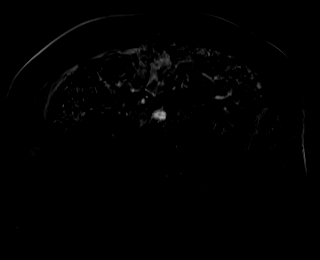
[im 40/80]
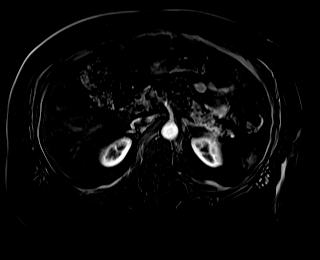
[im 80/80]
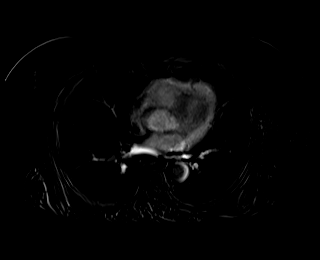

[Series 26: T1 dynamic · axial · 3.0mm · 1.25mm/px · z∈[-102,+135]mm · 3 of 80 slices shown (4 of 10)]
[im 1/80]
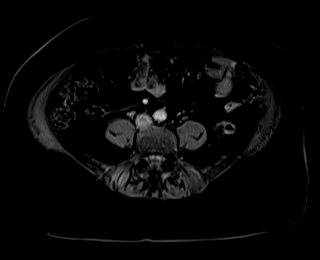
[im 40/80]
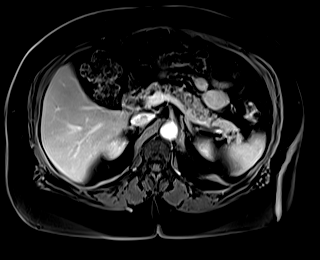
[im 80/80]
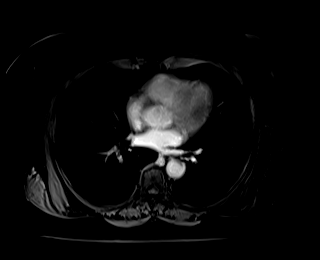

[Series 27: T1 dynamic · axial · 3.0mm · 1.25mm/px · z∈[-102,+135]mm · 3 of 80 slices shown (5 of 10)]
[im 1/80]
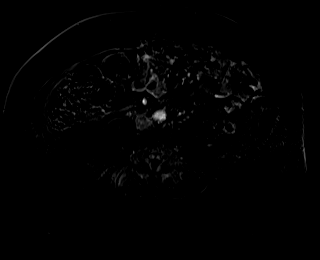
[im 40/80]
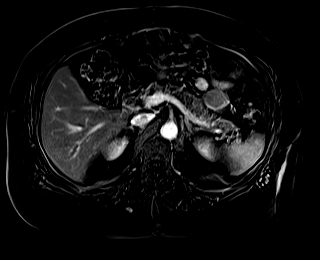
[im 80/80]
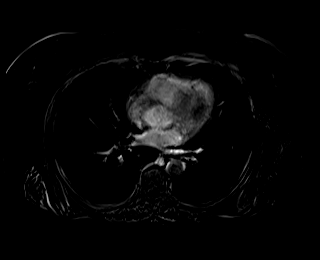

[Series 30: T1 dynamic · axial · 3.0mm · 1.25mm/px · z∈[-102,+135]mm · 3 of 80 slices shown (6 of 10)]
[im 1/80]
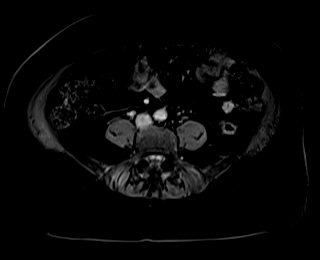
[im 40/80]
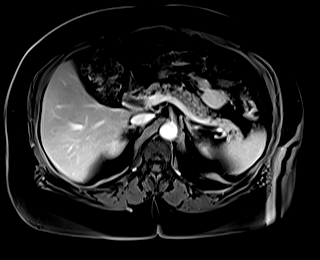
[im 80/80]
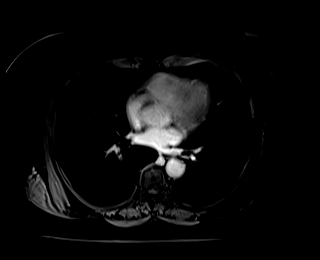

[Series 31: T1 dynamic · axial · 3.0mm · 1.25mm/px · z∈[-102,+135]mm · 3 of 80 slices shown (7 of 10)]
[im 1/80]
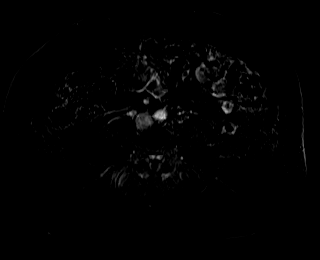
[im 40/80]
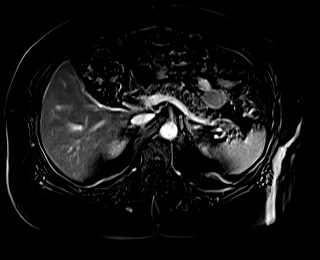
[im 80/80]
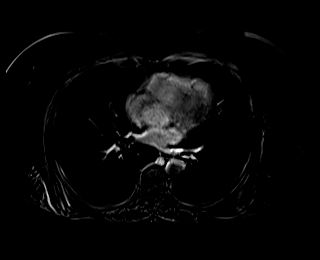

[Series 33: T1 dynamic · coronal · 3.0mm · 1.41mm/px · 3 of 72 slices shown (8 of 10)]
[im 1/72]
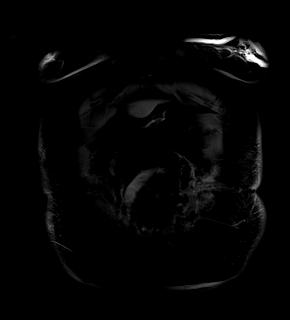
[im 36/72]
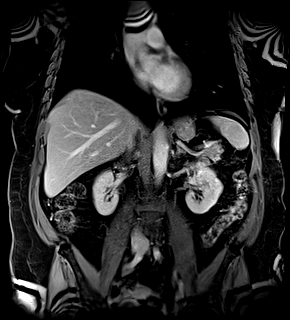
[im 72/72]
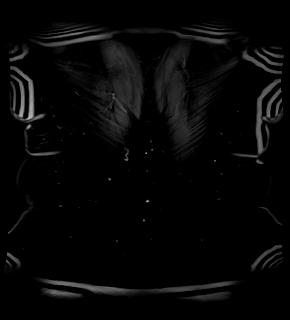

[Series 34: T2 · axial · 6.0mm · 1.56mm/px · 1 of 35 slices shown (2 of 2)]
[im 1/35]
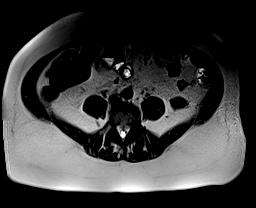

[Series 37: T1 dynamic · axial · 3.0mm · 1.25mm/px · z∈[-102,+135]mm · 3 of 80 slices shown (9 of 10)]
[im 1/80]
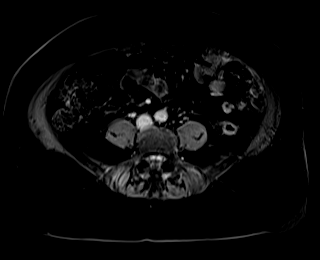
[im 40/80]
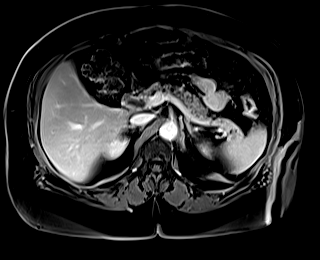
[im 80/80]
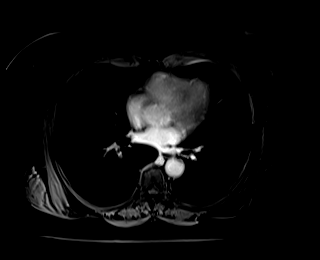

[Series 38: T1 dynamic · axial · 3.0mm · 1.25mm/px · z∈[-102,+135]mm · 3 of 80 slices shown (10 of 10)]
[im 1/80]
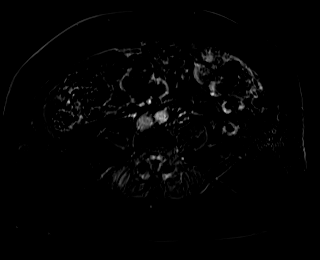
[im 40/80]
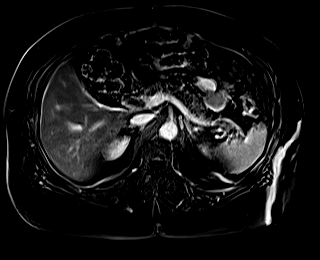
[im 80/80]
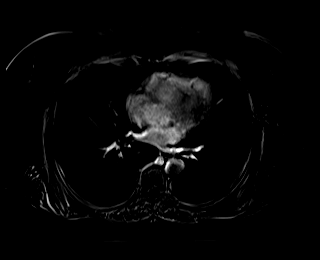

[48 of 48 positions shown; findings below may reference images not displayed]

FINDINGS: Lower chest:  The visualized lower chest appears unremarkable.

Hepatobiliary: No focal hepatic abnormality or abnormal enhancement.
Prior cholecystectomy without significant biliary dilatation.

Pancreas: Unremarkable. No pancreatic ductal dilatation or
surrounding inflammatory changes.

Spleen: Normal in size without focal abnormality.

Adrenals/Urinary Tract: Both adrenal glands appear normal. The
lesion in the interpolar region of the left kidney demonstrates
heterogeneous T2 hyperintensity and heterogeneous enhancement
following contrast, most consistent with renal cell carcinoma. This
measures approximately 1.3 cm on image 56/8. There is a questionable
2nd lesion anteriorly in the upper pole of the left kidney, best
seen on coronal image [DATE], measuring 1.5 cm in diameter. This
lesion is not well seen on the axial images from the MRI, but does
appear as a small hypodense lesion on the delayed post-contrast
images from the recent CT (image [DATE]). This lesion is also likely
enhancing. The right kidney appears normal.

Stomach/Bowel: The stomach appears unremarkable for its degree of
distension. No evidence of bowel wall thickening, distention or
surrounding inflammatory change.

Vascular/Lymphatic: There are no enlarged abdominal lymph nodes. The
renal veins and IVC appear normal. No acute vascular findings. Mild
aortic atherosclerosis.

Other: Postsurgical changes in the anterior abdominal wall. No
ascites.

Musculoskeletal: No acute or significant osseous findings.
IMPRESSION: 1. 1.3 cm hypervascular lesion in the interpolar region of the left
kidney as seen on recent CT, consistent with renal cell carcinoma.
2. Additional subtle lesion in the upper pole of the left kidney,
also likely enhancing, but not well seen on the axial images.
3. No evidence of right renal mass or metastatic disease.

## 2023-01-19 ENCOUNTER — Telehealth: Payer: Self-pay | Admitting: Family Medicine

## 2023-01-19 ENCOUNTER — Ambulatory Visit: Payer: Medicare HMO | Attending: Family Medicine

## 2023-01-19 DIAGNOSIS — M25611 Stiffness of right shoulder, not elsewhere classified: Secondary | ICD-10-CM | POA: Insufficient documentation

## 2023-01-19 DIAGNOSIS — M25511 Pain in right shoulder: Secondary | ICD-10-CM

## 2023-01-19 DIAGNOSIS — Z9889 Other specified postprocedural states: Secondary | ICD-10-CM

## 2023-01-19 NOTE — Telephone Encounter (Signed)
 Patient aware and verbalized understanding.

## 2023-01-19 NOTE — Therapy (Addendum)
 OUTPATIENT PHYSICAL THERAPY SHOULDER TREATMENT   Patient Name: Leah [name] MRN: [id number] DOB:November 17, 1950, 73 y.o., female Today's Date: 01/19/2023  END OF SESSION:  PT End of Session - 01/19/23 0851     Visit Number 7    Number of Visits 12    Date for PT Re-Evaluation 02/07/23    Authorization Type FOTO.    PT Start Time 0845    PT Stop Time [phone]    PT Time Calculation (min) 57 min             Past Medical History:  Diagnosis Date   Allergy     Allergy  to alpha-gal    Anxiety    Arthritis    Asthma    CAD (coronary artery disease)    Colon polyp    COVID-19 virus infection 05/2020   Depression    Elevated CK    Food allergy     GERD (gastroesophageal reflux disease)    Hyperlipidemia    Hypertension    Mild tricuspid stenosis    Renal cell carcinoma (HCC)    Urticaria    Past Surgical History:  Procedure Laterality Date   ABDOMINAL HYSTERECTOMY     Carpel Tunnel Right    CHOLECYSTECTOMY     COLONOSCOPY  02/08/2005   MFM:Pwuzmwjo hemorrhoids otherwise normal rectum/ A few scattered, shallow, left-sided sigmoid diverticula/The remainder of the colonic mucosa appeared normal   COLONOSCOPY WITH ESOPHAGOGASTRODUODENOSCOPY (EGD) N/A 11/29/2012   Procedure: COLONOSCOPY WITH ESOPHAGOGASTRODUODENOSCOPY (EGD);  Surgeon: Lamar CHRISTELLA Hollingshead, MD;  Location: [facility];  Service: Endoscopy;  Laterality: N/A;  10:45   HERNIA REPAIR     ventral X 2 with mesh, last one 2011 ([place]). complicated with 2 week hospital stay   HERNIA REPAIR     with mesh   NISSEN FUNDOPLICATION  1992   POLYPECTOMY     ROTATOR CUFF REPAIR Left 04/04/2019   TUBAL LIGATION     Patient Active Problem List   Diagnosis Date Noted   Elevated CK 11/18/2020   Pain in right shoulder 11/18/2020   Muscle cramps 11/18/2020   Myalgia 11/18/2020   Renal mass 08/17/2020   Bloating 07/02/2020   Flatulence 07/02/2020   Generalized abdominal pain 07/02/2020   Pelvic pain 06/15/2020   Cough  06/07/2020   CAD (coronary artery disease)    Hypertension    Tear of medial meniscus of knee 05/20/2020   COVID-19 05/2020   Hip pain 10/28/2019   Acute pain of right knee 10/28/2019   Right lower quadrant abdominal pain 10/28/2019   Internal hemorrhoids 04/11/2019   Constipation 04/11/2019   Rectal bleeding 07/13/2017   Arthropathy of left shoulder 07/13/2017   Hypokalemia 02/20/2017   Chest pain 01/17/2017   Adjustment disorder with mixed anxiety and depressed mood 03/25/2013   Epigastric pain 11/12/2012   Nausea alone 11/12/2012   History of colonic polyps 11/12/2012   Hypertension, benign essential, goal below 140/90 10/30/2012   Hyperlipidemia 10/22/2012   GERD (gastroesophageal reflux disease) 10/22/2012   Asthma 10/22/2012   REFERRING PROVIDER: Norene Fielding DO  REFERRING DIAG: Acute right shoulder pain.  THERAPY DIAG:  Acute pain of right shoulder  Stiffness of right shoulder, not elsewhere classified  Rationale for Evaluation and Treatment: Rehabilitation  ONSET DATE: ~2 months.  SUBJECTIVE:  SUBJECTIVE STATEMENT:  Pt reports 9/10 right shoulder pain today.     PERTINENT HISTORY: Bee sting and latex allergy , RCR of left shoulder.  PAIN:  Are you having pain? Yes: NPRS scale: 9/10 Pain location: Right shoulder. Pain description: Ache, sharp. Aggravating factors: As above. Relieving factors: as above.  PRECAUTIONS: None   FALLS:  Has patient fallen in last 6 months? No  LIVING ENVIRONMENT: Lives with: lives with their family Lives in: House/apartment Has following equipment at home:  None.  OCCUPATION: Retired.  PLOF: Independent  PATIENT GOALS:Use right shoulder without pain.  NEXT MD VISIT:   OBJECTIVE:  Note: Objective measures were completed at  Evaluation unless otherwise noted.  PATIENT SURVEYS:  FOTO 51.73.   POSTURE: Rounded shoulders.  UPPER EXTREMITY ROM:   Active right shoulder flexion to 130 degrees activeand 150 degrees passive, active ER performed slowly through a full range of motion, behind back to left upper gluteal region.  UPPER EXTREMITY MMT:  IR/ER tested with elbow by side graded grossly at 4- to 4/5. Right shoulder deltoid strength~ 4-/5.  SHOULDER SPECIAL TESTS: Mild pain reproduction with right shoulder impingment testing. (-) Drop Arm test.  PALPATION:  Tender to palpation over right posterior cuff musculature and bicipital groove.   TODAY'S TREATMENT:     RT shldr                   01/19/23                EXERCISE LOG  Exercise Repetitions and Resistance Comments  UBE 10 mins (5 mins forward/backward)   Pulleys 5 mins   Ranger Flex/ext; CW and CCW circles x2 mines each   Cones to [employer] on [employer] cell = exercise not performed today   Manual Therapy Soft Tissue Mobilization: Right shoulder, STW/M to right deltoid and bicep to decrease pain and tone    Modalities  Date:  Unattended Estim: Shoulder, IFC 80-150 Hz, 15 mins, Pain Hot Pack: Shoulder, 15 mins, Pain and Tone    UBE x 5 mins  at 120 RPMs Pulleys x 3 mins US  combo x 10 mins 1.5 w/cm2  to RT shldr posterior cuff Manual STW to RT shldr posterior cuff                                                                                                                                        DATE: HMP and IFC at 80-150 Hz on 40% scan x 15 minutes to patient's right shoulder.  Normal modality response following removal of modality.   PATIENT EDUCATION:   HOME EXERCISE PROGRAM: ASSESSMENT:  CLINICAL IMPRESSION: Pt arrives for today's treatment session reporting 9/10 right shoulder pain.  Pt last seen on 12/5 by this therapy facility.  Pt has made minimal progress towards her goals at this time.  Pt encouraged  to  contact her PCP about contacting ortho.  STW/M performed to right bicep and deltoid to decrease pain and tone with numerous trigger points.  Normal responses to estim and MH noted upon removal.  Pt reported 6/10 right shoulder pain.  Pt wishing to discharge at this time.   OBJECTIVE IMPAIRMENTS: decreased activity tolerance, decreased ROM, decreased strength, increased muscle spasms, and pain.   ACTIVITY LIMITATIONS: carrying and reach over head  PARTICIPATION LIMITATIONS: meal prep, cleaning, and laundry  REHAB POTENTIAL: Good  CLINICAL DECISION MAKING: Stable/uncomplicated  EVALUATION COMPLEXITY: Low   GOALS: LONG TERM GOALS: Target date: 02/07/23.  Ind with a HEP.  1/3: occasionally Goal status: IN PROGRESS  2.  Active shoulder flexion to 155 degrees so the patient can easily reach overhead.   1/3: 92 degrees Goal status: IN PROGRESS  3.  Increase ROM so patient is able to reach behind back to L3.   1/3: left upper glute Goal status: IN PROGRESS  4. Increase right shoulder strength to a solid 4+/5 to increase stability for performance of functional activities.   1/3: 4- to 4/5 right global strength Goal status: IN PROGRESS  5.  Perform ADL's with pain not > 3/10.  Goal status: IN PROGRESS  PLAN:  PT FREQUENCY: 2x/week  PT DURATION: 6 weeks  PLANNED INTERVENTIONS: 97110-Therapeutic exercises, 97530- Therapeutic activity, 97112- Neuromuscular re-education, 97535- Self Care, 02859- Manual therapy, 97014- Electrical stimulation (unattended), 97016- Vasopneumatic device, 97035- Ultrasound, Dry Needling, Cryotherapy, and Moist heat  PLAN FOR NEXT SESSION: Combo e'stim/US , STW/M, pulleys, wall ladder, supine cane exercises.    Delon DELENA Gosling, PTA 01/19/2023, 9:50 AM   .PHYSICAL THERAPY DISCHARGE SUMMARY  Visits from Start of Care: 7.  Current functional level related to goals / functional outcomes: See above.   Remaining deficits: Continued right shoulder pain.    Education / Equipment: HEP.   Patient agrees to discharge. Patient goals were not met. Patient is being discharged due to lack of progress.    Chad Applegate MPT

## 2023-01-19 NOTE — Telephone Encounter (Signed)
 Referral placed  Orders Placed This Encounter  Procedures   Ambulatory referral to Orthopedic Surgery

## 2023-01-19 NOTE — Telephone Encounter (Signed)
 REFERRAL REQUEST Telephone Note  Have you been seen at our office for this problem? Right shoulder (Advise that they may need an appointment with their PCP before a referral can be done)  Reason for Referral: PT told patient there was nothing else they could do Referral discussed with patient: Right shoulder pain  Best contact number of patient for referral team: [phone]    Has patient been seen by a specialist for this issue before: YES  Patient provider preference for referral: Dr. Melita Patient location preference for referral: [facility]   Patient notified that referrals can take up to a week or longer to process. If they haven't heard anything within a week they should call back and speak with the referral department.

## 2023-01-23 ENCOUNTER — Ambulatory Visit: Payer: Medicare HMO | Admitting: Physician Assistant

## 2023-01-24 ENCOUNTER — Encounter: Payer: Self-pay | Admitting: Gastroenterology

## 2023-01-26 ENCOUNTER — Ambulatory Visit (AMBULATORY_SURGERY_CENTER): Payer: Medicare HMO | Admitting: Gastroenterology

## 2023-01-26 ENCOUNTER — Encounter: Payer: Self-pay | Admitting: Gastroenterology

## 2023-01-26 VITALS — BP 125/75 | HR 66 | Temp 98.2°F | Resp 12 | Ht 64.0 in | Wt 172.2 lb

## 2023-01-26 DIAGNOSIS — I2510 Atherosclerotic heart disease of native coronary artery without angina pectoris: Secondary | ICD-10-CM | POA: Diagnosis not present

## 2023-01-26 DIAGNOSIS — K644 Residual hemorrhoidal skin tags: Secondary | ICD-10-CM | POA: Diagnosis not present

## 2023-01-26 DIAGNOSIS — K5730 Diverticulosis of large intestine without perforation or abscess without bleeding: Secondary | ICD-10-CM

## 2023-01-26 DIAGNOSIS — E785 Hyperlipidemia, unspecified: Secondary | ICD-10-CM | POA: Diagnosis not present

## 2023-01-26 DIAGNOSIS — Z1211 Encounter for screening for malignant neoplasm of colon: Secondary | ICD-10-CM | POA: Diagnosis not present

## 2023-01-26 DIAGNOSIS — K641 Second degree hemorrhoids: Secondary | ICD-10-CM

## 2023-01-26 DIAGNOSIS — I10 Essential (primary) hypertension: Secondary | ICD-10-CM | POA: Diagnosis not present

## 2023-01-26 DIAGNOSIS — F419 Anxiety disorder, unspecified: Secondary | ICD-10-CM | POA: Diagnosis not present

## 2023-01-26 DIAGNOSIS — Z860101 Personal history of adenomatous and serrated colon polyps: Secondary | ICD-10-CM | POA: Diagnosis not present

## 2023-01-26 DIAGNOSIS — D122 Benign neoplasm of ascending colon: Secondary | ICD-10-CM | POA: Diagnosis not present

## 2023-01-26 MED ORDER — SODIUM CHLORIDE 0.9 % IV SOLN: 500.0000 mL | Freq: Once | INTRAVENOUS | Status: DC

## 2023-01-26 NOTE — Patient Instructions (Addendum)
 Handouts Provided:  Polyps, Diverticulosis and High Fiber Diet  Use FiberCon 1-2 tablets by mouth daily.  YOU HAD AN ENDOSCOPIC PROCEDURE TODAY AT THE [facility]:   Refer to the procedure report that was given to you for any specific questions about what was found during the examination.  If the procedure report does not answer your questions, please call your gastroenterologist to clarify.  If you requested that your care partner not be given the details of your procedure findings, then the procedure report has been included in a sealed envelope for you to review at your convenience later.  YOU SHOULD EXPECT: Some feelings of bloating in the abdomen. Passage of more gas than usual.  Walking can help get rid of the air that was put into your GI tract during the procedure and reduce the bloating. If you had a lower endoscopy (such as a colonoscopy or flexible sigmoidoscopy) you may notice spotting of blood in your stool or on the toilet paper. If you underwent a bowel prep for your procedure, you may not have a normal bowel movement for a few days.  Please Note:  You might notice some irritation and congestion in your nose or some drainage.  This is from the oxygen used during your procedure.  There is no need for concern and it should clear up in a day or so.  SYMPTOMS TO REPORT IMMEDIATELY:  Following lower endoscopy (colonoscopy or flexible sigmoidoscopy):  Excessive amounts of blood in the stool  Significant tenderness or worsening of abdominal pains  Swelling of the abdomen that is new, acute  Fever of 100F or higher  For urgent or emergent issues, a gastroenterologist can be reached at any hour by calling [phone]. Do not use MyChart messaging for urgent concerns.    DIET:  We do recommend a small meal at first, but then you may proceed to your regular diet.  Drink plenty of fluids but you should avoid alcoholic beverages for 24 hours.  ACTIVITY:  You should plan to  take it easy for the rest of today and you should NOT DRIVE or use heavy machinery until tomorrow (because of the sedation medicines used during the test).    FOLLOW UP: Our staff will call the number listed on your records the next business day following your procedure.  We will call around 7:15- 8:00 am to check on you and address any questions or concerns that you may have regarding the information given to you following your procedure. If we do not reach you, we will leave a message.     If any biopsies were taken you will be contacted by phone or by letter within the next 1-3 weeks.  Please call us  at [phone] if you have not heard about the biopsies in 3 weeks.    SIGNATURES/CONFIDENTIALITY: You and/or your care partner have signed paperwork which will be entered into your electronic medical record.  These signatures attest to the fact that that the information above on your After Visit Summary has been reviewed and is understood.  Full responsibility of the confidentiality of this discharge information lies with you and/or your care-partner.

## 2023-01-26 NOTE — Progress Notes (Signed)
 Pt's states no medical or surgical changes since previsit or office visit.

## 2023-01-26 NOTE — Op Note (Signed)
 [facility] Patient Name: Leah [name] Procedure Date: 01/26/2023 8:16 AM MRN: [id number] Endoscopist: Aloha Finner , MD, [id number] Age: 73 Referring MD:  Date of Birth: [birth year] Gender: Female Account #: [id number] Procedure:                Colonoscopy Indications:              Surveillance: Personal history of adenomatous                            polyps on last colonoscopy 5 years ago Medicines:                Monitored Anesthesia Care Procedure:                Pre-Anesthesia Assessment:                           - Prior to the procedure, a History and Physical                            was performed, and patient medications and                            allergies were reviewed. The patient's tolerance of                            previous anesthesia was also reviewed. The risks                            and benefits of the procedure and the sedation                            options and risks were discussed with the patient.                            All questions were answered, and informed consent                            was obtained. Prior Anticoagulants: The patient has                            taken no anticoagulant or antiplatelet agents                            except for aspirin . ASA Grade Assessment: II - A                            patient with mild systemic disease. After reviewing                            the risks and benefits, the patient was deemed in                            satisfactory condition to undergo the procedure.  After obtaining informed consent, the colonoscope                            was passed under direct vision. Throughout the                            procedure, the patient's blood pressure, pulse, and                            oxygen saturations were monitored continuously. The                            Olympus Scope SN: [id number] was introduced through                             the anus and advanced to the the cecum, identified                            by appendiceal orifice and ileocecal valve. The                            colonoscopy was performed without difficulty. The                            patient tolerated the procedure. The quality of the                            bowel preparation was adequate. The ileocecal                            valve, appendiceal orifice, and rectum were                            photographed. Scope In: 8:30:01 AM Scope Out: 8:45:06 AM Scope Withdrawal Time: 0 hours 11 minutes 59 seconds  Total Procedure Duration: 0 hours 15 minutes 5 seconds  Findings:                 The digital rectal exam findings include                            hemorrhoids. Pertinent negatives include no                            palpable rectal lesions.                           A 5 mm polyp was found in the ascending colon. The                            polyp was sessile. The polyp was removed with a                            cold snare. Resection and retrieval were complete.  Multiple small-mouthed diverticula were found in                            the recto-sigmoid colon, sigmoid colon and                            descending colon.                           Normal mucosa was found in the entire colon.                           Non-bleeding non-thrombosed external and internal                            hemorrhoids were found during retroflexion, during                            perianal exam and during digital exam. The                            hemorrhoids were Grade II (internal hemorrhoids                            that prolapse but reduce spontaneously). Complications:            No immediate complications. Estimated Blood Loss:     Estimated blood loss was minimal. Impression:               - Hemorrhoids found on digital rectal exam.                           - One 5 mm polyp in the ascending colon,  removed                            with a cold snare. Resected and retrieved.                           - Diverticulosis in the recto-sigmoid colon, in the                            sigmoid colon and in the descending colon.                           - Normal mucosa in the entire examined colon                            otherwise.                           - Non-bleeding non-thrombosed external and internal                            hemorrhoids. Recommendation:           - The patient will be observed post-procedure,  until all discharge criteria are met.                           - Discharge patient to home.                           - Patient has a contact number available for                            emergencies. The signs and symptoms of potential                            delayed complications were discussed with the                            patient. Return to normal activities tomorrow.                            Written discharge instructions were provided to the                            patient.                           - High fiber diet.                           - Use FiberCon 1-2 tablets PO daily.                           - Continue present medications.                           - Await pathology results.                           - Repeat colonoscopy in 5-7 years for surveillance                            based on pathology results and previous history of                            adenomatous colon polyps.                           - The findings and recommendations were discussed                            with the patient.                           - The findings and recommendations were discussed                            with the designated responsible adult. Aloha Finner, MD 01/26/2023 8:51:03 AM

## 2023-01-26 NOTE — Progress Notes (Signed)
 Report to PACU, RN, vss, BBS= Clear.

## 2023-01-26 NOTE — Progress Notes (Signed)
 Called to room to assist during endoscopic procedure.  Patient ID and intended procedure confirmed with present staff. Received instructions for my participation in the procedure from the performing physician.

## 2023-01-26 NOTE — Progress Notes (Signed)
 GASTROENTEROLOGY PROCEDURE H&P NOTE   Primary Care Physician: Jolinda Norene HERO, DO  HPI: Leah [name] is a 73 y.o. female who presents for Colonoscopy for surveillance of previous adenomas.  Past Medical History:  Diagnosis Date   Allergy     Allergy  to alpha-gal    Anxiety    Arthritis    Asthma    CAD (coronary artery disease)    Colon polyp    COVID-19 virus infection 05/2020   Depression    Elevated CK    Food allergy     GERD (gastroesophageal reflux disease)    Hyperlipidemia    Hypertension    Mild tricuspid stenosis    Renal cell carcinoma (HCC)    Urticaria    Past Surgical History:  Procedure Laterality Date   ABDOMINAL HYSTERECTOMY     Carpel Tunnel Right    CHOLECYSTECTOMY     COLONOSCOPY  02/08/2005   MFM:Pwuzmwjo hemorrhoids otherwise normal rectum/ A few scattered, shallow, left-sided sigmoid diverticula/The remainder of the colonic mucosa appeared normal   COLONOSCOPY WITH ESOPHAGOGASTRODUODENOSCOPY (EGD) N/A 11/29/2012   Procedure: COLONOSCOPY WITH ESOPHAGOGASTRODUODENOSCOPY (EGD);  Surgeon: Lamar HERO Hollingshead, MD;  Location: [facility];  Service: Endoscopy;  Laterality: N/A;  10:45   HERNIA REPAIR     ventral X 2 with mesh, last one 2011 ([place]). complicated with 2 week hospital stay   HERNIA REPAIR     with mesh   NISSEN FUNDOPLICATION  1992   POLYPECTOMY     ROTATOR CUFF REPAIR Left 04/04/2019   TUBAL LIGATION     Current Outpatient Medications  Medication Sig Dispense Refill   albuterol  (PROAIR  HFA) 108 (90 Base) MCG/ACT inhaler Inhale 2 puffs into the lungs every 4 (four) hours as needed for wheezing or shortness of breath. 8.5 g 3   Albuterol  Sulfate 2.5 MG/0.5ML NEBU Take 0.5 mLs (2.5 mg total) by nebulization every 6 (six) hours as needed. 20 mL 3   aspirin  81 MG tablet Take 81 mg by mouth daily.     calcium  carbonate (TUMS) 500 MG chewable tablet Chew 1 tablet (200 mg of elemental calcium  total) by mouth as needed for  indigestion or heartburn.     Cholecalciferol (VITAMIN D ) 2000 UNITS tablet Take 2,000 Units by mouth daily.     famotidine  (PEPCID ) 40 MG tablet Take 1 tablet (40 mg total) by mouth daily. 90 tablet 3   fish oil-omega-3 fatty acids  1000 MG capsule Take 2 g by mouth daily.      fluticasone  (FLONASE ) 50 MCG/ACT nasal spray Place 2 sprays into both nostrils daily. 16 g 6   losartan -hydrochlorothiazide  (HYZAAR) 100-12.5 MG tablet Take 1 tablet by mouth daily. 90 tablet 3   metFORMIN  (GLUCOPHAGE -XR) 500 MG 24 hr tablet TAKE 1 TABLET BY MOUTH DAILY WITH BREAFAST 90 tablet 1   Multiple Vitamins-Minerals (EMERGEN-C IMMUNE PO) Take by mouth. (Patient not taking: Reported on 01/05/2023)     Polyvinyl Alcohol-Povidone (REFRESH OP) Apply to eye.     rosuvastatin  (CRESTOR ) 10 MG tablet Take 1 tablet (10 mg total) by mouth daily. 90 tablet 3   triamcinolone  cream (KENALOG ) 0.1 % Apply 1 Application topically 2 (two) times daily. 30 g 0   Current Facility-Administered Medications  Medication Dose Route Frequency Provider Last Rate Last Admin   0.9 %  sodium chloride  infusion  500 mL Intravenous Once Mansouraty, Khaleah Duer Jr., MD       methylPREDNISolone  acetate (DEPO-MEDROL ) injection 40 mg  40 mg Intramuscular Once Gottschalk,  Norene HERO, DO        Current Outpatient Medications:    albuterol  (PROAIR  HFA) 108 (90 Base) MCG/ACT inhaler, Inhale 2 puffs into the lungs every 4 (four) hours as needed for wheezing or shortness of breath., Disp: 8.5 g, Rfl: 3   Albuterol  Sulfate 2.5 MG/0.5ML NEBU, Take 0.5 mLs (2.5 mg total) by nebulization every 6 (six) hours as needed., Disp: 20 mL, Rfl: 3   aspirin  81 MG tablet, Take 81 mg by mouth daily., Disp: , Rfl:    calcium  carbonate (TUMS) 500 MG chewable tablet, Chew 1 tablet (200 mg of elemental calcium  total) by mouth as needed for indigestion or heartburn., Disp: , Rfl:    Cholecalciferol (VITAMIN D ) 2000 UNITS tablet, Take 2,000 Units by mouth daily., Disp: , Rfl:     famotidine  (PEPCID ) 40 MG tablet, Take 1 tablet (40 mg total) by mouth daily., Disp: 90 tablet, Rfl: 3   fish oil-omega-3 fatty acids  1000 MG capsule, Take 2 g by mouth daily. , Disp: , Rfl:    fluticasone  (FLONASE ) 50 MCG/ACT nasal spray, Place 2 sprays into both nostrils daily., Disp: 16 g, Rfl: 6   losartan -hydrochlorothiazide  (HYZAAR) 100-12.5 MG tablet, Take 1 tablet by mouth daily., Disp: 90 tablet, Rfl: 3   metFORMIN  (GLUCOPHAGE -XR) 500 MG 24 hr tablet, TAKE 1 TABLET BY MOUTH DAILY WITH BREAFAST, Disp: 90 tablet, Rfl: 1   Multiple Vitamins-Minerals (EMERGEN-C IMMUNE PO), Take by mouth. (Patient not taking: Reported on 01/05/2023), Disp: , Rfl:    Polyvinyl Alcohol-Povidone (REFRESH OP), Apply to eye., Disp: , Rfl:    rosuvastatin  (CRESTOR ) 10 MG tablet, Take 1 tablet (10 mg total) by mouth daily., Disp: 90 tablet, Rfl: 3   triamcinolone  cream (KENALOG ) 0.1 %, Apply 1 Application topically 2 (two) times daily., Disp: 30 g, Rfl: 0  Current Facility-Administered Medications:    0.9 %  sodium chloride  infusion, 500 mL, Intravenous, Once, Mansouraty, Jozie Wulf Jr., MD   methylPREDNISolone  acetate (DEPO-MEDROL ) injection 40 mg, 40 mg, Intramuscular, Once, Gottschalk, Ashly M, DO Allergies  Allergen Reactions   Beef Allergy  Anaphylaxis   Pork Allergy  Anaphylaxis and Other (See Comments)   Beef (Bovine) Protein Other (See Comments)    Muscle aches   Codeine Nausea And Vomiting   Levaquin  [Levofloxacin ] Other (See Comments)    Achilles tendon pain   Meat [Alpha-Gal] Other (See Comments)    Muscle Aches   Penicillins Other (See Comments)    Does not know   Praluent [Alirocumab]     Rash and myalgias   Strawberry (Diagnostic) Itching   Strawberry Extract Itching and Other (See Comments)   Family History  Problem Relation Age of Onset   Heart disease Mother    Alzheimer's disease Mother    Diabetes Mother    Other Father        deceased age 62 from some sort of GI problems but no  malignancy   Diabetes Sister    Asthma Sister    Colon polyps Sister    Leukemia Brother        CLL   Acute lymphoblastic leukemia Brother    Diabetes Brother    Diabetes Daughter    Myasthenia gravis Daughter    Pulmonary embolism Daughter    Healthy Son    Healthy Son    Cancer Cousin        pancreatic   Cancer Cousin        pancreatic   Colon cancer Neg Hx  Stomach cancer Neg Hx    Esophageal cancer Neg Hx    Rectal cancer Neg Hx    Social History   Socioeconomic History   Marital status: Divorced    Spouse name: Not on file   Number of children: 3   Years of education: 12+   Highest education level: Some college, no degree  Occupational History   Occupation: private duty engineer, manufacturing systems  Tobacco Use   Smoking status: Former    Current packs/day: 0.00    Average packs/day: 0.5 packs/day for 28.0 years (14.0 ttl pk-yrs)    Types: Cigarettes    Start date: 01/17/1968    Quit date: 01/17/1996    Years since quitting: 27.0   Smokeless tobacco: Never  Vaping Use   Vaping status: Never Used  Substance and Sexual Activity   Alcohol use: Yes    Alcohol/week: 2.0 standard drinks of alcohol    Types: 2 Glasses of wine per week    Comment: occasional use of wine    Drug use: No   Sexual activity: Not Currently    Partners: Male    Birth control/protection: Surgical  Other Topics Concern   Not on file  Social History Narrative   Lives alone.    Social Drivers of Corporate Investment Banker Strain: Low Risk  (08/11/2022)   Overall Financial Resource Strain (CARDIA)    Difficulty of Paying Living Expenses: Not hard at all  Food Insecurity: No Food Insecurity (08/11/2022)   Hunger Vital Sign    Worried About Running Out of Food in the Last Year: Never true    Ran Out of Food in the Last Year: Never true  Transportation Needs: No Transportation Needs (08/11/2022)   PRAPARE - Administrator, Civil Service (Medical): No    Lack of Transportation  (Non-Medical): No  Physical Activity: Insufficiently Active (08/11/2022)   Exercise Vital Sign    Days of Exercise per Week: 3 days    Minutes of Exercise per Session: 30 min  Stress: No Stress Concern Present (08/11/2022)   [employer] of Occupational Health - Occupational Stress Questionnaire    Feeling of Stress : Not at all  Social Connections: Moderately Isolated (08/11/2022)   Social Connection and Isolation Panel [NHANES]    Frequency of Communication with Friends and Family: More than three times a week    Frequency of Social Gatherings with Friends and Family: More than three times a week    Attends Religious Services: More than 4 times per year    Active Member of [employer] or Organizations: No    Attends Banker Meetings: Never    Marital Status: Never married  Intimate Partner Violence: Not At Risk (08/11/2022)   Humiliation, Afraid, Rape, and Kick questionnaire    Fear of Current or Ex-Partner: No    Emotionally Abused: No    Physically Abused: No    Sexually Abused: No    Physical Exam: Today's Vitals   01/26/23 0743  BP: 128/73  Pulse: 78  Temp: 98.2 F (36.8 C)  TempSrc: Skin  SpO2: 100%  Weight: 172 lb 3.2 oz (78.1 kg)  Height: 5' 4 (1.626 m)   Body mass index is 29.56 kg/m. GEN: NAD EYE: Sclerae anicteric ENT: MMM CV: Non-tachycardic GI: Soft, NT/ND NEURO:  Alert & Oriented x 3  Lab Results: No results for input(s): WBC, HGB, HCT, PLT in the last 72 hours. BMET No results for input(s): NA, K, CL, CO2, GLUCOSE,  BUN, CREATININE, CALCIUM  in the last 72 hours. LFT No results for input(s): PROT, ALBUMIN, AST, ALT, ALKPHOS, BILITOT, BILIDIR, IBILI in the last 72 hours. PT/INR No results for input(s): LABPROT, INR in the last 72 hours.   Impression / Plan: This is a 73 y.o.female who presents for Colonoscopy for surveillance of previous adenomas.  The risks and benefits of endoscopic  evaluation/treatment were discussed with the patient and/or family; these include but are not limited to the risk of perforation, infection, bleeding, missed lesions, lack of diagnosis, severe illness requiring hospitalization, as well as anesthesia and sedation related illnesses.  The patient's history has been reviewed, patient examined, no change in status, and deemed stable for procedure.  The patient and/or family is agreeable to proceed.    Aloha Finner, MD [facility] Office # [phone]

## 2023-01-29 ENCOUNTER — Telehealth: Payer: Self-pay

## 2023-01-29 NOTE — Telephone Encounter (Signed)
 Attempted to reach patient for post-procedure f/u call. No answer. Unable to leave message as her voicemail box is not set up yet.

## 2023-01-30 LAB — SURGICAL PATHOLOGY

## 2023-01-31 ENCOUNTER — Encounter: Payer: Self-pay | Admitting: Gastroenterology

## 2023-02-16 DIAGNOSIS — M25511 Pain in right shoulder: Secondary | ICD-10-CM | POA: Diagnosis not present

## 2023-03-12 ENCOUNTER — Ambulatory Visit (INDEPENDENT_AMBULATORY_CARE_PROVIDER_SITE_OTHER): Payer: Medicare HMO | Admitting: Nurse Practitioner

## 2023-03-12 ENCOUNTER — Ambulatory Visit: Payer: Medicare HMO | Admitting: Nurse Practitioner

## 2023-03-12 ENCOUNTER — Encounter: Payer: Self-pay | Admitting: Nurse Practitioner

## 2023-03-12 VITALS — BP 119/71 | HR 77 | Temp 98.1°F | Ht 64.0 in | Wt 176.0 lb

## 2023-03-12 DIAGNOSIS — J069 Acute upper respiratory infection, unspecified: Secondary | ICD-10-CM | POA: Diagnosis not present

## 2023-03-12 NOTE — Patient Instructions (Signed)
 1. Take meds as prescribed 2. Use a cool mist humidifier especially during the winter months and when heat has been humid. 3. Use saline nose sprays frequently 4. Saline irrigations of the nose can be very helpful if done frequently.  * 4X daily for 1 week*  * Use of a nettie pot can be helpful with this. Follow directions with this* 5. Drink plenty of fluids 6. Keep thermostat turn down low 7.For any cough or congestion- norel AD samples 8. For fever or aces or pains- take tylenol or ibuprofen appropriate for age and weight.  * for fevers greater than 101 orally you may alternate ibuprofen and tylenol every  3 hours.

## 2023-03-12 NOTE — Progress Notes (Signed)
 Subjective:    Patient ID: Leah [name], female    DOB: 1950/02/04, 73 y.o.   MRN: [id number]   Chief Complaint: head congestion (No Fever. Since Friday/)   URI  This is a new problem. The current episode started in the past 7 days. The problem has been gradually improving. There has been no fever. Associated symptoms include congestion, coughing, rhinorrhea and sinus pain. She has tried acetaminophen for the symptoms. The treatment provided mild relief.    Patient Active Problem List   Diagnosis Date Noted   Elevated CK 11/18/2020   Pain in right shoulder 11/18/2020   Muscle cramps 11/18/2020   Myalgia 11/18/2020   Renal mass 08/17/2020   Bloating 07/02/2020   Flatulence 07/02/2020   Generalized abdominal pain 07/02/2020   Pelvic pain 06/15/2020   Cough 06/07/2020   CAD (coronary artery disease)    Hypertension    Tear of medial meniscus of knee 05/20/2020   COVID-19 05/2020   Hip pain 10/28/2019   Acute pain of right knee 10/28/2019   Right lower quadrant abdominal pain 10/28/2019   Internal hemorrhoids 04/11/2019   Constipation 04/11/2019   Rectal bleeding 07/13/2017   Arthropathy of left shoulder 07/13/2017   Hypokalemia 02/20/2017   Chest pain 01/17/2017   Adjustment disorder with mixed anxiety and depressed mood 03/25/2013   Epigastric pain 11/12/2012   Nausea alone 11/12/2012   History of colonic polyps 11/12/2012   Hypertension, benign essential, goal below 140/90 10/30/2012   Hyperlipidemia 10/22/2012   GERD (gastroesophageal reflux disease) 10/22/2012   Asthma 10/22/2012       Review of Systems  Constitutional:  Negative for chills and fever.  HENT:  Positive for congestion, rhinorrhea and sinus pain.   Respiratory:  Positive for cough.        Objective:   Physical Exam Constitutional:      Appearance: Normal appearance.  HENT:     Right Ear: Tympanic membrane normal.     Left Ear: Tympanic membrane normal.     Nose: Congestion and  rhinorrhea present.     Mouth/Throat:     Pharynx: No oropharyngeal exudate or posterior oropharyngeal erythema.  Cardiovascular:     Rate and Rhythm: Normal rate and regular rhythm.     Heart sounds: Normal heart sounds.  Pulmonary:     Effort: Pulmonary effort is normal.     Breath sounds: Normal breath sounds.  Skin:    General: Skin is warm.  Neurological:     General: No focal deficit present.     Mental Status: She is alert and oriented to person, place, and time.  Psychiatric:        Mood and Affect: Mood normal.        Behavior: Behavior normal.    BP 119/71   Pulse 77   Temp 98.1 F (36.7 C) (Temporal)   Ht 5\' 4"  (1.626 m)   Wt 176 lb (79.8 kg)   SpO2 97%   BMI 30.21 kg/m         Assessment & Plan:   Leah [name] in today with chief complaint of head congestion (No Fever. Since Friday/)   1. URI with cough and congestion (Primary) 1. Take meds as prescribed 2. Use a cool mist humidifier especially during the winter months and when heat has been humid. 3. Use saline nose sprays frequently 4. Saline irrigations of the nose can be very helpful if done frequently.  * 4X daily for 1 week*  *  Use of a nettie pot can be helpful with this. Follow directions with this* 5. Drink plenty of fluids 6. Keep thermostat turn down low 7.For any cough or congestion- norel AD 8. For fever or aces or pains- take tylenol or ibuprofen appropriate for age and weight.  * for fevers greater than 101 orally you may alternate ibuprofen and tylenol every  3 hours.       The above assessment and management plan was discussed with the patient. The patient verbalized understanding of and has agreed to the management plan. Patient is aware to call the clinic if symptoms persist or worsen. Patient is aware when to return to the clinic for a follow-up visit. Patient educated on when it is appropriate to go to the emergency department.   Mary-Margaret Daphine Deutscher, FNP

## 2023-04-04 ENCOUNTER — Ambulatory Visit: Admitting: Family Medicine

## 2023-04-04 ENCOUNTER — Ambulatory Visit: Payer: Self-pay | Admitting: Family Medicine

## 2023-04-04 ENCOUNTER — Encounter: Payer: Self-pay | Admitting: Family Medicine

## 2023-04-04 VITALS — BP 129/79 | HR 67 | Temp 97.2°F | Ht 64.0 in | Wt 175.0 lb

## 2023-04-04 DIAGNOSIS — W57XXXA Bitten or stung by nonvenomous insect and other nonvenomous arthropods, initial encounter: Secondary | ICD-10-CM

## 2023-04-04 DIAGNOSIS — S61211A Laceration without foreign body of left index finger without damage to nail, initial encounter: Secondary | ICD-10-CM

## 2023-04-04 DIAGNOSIS — S30861A Insect bite (nonvenomous) of abdominal wall, initial encounter: Secondary | ICD-10-CM

## 2023-04-04 DIAGNOSIS — Z23 Encounter for immunization: Secondary | ICD-10-CM

## 2023-04-04 NOTE — Telephone Encounter (Addendum)
 Copied from CRM [id number]. Topic: Clinical - Red Word Triage >> Apr 04, 2023  8:09 AM Leah [name] wrote: Red Word that prompted transfer to Nurse Triage: Patient cut her finger and it won't stop bleeding on left hand. Callback number is [phone]    Chief Complaint: Finger laceration Symptoms: cut on left index finger with bleeding Frequency: Ongoing for about 30 minutes Pertinent Negatives: Patient denies pain Disposition: [] ED /[] Urgent Care (no appt availability in office) / [x] Appointment(In office/virtual)/ []  [facility]/ [] Home Care/ [] Refused Recommended Disposition /[] [facility]/ []  Follow-up with PCP Additional Notes: Patient stated she accidentally cut her left index finger while cutting meat. She is not experiencing any pain. She is able to move and bend the finger like normal. The finger has been bleeding. She has been applying pressure with gauze and it is continuing to bleed.   Consulted with clinic access line to verify if they can see patients for finger lacerations and suture application if necessary and the representative confirmed that they can. Appointment scheduled.     Reason for Disposition  [1] Bleeding AND [2] won't stop after 10 minutes of direct pressure (using correct technique)  Answer Assessment - Initial Assessment Questions 1. MECHANISM: "How did the injury happen?"      Cut finger while cutting meat  2. ONSET: "When did the injury happen?" (Minutes or hours ago)      30 min ago  3. LOCATION: "What part of the finger is injured?" "Is the nail damaged?"      Left index finger   4. SEVERITY: "Can you use the hand normally?"  "Can you bend your fingers into a ball and then fully open them?"     Patient can move and bend the finger like usual  5. SIZE: For cuts, bruises, or swelling, ask: "How large is it?" (e.g., inches or centimeters;  entire finger)      Inch long cut and patient thinks it appears to be deep  6. PAIN: "Is there  pain?" If Yes, ask: "How bad is the pain?"    (e.g., Scale 1-10; or mild, moderate, severe)  - NONE (0): no pain.  - MILD (1-3): doesn't interfere with normal activities.   - MODERATE (4-7): interferes with normal activities or awakens from sleep.  - SEVERE (8-10): excruciating pain, unable to hold a glass of water or bend finger even a little.     No  7. OTHER SYMPTOMS: "Do you have any other symptoms?"     Patient does not think there is swelling or other symptoms  Protocols used: Finger Injury-A-AH

## 2023-04-04 NOTE — Progress Notes (Signed)
 Subjective:  Patient ID: Leah [name], female    DOB: [birth year], 73 y.o.   MRN: [id number]  Patient Care Team: Raliegh Ip, DO as PCP - General (Family Medicine) Kathleene Hazel, MD as PCP - Cardiology (Cardiology) Rachael Fee, MD (Inactive) as Attending Physician (Gastroenterology) Ronne Binning Mardene Celeste, MD as Consulting Physician (Urology) Beverely Low, MD as Consulting Physician (Orthopedic Surgery) Sinda Du, MD as Consulting Physician (Ophthalmology)   Chief Complaint:  Laceration (Finger laceration left index finger.  Patient states she was cutting meat 45 mins ago and cut her finger. )   HPI: Leah [name] is a 73 y.o. female presenting on 04/04/2023 for Laceration (Finger laceration left index finger.  Patient states she was cutting meat 45 mins ago and cut her finger. )   Discussed the use of AI scribe software for clinical note transcription with the patient, who gave verbal consent to proceed.  History of Present Illness   Leah [name] is a 73 year old female who presents with a laceration on her left pointer finger.  She sustained the laceration while cutting meat approximately 45 minutes prior to the visit. The bleeding has not stopped despite applying gauze to the wound. She is frustrated by the incident.  She has a history of alpha-gal syndrome and has been applying CeraVe healing ointment to a spot where she previously had surgery. No bull's eye rash is present around the area.          Relevant past medical, surgical, family, and social history reviewed and updated as indicated.  Allergies and medications reviewed and updated. Data reviewed: Chart in Epic.   Past Medical History:  Diagnosis Date   Allergy    Allergy to alpha-gal    Anxiety    Arthritis    Asthma    CAD (coronary artery disease)    Colon polyp    COVID-19 virus infection 05/2020   Depression    Elevated CK    Food allergy    GERD  (gastroesophageal reflux disease)    Hyperlipidemia    Hypertension    Mild tricuspid stenosis    Renal cell carcinoma (HCC)    Urticaria     Past Surgical History:  Procedure Laterality Date   ABDOMINAL HYSTERECTOMY     Carpel Tunnel Right    CHOLECYSTECTOMY     COLONOSCOPY  02/08/2005   WUJ:[id number] hemorrhoids otherwise normal rectum/ A few scattered, shallow, left-sided sigmoid diverticula/The remainder of the colonic mucosa appeared normal   COLONOSCOPY WITH ESOPHAGOGASTRODUODENOSCOPY (EGD) N/A 11/29/2012   Procedure: COLONOSCOPY WITH ESOPHAGOGASTRODUODENOSCOPY (EGD);  Surgeon: Corbin Ade, MD;  Location: [facility];  Service: Endoscopy;  Laterality: N/A;  10:45   HERNIA REPAIR     ventral X 2 with mesh, last one 2011 ([place]). complicated with 2 week hospital stay   HERNIA REPAIR     with mesh   NISSEN FUNDOPLICATION  1992   POLYPECTOMY     ROTATOR CUFF REPAIR Left 04/04/2019   TUBAL LIGATION      Social History   Socioeconomic History   Marital status: Divorced    Spouse name: Not on file   Number of children: 3   Years of education: 12+   Highest education level: Some college, no degree  Occupational History   Occupation: private duty Engineer, manufacturing systems  Tobacco Use   Smoking status: Former    Current packs/day: 0.00    Average packs/day: 0.5 packs/day for 28.0  years (14.0 ttl pk-yrs)    Types: Cigarettes    Start date: 01/17/1968    Quit date: 01/17/1996    Years since quitting: 27.2   Smokeless tobacco: Never  Vaping Use   Vaping status: Never Used  Substance and Sexual Activity   Alcohol use: Yes    Alcohol/week: 2.0 standard drinks of alcohol    Types: 2 Glasses of wine per week    Comment: occasional use of wine    Drug use: No   Sexual activity: Not Currently    Partners: Male    Birth control/protection: Surgical  Other Topics Concern   Not on file  Social History Narrative   Lives alone.    Social Drivers of Research scientist (physical sciences) Strain: Low Risk  (08/11/2022)   Overall Financial Resource Strain (CARDIA)    Difficulty of Paying Living Expenses: Not hard at all  Food Insecurity: No Food Insecurity (08/11/2022)   Hunger Vital Sign    Worried About Running Out of Food in the Last Year: Never true    Ran Out of Food in the Last Year: Never true  Transportation Needs: No Transportation Needs (08/11/2022)   PRAPARE - Administrator, Civil Service (Medical): No    Lack of Transportation (Non-Medical): No  Physical Activity: Insufficiently Active (08/11/2022)   Exercise Vital Sign    Days of Exercise per Week: 3 days    Minutes of Exercise per Session: 30 min  Stress: No Stress Concern Present (08/11/2022)   [employer] of Occupational Health - Occupational Stress Questionnaire    Feeling of Stress : Not at all  Social Connections: Moderately Isolated (08/11/2022)   Social Connection and Isolation Panel [NHANES]    Frequency of Communication with Friends and Family: More than three times a week    Frequency of Social Gatherings with Friends and Family: More than three times a week    Attends Religious Services: More than 4 times per year    Active Member of [employer] or Organizations: No    Attends Banker Meetings: Never    Marital Status: Never married  Intimate Partner Violence: Not At Risk (08/11/2022)   Humiliation, Afraid, Rape, and Kick questionnaire    Fear of Current or Ex-Partner: No    Emotionally Abused: No    Physically Abused: No    Sexually Abused: No    Outpatient Encounter Medications as of 04/04/2023  Medication Sig   albuterol (PROAIR HFA) 108 (90 Base) MCG/ACT inhaler Inhale 2 puffs into the lungs every 4 (four) hours as needed for wheezing or shortness of breath.   Albuterol Sulfate 2.5 MG/0.5ML NEBU Take 0.5 mLs (2.5 mg total) by nebulization every 6 (six) hours as needed.   aspirin 81 MG tablet Take 81 mg by mouth daily.   calcium carbonate (TUMS) 500 MG  chewable tablet Chew 1 tablet (200 mg of elemental calcium total) by mouth as needed for indigestion or heartburn.   Cholecalciferol (VITAMIN D) 2000 UNITS tablet Take 2,000 Units by mouth daily.   famotidine (PEPCID) 40 MG tablet Take 1 tablet (40 mg total) by mouth daily.   fish oil-omega-3 fatty acids 1000 MG capsule Take 2 g by mouth daily.    fluticasone (FLONASE) 50 MCG/ACT nasal spray Place 2 sprays into both nostrils daily.   losartan-hydrochlorothiazide (HYZAAR) 100-12.5 MG tablet Take 1 tablet by mouth daily.   metFORMIN (GLUCOPHAGE-XR) 500 MG 24 hr tablet TAKE 1 TABLET BY MOUTH DAILY WITH  BREAFAST   Multiple Vitamins-Minerals (EMERGEN-C IMMUNE PO) Take by mouth.   Polyvinyl Alcohol-Povidone (REFRESH OP) Apply to eye.   rosuvastatin (CRESTOR) 10 MG tablet Take 1 tablet (10 mg total) by mouth daily.   triamcinolone cream (KENALOG) 0.1 % Apply 1 Application topically 2 (two) times daily.   No facility-administered encounter medications on file as of 04/04/2023.    Allergies  Allergen Reactions   Beef Allergy Anaphylaxis   Pork Allergy Anaphylaxis and Other (See Comments)   Beef (Bovine) Protein Other (See Comments)    Muscle aches   Codeine Nausea And Vomiting   Levaquin [Levofloxacin] Other (See Comments)    Achilles tendon pain   Meat [Alpha-Gal] Other (See Comments)    Muscle Aches   Penicillins Other (See Comments)    Does not know   Praluent [Alirocumab] Dermatitis    Rash and myalgias   Strawberry (Diagnostic) Itching   Strawberry Extract Itching and Other (See Comments)    Pertinent ROS per HPI, otherwise unremarkable      Objective:  BP 129/79   Pulse 67   Temp (!) 97.2 F (36.2 C)   Ht 5\' 4"  (1.626 m)   Wt 175 lb (79.4 kg)   SpO2 98%   BMI 30.04 kg/m    Wt Readings from Last 3 Encounters:  04/04/23 175 lb (79.4 kg)  03/12/23 176 lb (79.8 kg)  01/26/23 172 lb 3.2 oz (78.1 kg)   Physical Exam Vitals and nursing note reviewed.  Constitutional:       General: She is not in acute distress.    Appearance: Normal appearance. She is obese. She is not ill-appearing, toxic-appearing or diaphoretic.  HENT:     Head: Normocephalic and atraumatic.     Nose: Nose normal.     Mouth/Throat:     Mouth: Mucous membranes are moist.  Eyes:     Conjunctiva/sclera: Conjunctivae normal.     Pupils: Pupils are equal, round, and reactive to light.  Cardiovascular:     Rate and Rhythm: Normal rate and regular rhythm.     Pulses: Normal pulses.     Heart sounds: Normal heart sounds.  Pulmonary:     Effort: Pulmonary effort is normal.     Breath sounds: Normal breath sounds.  Skin:    General: Skin is warm and dry.     Capillary Refill: Capillary refill takes less than 2 seconds.     Findings: Laceration and wound present.       Neurological:     General: No focal deficit present.     Mental Status: She is alert and oriented to person, place, and time.  Psychiatric:        Mood and Affect: Mood normal.        Behavior: Behavior normal.        Thought Content: Thought content normal.        Judgment: Judgment normal.      Laceration repair  Date/Time: 04/04/2023 9:42 AM  Performed by: Sonny Masters, FNP Authorized by: Sonny Masters, FNP   Consent:    Consent obtained:  Verbal   Consent given by:  Patient   Risks, benefits, and alternatives were discussed: yes     Risks discussed:  Infection, pain, retained foreign body, tendon damage, poor cosmetic result, need for additional repair, nerve damage, poor wound healing and vascular damage   Alternatives discussed:  No treatment, delayed treatment, observation and referral Universal protocol:    Procedure explained and  questions answered to patient or proxy's satisfaction: yes     Relevant documents present and verified: yes     Immediately prior to procedure, a time out was called: yes     Patient identity confirmed:  Verbally with patient Anesthesia:    Anesthesia method:  Nerve  block   Block location:  Proximal second digit   Block needle gauge:  25 G   Block anesthetic:  Lidocaine 1% w/o epi   Block technique:  Digital nerve block   Block injection procedure:  Anatomic landmarks identified, introduced needle, incremental injection, anatomic landmarks palpated and negative aspiration for blood   Block outcome:  Anesthesia achieved Laceration details:    Location:  Finger   Finger location:  L index finger   Length (cm):  1.5   Depth (mm):  1 Pre-procedure details:    Preparation:  Patient was prepped and draped in usual sterile fashion Exploration:    Limited defect created (wound extended): yes     Hemostasis achieved with:  Direct pressure and tied off vessels   Wound exploration: wound explored through full range of motion and entire depth of wound probed and visualized     Contaminated: no   Treatment:    Area cleansed with:  Povidone-iodine and chlorhexidine   Amount of cleaning:  Extensive   Irrigation solution:  Sterile saline   Irrigation volume:  50 ml   Irrigation method:  Syringe   Visualized foreign bodies/material removed: no     Debridement:  None   Undermining:  None   Scar revision: no   Skin repair:    Repair method:  Sutures   Suture size:  4-0   Suture material:  Nylon   Suture technique:  Simple interrupted   Number of sutures:  5 Approximation:    Approximation:  Close Repair type:    Repair type:  Simple Post-procedure details:    Dressing:  Antibiotic ointment and bulky dressing   Procedure completion:  Tolerated well, no immediate complications    Results for orders placed or performed in visit on 01/26/23  Surgical pathology ([facility])   Collection Time: 01/26/23 12:00 AM  Result Value Ref Range   SURGICAL PATHOLOGY      SURGICAL PATHOLOGY [facility] [address] [place], [place] [address] Telephone [phone] or [phone] Fax [phone]  REPORT OF SURGICAL  PATHOLOGY   Accession #: [id number] Patient Name: LORREN, ROSSETTI Visit # : [id number]  MRN: [id number] Physician: Corliss Parish DOB/Age [birth year] (Age: 72) Gender: F Collected Date: 01/26/2023 Received Date: 01/29/2023  FINAL DIAGNOSIS       1. Surgical [P], colon, ascending, polyp (1) :       TUBULAR ADENOMA.      NEGATIVE FOR HIGH-GRADE DYSPLASIA.       DATE SIGNED OUT: 01/30/2023 ELECTRONIC SIGNATURE : Lance Coon Md, Pathologist, Electronic Signature  MICROSCOPIC DESCRIPTION  CASE COMMENTS STAINS USED IN DIAGNOSIS: H&E    CLINICAL HISTORY  SPECIMEN(S) OBTAINED 1. Surgical [P], Colon, Ascending, Polyp (1)  SPECIMEN COMMENTS: 1. Special screening for malignant neoplasms, colon; benign neoplasm of ascending colon SPECIMEN CLINICAL INFORMATION: 1. R/O aden oma    Gross Description 1. Received in formalin are tan, soft tissue fragments that are submitted in toto. Number: 1 Size: 0.7 cm, (1B) ( TA )        Report signed out from the following location(s) [facility] [address], [place], [place] [address]  CLIA #: [id number]  [facility] [address] [place], [place] [address] CLIA #: [id number]        Pertinent labs & imaging results that were available during my care of the patient were reviewed by me and considered in my medical decision making.  Assessment & Plan:  Jolinda was seen today for laceration.  Diagnoses and all orders for this visit:  Laceration of left index finger without foreign body without damage to nail, initial encounter -     Laceration repair -     Td vaccine greater than or equal to 7yo preservative free IM  Tick bite of abdomen, initial encounter -     Td vaccine greater than or equal to 7yo preservative free IM      Assessment and Plan    Laceration of left index finger Sustained a laceration on the left index finger while cutting meat approximately 45 minutes  prior to the visit. Bleeding persists despite pressure with gauze. Requires suturing to promote healing and hemostasis. Informed consent obtained for suturing, including discussion of risks such as infection and benefits of promoting healing and stopping bleeding. - Suture the laceration on the left index finger - Administer local anesthesia prior to suturing - Schedule follow-up appointment for suture removal in 7-10 days  Tetanus prophylaxis Last tetanus vaccination was in 2018. Given the laceration and the fact that it has been over five years since the last tetanus shot, an update is warranted to prevent tetanus infection. Informed consent obtained for tetanus booster, including discussion of benefits in preventing infection. - Administer tetanus booster vaccine          Continue all other maintenance medications.  Follow up plan: Return if symptoms worsen or fail to improve.   Continue healthy lifestyle choices, including diet (rich in fruits, vegetables, and lean proteins, and low in salt and simple carbohydrates) and exercise (at least 30 minutes of moderate physical activity daily).  Educational handout given for wound care  The above assessment and management plan was discussed with the patient. The patient verbalized understanding of and has agreed to the management plan. Patient is aware to call the clinic if they develop any new symptoms or if symptoms persist or worsen. Patient is aware when to return to the clinic for a follow-up visit. Patient educated on when it is appropriate to go to the emergency department.   Kari Baars, FNP-C [facility] [phone]

## 2023-04-12 ENCOUNTER — Encounter: Payer: Self-pay | Admitting: Family Medicine

## 2023-04-12 ENCOUNTER — Ambulatory Visit (INDEPENDENT_AMBULATORY_CARE_PROVIDER_SITE_OTHER): Admitting: Family Medicine

## 2023-04-12 VITALS — HR 77 | Temp 96.8°F | Ht 64.0 in | Wt 175.0 lb

## 2023-04-12 DIAGNOSIS — Z5189 Encounter for other specified aftercare: Secondary | ICD-10-CM

## 2023-04-12 NOTE — Progress Notes (Signed)
 Sutures came out at home per pt, no sutures present. Wound has healed well, no erythema or swelling.

## 2023-05-11 ENCOUNTER — Encounter: Payer: Self-pay | Admitting: Family Medicine

## 2023-05-11 ENCOUNTER — Ambulatory Visit (INDEPENDENT_AMBULATORY_CARE_PROVIDER_SITE_OTHER): Payer: Medicare HMO | Admitting: Family Medicine

## 2023-05-11 ENCOUNTER — Other Ambulatory Visit: Payer: Self-pay | Admitting: Family Medicine

## 2023-05-11 VITALS — BP 116/69 | HR 60 | Temp 98.6°F | Ht 64.0 in | Wt 172.0 lb

## 2023-05-11 DIAGNOSIS — Z0001 Encounter for general adult medical examination with abnormal findings: Secondary | ICD-10-CM

## 2023-05-11 DIAGNOSIS — I10 Essential (primary) hypertension: Secondary | ICD-10-CM | POA: Diagnosis not present

## 2023-05-11 DIAGNOSIS — I2510 Atherosclerotic heart disease of native coronary artery without angina pectoris: Secondary | ICD-10-CM

## 2023-05-11 DIAGNOSIS — Z6829 Body mass index (BMI) 29.0-29.9, adult: Secondary | ICD-10-CM | POA: Diagnosis not present

## 2023-05-11 DIAGNOSIS — E782 Mixed hyperlipidemia: Secondary | ICD-10-CM

## 2023-05-11 DIAGNOSIS — M21611 Bunion of right foot: Secondary | ICD-10-CM

## 2023-05-11 DIAGNOSIS — Z0000 Encounter for general adult medical examination without abnormal findings: Secondary | ICD-10-CM

## 2023-05-11 DIAGNOSIS — E663 Overweight: Secondary | ICD-10-CM

## 2023-05-11 DIAGNOSIS — K648 Other hemorrhoids: Secondary | ICD-10-CM | POA: Diagnosis not present

## 2023-05-11 DIAGNOSIS — E66811 Obesity, class 1: Secondary | ICD-10-CM

## 2023-05-11 MED ORDER — METFORMIN HCL ER 500 MG PO TB24: 500.0000 mg | ORAL_TABLET | Freq: Every day | ORAL | 4 refills | Status: AC

## 2023-05-11 MED ORDER — ROSUVASTATIN CALCIUM 10 MG PO TABS: 10.0000 mg | ORAL_TABLET | Freq: Every day | ORAL | 4 refills | Status: DC

## 2023-05-11 MED ORDER — FAMOTIDINE 40 MG PO TABS: 40.0000 mg | ORAL_TABLET | Freq: Every day | ORAL | 4 refills | Status: AC

## 2023-05-11 MED ORDER — LOSARTAN POTASSIUM 100 MG PO TABS: 100.0000 mg | ORAL_TABLET | Freq: Every day | ORAL | 4 refills | Status: AC

## 2023-05-11 NOTE — Progress Notes (Signed)
 Leah [name] is a 73 y.o. female presents to office today for annual physical exam examination.    Concerns today include: 1.  None.  She reports that she has been doing relatively well.  She will be seeing her [facility] specialist this summer.  She has been caring for a foster child and that will be up in June.  She is planning for a very big family reunion this August and is very excited about that.  Her only concern today is her bunion on the right foot which has been a little bit more painful.  She would like to see the podiatrist she saw back in [place] for discussion of possible surgical intervention  Additionally, she would like to come off of the diuretic portion of her blood pressure medication.  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, Marital status: Single, Substance use: None Health Maintenance Due  Topic Date Due   Zoster Vaccines- Shingrix (1 of 2) Never done   COVID-19 Vaccine (5 - 2024-25 season) 09/17/2022   MAMMOGRAM  03/09/2023   Refills needed today: All  Immunization History  Administered Date(s) Administered   Fluad Quad(high Dose 65+) 12/11/2018, 10/28/2019, 10/29/2020, 11/14/2021   Fluad Trivalent(High Dose 65+) 12/11/2022   Influenza,inj,Quad PF,6+ Mos 10/08/2012, 11/26/2013, 10/22/2014, 10/27/2015, 10/18/2016   Influenza,inj,quad, With Preservative 12/11/2018   Influenza-Unspecified 11/10/2016   Moderna Covid-19 Vaccine Bivalent Booster 46yrs & up 11/09/2020   Moderna Sars-Covid-2 Vaccination 02/21/2019, 03/22/2019, 11/25/2019   PPD Test 12/21/2020   Pneumococcal Conjugate-13 12/18/2018   Pneumococcal Polysaccharide-23 10/17/2011, 04/05/2020   Td 02/16/2016, 04/04/2023   Tdap 02/16/2016   Past Medical History:  Diagnosis Date   Allergy     Allergy  to alpha-gal    Anxiety    Arthritis    Asthma    CAD (coronary artery disease)    Colon polyp    COVID-19 virus infection 05/2020   Depression    Elevated CK    Food allergy     GERD (gastroesophageal reflux  disease)    Hyperlipidemia    Hypertension    Mild tricuspid stenosis    Renal cell carcinoma (HCC)    Urticaria    Social History   Socioeconomic History   Marital status: Divorced    Spouse name: Not on file   Number of children: 3   Years of education: 12+   Highest education level: Some college, no degree  Occupational History   Occupation: private duty Engineer, manufacturing systems  Tobacco Use   Smoking status: Former    Current packs/day: 0.00    Average packs/day: 0.5 packs/day for 28.0 years (14.0 ttl pk-yrs)    Types: Cigarettes    Start date: 01/17/1968    Quit date: 01/17/1996    Years since quitting: 27.3   Smokeless tobacco: Never  Vaping Use   Vaping status: Never Used  Substance and Sexual Activity   Alcohol use: Yes    Alcohol/week: 2.0 standard drinks of alcohol    Types: 2 Glasses of wine per week    Comment: occasional use of wine    Drug use: No   Sexual activity: Not Currently    Partners: Male    Birth control/protection: Surgical  Other Topics Concern   Not on file  Social History Narrative   Lives alone.    Social Drivers of Corporate investment banker Strain: Low Risk  (08/11/2022)   Overall Financial Resource Strain (CARDIA)    Difficulty of Paying Living Expenses: Not hard at all  Food Insecurity: No  Food Insecurity (08/11/2022)   Hunger Vital Sign    Worried About Running Out of Food in the Last Year: Never true    Ran Out of Food in the Last Year: Never true  Transportation Needs: No Transportation Needs (08/11/2022)   PRAPARE - Administrator, Civil Service (Medical): No    Lack of Transportation (Non-Medical): No  Physical Activity: Insufficiently Active (08/11/2022)   Exercise Vital Sign    Days of Exercise per Week: 3 days    Minutes of Exercise per Session: 30 min  Stress: No Stress Concern Present (08/11/2022)   [employer] of Occupational Health - Occupational Stress Questionnaire    Feeling of Stress : Not at all  Social  Connections: Moderately Isolated (08/11/2022)   Social Connection and Isolation Panel [NHANES]    Frequency of Communication with Friends and Family: More than three times a week    Frequency of Social Gatherings with Friends and Family: More than three times a week    Attends Religious Services: More than 4 times per year    Active Member of [employer] or Organizations: No    Attends Banker Meetings: Never    Marital Status: Never married  Intimate Partner Violence: Not At Risk (08/11/2022)   Humiliation, Afraid, Rape, and Kick questionnaire    Fear of Current or Ex-Partner: No    Emotionally Abused: No    Physically Abused: No    Sexually Abused: No   Past Surgical History:  Procedure Laterality Date   ABDOMINAL HYSTERECTOMY     Carpel Tunnel Right    CHOLECYSTECTOMY     COLONOSCOPY  02/08/2005   [id number] hemorrhoids otherwise normal rectum/ A few scattered, shallow, left-sided sigmoid diverticula/The remainder of the colonic mucosa appeared normal   COLONOSCOPY WITH ESOPHAGOGASTRODUODENOSCOPY (EGD) N/A 11/29/2012   Procedure: COLONOSCOPY WITH ESOPHAGOGASTRODUODENOSCOPY (EGD);  Surgeon: Suzette Espy, MD;  Location: [facility];  Service: Endoscopy;  Laterality: N/A;  10:45   HERNIA REPAIR     ventral X 2 with mesh, last one 2011 ([place]). complicated with 2 week hospital stay   HERNIA REPAIR     with mesh   NISSEN FUNDOPLICATION  1992   POLYPECTOMY     ROTATOR CUFF REPAIR Left 04/04/2019   TUBAL LIGATION     Family History  Problem Relation Age of Onset   Heart disease Mother    Alzheimer's disease Mother    Diabetes Mother    Other Father        deceased age 96 from some sort of GI problems but no malignancy   Diabetes Sister    Asthma Sister    Colon polyps Sister    Leukemia Brother        CLL   Acute lymphoblastic leukemia Brother    Diabetes Brother    Diabetes Daughter    Myasthenia gravis Daughter    Pulmonary embolism Daughter    Healthy  Son    Healthy Son    Cancer Cousin        pancreatic   Cancer Cousin        pancreatic   Colon cancer Neg Hx    Stomach cancer Neg Hx    Esophageal cancer Neg Hx    Rectal cancer Neg Hx     Current Outpatient Medications:    albuterol  (PROAIR  HFA) 108 (90 Base) MCG/ACT inhaler, Inhale 2 puffs into the lungs every 4 (four) hours as needed for wheezing or shortness of breath.,  Disp: 8.5 g, Rfl: 3   Albuterol  Sulfate 2.5 MG/0.5ML NEBU, Take 0.5 mLs (2.5 mg total) by nebulization every 6 (six) hours as needed., Disp: 20 mL, Rfl: 3   aspirin  81 MG tablet, Take 81 mg by mouth daily., Disp: , Rfl:    calcium  carbonate (TUMS) 500 MG chewable tablet, Chew 1 tablet (200 mg of elemental calcium  total) by mouth as needed for indigestion or heartburn., Disp: , Rfl:    Cholecalciferol (VITAMIN D ) 2000 UNITS tablet, Take 2,000 Units by mouth daily., Disp: , Rfl:    famotidine  (PEPCID ) 40 MG tablet, Take 1 tablet (40 mg total) by mouth daily., Disp: 90 tablet, Rfl: 3   fish oil-omega-3 fatty acids  1000 MG capsule, Take 2 g by mouth daily. , Disp: , Rfl:    fluticasone  (FLONASE ) 50 MCG/ACT nasal spray, Place 2 sprays into both nostrils daily., Disp: 16 g, Rfl: 6   losartan -hydrochlorothiazide  (HYZAAR) 100-12.5 MG tablet, Take 1 tablet by mouth daily., Disp: 90 tablet, Rfl: 3   metFORMIN  (GLUCOPHAGE -XR) 500 MG 24 hr tablet, TAKE 1 TABLET BY MOUTH DAILY WITH BREAFAST, Disp: 90 tablet, Rfl: 1   Multiple Vitamins-Minerals (EMERGEN-C IMMUNE PO), Take by mouth., Disp: , Rfl:    Polyvinyl Alcohol-Povidone (REFRESH OP), Apply to eye., Disp: , Rfl:    rosuvastatin  (CRESTOR ) 10 MG tablet, Take 1 tablet (10 mg total) by mouth daily., Disp: 90 tablet, Rfl: 3   triamcinolone  cream (KENALOG ) 0.1 %, Apply 1 Application topically 2 (two) times daily., Disp: 30 g, Rfl: 0  Allergies  Allergen Reactions   Beef Allergy  Anaphylaxis   Pork Allergy  Anaphylaxis and Other (See Comments)   Beef (Bovine) Protein Other (See  Comments)    Muscle aches   Codeine Nausea And Vomiting   Levaquin  [Levofloxacin ] Other (See Comments)    Achilles tendon pain   Meat [Alpha-Gal] Other (See Comments)    Muscle Aches   Penicillins Other (See Comments)    Does not know   Praluent [Alirocumab] Dermatitis    Rash and myalgias   Strawberry (Diagnostic) Itching   Strawberry Extract Itching and Other (See Comments)     ROS: Review of Systems Pertinent items noted in HPI and remainder of comprehensive ROS otherwise negative.    Physical exam BP 116/69   Pulse 60   Temp 98.6 F (37 C)   Ht 5\' 4"  (1.626 m)   Wt 172 lb (78 kg)   SpO2 97%   BMI 29.52 kg/m  General appearance: alert, cooperative, appears stated age, and no distress Head: Normocephalic, without obvious abnormality, atraumatic Eyes: conjunctivae/corneas clear. PERRL, EOM's intact.  Ears: normal TM's and external ear canals both ears Nose: Nares normal. Septum midline. Mucosa normal. No drainage or sinus tenderness. Throat: lips, mucosa, and tongue normal; teeth and gums normal Neck: no adenopathy, supple, symmetrical, trachea midline, and thyroid  not enlarged, symmetric, no tenderness/mass/nodules Back: symmetric, no curvature. ROM normal. No CVA tenderness. Lungs: clear to auscultation bilaterally Heart: regular rate and rhythm, S1, S2 normal, no murmur, click, rub or gallop Abdomen: soft, non-tender; bowel sounds normal; no masses,  no organomegaly and postsurgical scar appreciated midline Extremities: extremities normal, atraumatic, no cyanosis or edema and she has hallucis valgus deformity on the right and left foot right is much more pronounced Pulses: 2+ and symmetric Skin: Skin color, texture, turgor normal. No rashes or lesions Lymph nodes: Cervical, supraclavicular, and axillary nodes normal. Neurologic: Grossly normal      05/11/2023   10:24 AM 03/12/2023  11:52 AM 11/07/2022    2:42 PM  Depression screen PHQ 2/9  Decreased Interest 0 0  0  Down, Depressed, Hopeless 0 0 0  PHQ - 2 Score 0 0 0  Altered sleeping 0  0  Tired, decreased energy 0  0  Change in appetite 0  0  Feeling bad or failure about yourself  0  0  Trouble concentrating 0  0  Moving slowly or fidgety/restless 0  0  Suicidal thoughts 0  0  PHQ-9 Score 0  0  Difficult doing work/chores Not difficult at all  Not difficult at all      05/11/2023   10:24 AM 11/07/2022    2:43 PM 04/26/2022   10:34 AM 08/30/2021    2:19 PM  GAD 7 : Generalized Anxiety Score  Nervous, Anxious, on Edge 0 0 0 0  Control/stop worrying 0 0 0 0  Worry too much - different things 0 0 0 0  Trouble relaxing 0 0 0 0  Restless 0 0 0 0  Easily annoyed or irritable 0 0 0 0  Afraid - awful might happen 0 0 0 0  Total GAD 7 Score 0 0 0 0  Anxiety Difficulty Not difficult at all Not difficult at all Not difficult at all Not difficult at all     Assessment/ Plan: Lorin Room here for annual physical exam.   Annual physical exam  Bunion, right - Plan: Ambulatory referral to Podiatry  Coronary artery disease involving native coronary artery of native heart without angina pectoris - Plan: Lipid Panel, CMP14+EGFR  Hypertension, benign essential, goal below 140/90 - Plan: CMP14+EGFR, losartan  (COZAAR ) 100 MG tablet  Mixed hyperlipidemia - Plan: TSH, Lipid Panel, rosuvastatin  (CRESTOR ) 10 MG tablet, CMP14+EGFR  Obesity (BMI 30.0-34.9) - Plan: VITAMIN D  25 Hydroxy (Vit-D Deficiency, Fractures), Bayer DCA Hb A1c Waived, metFORMIN  (GLUCOPHAGE -XR) 500 MG 24 hr tablet  Internal hemorrhoids - Plan: CBC with Differential, famotidine  (PEPCID ) 40 MG tablet  Fasting labs collected.  Referral to podiatry back in [place] placed for treatment of bunion on the right  Her blood pressure is well-controlled and I will go ahead and discontinue the hydrochlorothiazide  portion.  Advised her to monitor blood pressures with goal less than 140/90  Continue statin given history of  CAD  Continue metformin .  Her weight is out of the obesity range, following to 29 now  Check CBC.  Pepcid  renewed.  Counseled on healthy lifestyle choices, including diet (rich in fruits, vegetables and lean meats and low in salt and simple carbohydrates) and exercise (at least 30 minutes of moderate physical activity daily).  Patient to follow up 62m for BP  Johari Pinney M. Bonnell Butcher, DO

## 2023-05-12 ENCOUNTER — Other Ambulatory Visit: Payer: Self-pay | Admitting: Family Medicine

## 2023-05-12 DIAGNOSIS — K648 Other hemorrhoids: Secondary | ICD-10-CM

## 2023-05-12 LAB — CBC WITH DIFFERENTIAL/PLATELET
Basophils Absolute: 0 10*3/uL (ref 0.0–0.2)
Basos: 1 %
EOS (ABSOLUTE): 0.1 10*3/uL (ref 0.0–0.4)
Eos: 3 %
Hematocrit: 37.2 % (ref 34.0–46.6)
Hemoglobin: 12.1 g/dL (ref 11.1–15.9)
Immature Grans (Abs): 0 10*3/uL (ref 0.0–0.1)
Immature Granulocytes: 0 %
Lymphocytes Absolute: 1.1 10*3/uL (ref 0.7–3.1)
Lymphs: 38 %
MCH: 27.2 pg (ref 26.6–33.0)
MCHC: 32.5 g/dL (ref 31.5–35.7)
MCV: 84 fL (ref 79–97)
Monocytes Absolute: 0.2 10*3/uL (ref 0.1–0.9)
Monocytes: 8 %
Neutrophils Absolute: 1.4 10*3/uL (ref 1.4–7.0)
Neutrophils: 50 %
Platelets: 254 10*3/uL (ref 150–450)
RBC: 4.45 x10E6/uL (ref 3.77–5.28)
RDW: 12.8 % (ref 11.7–15.4)
WBC: 2.8 10*3/uL — ABNORMAL LOW (ref 3.4–10.8)

## 2023-05-12 LAB — CMP14+EGFR
ALT: 19 IU/L (ref 0–32)
AST: 20 IU/L (ref 0–40)
Albumin: 4.2 g/dL (ref 3.8–4.8)
Alkaline Phosphatase: 83 IU/L (ref 44–121)
BUN/Creatinine Ratio: 20 (ref 12–28)
BUN: 17 mg/dL (ref 8–27)
Bilirubin Total: 0.3 mg/dL (ref 0.0–1.2)
CO2: 24 mmol/L (ref 20–29)
Calcium: 9.5 mg/dL (ref 8.7–10.3)
Chloride: 103 mmol/L (ref 96–106)
Creatinine, Ser: 0.83 mg/dL (ref 0.57–1.00)
Globulin, Total: 2.1 g/dL (ref 1.5–4.5)
Glucose: 97 mg/dL (ref 70–99)
Potassium: 3.9 mmol/L (ref 3.5–5.2)
Sodium: 143 mmol/L (ref 134–144)
Total Protein: 6.3 g/dL (ref 6.0–8.5)
eGFR: 75 mL/min/{1.73_m2} (ref >59)

## 2023-05-12 LAB — LIPID PANEL
Chol/HDL Ratio: 2.5 ratio (ref 0.0–4.4)
Cholesterol, Total: 171 mg/dL (ref 100–199)
HDL: 68 mg/dL (ref >39)
LDL Chol Calc (NIH): 90 mg/dL (ref 0–99)
Triglycerides: 66 mg/dL (ref 0–149)
VLDL Cholesterol Cal: 13 mg/dL (ref 5–40)

## 2023-05-12 LAB — TSH: TSH: 1.03 u[IU]/mL (ref 0.450–4.500)

## 2023-05-12 LAB — VITAMIN D 25 HYDROXY (VIT D DEFICIENCY, FRACTURES): Vit D, 25-Hydroxy: 48.4 ng/mL (ref 30.0–100.0)

## 2023-05-14 ENCOUNTER — Encounter: Payer: Self-pay | Admitting: Family Medicine

## 2023-05-16 ENCOUNTER — Other Ambulatory Visit: Payer: Self-pay | Admitting: Family Medicine

## 2023-05-16 DIAGNOSIS — D72819 Decreased white blood cell count, unspecified: Secondary | ICD-10-CM

## 2023-06-04 ENCOUNTER — Ambulatory Visit: Admitting: Podiatry

## 2023-06-04 ENCOUNTER — Ambulatory Visit (INDEPENDENT_AMBULATORY_CARE_PROVIDER_SITE_OTHER)

## 2023-06-04 DIAGNOSIS — M21619 Bunion of unspecified foot: Secondary | ICD-10-CM | POA: Diagnosis not present

## 2023-06-04 DIAGNOSIS — M21611 Bunion of right foot: Secondary | ICD-10-CM

## 2023-06-04 DIAGNOSIS — M2041 Other hammer toe(s) (acquired), right foot: Secondary | ICD-10-CM | POA: Diagnosis not present

## 2023-06-04 NOTE — Patient Instructions (Signed)
 Pre-Operative Instructions  Congratulations, you have decided to take an important step to improving your quality of life.  You can be assured that the doctors of [facility] will be with you every step of the way.  Plan to be at the surgery center/hospital at least 1 (one) hour prior to your scheduled time unless otherwise directed by the surgical center/hospital staff.  You must have a responsible adult accompany you, remain during the surgery and drive you home.  Make sure you have directions to the surgical center/hospital and know how to get there on time. For hospital based surgery you will need to obtain a history and physical form from your family physician within 1 month prior to the date of surgery- we will give you a form for you primary physician.  We make every effort to accommodate the date you request for surgery.  There are however, times where surgery dates or times have to be moved.  We will contact you as soon as possible if a change in schedule is required.   No Aspirin /Ibuprofen for one week before surgery.  If you are on aspirin , any non-steroidal anti-inflammatory medications (Mobic , Aleve, Ibuprofen) you should stop taking it 7 days prior to your surgery.  You make take Tylenol   For pain prior to surgery.  Medications- If you are taking daily heart and blood pressure medications, seizure, reflux, allergy, asthma, anxiety, pain or diabetes medications, make sure the surgery center/hospital is aware before the day of surgery so they may notify you which medications to take or avoid the day of surgery. No food or drink after midnight the night before surgery unless directed otherwise by surgical center/hospital staff. No alcoholic beverages 24 hours prior to surgery.  No smoking 24 hours prior to or 24 hours after surgery. Wear loose pants or shorts- loose enough to fit over bandages, boots, and casts. No slip on shoes, sneakers are best. Bring your boot with you to the  surgery center/hospital.  Also bring crutches or a walker if your physician has prescribed it for you.  If you do not have this equipment, it will be provided for you after surgery. If you have not been contracted by the surgery center/hospital by the day before your surgery, call to confirm the date and time of your surgery. Leave-time from work may vary depending on the type of surgery you have.  Appropriate arrangements should be made prior to surgery with your employer. Prescriptions will be provided immediately following surgery by your doctor.  Have these filled as soon as possible after surgery and take the medication as directed. Remove nail polish on the operative foot. Wash the night before surgery.  The night before surgery wash the foot and leg well with the antibacterial soap provided and water paying special attention to beneath the toenails and in between the toes.  Rinse thoroughly with water and dry well with a towel.  Perform this wash unless told not to do so by your physician.  Enclosed: 1 Ice pack (please put in freezer the night before surgery)   1 Hibiclens skin cleaner   Pre-op Instructions  If you have any questions regarding the instructions, do not hesitate to call our office at any point during this process.   [place]: [address] [place], [place] [address] [phone]  Dr. Bobbie Burows, DPM

## 2023-06-04 NOTE — Progress Notes (Signed)
 Subjective:  Patient ID: Leah [name], female    DOB: [birth year],  MRN: [id number]  Chief Complaint  Patient presents with   Bunions    Pt is here for rt. foot bunion.    Discussed the use of AI scribe software for clinical note transcription with the patient, who gave verbal consent to proceed.  History of Present Illness Leah [name] is a 73 year old female who presents with foot pain due to bunions and hammer toe.  She experiences significant pain from bunions, especially on the right foot, which worsens with shoe wear, making it difficult to find comfortable footwear. The bunion pain is more severe than the discomfort from the hammer toe.  The hammer toe on the right foot causes pain when wearing shoes, with soreness localized under the second toe. There is no numbness or tingling in the foot.  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 She plans to attend a family reunion in early August.  Her family history includes a daughter who died from blood clots following surgery, raising concerns about her own risk for blood clots, especially given her history of kidney cancer.      Objective:    Physical Exam General: AAO x3, NAD  Dermatological: Skin is warm, dry and supple bilateral. There are no open sores, no preulcerative lesions, no rash or signs of infection present.  Vascular: Dorsalis Pedis artery and Posterior Tibial artery pedal pulses are 2/4 bilateral with immedate capillary fill time.  There is no pain with calf compression, swelling, warmth, erythema.   Neruologic: Grossly intact via light touch bilateral.   Musculoskeletal: Moderate bunions present in the right foot.  There is no crepitation or restriction of first MTPJ range of motion.  Semirigid hammertoe contracture present of the second digit with tenderness palpation of the PIPJ.  She has some discomfort on the second PJ.   No area pinpoint tenderness.  She is also starting a hammertoe of the left second toe as well.  Gait: Unassisted, Nonantalgic.     No images are attached to the encounter.    Results LABS PLT: 254 (05/11/2023)  RADIOLOGY X-rays obtained reviewed bilaterally.  There is no evidence of acute fracture.  Intermetatarsal angle of about 13 degrees.   Assessment:   1. Bunion   2. Hammer toe of right foot      Plan:  Patient was evaluated and treated and all questions answered.  Assessment and Plan Assessment & Plan Bunion and hammer toe on right foot Chronic bunion and hammer toe causing pain. X-rays stable compared to prior.   - Discussed with conservative as well as surgical treatment options.  After discussion regards her options wants to proceed with surgery at this time as she is attempted conservative treatment any significant improvement. - Schedule surgery for bunion and hammer toe correction after August 1st, 2025. - Perform [place] and possible [place] with second digit hammertoe repair. - The incision placement as well as the postoperative course was discussed with the patient. I discussed risks of the surgery which include, but not limited to, infection, bleeding, pain, swelling, need for further surgery, delayed or nonhealing, painful or ugly scar, numbness or sensation changes, over/under correction, recurrence, transfer lesions, further deformity, hardware failure, DVT/PE, loss of toe/foot. Patient understands these risks and wishes to proceed with surgery. The surgical consent was reviewed with the patient all 3 pages were signed. No promises  or guarantees were given to the outcome of the procedure. All questions were answered to the best of my ability. Before the surgery the patient was encouraged to call the office if there is any further questions. The surgery will be performed at the [facility] on an outpatient basis. - Prescribe anticoagulants post-surgery due to increased risk  of blood clots.    No follow-ups on file.   Charity Conch DPM

## 2023-06-07 ENCOUNTER — Encounter: Payer: Self-pay | Admitting: Podiatry

## 2023-07-02 ENCOUNTER — Other Ambulatory Visit: Payer: Self-pay | Admitting: Family Medicine

## 2023-07-02 DIAGNOSIS — Z1231 Encounter for screening mammogram for malignant neoplasm of breast: Secondary | ICD-10-CM

## 2023-07-13 ENCOUNTER — Ambulatory Visit

## 2023-07-17 DIAGNOSIS — N2889 Other specified disorders of kidney and ureter: Secondary | ICD-10-CM | POA: Diagnosis not present

## 2023-07-17 DIAGNOSIS — C642 Malignant neoplasm of left kidney, except renal pelvis: Secondary | ICD-10-CM | POA: Diagnosis not present

## 2023-07-24 DIAGNOSIS — N2889 Other specified disorders of kidney and ureter: Secondary | ICD-10-CM | POA: Diagnosis not present

## 2023-07-26 ENCOUNTER — Telehealth: Payer: Self-pay | Admitting: Podiatry

## 2023-07-26 NOTE — Telephone Encounter (Signed)
 Received surgical clearance from pcp.

## 2023-07-31 DIAGNOSIS — H524 Presbyopia: Secondary | ICD-10-CM | POA: Diagnosis not present

## 2023-07-31 DIAGNOSIS — H2513 Age-related nuclear cataract, bilateral: Secondary | ICD-10-CM | POA: Diagnosis not present

## 2023-08-02 ENCOUNTER — Ambulatory Visit
Admission: RE | Admit: 2023-08-02 | Discharge: 2023-08-02 | Disposition: A | Discharge status: Home / Self Care | Source: Ambulatory Visit | Attending: Family Medicine | Admitting: Family Medicine

## 2023-08-02 DIAGNOSIS — Z1231 Encounter for screening mammogram for malignant neoplasm of breast: Secondary | ICD-10-CM

## 2023-08-14 ENCOUNTER — Ambulatory Visit: Payer: Medicare HMO

## 2023-08-14 VITALS — BP 116/69 | HR 60 | Ht 64.0 in | Wt 172.0 lb

## 2023-08-14 DIAGNOSIS — Z0000 Encounter for general adult medical examination without abnormal findings: Secondary | ICD-10-CM

## 2023-08-14 NOTE — Patient Instructions (Signed)
 Ms. Finder , Thank you for taking time out of your busy schedule to complete your Annual Wellness Visit with me. I enjoyed our conversation and look forward to speaking with you again next year. I, as well as your care team,  appreciate your ongoing commitment to your health goals. Please review the following plan we discussed and let me know if I can assist you in the future. Your Game plan/ To Do List    Follow up Visits: Next Medicare AWV with our clinical staff: 08/14/24 at 10:00a.m.   Next Office Visit with your provider: 11/13/23 at 9:30a.m.  Clinician Recommendations:  Aim for 30 minutes of exercise or brisk walking, 6-8 glasses of water , and 5 servings of fruits and vegetables each day.       This is a list of the screening recommended for you and due dates:  Health Maintenance  Topic Date Due   Zoster (Shingles) Vaccine (1 of 2) Never done   COVID-19 Vaccine (5 - 2024-25 season) 09/17/2022   Medicare Annual Wellness Visit  08/11/2023   Flu Shot  08/17/2023   Stool Blood Test  01/26/2024   Mammogram  08/01/2024   DEXA scan (bone density measurement)  05/10/2027   Colon Cancer Screening  01/25/2030   DTaP/Tdap/Td vaccine (4 - Td or Tdap) 04/03/2033   Pneumococcal Vaccine for age over 2  Completed   Hepatitis C Screening  Completed   Hepatitis B Vaccine  Aged Out   HPV Vaccine  Aged Out   Meningitis B Vaccine  Aged Out    Advanced directives: (Declined) Advance directive discussed with you today. Even though you declined this today, please call our office should you change your mind, and we can give you the proper paperwork for you to fill out. Advance Care Planning is important because it:  [x]  Makes sure you receive the medical care that is consistent with your values, goals, and preferences  [x]  It provides guidance to your family and loved ones and reduces their decisional burden about whether or not they are making the right decisions based on your wishes.  Follow the  link provided in your after visit summary or read over the paperwork we have mailed to you to help you started getting your Advance Directives in place. If you need assistance in completing these, please reach out to us  so that we can help you!  See attachments for Preventive Care and Fall Prevention Tips.

## 2023-08-14 NOTE — Progress Notes (Signed)
 Subjective:   Leah [name] is a 73 y.o. who presents for a Medicare Wellness preventive visit.  As a reminder, Annual Wellness Visits don't include a physical exam, and some assessments may be limited, especially if this visit is performed virtually. We may recommend an in-person follow-up visit with your provider if needed.  Visit Complete: Virtual I connected with  Leah [name] on 08/14/23 by a audio enabled telemedicine application and verified that I am speaking with the correct person using two identifiers.  Patient Location: Home  Provider Location: Home Office  I discussed the limitations of evaluation and management by telemedicine. The patient expressed understanding and agreed to proceed.  Vital Signs: Because this visit was a virtual/telehealth visit, some criteria may be missing or patient reported. Any vitals not documented were not able to be obtained and vitals that have been documented are patient reported.  VideoDeclined- This patient declined Librarian, academic. Therefore the visit was completed with audio only.  Persons Participating in Visit: Patient.  AWV Questionnaire: No: Patient Medicare AWV questionnaire was not completed prior to this visit.  Cardiac Risk Factors include: advanced age (>80men, >60 women);dyslipidemia;hypertension;smoking/ tobacco exposure     Objective:    Today's Vitals   08/14/23 0952  BP: 116/69  Pulse: 60  Weight: 172 lb (78 kg)  Height: 5' 4 (1.626 m)   Body mass index is 29.52 kg/m.     08/14/2023   10:04 AM 11/09/2022   12:29 PM 08/11/2022    2:54 PM 05/18/2022    1:02 PM 07/27/2021    3:39 PM 07/26/2020    4:11 PM 03/26/2019    9:42 AM  Advanced Directives  Does Patient Have a Medical Advance Directive? No No No No No No No  Would patient like information on creating a medical advance directive?   Yes (MAU/Ambulatory/Procedural Areas - Information given)  No - Patient declined No -  Patient declined No - Patient declined    Current Medications (verified) Outpatient Encounter Medications as of 08/14/2023  Medication Sig   albuterol  (PROAIR  HFA) 108 (90 Base) MCG/ACT inhaler Inhale 2 puffs into the lungs every 4 (four) hours as needed for wheezing or shortness of breath.   Albuterol  Sulfate 2.5 MG/0.5ML NEBU Take 0.5 mLs (2.5 mg total) by nebulization every 6 (six) hours as needed.   aspirin  81 MG tablet Take 81 mg by mouth daily.   calcium  carbonate (TUMS) 500 MG chewable tablet Chew 1 tablet (200 mg of elemental calcium  total) by mouth as needed for indigestion or heartburn.   Cholecalciferol (VITAMIN D ) 2000 UNITS tablet Take 2,000 Units by mouth daily.   famotidine  (PEPCID ) 40 MG tablet Take 1 tablet (40 mg total) by mouth daily.   fish oil-omega-3 fatty acids  1000 MG capsule Take 2 g by mouth daily.    fluticasone  (FLONASE ) 50 MCG/ACT nasal spray Place 2 sprays into both nostrils daily.   losartan  (COZAAR ) 100 MG tablet Take 1 tablet (100 mg total) by mouth daily. To replace Losartan /HCTZ   metFORMIN  (GLUCOPHAGE -XR) 500 MG 24 hr tablet Take 1 tablet (500 mg total) by mouth daily with breakfast.   Multiple Vitamins-Minerals (EMERGEN-C IMMUNE PO) Take by mouth.   Polyvinyl Alcohol-Povidone (REFRESH OP) Apply to eye.   rosuvastatin  (CRESTOR ) 10 MG tablet TAKE 1 TABLET BY MOUTH DAILY   triamcinolone  cream (KENALOG ) 0.1 % Apply 1 Application topically 2 (two) times daily.   No facility-administered encounter medications on file as of 08/14/2023.  Allergies (verified) Beef allergy , Pork allergy , Beef (bovine) protein, Codeine, Levaquin  [levofloxacin ], Meat [alpha-gal], Penicillins, Praluent [alirocumab], Strawberry (diagnostic), and Strawberry extract   History: Past Medical History:  Diagnosis Date   Allergy     Allergy  to alpha-gal    Anxiety    Arthritis    Asthma    CAD (coronary artery disease)    Colon polyp    COVID-19 virus infection 05/2020    Depression    Elevated CK    Food allergy     GERD (gastroesophageal reflux disease)    Hyperlipidemia    Hypertension    Mild tricuspid stenosis    Renal cell carcinoma (HCC)    Urticaria    Past Surgical History:  Procedure Laterality Date   ABDOMINAL HYSTERECTOMY     Carpel Tunnel Right    CHOLECYSTECTOMY     COLONOSCOPY  02/08/2005   MFM:Pwuzmwjo hemorrhoids otherwise normal rectum/ A few scattered, shallow, left-sided sigmoid diverticula/The remainder of the colonic mucosa appeared normal   COLONOSCOPY WITH ESOPHAGOGASTRODUODENOSCOPY (EGD) N/A 11/29/2012   Procedure: COLONOSCOPY WITH ESOPHAGOGASTRODUODENOSCOPY (EGD);  Surgeon: Lamar CHRISTELLA Hollingshead, MD;  Location: [facility];  Service: Endoscopy;  Laterality: N/A;  10:45   HERNIA REPAIR     ventral X 2 with mesh, last one 2011 ([place]). complicated with 2 week hospital stay   HERNIA REPAIR     with mesh   NISSEN FUNDOPLICATION  1992   POLYPECTOMY     ROTATOR CUFF REPAIR Left 04/04/2019   TUBAL LIGATION     Family History  Problem Relation Age of Onset   Heart disease Mother    Alzheimer's disease Mother    Diabetes Mother    Other Father        deceased age 73 from some sort of GI problems but no malignancy   Diabetes Sister    Asthma Sister    Colon polyps Sister    Leukemia Brother        CLL   Acute lymphoblastic leukemia Brother    Diabetes Brother    Diabetes Daughter    Myasthenia gravis Daughter    Pulmonary embolism Daughter    Healthy Son    Healthy Son    Cancer Cousin        pancreatic   Cancer Cousin        pancreatic   Colon cancer Neg Hx    Stomach cancer Neg Hx    Esophageal cancer Neg Hx    Rectal cancer Neg Hx    Social History   Socioeconomic History   Marital status: Divorced    Spouse name: Not on file   Number of children: 3   Years of education: 12+   Highest education level: Some college, no degree  Occupational History   Occupation: private duty Engineer, manufacturing systems  Tobacco Use    Smoking status: Former    Current packs/day: 0.00    Average packs/day: 0.5 packs/day for 28.0 years (14.0 ttl pk-yrs)    Types: Cigarettes    Start date: 01/17/1968    Quit date: 01/17/1996    Years since quitting: 27.5   Smokeless tobacco: Never  Vaping Use   Vaping status: Never Used  Substance and Sexual Activity   Alcohol use: Yes    Alcohol/week: 2.0 standard drinks of alcohol    Types: 2 Glasses of wine per week    Comment: occasional use of wine    Drug use: No   Sexual activity: Not Currently  Partners: Male    Birth control/protection: Surgical  Other Topics Concern   Not on file  Social History Narrative   Lives alone.    Social Drivers of Corporate investment banker Strain: Low Risk  (08/14/2023)   Overall Financial Resource Strain (CARDIA)    Difficulty of Paying Living Expenses: Not hard at all  Food Insecurity: No Food Insecurity (08/14/2023)   Hunger Vital Sign    Worried About Running Out of Food in the Last Year: Never true    Ran Out of Food in the Last Year: Never true  Transportation Needs: No Transportation Needs (08/14/2023)   PRAPARE - Administrator, Civil Service (Medical): No    Lack of Transportation (Non-Medical): No  Physical Activity: Insufficiently Active (08/11/2022)   Exercise Vital Sign    Days of Exercise per Week: 3 days    Minutes of Exercise per Session: 30 min  Stress: No Stress Concern Present (08/14/2023)   [employer] of Occupational Health - Occupational Stress Questionnaire    Feeling of Stress: Not at all  Social Connections: Moderately Integrated (08/14/2023)   Social Connection and Isolation Panel    Frequency of Communication with Friends and Family: More than three times a week    Frequency of Social Gatherings with Friends and Family: More than three times a week    Attends Religious Services: More than 4 times per year    Active Member of [employer] or Organizations: Yes    Attends Hospital doctor: More than 4 times per year    Marital Status: Divorced    Tobacco Counseling Counseling given: Yes    Clinical Intake:  Pre-visit preparation completed: Yes  Pain : No/denies pain     BMI - recorded: 29.52 Nutritional Status: BMI 25 -29 Overweight Nutritional Risks: None Diabetes: No  Lab Results  Component Value Date   HGBA1C 6.1 (H) 04/26/2022   HGBA1C 5.8 (H) 05/04/2021   HGBA1C 5.9 03/31/2019     How often do you need to have someone help you when you read instructions, pamphlets, or other written materials from your doctor or pharmacy?: 1 - Never  Interpreter Needed?: No  Information entered by :: alia t/cma   Activities of Daily Living     08/14/2023   10:04 AM  In your present state of health, do you have any difficulty performing the following activities:  Hearing? 1  Vision? 0  Difficulty concentrating or making decisions? 0  Walking or climbing stairs? 0  Dressing or bathing? 0  Doing errands, shopping? 0  Preparing Food and eating ? N  Using the Toilet? N  In the past six months, have you accidently leaked urine? N  Do you have problems with loss of bowel control? N  Managing your Medications? N  Managing your Finances? N  Housekeeping or managing your Housekeeping? N    Patient Care Team: Jolinda Norene HERO, DO as PCP - General (Family Medicine) Verlin Lonni BIRCH, MD as PCP - Cardiology (Cardiology) Teressa Toribio SQUIBB, MD (Inactive) as Attending Physician (Gastroenterology) Sherrilee Belvie CROME, MD as Consulting Physician (Urology) Kay Kemps, MD as Consulting Physician (Orthopedic Surgery) Waylan Cain, MD as Consulting Physician (Ophthalmology)  I have updated your Care Teams any recent Medical Services you may have received from other providers in the past year.     Assessment:   This is a routine wellness examination for Markesha.  Hearing/Vision screen Hearing Screening - Comments:: Pt stated some hearing  dif on  the L-ear/suggest talking to pcp at next visit  Vision Screening - Comments:: Pt last ov/7/25   Goals Addressed             This Visit's Progress    Patient Stated       Pt would like to see a LIVE tennis game to see [facility]       Depression Screen     08/14/2023   10:15 AM 05/11/2023   10:24 AM 03/12/2023   11:52 AM 11/07/2022    2:42 PM 08/11/2022    2:53 PM 04/26/2022   10:34 AM 08/30/2021    2:19 PM  PHQ 2/9 Scores  PHQ - 2 Score 0 0 0 0 0 0 0  PHQ- 9 Score  0  0  0 0    Fall Risk     08/14/2023    9:55 AM 05/11/2023   10:26 AM 03/12/2023   11:52 AM 11/07/2022    2:41 PM 08/11/2022    2:49 PM  Fall Risk   Falls in the past year? 0 0 0 0 0  Number falls in past yr: 0 0  0 0  Injury with Fall? 0 0  0 0  Risk for fall due to : No Fall Risks No Fall Risks  No Fall Risks No Fall Risks  Follow up Falls evaluation completed Falls evaluation completed  Education provided Falls prevention discussed    MEDICARE RISK AT HOME:  Medicare Risk at Home Any stairs in or around the home?: No If so, are there any without handrails?: No Home free of loose throw rugs in walkways, pet beds, electrical cords, etc?: Yes Adequate lighting in your home to reduce risk of falls?: Yes Life alert?: No Use of a cane, walker or w/c?: No Grab bars in the bathroom?: Yes Shower chair or bench in shower?: No Elevated toilet seat or a handicapped toilet?: Yes  TIMED UP AND GO:  Was the test performed?  no  Cognitive Function: 6CIT completed    08/08/2017    4:34 PM  MMSE - Mini Mental State Exam  Orientation to time 5  Orientation to Place 5  Registration 3  Attention/ Calculation 5  Recall 3  Language- name 2 objects 2  Language- repeat 1  Language- follow 3 step command 3  Language- read & follow direction 1  Write a sentence 1  Copy design 1  Total score 30        08/14/2023   10:15 AM 08/14/2023   10:04 AM 08/11/2022    2:55 PM 07/27/2021    3:42 PM 03/26/2019    9:47 AM   6CIT Screen  What Year? 0 points 0 points 0 points 0 points 0 points  What month? 0 points 0 points 0 points 0 points 0 points  What time? 0 points 0 points 0 points 0 points 0 points  Count back from 20 0 points 0 points 0 points 0 points 0 points  Months in reverse 0 points 0 points 0 points 0 points 0 points  Repeat phrase 0 points 0 points 0 points 0 points 0 points  Total Score 0 points 0 points 0 points 0 points 0 points    Immunizations Immunization History  Administered Date(s) Administered   Fluad Quad(high Dose 65+) 12/11/2018, 10/28/2019, 10/29/2020, 11/14/2021   Fluad Trivalent(High Dose 65+) 12/11/2022   Influenza,inj,Quad PF,6+ Mos 10/08/2012, 11/26/2013, 10/22/2014, 10/27/2015, 10/18/2016   Influenza,inj,quad, With Preservative 12/11/2018   Influenza-Unspecified 11/10/2016  Moderna Covid-19 Vaccine Bivalent Booster 46yrs & up 11/09/2020   Moderna Sars-Covid-2 Vaccination 02/21/2019, 03/22/2019, 11/25/2019   PPD Test 12/21/2020   Pneumococcal Conjugate-13 12/18/2018   Pneumococcal Polysaccharide-23 10/17/2011, 04/05/2020   Td 02/16/2016, 04/04/2023   Tdap 02/16/2016    Screening Tests Health Maintenance  Topic Date Due   Zoster Vaccines- Shingrix (1 of 2) Never done   COVID-19 Vaccine (5 - 2024-25 season) 09/17/2022   INFLUENZA VACCINE  08/17/2023   COLON CANCER SCREENING ANNUAL FOBT  01/26/2024   MAMMOGRAM  08/01/2024   Medicare Annual Wellness (AWV)  08/13/2024   DEXA SCAN  05/10/2027   Colonoscopy  01/25/2030   DTaP/Tdap/Td (4 - Td or Tdap) 04/03/2033   Pneumococcal Vaccine: 50+ Years  Completed   Hepatitis C Screening  Completed   Hepatitis B Vaccines  Aged Out   HPV VACCINES  Aged Out   Meningococcal B Vaccine  Aged Out    Health Maintenance  Health Maintenance Due  Topic Date Due   Zoster Vaccines- Shingrix (1 of 2) Never done   COVID-19 Vaccine (5 - 2024-25 season) 09/17/2022   Health Maintenance Items Addressed: See Nurse Notes at the end  of this note  Additional Screening:  Vision Screening: Recommended annual ophthalmology exams for early detection of glaucoma and other disorders of the eye. Would you like a referral to an eye doctor? No    Dental Screening: Recommended annual dental exams for proper oral hygiene  Community Resource Referral / Chronic Care Management: CRR required this visit?  No   CCM required this visit?  No   Plan:    I have personally reviewed and noted the following in the patient's chart:   Medical and social history Use of alcohol, tobacco or illicit drugs  Current medications and supplements including opioid prescriptions. Patient is not currently taking opioid prescriptions. Functional ability and status Nutritional status Physical activity Advanced directives List of other physicians Hospitalizations, surgeries, and ER visits in previous 12 months Vitals Screenings to include cognitive, depression, and falls Referrals and appointments  In addition, I have reviewed and discussed with patient certain preventive protocols, quality metrics, and best practice recommendations. A written personalized care plan for preventive services as well as general preventive health recommendations were provided to patient.   Ozie Ned, CMA   08/14/2023   After Visit Summary: (MyChart) Due to this being a telephonic visit, the after visit summary with patients personalized plan was offered to patient via MyChart   Notes: Nothing significant to report at this time.

## 2023-10-10 ENCOUNTER — Telehealth: Payer: Self-pay | Admitting: Podiatry

## 2023-10-10 NOTE — Telephone Encounter (Signed)
 DOS- 10/31/2023  DOUBLE OSTEOTOMY RT- 28299 2ND HAMMERTOE REPAIR RT- 71714  [employer] EFFECTIVE DATE- 01/16/2022  DEDUCTIBLE- $0 REMAINING- $0 OOP- $4150 REMAINING- $3904.75 COINSURANCE- 0%  PER [facility] WEBSITE, NO PRIOR AUTHS ARE REQUIRED FOR CPT CODES 71700 AND [phone]. DOCUMENTATION ATTACHED TO SURGICAL CONSENT PACKET.

## 2023-10-11 NOTE — Progress Notes (Addendum)
 Leah [name]                                          MRN: [id number]   10/11/2023   The [facility] Quality Team Specialist reviewed this patient medical record for the purposes of chart review for care gap closure. The following were reviewed: chart review for care gap closure-glycemic status assessment.rechecked GSD measure for 2025 updates    [facility] Quality Team

## 2023-10-15 ENCOUNTER — Telehealth: Payer: Self-pay | Admitting: Family Medicine

## 2023-10-15 DIAGNOSIS — Z0279 Encounter for issue of other medical certificate: Secondary | ICD-10-CM

## 2023-10-15 NOTE — Telephone Encounter (Signed)
 PT dropped off CPE forms to be completed and signed.  Form Fee Paid? (YES)            If NO, form is placed on front office manager desk to hold until payment received. If YES, then form will be placed in the RX/HH Nurse Coordinators box for completion.  Form will not be processed until payment is received

## 2023-10-19 DIAGNOSIS — H5203 Hypermetropia, bilateral: Secondary | ICD-10-CM | POA: Diagnosis not present

## 2023-10-19 DIAGNOSIS — H524 Presbyopia: Secondary | ICD-10-CM | POA: Diagnosis not present

## 2023-10-19 DIAGNOSIS — H52209 Unspecified astigmatism, unspecified eye: Secondary | ICD-10-CM | POA: Diagnosis not present

## 2023-10-23 NOTE — Telephone Encounter (Signed)
Aware form ready

## 2023-10-31 ENCOUNTER — Other Ambulatory Visit: Payer: Self-pay | Admitting: Podiatry

## 2023-10-31 DIAGNOSIS — M2041 Other hammer toe(s) (acquired), right foot: Secondary | ICD-10-CM | POA: Diagnosis not present

## 2023-10-31 DIAGNOSIS — M2011 Hallux valgus (acquired), right foot: Secondary | ICD-10-CM | POA: Diagnosis not present

## 2023-10-31 DIAGNOSIS — G8918 Other acute postprocedural pain: Secondary | ICD-10-CM | POA: Diagnosis not present

## 2023-10-31 DIAGNOSIS — M21611 Bunion of right foot: Secondary | ICD-10-CM | POA: Diagnosis not present

## 2023-10-31 MED ORDER — ONDANSETRON HCL 4 MG PO TABS: 4.0000 mg | ORAL_TABLET | Freq: Three times a day (TID) | ORAL | 0 refills | Status: DC | PRN

## 2023-10-31 MED ORDER — APIXABAN 2.5 MG PO TABS: 2.5000 mg | ORAL_TABLET | Freq: Two times a day (BID) | ORAL | 0 refills | Status: DC

## 2023-10-31 MED ORDER — CLINDAMYCIN HCL 300 MG PO CAPS: 300.0000 mg | ORAL_CAPSULE | Freq: Three times a day (TID) | ORAL | 0 refills | Status: DC

## 2023-10-31 MED ORDER — OXYCODONE-ACETAMINOPHEN 5-325 MG PO TABS: 1.0000 | ORAL_TABLET | Freq: Four times a day (QID) | ORAL | 0 refills | Status: DC | PRN

## 2023-11-05 ENCOUNTER — Ambulatory Visit (INDEPENDENT_AMBULATORY_CARE_PROVIDER_SITE_OTHER): Admitting: Podiatry

## 2023-11-05 ENCOUNTER — Other Ambulatory Visit: Payer: Self-pay | Admitting: Podiatry

## 2023-11-05 ENCOUNTER — Telehealth: Payer: Self-pay | Admitting: Podiatry

## 2023-11-05 ENCOUNTER — Ambulatory Visit (INDEPENDENT_AMBULATORY_CARE_PROVIDER_SITE_OTHER)

## 2023-11-05 VITALS — BP 131/68 | HR 69 | Temp 97.7°F

## 2023-11-05 DIAGNOSIS — M21619 Bunion of unspecified foot: Secondary | ICD-10-CM

## 2023-11-05 DIAGNOSIS — M21611 Bunion of right foot: Secondary | ICD-10-CM

## 2023-11-05 MED ORDER — JOURNAVX 50 MG PO TABS: 1.0000 | ORAL_TABLET | Freq: Two times a day (BID) | ORAL | 0 refills | Status: DC

## 2023-11-05 MED ORDER — OXYCODONE-ACETAMINOPHEN 5-325 MG PO TABS: 1.0000 | ORAL_TABLET | Freq: Four times a day (QID) | ORAL | 0 refills | Status: DC | PRN

## 2023-11-05 NOTE — Telephone Encounter (Signed)
 The medication you prescribed is not covered by insurance. Please advise of an alterative.

## 2023-11-05 NOTE — Progress Notes (Unsigned)
 Patient presents for post-op visit today, POV # 1 DOS 10/31/23 RT FOOT BUNION CORRECTION( [place] POSSIBLE [place]) 2ND HAMMERTOE REPAIR W/ SCREW,WIRE FIXATION  Has not been to the bathroom since surgery. Has tried milk of mag, miralax, oatmeal. No bowel movement. Other than that has had pain, but that was expected. I know the oxy can cause constipation. Not much appetite, but when I do eat I am nauseous..   Vital Signs: Today's Vitals   11/05/23 1058  BP: 131/68  Pulse: 69  Temp: 97.7 F (36.5 C)  TempSrc: Oral  PainSc: 6   PainLoc: Foot    Radiographs: [x]  Taken []  Not taken  Surgical Site Assessment:  - Dressing:  [x]  Minimal dry blood, intact []  Reinforced   [x]  Changed     -RN Notes: n/a  - Incision:  [x]  CDI (clean, dry, intact)  [x]  Mild erythema  []  Drainage noted   -RN Notes: n/a  - Swelling:  []  None  []  Mild  [x]  Moderate   []  Significant    - Bruising:  []  None  [x]  Present: medial aspect of foot and on toes.   - Sutures/staples:  [x]  Intact  []  Removed Today  [x]  Plan to remove at next visit    -Cast/Splint/Pins: []  None [x]  Intact  []  Removed []  Plan to remove at next visit []  Replaced  -Signs of infection:  [x]  None  []  Present - Describe: n/a  -DME:    [x]  AFW []  Surgical shoe []  Cast  []  Splint  -Walking status:  []  Full WB  []  Partial WB  [x]  NWB  -Utilizing device:  []  None []  Knee Scooter []  Crutches [x]  Wheelchair    DVT assessment:  [x]  Denies symptoms []  Chest pain/SOB []  Pain in calf/redness/warmth   Redressed DSD and ace wrap. Educated on signs of infection, proper dressing care, pain management, and weight bearing status. Patient will contact provider with any new or worsening symptoms. The provider assessed the patient today and reviewed instructions regarding plan of care.  -  Patient seen and evaluated.  Status post foot surgery with incisions well coapted without any evidence of dehiscence.  There is some edema present but there is no  cellulitis identified at this time.  There is no areas of fluctuation or crepitation.  Mild tenderness palpation at surgical site.  Toes are rectus.  Dressing reapplied.  Continue weightbearing although limited in cam boot, ice, elevation.  Recommended Colace or Senokot for bowel movements.  If no improvement on his note.  Will try to hold narcotic pain medication if needed.  Prescribed suzetrigine.  If not covered we will refill narcotic pain medication or take Tylenol  if not taking this.  Radiology: Multiple views obtained.  Hardware intact pain, acute factors.  No evidence of acute fracture noted.  No follow-ups on file.  Leah [name] DPM

## 2023-11-06 NOTE — Telephone Encounter (Signed)
 Called Pt to let her know, Thank you

## 2023-11-10 ENCOUNTER — Other Ambulatory Visit: Payer: Self-pay | Admitting: Podiatry

## 2023-11-13 ENCOUNTER — Telehealth: Payer: Self-pay

## 2023-11-13 ENCOUNTER — Encounter: Payer: Self-pay | Admitting: Family Medicine

## 2023-11-13 ENCOUNTER — Other Ambulatory Visit

## 2023-11-13 ENCOUNTER — Ambulatory Visit: Admitting: Family Medicine

## 2023-11-13 VITALS — BP 137/78 | HR 73 | Temp 98.1°F | Ht 64.0 in | Wt 177.0 lb

## 2023-11-13 DIAGNOSIS — R0981 Nasal congestion: Secondary | ICD-10-CM

## 2023-11-13 DIAGNOSIS — Z23 Encounter for immunization: Secondary | ICD-10-CM | POA: Diagnosis not present

## 2023-11-13 DIAGNOSIS — Z9889 Other specified postprocedural states: Secondary | ICD-10-CM | POA: Diagnosis not present

## 2023-11-13 DIAGNOSIS — I10 Essential (primary) hypertension: Secondary | ICD-10-CM

## 2023-11-13 DIAGNOSIS — E663 Overweight: Secondary | ICD-10-CM | POA: Diagnosis not present

## 2023-11-13 DIAGNOSIS — I499 Cardiac arrhythmia, unspecified: Secondary | ICD-10-CM | POA: Diagnosis not present

## 2023-11-13 MED ORDER — FLUTICASONE PROPIONATE 50 MCG/ACT NA SUSP: 2.0000 | Freq: Every day | NASAL | 6 refills | Status: AC

## 2023-11-13 NOTE — Telephone Encounter (Signed)
 Patient called and left a message - you did her surgery on 10/31/23. Her foot is numb and ice cold since yesterday. She is very concerned - she is asking why this is happening and what she can do to fix it. [phone]

## 2023-11-13 NOTE — Progress Notes (Signed)
 Subjective: CC: General Follow-up PCP: Jolinda Norene HERO, DO YEP:Inmnuyb Leah [name] is a 73 y.o. female presenting to clinic today for:  Patient has been compliant with newly changed antihypertensive, losartan , where her HCTZ was discontinued.  Reports no chest pain, shortness of breath.  She has report that when she had her bunion surgery she was told by one of the nurses that patient scared them for a minute because her heart rhythm was a little abnormal.  She is currently treated with Eliquis for DVT prevention.  She does not report any heart palpitations but does note her bilateral upper extremities felt a little funny prior to surgery.  No shortness of breath reported.  She is currently in a boot and has stitches in her right foot with plans to hopefully get the stitches removed in the next week or so.   ROS: Per HPI  Allergies  Allergen Reactions   Beef Allergy  Anaphylaxis   Pork Allergy  Anaphylaxis and Other (See Comments)   Bovine (Beef) Protein Other (See Comments)    Muscle aches   Codeine Nausea And Vomiting   Levaquin  [Levofloxacin ] Other (See Comments)    Achilles tendon pain   Meat [Alpha-Gal] Other (See Comments)    Muscle Aches   Penicillins Other (See Comments)    Does not know   Praluent [Alirocumab] Dermatitis    Rash and myalgias   Strawberry (Diagnostic) Itching   Strawberry Extract Itching and Other (See Comments)   Past Medical History:  Diagnosis Date   Allergy     Allergy  to alpha-gal    Anxiety    Arthritis    Asthma    CAD (coronary artery disease)    Colon polyp    COVID-19 virus infection 05/2020   Depression    Elevated CK    Food allergy     GERD (gastroesophageal reflux disease)    Hyperlipidemia    Hypertension    Mild tricuspid stenosis    Renal cell carcinoma (HCC)    Urticaria     Current Outpatient Medications:    albuterol  (PROAIR  HFA) 108 (90 Base) MCG/ACT inhaler, Inhale 2 puffs into the lungs every 4 (four) hours as  needed for wheezing or shortness of breath., Disp: 8.5 g, Rfl: 3   Albuterol  Sulfate 2.5 MG/0.5ML NEBU, Take 0.5 mLs (2.5 mg total) by nebulization every 6 (six) hours as needed., Disp: 20 mL, Rfl: 3   apixaban (ELIQUIS) 2.5 MG TABS tablet, Take 1 tablet (2.5 mg total) by mouth 2 (two) times daily., Disp: 28 tablet, Rfl: 0   calcium  carbonate (TUMS) 500 MG chewable tablet, Chew 1 tablet (200 mg of elemental calcium  total) by mouth as needed for indigestion or heartburn., Disp: , Rfl:    Cholecalciferol (VITAMIN D ) 2000 UNITS tablet, Take 2,000 Units by mouth daily., Disp: , Rfl:    clindamycin (CLEOCIN) 300 MG capsule, Take 1 capsule (300 mg total) by mouth 3 (three) times daily., Disp: 9 capsule, Rfl: 0   famotidine  (PEPCID ) 40 MG tablet, Take 1 tablet (40 mg total) by mouth daily., Disp: 90 tablet, Rfl: 4   fish oil-omega-3 fatty acids  1000 MG capsule, Take 2 g by mouth daily. , Disp: , Rfl:    losartan  (COZAAR ) 100 MG tablet, Take 1 tablet (100 mg total) by mouth daily. To replace Losartan /HCTZ, Disp: 90 tablet, Rfl: 4   metFORMIN  (GLUCOPHAGE -XR) 500 MG 24 hr tablet, Take 1 tablet (500 mg total) by mouth daily with breakfast., Disp: 90 tablet, Rfl: 4  Multiple Vitamins-Minerals (EMERGEN-C IMMUNE PO), Take by mouth., Disp: , Rfl:    ondansetron  (ZOFRAN ) 4 MG tablet, Take 1 tablet (4 mg total) by mouth every 8 (eight) hours as needed for nausea or vomiting., Disp: 20 tablet, Rfl: 0   Polyvinyl Alcohol-Povidone (REFRESH OP), Apply to eye., Disp: , Rfl:    rosuvastatin  (CRESTOR ) 10 MG tablet, TAKE 1 TABLET BY MOUTH DAILY, Disp: 90 tablet, Rfl: 1   fluticasone  (FLONASE ) 50 MCG/ACT nasal spray, Place 2 sprays into both nostrils daily., Disp: 16 g, Rfl: 6   oxyCODONE-acetaminophen  (PERCOCET/ROXICET) 5-325 MG tablet, Take 1-2 tablets by mouth every 6 (six) hours as needed for severe pain (pain score 7-10). (Patient not taking: Reported on 11/13/2023), Disp: 15 tablet, Rfl: 0   Suzetrigine (JOURNAVX) 50  MG TABS, Take 1 tablet by mouth 2 (two) times daily. Take two 50mg  tablets as loading dose then 1 tablet every 12 hours (Patient not taking: Reported on 11/13/2023), Disp: 20 tablet, Rfl: 0   triamcinolone  cream (KENALOG ) 0.1 %, Apply 1 Application topically 2 (two) times daily. (Patient not taking: Reported on 11/13/2023), Disp: 30 g, Rfl: 0 Social History   Socioeconomic History   Marital status: Divorced    Spouse name: Not on file   Number of children: 3   Years of education: 12+   Highest education level: Some college, no degree  Occupational History   Occupation: private duty engineer, manufacturing systems  Tobacco Use   Smoking status: Former    Current packs/day: 0.00    Average packs/day: 0.5 packs/day for 28.0 years (14.0 ttl pk-yrs)    Types: Cigarettes    Start date: 01/17/1968    Quit date: 01/17/1996    Years since quitting: 27.8   Smokeless tobacco: Never  Vaping Use   Vaping status: Never Used  Substance and Sexual Activity   Alcohol use: Yes    Alcohol/week: 2.0 standard drinks of alcohol    Types: 2 Glasses of wine per week    Comment: occasional use of wine    Drug use: No   Sexual activity: Not Currently    Partners: Male    Birth control/protection: Surgical  Other Topics Concern   Not on file  Social History Narrative   Lives alone.    Social Drivers of Corporate Investment Banker Strain: Low Risk  (08/14/2023)   Overall Financial Resource Strain (CARDIA)    Difficulty of Paying Living Expenses: Not hard at all  Food Insecurity: No Food Insecurity (08/14/2023)   Hunger Vital Sign    Worried About Running Out of Food in the Last Year: Never true    Ran Out of Food in the Last Year: Never true  Transportation Needs: No Transportation Needs (08/14/2023)   PRAPARE - Administrator, Civil Service (Medical): No    Lack of Transportation (Non-Medical): No  Physical Activity: Insufficiently Active (08/11/2022)   Exercise Vital Sign    Days of Exercise per Week: 3  days    Minutes of Exercise per Session: 30 min  Stress: No Stress Concern Present (08/14/2023)   [employer] of Occupational Health - Occupational Stress Questionnaire    Feeling of Stress: Not at all  Social Connections: Moderately Integrated (08/14/2023)   Social Connection and Isolation Panel    Frequency of Communication with Friends and Family: More than three times a week    Frequency of Social Gatherings with Friends and Family: More than three times a week    Attends Religious  Services: More than 4 times per year    Active Member of Clubs or Organizations: Yes    Attends Banker Meetings: More than 4 times per year    Marital Status: Divorced  Intimate Partner Violence: Not At Risk (08/14/2023)   Humiliation, Afraid, Rape, and Kick questionnaire    Fear of Current or Ex-Partner: No    Emotionally Abused: No    Physically Abused: No    Sexually Abused: No   Family History  Problem Relation Age of Onset   Heart disease Mother    Alzheimer's disease Mother    Diabetes Mother    Other Father        deceased age 56 from some sort of GI problems but no malignancy   Diabetes Sister    Asthma Sister    Colon polyps Sister    Leukemia Brother        CLL   Acute lymphoblastic leukemia Brother    Diabetes Brother    Diabetes Daughter    Myasthenia gravis Daughter    Pulmonary embolism Daughter    Healthy Son    Healthy Son    Cancer Cousin        pancreatic   Cancer Cousin        pancreatic   Colon cancer Neg Hx    Stomach cancer Neg Hx    Esophageal cancer Neg Hx    Rectal cancer Neg Hx     Objective: Office vital signs reviewed. BP 137/78   Pulse 73   Temp 98.1 F (36.7 C)   Ht 5' 4 (1.626 m)   Wt 177 lb (80.3 kg)   SpO2 99%   BMI 30.38 kg/m   Physical Examination:  General: Awake, alert, well nourished, No acute distress HEENT: sclera white, MMM Cardio: regular rate and rhythm, occasional extra beat but no overt arrhythmia, S1S2 heard,  no murmurs appreciated Pulm: clear to auscultation bilaterally, no wheezes, rhonchi or rales; normal work of breathing on room air MSK: Right leg in a cam boot.  Assessment/ Plan: 73 y.o. female   Hypertension, benign essential, goal below 140/90  Overweight (BMI 25.0-29.9)  S/P bunionectomy  Cardiac arrhythmia, unspecified cardiac arrhythmia type - Plan: LONG TERM MONITOR XT (3-14 DAYS)  Nasal congestion - Plan: fluticasone  (FLONASE ) 50 MCG/ACT nasal spray   Blood pressure well-controlled on reduced regimen.  No changes  I do not think BMI is accurate as she has a cam walker in place and this was not removed for weight  Seems to be healing well left bunionectomy.  Will evaluate this suspected cardiac arrhythmia further with Zio patch.  This was placed today  Did not discuss nasal congestion but Flonase  needed refills   Jakori Burkett CHRISTELLA Fielding, DO [facility] [phone]

## 2023-11-14 ENCOUNTER — Ambulatory Visit (INDEPENDENT_AMBULATORY_CARE_PROVIDER_SITE_OTHER): Admitting: Podiatry

## 2023-11-14 ENCOUNTER — Telehealth: Payer: Self-pay

## 2023-11-14 VITALS — BP 148/82 | HR 67 | Temp 97.2°F

## 2023-11-14 DIAGNOSIS — M21619 Bunion of unspecified foot: Secondary | ICD-10-CM

## 2023-11-14 DIAGNOSIS — M2041 Other hammer toe(s) (acquired), right foot: Secondary | ICD-10-CM

## 2023-11-14 MED ORDER — PREGABALIN 50 MG PO CAPS: 50.0000 mg | ORAL_CAPSULE | Freq: Two times a day (BID) | ORAL | 0 refills | Status: DC

## 2023-11-14 NOTE — Progress Notes (Signed)
 Subjective: Chief Complaint  Patient presents with   Post-op Follow-up    POV # 2 DOS 10/31/23 RT FOOT BUNION CORRECTION( [place] POSSIBLE AIKEN) 2ND HAMMERTOE REPAIR W/ SCREW,WIRE FIXATION  Ice cold, feels like ice running through the foot. Tingly feeling. This all started 11/12/2023 pm, but worsened since then. Looks like the heel is white.   73 year old female presents the office with above concerns.  She states that her toes feel cold and like ice is running through her foot.  Feels like her foot fell asleep and started to wake up.  She also notes a white discoloration to her heel.  She did change the bandage last night.  Does not report any fevers or chills.  No injuries.  Objective: Vitals:   11/14/23 0917  BP: (!) 148/82  Pulse: 67  Temp: (!) 97.2 F (36.2 C)    AAO x3, NAD DP/PT pulses palpable bilaterally, CRT less than 3 seconds The incision is well coapted with sutures intact.  K wire intact without any drainage or pus.  There is no erythema or warmth.  Swelling actually appears to be improved compared to what it was last appointment.  There is no signs of infection noted today.  Toes are rectus.  She has movement to the toes.  There is good capillary refill to all the toes.  The foot clinically appears to be warm.  Slight white discoloration to the heel likely from maceration from the boot. No pain with calf compression, swelling, warmth, erythema    Assessment: Likely nerve pain  Plan: - All treatment options discussed with the patient including all alternatives, risks, complications.  - Today I left the sutures intact.  Foot looks good and has normal color and temperature.  She has strong pulses.  I do think is more nerve related.  Discussed gabapentin with Lyrica.  Monitor side effects.  - Continue cam boot.  Discussed the surgical shoe that she can use for short distances as well as the boot may be causing some issues.  - There is no signs of DVT at this time and  continue to monitor.  - Monitor for any clinical signs or symptoms of infection and directed to call the office immediately should any occur or go to the ER.  Return in about 1 week (around 11/21/2023) for POV/suture removal.   Leah [name] DPM

## 2023-11-14 NOTE — Telephone Encounter (Signed)
 PA request received from Arloa Prior for Pregabalin 50mg  capsule. PA submitted through CoverMyMeds and waiting on response.  NAOMIE HAMEL (KeyBETHA AGUAS) PA Case ID #: [id number] Rx #: [id number]

## 2023-11-15 ENCOUNTER — Encounter: Admitting: Podiatry

## 2023-11-15 NOTE — Telephone Encounter (Signed)
 PA was denied

## 2023-11-19 NOTE — Telephone Encounter (Signed)
 Redetermination was approved. Coverage from 01/17/23-01/16/24

## 2023-11-20 ENCOUNTER — Ambulatory Visit (INDEPENDENT_AMBULATORY_CARE_PROVIDER_SITE_OTHER): Admitting: Podiatry

## 2023-11-20 DIAGNOSIS — M21619 Bunion of unspecified foot: Secondary | ICD-10-CM

## 2023-11-20 NOTE — Progress Notes (Addendum)
 Patient presents for post-op visit today, DOS 10/31/23 RT FOOT BUNION CORRECTION( [place] POSSIBLE AIKEN) 2ND HAMMERTOE REPAIR W/ SCREW,WIRE FIXATION  Still a little tingly, but much better. Moving around some. Pain is a lot less. Taking Tylenol ..  RN Notes: n/a  Vital Signs: Today's Vitals   11/20/23 1052  PainSc: 0-No pain      Radiographs: []  Taken [x]  Not taken  Surgical Site Assessment:  - Dressing:  [x]  Minimal dry blood, intact []  Reinforced   []  Changed     -RN Notes: n/a  - Incision:  [x]  CDI (clean, dry, intact)  [x]  Mild erythema  []  Drainage noted   -RN Notes: n/a  - Swelling:  []  None  [x]  Mild  []  Moderate   []  Significant     -RN Notes: n/a  - Bruising:  [x]  None  []  Present: n/a   - Sutures/Staples:  []  None [x]  Intact  [x]  Removed Today  []  Plan to remove at next visit   -Cast/Splint/Pins: []  None [x]  Intact []  Removed Today [x]  Plan to remove at next visit []  Replaced  -Signs of infection:  [x]  None  []  Present - Describe: n/a  -DME:    []  None [x]  AFW []  Surgical shoe []  Cast  []  Splint  -Walking status:  []  Full WB  [x]  Partial WB  []  NWB  -Utilizing device:  []  None [x]  Knee Scooter []  Crutches []  Wheelchair    DVT assessment:  []  Denies symptoms [x]  Chest pain/SOB []  Pain in calf/redness/warmth    -RN Note: Shortness of breath on exertion  Redressed DSD and ace wrap. Educated on signs of infection, proper dressing care, pain management, and weight bearing status. Patient will contact provider with any new or worsening symptoms. The provider assessed the patient today and reviewed instructions regarding plan of care.  -  Patient seen and independently evaluated.  She states that she is feeling well.  No fevers or chills.  No chest pain.  No significant shortness of breath except when she exerts herself but this is intermittent and not on a regular basis.  Currently not experiencing any shortness of breath.  Overall she said that she feels much  better and reports that she is happy with how the foot looks. The cold sensation that she was getting previously is intermittent.   Incisions are healing well without any evidence of dehiscence.  There is no surrounding erythema, ascending size but is no drainage or pus.  There is decreased swelling noted.  There is no signs of infection.  Toes are rectus.  No significant pain on exam.  There is no pain with calf compression, erythema or warmth.  Sutures removed today.  Incision is healing well.  Dressing reapplied.  Continue cam boot.  She can weight-bear as tolerated in cam boot as she feels more comfortable.  Continue ice, elevate as well as compression.  Leah [name] DPM

## 2023-11-28 DIAGNOSIS — I498 Other specified cardiac arrhythmias: Secondary | ICD-10-CM | POA: Diagnosis not present

## 2023-11-29 ENCOUNTER — Encounter: Payer: Self-pay | Admitting: Podiatry

## 2023-11-29 ENCOUNTER — Ambulatory Visit: Payer: Self-pay | Admitting: Family Medicine

## 2023-11-29 ENCOUNTER — Ambulatory Visit (INDEPENDENT_AMBULATORY_CARE_PROVIDER_SITE_OTHER)

## 2023-11-29 ENCOUNTER — Ambulatory Visit (INDEPENDENT_AMBULATORY_CARE_PROVIDER_SITE_OTHER): Admitting: Podiatry

## 2023-11-29 VITALS — Ht 64.0 in | Wt 177.0 lb

## 2023-11-29 DIAGNOSIS — M21611 Bunion of right foot: Secondary | ICD-10-CM | POA: Diagnosis not present

## 2023-11-29 DIAGNOSIS — M2041 Other hammer toe(s) (acquired), right foot: Secondary | ICD-10-CM

## 2023-11-29 DIAGNOSIS — M21619 Bunion of unspecified foot: Secondary | ICD-10-CM

## 2023-11-29 NOTE — Progress Notes (Signed)
 Subjective: Chief Complaint  Patient presents with   Routine Post Op    POV # 3 DOS 10/31/23 RT FOOT BUNION CORRECTION( [place] POSSIBLE AIKEN) 2ND HAMMERTOE REPAIR W/ SCREW,WIRE FIXATION, pt states some pain, but not as intense as before.    73 year old female presents with the above concerns.  She said overall she is feeling better and she is not taking any pain medication at this time.  She is additionally taking Tylenol  yesterday.  She does not report any fevers or chills.  She has no other concerns today.  Objective: AAO x3, NAD DP/PT pulses palpable bilaterally, CRT less than 3 seconds Incisions healing well.  There is mild edema still present but overall improving.  Is no surrounding erythema, ascending cellulitis with no drainage approximately signs of infection.  There is no significant tenderness palpation on the surgical site.  K wire The second toe without any drainage or pus. No pain with calf compression, swelling, warmth, erythema  Assessment: Status post bunion, hammertoe repair, doing well  Plan: -All treatment options discussed with the patient including all alternatives, risks, complications.  -X-rays obtained reviewed.  Multiple views obtained.  No evidence of acute fracture.  Hardware intact but any complicating factors. -Incisions healing well.  Continue cam boot, ice, elevation as well as compression.  She will weight-bear as tolerated although limited. -Monitor for any clinical signs or symptoms of infection and directed to call the office immediately should any occur or go to the ER. -Patient encouraged to call the office with any questions, concerns, change in symptoms.   Return in about 2 weeks (around 12/13/2023) for x-ray, pin removal .  Leah [name] DPM

## 2023-12-11 ENCOUNTER — Encounter: Payer: Self-pay | Admitting: Podiatry

## 2023-12-11 ENCOUNTER — Ambulatory Visit (INDEPENDENT_AMBULATORY_CARE_PROVIDER_SITE_OTHER)

## 2023-12-11 ENCOUNTER — Ambulatory Visit: Admitting: Podiatry

## 2023-12-11 DIAGNOSIS — M21611 Bunion of right foot: Secondary | ICD-10-CM

## 2023-12-11 DIAGNOSIS — M2041 Other hammer toe(s) (acquired), right foot: Secondary | ICD-10-CM

## 2023-12-11 DIAGNOSIS — M21619 Bunion of unspecified foot: Secondary | ICD-10-CM

## 2023-12-11 DIAGNOSIS — R2681 Unsteadiness on feet: Secondary | ICD-10-CM

## 2023-12-11 NOTE — Progress Notes (Unsigned)
 Subjective: Chief Complaint  Patient presents with   Routine Post Op    pin removal Bunion right foot. Non diabetic A1C 6.1. 0 pain.   DOS 10/31/23 RT FOOT BUNION CORRECTION( [place] POSSIBLE AIKEN) 2ND HAMMERTOE REPAIR W/ SCREW,WIRE FIXATION   73 year old female presents with the above concerns.  She said overall she is feeling better and she is not taking any pain medication at this time.  She presents today for K wire removal.  She been walking in the cam boot with a cane.  She does not report any fevers or chills.  She is no other concerns.  Objective: AAO x3, NAD DP/PT pulses palpable bilaterally, CRT less than 3 seconds Incisions healing well.  There is mild edema still present but overall improving.  There is no surrounding erythema, ascending cellulitis with no drainage approximately signs of infection.  There is no significant tenderness palpation on the surgical site.  K wire intact to the second toe without any drainage or pus. No pain with calf compression, swelling, warmth, erythema  Assessment: Status post bunion, hammertoe repair, doing well  Plan: -All treatment options discussed with the patient including all alternatives, risks, complications.  -X-rays obtained reviewed.  Hardware intact uncomplicated factors.  Increased consolidation noted.  -K wire removed today in total without any complications and she tolerated well.  Antibiotic ointment was applied followed by dressing.  Continue daily dressing changes. -Continue ice, elevate. -Referral to physical therapy placed today.  As she feels more comfortable she can start to gradually transition to regular shoe as tolerated.  Return in about 3 weeks (around 01/01/2024).  X-ray next appointment  Leah [name] Fees DPM

## 2023-12-20 DIAGNOSIS — E785 Hyperlipidemia, unspecified: Secondary | ICD-10-CM | POA: Diagnosis not present

## 2023-12-20 DIAGNOSIS — K219 Gastro-esophageal reflux disease without esophagitis: Secondary | ICD-10-CM | POA: Diagnosis not present

## 2023-12-20 DIAGNOSIS — Z7984 Long term (current) use of oral hypoglycemic drugs: Secondary | ICD-10-CM | POA: Diagnosis not present

## 2023-12-20 DIAGNOSIS — I10 Essential (primary) hypertension: Secondary | ICD-10-CM | POA: Diagnosis not present

## 2023-12-20 DIAGNOSIS — J45909 Unspecified asthma, uncomplicated: Secondary | ICD-10-CM | POA: Diagnosis not present

## 2023-12-20 DIAGNOSIS — E119 Type 2 diabetes mellitus without complications: Secondary | ICD-10-CM | POA: Diagnosis not present

## 2023-12-21 ENCOUNTER — Ambulatory Visit: Admitting: Physical Therapy

## 2023-12-26 ENCOUNTER — Ambulatory Visit: Attending: Family Medicine | Admitting: Physical Therapy

## 2023-12-26 ENCOUNTER — Encounter: Payer: Self-pay | Admitting: Physical Therapy

## 2023-12-26 DIAGNOSIS — M79671 Pain in right foot: Secondary | ICD-10-CM

## 2023-12-26 DIAGNOSIS — R2681 Unsteadiness on feet: Secondary | ICD-10-CM | POA: Insufficient documentation

## 2023-12-26 DIAGNOSIS — R2689 Other abnormalities of gait and mobility: Secondary | ICD-10-CM

## 2023-12-26 DIAGNOSIS — M6281 Muscle weakness (generalized): Secondary | ICD-10-CM | POA: Diagnosis present

## 2023-12-26 NOTE — Therapy (Signed)
 OUTPATIENT PHYSICAL THERAPY EVALUATION/Discharge   Patient Name: Leah [name] MRN: [id number] DOB:03-29-1950, 73 y.o., female Today's Date: 12/26/2023  END OF SESSION:  PT End of Session - 12/26/23 1400     Visit Number 1    Number of Visits 1    PT Start Time 1330    PT Stop Time 1400    PT Time Calculation (min) 30 min    Activity Tolerance Patient tolerated treatment well          Past Medical History:  Diagnosis Date   Allergy     Allergy  to alpha-gal    Anxiety    Arthritis    Asthma    CAD (coronary artery disease)    Colon polyp    COVID-19 virus infection 05/2020   Depression    Elevated CK    Food allergy     GERD (gastroesophageal reflux disease)    Hyperlipidemia    Hypertension    Mild tricuspid stenosis    Renal cell carcinoma (HCC)    Urticaria    Past Surgical History:  Procedure Laterality Date   ABDOMINAL HYSTERECTOMY     Carpel Tunnel Right    CHOLECYSTECTOMY     COLONOSCOPY  02/08/2005   MFM:Pwuzmwjo hemorrhoids otherwise normal rectum/ A few scattered, shallow, left-sided sigmoid diverticula/The remainder of the colonic mucosa appeared normal   COLONOSCOPY WITH ESOPHAGOGASTRODUODENOSCOPY (EGD) N/A 11/29/2012   Procedure: COLONOSCOPY WITH ESOPHAGOGASTRODUODENOSCOPY (EGD);  Surgeon: Lamar CHRISTELLA Hollingshead, MD;  Location: [facility];  Service: Endoscopy;  Laterality: N/A;  10:45   HERNIA REPAIR     ventral X 2 with mesh, last one 2011 ([place]). complicated with 2 week hospital stay   HERNIA REPAIR     with mesh   NISSEN FUNDOPLICATION  1992   POLYPECTOMY     ROTATOR CUFF REPAIR Left 04/04/2019   TUBAL LIGATION     Patient Active Problem List   Diagnosis Date Noted   Elevated CK 11/18/2020   Pain in right shoulder 11/18/2020   Muscle cramps 11/18/2020   Myalgia 11/18/2020   Renal mass 08/17/2020   Bloating 07/02/2020   Flatulence 07/02/2020   Generalized abdominal pain 07/02/2020   Pelvic pain 06/15/2020   Cough 06/07/2020    CAD (coronary artery disease)    Hypertension    Tear of medial meniscus of knee 05/20/2020   COVID-19 05/2020   Hip pain 10/28/2019   Acute pain of right knee 10/28/2019   Right lower quadrant abdominal pain 10/28/2019   Internal hemorrhoids 04/11/2019   Constipation 04/11/2019   Rectal bleeding 07/13/2017   Arthropathy of left shoulder 07/13/2017   Hypokalemia 02/20/2017   Chest pain 01/17/2017   Adjustment disorder with mixed anxiety and depressed mood 03/25/2013   Epigastric pain 11/12/2012   Nausea alone 11/12/2012   History of colonic polyps 11/12/2012   Hypertension, benign essential, goal below 140/90 10/30/2012   Hyperlipidemia 10/22/2012   GERD (gastroesophageal reflux disease) 10/22/2012   Asthma 10/22/2012    PCP: Jolinda Norene CHRISTELLA, DO   REFERRING PROVIDER: Jolinda Norene CHRISTELLA, DO   REFERRING DIAG: R26.81 (ICD-10-CM) - Gait instability   Rationale for Evaluation and Treatment:  Rehabiliation  THERAPY DIAG:  Pain in right foot  Other abnormalities of gait and mobility  Muscle weakness (generalized)  ONSET DATE: DOS 10/31/23 RT FOOT BUNION CORRECTION( [place] POSSIBLE AIKEN) 2ND HAMMERTOE REPAIR W/ SCREW,WIRE FIXATION    SUBJECTIVE:  SUBJECTIVE STATEMENT: She had surgery to fix her 2 toes. She is now in regular shoe. She says she is feeling more back to normal and feels she does not need PT.  PERTINENT HISTORY:  See above PMH  PAIN:  NPRS scale: 1/10 upon arrival Pain location:Right big toe Pain description: intermittent Aggravating factors: walking too much Relieving factors: rest, stretching   PRECAUTIONS: ,  None  RED FLAGS: None   WEIGHT BEARING RESTRICTIONS:  No  FALLS:  Has patient fallen in last 6 months? No   PATIENT GOALS:  Get back to  normal  OBJECTIVE:  Note: Objective measures were completed at Evaluation unless otherwise noted.  PATIENT SURVEYS:  Patient-Specific Activity Scoring Scheme  0 represents unable to perform. 10 represents able to perform at prior level. 0 1 2 3 4 5 6 7 8 9  10 (Date and Score)   Activity Eval     1. walking 7    2.       3.     4.    5.    Score 7/10    Total score = sum of the activity scores/number of activities Minimum detectable change (90%CI) for average score = 2 points Minimum detectable change (90%CI) for single activity score = 3 points  GAIT: Assistive device utilized: None Level of assistance: Complete Independence Comments: mild antalgic gait    LOWER EXTREMITY ROM:   Right Foot and ankle WFL AROM noted following her surgery LOWER EXTREMITY MMT:  4+/5 MMT grossly Right LE                                                                                                                           TREATMENT DATE:  Eval HEP creation and review with demonstration and trial set preformed, see below for details   PATIENT EDUCATION: Education details: HEP, PT plan of care, selfcare Person educated: Patient Education method: Explanation, Demonstration, Verbal cues, and Handouts Education comprehension: verbalized understanding, further education recommended   HOME EXERCISE PROGRAM: Access Code: [email or web address] URL: [email or web address] Date: 12/26/2023 Prepared by: Redell Moose  Exercises - Gastroc Stretch on Wall  - 2 x daily - 6 x weekly - 3 sets - 30 hold - Seated Self Great Toe Stretch  - 2 x daily - 6 x weekly - 1 sets - 10 reps - 10 sec hold - Seated Toe Curl  - 2 x daily - 6 x weekly - 1 sets - 10 reps - 5 sec hold - Toe Spreading  - 2 x daily - 6 x weekly - 1 sets - 10 reps - 5 sec hold - Heel Toe Raises with Counter Support  - 2 x daily - 6 x weekly - 2 sets - 10 reps - Standing Tandem Balance with Counter Support  - 2 x daily - 6 x  weekly - 1 sets - 3 reps - 30 sec hold  ASSESSMENT:  CLINICAL IMPRESSION: Patient referred to PT for Evaluate  and Treat Status post bunionectomy and hammertoe repair 10/31/23. She has now transitioned to regular shoe. She does not feel she needs regular PT and she feels confident she can return to normal with home exercise program which was printed and reviewed with her today.   OBJECTIVE IMPAIRMENTS: decreased activity tolerance for ADL's, difficulty walking, decreased balance, decreased endurance, decreased mobility, decreased ROM, decreased strength, impaired flexibility, impaired, and pain.  ACTIVITY LIMITATIONS: bending, liftting, walking, standing, cleaning, community activity,  PERSONAL FACTORS: see above PMH  also affecting patient's functional outcome.  REHAB POTENTIAL: Good  CLINICAL DECISION MAKING: Stable/uncomplicated  EVALUATION COMPLEXITY: Low    GOALS: one time visit so goals to be written if she needs to return   PLAN: PT FREQUENCY: 1 time visit  PT DURATION:   PLANNED INTERVENTIONS  97110-Therapeutic exercises, 97530- Therapeutic activity, 97112- Neuromuscular re-education, 97535- Self Care, [id number]- Manual therapy, and Patient/Family education  PLAN FOR NEXT SESSION: She may return within 30 days if she feels she needs to, if no return in 30 days will discharge.   Redell JONELLE Moose, PT,DPT 12/26/2023, 2:01 PM

## 2023-12-28 ENCOUNTER — Ambulatory Visit: Admitting: Family

## 2023-12-28 ENCOUNTER — Encounter: Payer: Self-pay | Admitting: Family

## 2023-12-28 VITALS — BP 136/77 | HR 98 | Temp 97.9°F | Ht 64.0 in | Wt 176.8 lb

## 2023-12-28 DIAGNOSIS — J069 Acute upper respiratory infection, unspecified: Secondary | ICD-10-CM

## 2023-12-28 DIAGNOSIS — J4521 Mild intermittent asthma with (acute) exacerbation: Secondary | ICD-10-CM | POA: Diagnosis not present

## 2023-12-28 DIAGNOSIS — R6889 Other general symptoms and signs: Secondary | ICD-10-CM | POA: Diagnosis not present

## 2023-12-28 LAB — VERITOR SARS-COV-2 AND FLU A+B
BD Veritor SARS-CoV-2 Ag: NEGATIVE
Influenza A: NEGATIVE
Influenza B: NEGATIVE

## 2023-12-28 MED ORDER — BENZONATATE 200 MG PO CAPS: 200.0000 mg | ORAL_CAPSULE | Freq: Two times a day (BID) | ORAL | 0 refills | Status: DC | PRN

## 2023-12-28 MED ORDER — ALBUTEROL SULFATE HFA 108 (90 BASE) MCG/ACT IN AERS: 2.0000 | INHALATION_SPRAY | RESPIRATORY_TRACT | 0 refills | Status: AC | PRN

## 2023-12-28 MED ORDER — PROMETHAZINE-DM 6.25-15 MG/5ML PO SYRP: 5.0000 mL | ORAL_SOLUTION | Freq: Three times a day (TID) | ORAL | 0 refills | Status: DC | PRN

## 2023-12-28 MED ORDER — PREDNISONE 10 MG (21) PO TBPK: ORAL_TABLET | ORAL | 0 refills | Status: DC

## 2023-12-28 NOTE — Patient Instructions (Signed)

## 2023-12-28 NOTE — Progress Notes (Signed)
 Subjective:    Patient ID: Leah [name], female    DOB: [birth year], 73 y.o.   MRN: [id number]  Chief Complaint  Patient presents with   Generalized Body Aches   Fever   PT presents to the office today with body aches for 4 days, but yesterday started cough and fever.  Fever  Associated symptoms include congestion, coughing, headaches and wheezing. Pertinent negatives include no ear pain, nausea, sore throat or vomiting.  URI  This is a new problem. The current episode started in the past 7 days. The problem has been unchanged. There has been no fever. Associated symptoms include congestion, coughing, headaches, joint swelling, rhinorrhea, sinus pain, sneezing and wheezing. Pertinent negatives include no ear pain, nausea, sore throat or vomiting. She has tried increased fluids and acetaminophen  for the symptoms. The treatment provided mild relief.  Asthma She complains of cough, shortness of breath and wheezing. This is a chronic problem. The current episode started more than 1 year ago. The problem occurs intermittently. Associated symptoms include a fever, headaches, rhinorrhea and sneezing. Pertinent negatives include no ear pain or sore throat. Her past medical history is significant for asthma.      Review of Systems  Constitutional:  Positive for fever.  HENT:  Positive for congestion, rhinorrhea, sinus pain and sneezing. Negative for ear pain and sore throat.   Respiratory:  Positive for cough, shortness of breath and wheezing.   Gastrointestinal:  Negative for nausea and vomiting.  Neurological:  Positive for headaches.  All other systems reviewed and are negative.   Social History   Socioeconomic History   Marital status: Divorced    Spouse name: Not on file   Number of children: 3   Years of education: 12+   Highest education level: Some college, no degree  Occupational History   Occupation: private duty engineer, manufacturing systems  Tobacco Use   Smoking status: Former     Current packs/day: 0.00    Average packs/day: 0.5 packs/day for 28.0 years (14.0 ttl pk-yrs)    Types: Cigarettes    Start date: 01/17/1968    Quit date: 01/17/1996    Years since quitting: 27.9   Smokeless tobacco: Never  Vaping Use   Vaping status: Never Used  Substance and Sexual Activity   Alcohol use: Yes    Alcohol/week: 2.0 standard drinks of alcohol    Types: 2 Glasses of wine per week    Comment: occasional use of wine    Drug use: No   Sexual activity: Not Currently    Partners: Male    Birth control/protection: Surgical  Other Topics Concern   Not on file  Social History Narrative   Lives alone.    Social Drivers of Health   Tobacco Use: Medium Risk (12/28/2023)   Patient History    Smoking Tobacco Use: Former    Smokeless Tobacco Use: Never    Passive Exposure: Not on file  Financial Resource Strain: Low Risk (08/14/2023)   Overall Financial Resource Strain (CARDIA)    Difficulty of Paying Living Expenses: Not hard at all  Food Insecurity: No Food Insecurity (08/14/2023)   Epic    Worried About Programme Researcher, Broadcasting/film/video in the Last Year: Never true    Ran Out of Food in the Last Year: Never true  Transportation Needs: No Transportation Needs (08/14/2023)   Epic    Lack of Transportation (Medical): No    Lack of Transportation (Non-Medical): No  Physical Activity: Insufficiently Active (08/11/2022)  Exercise Vital Sign    Days of Exercise per Week: 3 days    Minutes of Exercise per Session: 30 min  Stress: No Stress Concern Present (08/14/2023)   [employer] of Occupational Health - Occupational Stress Questionnaire    Feeling of Stress: Not at all  Social Connections: Moderately Integrated (08/14/2023)   Social Connection and Isolation Panel    Frequency of Communication with Friends and Family: More than three times a week    Frequency of Social Gatherings with Friends and Family: More than three times a week    Attends Religious Services: More than 4 times per  year    Active Member of Clubs or Organizations: Yes    Attends Banker Meetings: More than 4 times per year    Marital Status: Divorced  Depression (PHQ2-9): Low Risk (11/13/2023)   Depression (PHQ2-9)    PHQ-2 Score: 0  Alcohol Screen: Low Risk (08/14/2023)   Alcohol Screen    Last Alcohol Screening Score (AUDIT): 0  Housing: Unknown (08/14/2023)   Epic    Unable to Pay for Housing in the Last Year: No    Number of Times Moved in the Last Year: Not on file    Homeless in the Last Year: No  Utilities: Not At Risk (08/14/2023)   Epic    Threatened with loss of utilities: No  Health Literacy: Adequate Health Literacy (08/14/2023)   B1300 Health Literacy    Frequency of need for help with medical instructions: Never   Family History  Problem Relation Age of Onset   Heart disease Mother    Alzheimer's disease Mother    Diabetes Mother    Other Father        deceased age 76 from some sort of GI problems but no malignancy   Diabetes Sister    Asthma Sister    Colon polyps Sister    Leukemia Brother        CLL   Acute lymphoblastic leukemia Brother    Diabetes Brother    Diabetes Daughter    Myasthenia gravis Daughter    Pulmonary embolism Daughter    Healthy Son    Healthy Son    Cancer Cousin        pancreatic   Cancer Cousin        pancreatic   Colon cancer Neg Hx    Stomach cancer Neg Hx    Esophageal cancer Neg Hx    Rectal cancer Neg Hx         Objective:   Physical Exam Vitals reviewed.  Constitutional:      General: She is not in acute distress.    Appearance: She is well-developed.  HENT:     Head: Normocephalic and atraumatic.     Right Ear: Tympanic membrane normal.     Left Ear: Tympanic membrane normal.  Eyes:     Pupils: Pupils are equal, round, and reactive to light.  Neck:     Thyroid : No thyromegaly.  Cardiovascular:     Rate and Rhythm: Normal rate and regular rhythm.     Heart sounds: Normal heart sounds. No murmur  heard. Pulmonary:     Effort: Pulmonary effort is normal. No respiratory distress.     Breath sounds: Normal breath sounds. No wheezing.     Comments: Coarse nonproductive cough Abdominal:     General: Bowel sounds are normal. There is no distension.     Palpations: Abdomen is soft.  Tenderness: There is no abdominal tenderness.  Musculoskeletal:        General: No tenderness. Normal range of motion.     Cervical back: Normal range of motion and neck supple.  Skin:    General: Skin is warm and dry.  Neurological:     Mental Status: She is alert and oriented to person, place, and time.     Cranial Nerves: No cranial nerve deficit.     Deep Tendon Reflexes: Reflexes are normal and symmetric.  Psychiatric:        Behavior: Behavior normal.        Thought Content: Thought content normal.        Judgment: Judgment normal.       BP 136/77   Pulse 98   Temp 97.9 F (36.6 C) (Temporal)   Ht 5' 4 (1.626 m)   Wt 176 lb 12.8 oz (80.2 kg)   BMI 30.35 kg/m      Assessment & Plan:  Leah [name] comes in today with chief complaint of Generalized Body Aches and Fever   Diagnosis and orders addressed:  1. Flu-like symptoms (Primary) - Veritor SARS-CoV-2 and Flu A+B  2. Mild intermittent asthma with exacerbation - albuterol  (PROAIR  HFA) 108 (90 Base) MCG/ACT inhaler; Inhale 2 puffs into the lungs every 4 (four) hours as needed for wheezing or shortness of breath.  Dispense: 8.5 g; Refill: 0  3. Viral URI - Take meds as prescribed - Use a cool mist humidifier  -Use saline nose sprays frequently -Force fluids -For any cough or congestion  Use plain Mucinex- regular strength or max strength is fine -For fever or aces or pains- take tylenol  or ibuprofen . -Throat lozenges if help -Follow up if symptoms worsen or do not improve  - albuterol  (PROAIR  HFA) 108 (90 Base) MCG/ACT inhaler; Inhale 2 puffs into the lungs every 4 (four) hours as needed for wheezing or shortness of  breath.  Dispense: 8.5 g; Refill: 0 - benzonatate  (TESSALON ) 200 MG capsule; Take 1 capsule (200 mg total) by mouth 2 (two) times daily as needed for cough.  Dispense: 20 capsule; Refill: 0 - promethazine-dextromethorphan (PROMETHAZINE-DM) 6.25-15 MG/5ML syrup; Take 5 mLs by mouth 3 (three) times daily as needed for cough.  Dispense: 118 mL; Refill: 0 - predniSONE  (STERAPRED UNI-PAK 21 TAB) 10 MG (21) TBPK tablet; Use as directed  Dispense: 21 tablet; Refill: 0     Bari Learn, FNP

## 2023-12-31 ENCOUNTER — Ambulatory Visit

## 2023-12-31 ENCOUNTER — Ambulatory Visit (INDEPENDENT_AMBULATORY_CARE_PROVIDER_SITE_OTHER): Admitting: Podiatry

## 2023-12-31 ENCOUNTER — Ambulatory Visit: Payer: Self-pay | Admitting: Family

## 2023-12-31 DIAGNOSIS — M21611 Bunion of right foot: Secondary | ICD-10-CM

## 2023-12-31 DIAGNOSIS — M21619 Bunion of unspecified foot: Secondary | ICD-10-CM

## 2023-12-31 DIAGNOSIS — M2041 Other hammer toe(s) (acquired), right foot: Secondary | ICD-10-CM

## 2023-12-31 NOTE — Progress Notes (Signed)
 Subjective: Chief Complaint  Patient presents with   Bunions    Pt stated that she is doing so much better she has no major concerns at this time     DOS 10/31/23 RT FOOT BUNION CORRECTION( [place] POSSIBLE AIKEN) 2ND HAMMERTOE REPAIR W/ SCREW,WIRE FIXATION   73 year old female presents with the above concerns.  She says she is doing very well and she is very happy the outcome of the surgery.  She is not having any pain to her foot.  She went to physical therapy wants but she did not feel that she needed to continue to go so she is doing therapy at home on her own.  She is asking about ways to prevent it from worsening of the left foot to avoid surgery.  She is notes her second toe starting to curl up over time on the left.    Objective: AAO x3, NAD DP/PT pulses palpable bilaterally, CRT less than 3 seconds Incisions healing well.  Still some slight edema present but overall improving.  There is no pain on the surgical sites.  There is decreased range of motion of the first MTPJ but there is no pain associated with this.  There is no crepitation with range of motion either.  There is no not causing any pain.  There is mild hammertoe contracture present of the left second toe as well as bunion deformity.  No significant pain. No pain with calf compression, swelling, warmth, erythema  Assessment: Status post bunion, hammertoe repair, doing well  Plan: -All treatment options discussed with the patient including all alternatives, risks, complications.  -X-rays obtained reviewed.  Hardware intact uncomplicated factors.  Increased consolidation noted.  There is no evidence of acute fracture. -At this time the right side she is doing well generally any issues and she is very happy the outcome.  We mutually agreed to discharge in the postoperative course.  If she has any concerns or questions to let us  know what is any changes. -On the left foot dispensed toe crest as well as offloading pads as well  as a toe spacer.  Continue supportive shoe gear and offloading.  Return if symptoms worsen or fail to improve.  Donnice JONELLE Fees DPM

## 2024-01-18 ENCOUNTER — Ambulatory Visit: Payer: Self-pay

## 2024-01-18 NOTE — Telephone Encounter (Signed)
 Patient only wants to see her PCP, Dr Jolinda.   FYI Only or Action Required?: Action required by provider: request for appointment.  Patient was last seen in primary care on 12/28/2023 by Lavell Bari LABOR, FNP.  Called Nurse Triage reporting Cough.  Symptoms began prior to visit on 12/28/2023 but coughing up small amount of blood started a week and a half ago and is not constant.  Interventions attempted: OTC medications: Tylenol , Prescription medications: Promethazine -Dextromethorphan syrup, Prednisone , Benzonatate , and Rest, hydration, or home remedies.  Symptoms are: overall better but has now had two episodes of coughing up a small amount of bright red blood (less than a teaspoon).  Triage Disposition: See PCP When Office is Open (Within 3 Days)  Patient/caregiver understands and will follow disposition?: No, wishes to speak with PCP          Copied from CRM #[id number]. Topic: Clinical - Red Word Triage >> Jan 18, 2024  2:09 PM Yolanda T wrote: Red Word that prompted transfer to Nurse Triage: patient called stated she had a cold a few weeks ago and she is still coughing but now she is coughing up red blood. Reason for Disposition  [1] Coughed up blood-tinged sputum AND [2] more than once  Answer Assessment - Initial Assessment Questions Patient seen in office 12/28/2023 and prescribed: Promethazine -Dextromethorphan syrup Benzonatate  (Tessalon ) 200mg  Prednisone  Patient has also taken Tylenol  (just two nights ago), drank ginger/honey/lemon tea,   Productive cough--sometimes appears to have red blood in it--bright red--this happened a week and a half ago and happened today as well. She states a small amount less than a teaspoon.  Patient states she feels better but not completely better.   Patient denies chest pain or difficulty breathing. Denies fevers, sore throat, ear aches. Patient has a history of asthma--patient hasn't used her rescue inhaler in over a  week.  Patient concerned about her A1c. She wanted to talk to her PCP about this.  Patient is advised that it is recommended she be seen and evaluated in the next three days. Patient is offered an appointment with another provider but she states she wants to see her PCP Dr Jolinda.  She is advised that her provider doesn't have any availability in the near future and she said she would like to only see her.  Patient also doesn't want to go to Urgent Care. She is advised that I will send this message to her PCP Dr Jolinda and let her know that she is requesting an appointment with her. Patient is advised to call us  back if anything changes or with any further questions/concerns. Patient is advised that if anything worsens to go to the Emergency Room. Patient verbalized understanding.  Protocols used: Cough - Acute Productive-A-AH

## 2024-01-18 NOTE — Telephone Encounter (Signed)
 Returned patient call, patient stated she has coughed up bright red blood twice in the past week. Patient stated that it is very little, not even 1/2 of a teaspoon. I advised patient not to wait till 1/26 to be seen. Patient was very addiment about only seeing Dr. Jolinda on 1/26. Appointment scheduled. Advised patient if she continues to cough up blood to go to the emergency room. Patient verbalized understanding.

## 2024-01-21 ENCOUNTER — Ambulatory Visit

## 2024-01-25 ENCOUNTER — Encounter: Payer: Self-pay | Admitting: Family Medicine

## 2024-01-25 ENCOUNTER — Ambulatory Visit

## 2024-01-25 ENCOUNTER — Ambulatory Visit: Admitting: Family Medicine

## 2024-01-25 VITALS — BP 154/84 | HR 78 | Temp 97.9°F | Ht 64.0 in | Wt 179.0 lb

## 2024-01-25 DIAGNOSIS — J019 Acute sinusitis, unspecified: Secondary | ICD-10-CM | POA: Diagnosis not present

## 2024-01-25 DIAGNOSIS — B9689 Other specified bacterial agents as the cause of diseases classified elsewhere: Secondary | ICD-10-CM | POA: Diagnosis not present

## 2024-01-25 DIAGNOSIS — R042 Hemoptysis: Secondary | ICD-10-CM | POA: Diagnosis not present

## 2024-01-25 MED ORDER — FLUCONAZOLE 150 MG PO TABS: 150.0000 mg | ORAL_TABLET | Freq: Once | ORAL | 0 refills | Status: AC

## 2024-01-25 MED ORDER — PROMETHAZINE-DM 6.25-15 MG/5ML PO SYRP: 5.0000 mL | ORAL_SOLUTION | Freq: Three times a day (TID) | ORAL | 0 refills | Status: AC | PRN

## 2024-01-25 MED ORDER — CEFDINIR 300 MG PO CAPS: 300.0000 mg | ORAL_CAPSULE | Freq: Two times a day (BID) | ORAL | 0 refills | Status: AC

## 2024-01-25 MED ORDER — BENZONATATE 200 MG PO CAPS: 200.0000 mg | ORAL_CAPSULE | Freq: Two times a day (BID) | ORAL | 0 refills | Status: AC | PRN

## 2024-01-25 NOTE — Progress Notes (Signed)
 "  Subjective: CC: URI, hemoptysis PCP: Jolinda Norene HERO, DO YEP:Inmnuyb Leah [name] is a 74 y.o. female presenting to clinic today for:  Patient was initially seen on 12/31/2023 for viral URI.  She had negative testing at that time was placed on cough medication, prednisone .  Symptoms have not improved and intact she is now coughing up some blood.  She notes this seems primarily be coming from the nose but sometimes she will actually cough up a little black specks.  Denies any shortness of breath, wheezing, fevers, chest pain etc.     ROS: Per HPI  Allergies[1] Past Medical History:  Diagnosis Date   Allergy     Allergy  to alpha-gal    Anxiety    Arthritis    Asthma    CAD (coronary artery disease)    Colon polyp    COVID-19 virus infection 05/2020   Depression    Elevated CK    Food allergy     GERD (gastroesophageal reflux disease)    Hyperlipidemia    Hypertension    Mild tricuspid stenosis    Renal cell carcinoma (HCC)    Urticaria    Current Medications[2] Social History   Socioeconomic History   Marital status: Divorced    Spouse name: Not on file   Number of children: 3   Years of education: 12+   Highest education level: Some college, no degree  Occupational History   Occupation: private duty engineer, manufacturing systems  Tobacco Use   Smoking status: Former    Current packs/day: 0.00    Average packs/day: 0.5 packs/day for 28.0 years (14.0 ttl pk-yrs)    Types: Cigarettes    Start date: 01/17/1968    Quit date: 01/17/1996    Years since quitting: 28.0   Smokeless tobacco: Never  Vaping Use   Vaping status: Never Used  Substance and Sexual Activity   Alcohol use: Yes    Alcohol/week: 2.0 standard drinks of alcohol    Types: 2 Glasses of wine per week    Comment: occasional use of wine    Drug use: No   Sexual activity: Not Currently    Partners: Male    Birth control/protection: Surgical  Other Topics Concern   Not on file  Social History Narrative   Lives  alone.    Social Drivers of Health   Tobacco Use: Medium Risk (12/28/2023)   Patient History    Smoking Tobacco Use: Former    Smokeless Tobacco Use: Never    Passive Exposure: Not on file  Financial Resource Strain: Low Risk (08/14/2023)   Overall Financial Resource Strain (CARDIA)    Difficulty of Paying Living Expenses: Not hard at all  Food Insecurity: No Food Insecurity (08/14/2023)   Epic    Worried About Programme Researcher, Broadcasting/film/video in the Last Year: Never true    Ran Out of Food in the Last Year: Never true  Transportation Needs: No Transportation Needs (08/14/2023)   Epic    Lack of Transportation (Medical): No    Lack of Transportation (Non-Medical): No  Physical Activity: Insufficiently Active (08/11/2022)   Exercise Vital Sign    Days of Exercise per Week: 3 days    Minutes of Exercise per Session: 30 min  Stress: No Stress Concern Present (08/14/2023)   [employer] of Occupational Health - Occupational Stress Questionnaire    Feeling of Stress: Not at all  Social Connections: Moderately Integrated (08/14/2023)   Social Connection and Isolation Panel    Frequency of Communication  with Friends and Family: More than three times a week    Frequency of Social Gatherings with Friends and Family: More than three times a week    Attends Religious Services: More than 4 times per year    Active Member of Clubs or Organizations: Yes    Attends Banker Meetings: More than 4 times per year    Marital Status: Divorced  Intimate Partner Violence: Not At Risk (08/14/2023)   Epic    Fear of Current or Ex-Partner: No    Emotionally Abused: No    Physically Abused: No    Sexually Abused: No  Depression (PHQ2-9): Low Risk (11/13/2023)   Depression (PHQ2-9)    PHQ-2 Score: 0  Alcohol Screen: Low Risk (08/14/2023)   Alcohol Screen    Last Alcohol Screening Score (AUDIT): 0  Housing: Unknown (08/14/2023)   Epic    Unable to Pay for Housing in the Last Year: No    Number of  Times Moved in the Last Year: Not on file    Homeless in the Last Year: No  Utilities: Not At Risk (08/14/2023)   Epic    Threatened with loss of utilities: No  Health Literacy: Adequate Health Literacy (08/14/2023)   B1300 Health Literacy    Frequency of need for help with medical instructions: Never   Family History  Problem Relation Age of Onset   Heart disease Mother    Alzheimer's disease Mother    Diabetes Mother    Other Father        deceased age 46 from some sort of GI problems but no malignancy   Diabetes Sister    Asthma Sister    Colon polyps Sister    Leukemia Brother        CLL   Acute lymphoblastic leukemia Brother    Diabetes Brother    Diabetes Daughter    Myasthenia gravis Daughter    Pulmonary embolism Daughter    Healthy Son    Healthy Son    Cancer Cousin        pancreatic   Cancer Cousin        pancreatic   Colon cancer Neg Hx    Stomach cancer Neg Hx    Esophageal cancer Neg Hx    Rectal cancer Neg Hx     Objective: Office vital signs reviewed. BP (!) 154/84   Pulse 78   Temp 97.9 F (36.6 C)   Ht 5' 4 (1.626 m)   Wt 179 lb (81.2 kg)   SpO2 100%   BMI 30.73 kg/m   Physical Examination:  General: Awake, alert, nontoxic-appearing female, No acute distress HEENT: Normal    Neck: No masses palpated.  Cervical lymphadenopathy appreciated    Ears: Tympanic membranes intact, normal light reflex, no erythema, no bulging    Eyes: PERRLA, extraocular membranes intact, sclera white    Nose: nasal turbinates moist, no appreciable bleeding or nasal discharge    Throat: moist mucus membranes, no erythema, no tonsillar exudate.  Airway is patent Cardio: regular rate and rhythm, S1S2 heard, no murmurs appreciated Pulm: clear to auscultation bilaterally, no wheezes, rhonchi or rales; normal work of breathing on room air   No results found.  Assessment/ Plan: 74 y.o. female   Acute bacterial sinusitis - Plan: cefdinir  (OMNICEF ) 300 MG capsule,  fluconazole  (DIFLUCAN ) 150 MG tablet, benzonatate  (TESSALON ) 200 MG capsule, promethazine -dextromethorphan (PROMETHAZINE -DM) 6.25-15 MG/5ML syrup  Hemoptysis - Plan: DG Chest 2 View, cefdinir  (OMNICEF ) 300 MG capsule,  fluconazole  (DIFLUCAN ) 150 MG tablet, benzonatate  (TESSALON ) 200 MG capsule, promethazine -dextromethorphan (PROMETHAZINE -DM) 6.25-15 MG/5ML syrup   I suspect this is acute bacterial sinusitis given worsening of symptoms and ongoing duration.  I suspect that the bleeding is actually coming from her sinus/nares.  I did go ahead and place chest x-ray order and personally reviewed it which demonstrated no acute infiltrates on my review but awaiting formal review by radiology.  I am going to treat her with oral antibiotics, Diflucan  as needed and I have renewed her cough medicines.  We discussed that if symptoms do not improve and or she develops any other red flag signs or symptoms, low threshold to have her reevaluated.  May consider referral to pulm if her URI symptoms resolve but she continues to have black sooty cough.   Norene CHRISTELLA Fielding, DO [facility] [phone]     [1]  Allergies Allergen Reactions   Beef Allergy  Anaphylaxis   Pork Allergy  Anaphylaxis and Other (See Comments)   Bovine (Beef) Protein Other (See Comments)    Muscle aches   Codeine Nausea And Vomiting   Levaquin  [Levofloxacin ] Other (See Comments)    Achilles tendon pain   Meat [Alpha-Gal] Other (See Comments)    Muscle Aches   Penicillins Other (See Comments)    Does not know   Praluent [Alirocumab] Dermatitis    Rash and myalgias   Strawberry (Diagnostic) Itching   Strawberry Extract Itching and Other (See Comments)  [2]  Current Outpatient Medications:    albuterol  (PROAIR  HFA) 108 (90 Base) MCG/ACT inhaler, Inhale 2 puffs into the lungs every 4 (four) hours as needed for wheezing or shortness of breath., Disp: 8.5 g, Rfl: 0   Albuterol  Sulfate 2.5 MG/0.5ML NEBU, Take  0.5 mLs (2.5 mg total) by nebulization every 6 (six) hours as needed., Disp: 20 mL, Rfl: 3   benzonatate  (TESSALON ) 200 MG capsule, Take 1 capsule (200 mg total) by mouth 2 (two) times daily as needed for cough., Disp: 20 capsule, Rfl: 0   calcium  carbonate (TUMS) 500 MG chewable tablet, Chew 1 tablet (200 mg of elemental calcium  total) by mouth as needed for indigestion or heartburn., Disp: , Rfl:    Cholecalciferol (VITAMIN D ) 2000 UNITS tablet, Take 2,000 Units by mouth daily., Disp: , Rfl:    famotidine  (PEPCID ) 40 MG tablet, Take 1 tablet (40 mg total) by mouth daily., Disp: 90 tablet, Rfl: 4   fish oil-omega-3 fatty acids  1000 MG capsule, Take 2 g by mouth daily. , Disp: , Rfl:    fluticasone  (FLONASE ) 50 MCG/ACT nasal spray, Place 2 sprays into both nostrils daily., Disp: 16 g, Rfl: 6   losartan  (COZAAR ) 100 MG tablet, Take 1 tablet (100 mg total) by mouth daily. To replace Losartan /HCTZ, Disp: 90 tablet, Rfl: 4   metFORMIN  (GLUCOPHAGE -XR) 500 MG 24 hr tablet, Take 1 tablet (500 mg total) by mouth daily with breakfast., Disp: 90 tablet, Rfl: 4   Multiple Vitamins-Minerals (EMERGEN-C IMMUNE PO), Take by mouth., Disp: , Rfl:    ondansetron  (ZOFRAN ) 4 MG tablet, Take 1 tablet (4 mg total) by mouth every 8 (eight) hours as needed for nausea or vomiting., Disp: 20 tablet, Rfl: 0   Polyvinyl Alcohol-Povidone (REFRESH OP), Apply to eye., Disp: , Rfl:    predniSONE  (STERAPRED UNI-PAK 21 TAB) 10 MG (21) TBPK tablet, Use as directed, Disp: 21 tablet, Rfl: 0   pregabalin  (LYRICA ) 50 MG capsule, Take 1 capsule (50 mg total) by mouth 2 (two) times  daily., Disp: 30 capsule, Rfl: 0   promethazine -dextromethorphan (PROMETHAZINE -DM) 6.25-15 MG/5ML syrup, Take 5 mLs by mouth 3 (three) times daily as needed for cough., Disp: 118 mL, Rfl: 0   rosuvastatin  (CRESTOR ) 10 MG tablet, TAKE 1 TABLET BY MOUTH DAILY, Disp: 90 tablet, Rfl: 1  "

## 2024-02-04 ENCOUNTER — Ambulatory Visit: Payer: Self-pay | Admitting: Family Medicine

## 2024-02-11 ENCOUNTER — Ambulatory Visit: Admitting: Family Medicine

## 2024-02-18 ENCOUNTER — Encounter: Payer: Self-pay | Admitting: *Deleted

## 2024-02-18 NOTE — Progress Notes (Signed)
 Leah [name]                                          MRN: [id number]   02/18/2024   The [facility] Quality Team Specialist reviewed this patient medical record for the purposes of chart review for care gap closure. The following were reviewed: chart review for care gap closure-glycemic status assessment.    [facility] Quality Team

## 2024-02-21 NOTE — Progress Notes (Signed)
 Leah [name]                                          MRN: [id number]   02/21/2024   The [facility] Quality Team Specialist reviewed this patient medical record for the purposes of chart review for care gap closure. The following were reviewed: chart review for care gap closure-kidney health evaluation for diabetes:eGFR  and uACR.    [facility] Quality Team

## 2024-08-14 ENCOUNTER — Ambulatory Visit: Payer: Self-pay
